# Patient Record
Sex: Male | Born: 1935
Health system: Southern US, Community
[De-identification: ages and names within clinical notes are randomized; demographics above are authoritative.]

## PROBLEM LIST (undated history)

## (undated) DIAGNOSIS — E785 Hyperlipidemia, unspecified: Secondary | ICD-10-CM

## (undated) DIAGNOSIS — I10 Essential (primary) hypertension: Secondary | ICD-10-CM

## (undated) DIAGNOSIS — H353220 Exudative age-related macular degeneration, left eye, stage unspecified: Secondary | ICD-10-CM

## (undated) DIAGNOSIS — M1990 Unspecified osteoarthritis, unspecified site: Secondary | ICD-10-CM

## (undated) DIAGNOSIS — Z01810 Encounter for preprocedural cardiovascular examination: Secondary | ICD-10-CM

## (undated) DIAGNOSIS — I714 Abdominal aortic aneurysm, without rupture, unspecified: Secondary | ICD-10-CM

## (undated) DIAGNOSIS — F528 Other sexual dysfunction not due to a substance or known physiological condition: Secondary | ICD-10-CM

## (undated) DIAGNOSIS — R42 Dizziness and giddiness: Secondary | ICD-10-CM

## (undated) DIAGNOSIS — K219 Gastro-esophageal reflux disease without esophagitis: Secondary | ICD-10-CM

## (undated) DIAGNOSIS — G4700 Insomnia, unspecified: Secondary | ICD-10-CM

## (undated) DIAGNOSIS — L720 Epidermal cyst: Secondary | ICD-10-CM

## (undated) DIAGNOSIS — I2510 Atherosclerotic heart disease of native coronary artery without angina pectoris: Secondary | ICD-10-CM

## (undated) DIAGNOSIS — Z09 Encounter for follow-up examination after completed treatment for conditions other than malignant neoplasm: Secondary | ICD-10-CM

## (undated) DIAGNOSIS — N529 Male erectile dysfunction, unspecified: Secondary | ICD-10-CM

## (undated) DIAGNOSIS — H409 Unspecified glaucoma: Secondary | ICD-10-CM

## (undated) DIAGNOSIS — I5022 Chronic systolic (congestive) heart failure: Secondary | ICD-10-CM

## (undated) DIAGNOSIS — I509 Heart failure, unspecified: Secondary | ICD-10-CM

## (undated) DIAGNOSIS — J189 Pneumonia, unspecified organism: Secondary | ICD-10-CM

## (undated) DIAGNOSIS — I255 Ischemic cardiomyopathy: Secondary | ICD-10-CM

## (undated) DIAGNOSIS — Z9189 Other specified personal risk factors, not elsewhere classified: Secondary | ICD-10-CM

## (undated) DIAGNOSIS — K4090 Unilateral inguinal hernia, without obstruction or gangrene, not specified as recurrent: Secondary | ICD-10-CM

## (undated) DIAGNOSIS — J310 Chronic rhinitis: Secondary | ICD-10-CM

## (undated) DIAGNOSIS — L309 Dermatitis, unspecified: Secondary | ICD-10-CM

## (undated) DIAGNOSIS — I219 Acute myocardial infarction, unspecified: Secondary | ICD-10-CM

## (undated) HISTORY — DX: Dermatitis, unspecified: L30.9

## (undated) HISTORY — PX: PTCA: SHX146

## (undated) HISTORY — DX: Acute myocardial infarction, unspecified: I21.9

## (undated) HISTORY — DX: Heart failure, unspecified: I50.9

## (undated) HISTORY — PX: CARDIAC CATHETERIZATION: SHX172

## (undated) HISTORY — DX: Dizziness and giddiness: R42

## (undated) HISTORY — DX: Atherosclerotic heart disease of native coronary artery without angina pectoris: I25.10

## (undated) HISTORY — DX: Unspecified osteoarthritis, unspecified site: M19.90

## (undated) HISTORY — DX: Hyperlipidemia, unspecified: E78.5

## (undated) HISTORY — DX: Abdominal aortic aneurysm, without rupture: I71.4

## (undated) HISTORY — DX: Unspecified glaucoma: H40.9

## (undated) HISTORY — DX: Gastro-esophageal reflux disease without esophagitis: K21.9

## (undated) HISTORY — DX: Encounter for follow-up examination after completed treatment for conditions other than malignant neoplasm: Z09

## (undated) HISTORY — DX: Other sexual dysfunction not due to a substance or known physiological condition: F52.8

## (undated) HISTORY — DX: Chronic systolic (congestive) heart failure: I50.22

## (undated) HISTORY — DX: Encounter for preprocedural cardiovascular examination: Z01.810

## (undated) HISTORY — DX: Essential (primary) hypertension: I10

## (undated) HISTORY — DX: Male erectile dysfunction, unspecified: N52.9

## (undated) HISTORY — DX: Chronic rhinitis: J31.0

## (undated) HISTORY — DX: Insomnia, unspecified: G47.00

## (undated) HISTORY — DX: Ischemic cardiomyopathy: I25.5

## (undated) HISTORY — DX: Abdominal aortic aneurysm, without rupture, unspecified: I71.40

## (undated) HISTORY — DX: Other specified personal risk factors, not elsewhere classified: Z91.89

## (undated) HISTORY — DX: Unilateral inguinal hernia, without obstruction or gangrene, not specified as recurrent: K40.90

## (undated) HISTORY — PX: CHOLECYSTECTOMY: SHX55

---

## 1983-11-20 HISTORY — PX: INGUINAL HERNIA REPAIR: SUR1180

## 1987-11-20 DIAGNOSIS — I219 Acute myocardial infarction, unspecified: Secondary | ICD-10-CM

## 1987-11-20 HISTORY — DX: Acute myocardial infarction, unspecified: I21.9

## 2001-09-24 ENCOUNTER — Encounter (HOSPITAL_COMMUNITY): Admission: RE | Admit: 2001-09-24 | Discharge: 2001-12-23 | Payer: Self-pay | Admitting: Family Medicine

## 2001-09-25 ENCOUNTER — Encounter: Payer: Self-pay | Admitting: Internal Medicine

## 2001-09-25 ENCOUNTER — Inpatient Hospital Stay (HOSPITAL_COMMUNITY): Admission: EM | Admit: 2001-09-25 | Discharge: 2001-09-30 | Payer: Self-pay | Admitting: Family Medicine

## 2001-09-27 ENCOUNTER — Encounter: Payer: Self-pay | Admitting: Internal Medicine

## 2001-09-29 ENCOUNTER — Encounter: Payer: Self-pay | Admitting: Internal Medicine

## 2001-11-19 HISTORY — PX: OTHER SURGICAL HISTORY: SHX169

## 2001-11-20 ENCOUNTER — Encounter: Payer: Self-pay | Admitting: Family Medicine

## 2001-11-20 ENCOUNTER — Ambulatory Visit (HOSPITAL_COMMUNITY): Admission: RE | Admit: 2001-11-20 | Discharge: 2001-11-20 | Payer: Self-pay | Admitting: Family Medicine

## 2002-01-28 ENCOUNTER — Ambulatory Visit (HOSPITAL_COMMUNITY): Admission: RE | Admit: 2002-01-28 | Discharge: 2002-01-28 | Payer: Self-pay | Admitting: Family Medicine

## 2002-01-28 ENCOUNTER — Encounter: Payer: Self-pay | Admitting: Family Medicine

## 2002-02-23 ENCOUNTER — Ambulatory Visit (HOSPITAL_COMMUNITY): Admission: RE | Admit: 2002-02-23 | Discharge: 2002-02-23 | Payer: Self-pay | Admitting: Vascular Surgery

## 2002-02-23 ENCOUNTER — Encounter: Payer: Self-pay | Admitting: Vascular Surgery

## 2002-02-25 ENCOUNTER — Encounter: Payer: Self-pay | Admitting: Vascular Surgery

## 2002-02-25 ENCOUNTER — Encounter: Admission: RE | Admit: 2002-02-25 | Discharge: 2002-02-25 | Payer: Self-pay | Admitting: Vascular Surgery

## 2002-03-05 ENCOUNTER — Encounter: Admission: RE | Admit: 2002-03-05 | Discharge: 2002-03-05 | Payer: Self-pay | Admitting: Vascular Surgery

## 2002-03-05 ENCOUNTER — Encounter: Payer: Self-pay | Admitting: Vascular Surgery

## 2002-03-17 ENCOUNTER — Inpatient Hospital Stay (HOSPITAL_COMMUNITY): Admission: RE | Admit: 2002-03-17 | Discharge: 2002-03-22 | Payer: Self-pay | Admitting: Vascular Surgery

## 2002-03-17 ENCOUNTER — Encounter: Payer: Self-pay | Admitting: Vascular Surgery

## 2002-03-17 ENCOUNTER — Encounter (INDEPENDENT_AMBULATORY_CARE_PROVIDER_SITE_OTHER): Payer: Self-pay | Admitting: *Deleted

## 2002-03-18 ENCOUNTER — Encounter: Payer: Self-pay | Admitting: Vascular Surgery

## 2004-09-27 ENCOUNTER — Ambulatory Visit: Payer: Self-pay | Admitting: Internal Medicine

## 2004-12-19 ENCOUNTER — Emergency Department (HOSPITAL_COMMUNITY): Admission: EM | Admit: 2004-12-19 | Discharge: 2004-12-19 | Payer: Self-pay | Admitting: Emergency Medicine

## 2004-12-25 ENCOUNTER — Ambulatory Visit: Payer: Self-pay | Admitting: Family Medicine

## 2005-01-01 ENCOUNTER — Ambulatory Visit: Payer: Self-pay | Admitting: Family Medicine

## 2005-01-18 ENCOUNTER — Ambulatory Visit: Payer: Self-pay | Admitting: Family Medicine

## 2005-01-31 ENCOUNTER — Ambulatory Visit: Payer: Self-pay | Admitting: Family Medicine

## 2005-07-11 ENCOUNTER — Ambulatory Visit: Payer: Self-pay | Admitting: Internal Medicine

## 2005-07-20 ENCOUNTER — Emergency Department (HOSPITAL_COMMUNITY): Admission: EM | Admit: 2005-07-20 | Discharge: 2005-07-20 | Payer: Self-pay | Admitting: Emergency Medicine

## 2005-07-25 ENCOUNTER — Ambulatory Visit: Payer: Self-pay | Admitting: Internal Medicine

## 2005-07-27 ENCOUNTER — Ambulatory Visit: Payer: Self-pay | Admitting: Internal Medicine

## 2005-07-30 ENCOUNTER — Ambulatory Visit: Payer: Self-pay | Admitting: Internal Medicine

## 2005-12-10 ENCOUNTER — Ambulatory Visit: Payer: Self-pay | Admitting: Internal Medicine

## 2006-04-22 ENCOUNTER — Ambulatory Visit: Payer: Self-pay | Admitting: Internal Medicine

## 2006-05-02 ENCOUNTER — Encounter: Payer: Self-pay | Admitting: Internal Medicine

## 2006-05-20 ENCOUNTER — Ambulatory Visit: Payer: Self-pay | Admitting: Internal Medicine

## 2006-08-26 ENCOUNTER — Ambulatory Visit: Payer: Self-pay | Admitting: Internal Medicine

## 2006-08-27 ENCOUNTER — Ambulatory Visit: Payer: Self-pay | Admitting: Internal Medicine

## 2006-09-30 ENCOUNTER — Ambulatory Visit: Payer: Self-pay | Admitting: Internal Medicine

## 2006-11-28 ENCOUNTER — Ambulatory Visit: Payer: Self-pay | Admitting: Internal Medicine

## 2006-11-28 LAB — CONVERTED CEMR LAB: PSA: 0.94 ng/mL (ref 0.10–4.00)

## 2007-01-14 ENCOUNTER — Ambulatory Visit: Payer: Self-pay | Admitting: Internal Medicine

## 2007-03-11 ENCOUNTER — Ambulatory Visit: Payer: Self-pay | Admitting: Internal Medicine

## 2007-04-03 ENCOUNTER — Encounter: Payer: Self-pay | Admitting: Internal Medicine

## 2007-04-03 ENCOUNTER — Encounter (INDEPENDENT_AMBULATORY_CARE_PROVIDER_SITE_OTHER): Payer: Self-pay | Admitting: *Deleted

## 2007-04-03 LAB — CONVERTED CEMR LAB
OCCULT 1: NEGATIVE
OCCULT 2: NEGATIVE
OCCULT 3: NEGATIVE

## 2007-04-12 ENCOUNTER — Ambulatory Visit: Payer: Self-pay | Admitting: Internal Medicine

## 2007-04-24 ENCOUNTER — Telehealth (INDEPENDENT_AMBULATORY_CARE_PROVIDER_SITE_OTHER): Payer: Self-pay | Admitting: *Deleted

## 2007-04-30 DIAGNOSIS — I10 Essential (primary) hypertension: Secondary | ICD-10-CM | POA: Insufficient documentation

## 2007-04-30 DIAGNOSIS — I509 Heart failure, unspecified: Secondary | ICD-10-CM | POA: Insufficient documentation

## 2007-04-30 HISTORY — DX: Essential (primary) hypertension: I10

## 2007-05-06 ENCOUNTER — Ambulatory Visit: Payer: Self-pay | Admitting: Internal Medicine

## 2007-05-06 DIAGNOSIS — E785 Hyperlipidemia, unspecified: Secondary | ICD-10-CM

## 2007-05-06 DIAGNOSIS — Z9889 Other specified postprocedural states: Secondary | ICD-10-CM | POA: Insufficient documentation

## 2007-05-06 HISTORY — DX: Hyperlipidemia, unspecified: E78.5

## 2007-06-09 ENCOUNTER — Ambulatory Visit: Payer: Self-pay | Admitting: Internal Medicine

## 2007-06-09 DIAGNOSIS — I2510 Atherosclerotic heart disease of native coronary artery without angina pectoris: Secondary | ICD-10-CM | POA: Insufficient documentation

## 2007-06-09 HISTORY — DX: Atherosclerotic heart disease of native coronary artery without angina pectoris: I25.10

## 2007-06-12 LAB — CONVERTED CEMR LAB
ALT: 18 units/L (ref 0–53)
AST: 22 units/L (ref 0–37)
BUN: 8 mg/dL (ref 6–23)
CO2: 33 meq/L — ABNORMAL HIGH (ref 19–32)
Calcium: 8.8 mg/dL (ref 8.4–10.5)
Chloride: 101 meq/L (ref 96–112)
Cholesterol: 177 mg/dL (ref 0–200)
Creatinine, Ser: 1 mg/dL (ref 0.4–1.5)
Direct LDL: 96.4 mg/dL
GFR calc Af Amer: 93 mL/min
GFR calc non Af Amer: 77 mL/min
Glucose, Bld: 109 mg/dL — ABNORMAL HIGH (ref 70–99)
HDL: 28.6 mg/dL — ABNORMAL LOW (ref >39.0)
Hemoglobin: 14.6 g/dL (ref 13.0–17.0)
Potassium: 3.5 meq/L (ref 3.5–5.1)
Sodium: 140 meq/L (ref 135–145)
Total CHOL/HDL Ratio: 6.2
Triglycerides: 251 mg/dL (ref 0–149)
VLDL: 50 mg/dL — ABNORMAL HIGH (ref 0–40)

## 2007-06-25 ENCOUNTER — Ambulatory Visit: Payer: Self-pay | Admitting: Cardiology

## 2007-06-27 ENCOUNTER — Encounter: Payer: Self-pay | Admitting: Internal Medicine

## 2007-07-23 ENCOUNTER — Ambulatory Visit: Payer: Self-pay

## 2007-07-23 ENCOUNTER — Encounter: Payer: Self-pay | Admitting: Internal Medicine

## 2007-12-09 ENCOUNTER — Ambulatory Visit: Payer: Self-pay | Admitting: Internal Medicine

## 2007-12-09 DIAGNOSIS — F528 Other sexual dysfunction not due to a substance or known physiological condition: Secondary | ICD-10-CM | POA: Insufficient documentation

## 2007-12-09 HISTORY — DX: Other sexual dysfunction not due to a substance or known physiological condition: F52.8

## 2007-12-10 ENCOUNTER — Telehealth (INDEPENDENT_AMBULATORY_CARE_PROVIDER_SITE_OTHER): Payer: Self-pay | Admitting: *Deleted

## 2007-12-10 LAB — CONVERTED CEMR LAB
ALT: 18 units/L (ref 0–53)
AST: 19 units/L (ref 0–37)
BUN: 8 mg/dL (ref 6–23)
Basophils Absolute: 0.1 10*3/uL (ref 0.0–0.1)
Basophils Relative: 1.1 % — ABNORMAL HIGH (ref 0.0–1.0)
CO2: 30 meq/L (ref 19–32)
Calcium: 9.1 mg/dL (ref 8.4–10.5)
Chloride: 98 meq/L (ref 96–112)
Cholesterol: 156 mg/dL (ref 0–200)
Creatinine, Ser: 1.1 mg/dL (ref 0.4–1.5)
Direct LDL: 81.5 mg/dL
Eosinophils Absolute: 0.2 10*3/uL (ref 0.0–0.6)
Eosinophils Relative: 4.7 % (ref 0.0–5.0)
GFR calc Af Amer: 83 mL/min
GFR calc non Af Amer: 68 mL/min
Glucose, Bld: 104 mg/dL — ABNORMAL HIGH (ref 70–99)
HCT: 42.8 % (ref 39.0–52.0)
HDL: 31.3 mg/dL — ABNORMAL LOW (ref >39.0)
Hemoglobin: 14.6 g/dL (ref 13.0–17.0)
Lymphocytes Relative: 30.8 % (ref 12.0–46.0)
MCHC: 34 g/dL (ref 30.0–36.0)
MCV: 95.7 fL (ref 78.0–100.0)
Monocytes Absolute: 0.6 10*3/uL (ref 0.2–0.7)
Monocytes Relative: 12.6 % — ABNORMAL HIGH (ref 3.0–11.0)
Neutro Abs: 2.5 10*3/uL (ref 1.4–7.7)
Neutrophils Relative %: 50.8 % (ref 43.0–77.0)
Platelets: 183 10*3/uL (ref 150–400)
Potassium: 3.5 meq/L (ref 3.5–5.1)
RBC: 4.47 M/uL (ref 4.22–5.81)
RDW: 13.3 % (ref 11.5–14.6)
Sodium: 138 meq/L (ref 135–145)
Total CHOL/HDL Ratio: 5
Triglycerides: 224 mg/dL (ref 0–149)
VLDL: 45 mg/dL — ABNORMAL HIGH (ref 0–40)
WBC: 4.9 10*3/uL (ref 4.5–10.5)

## 2008-03-23 ENCOUNTER — Telehealth: Payer: Self-pay | Admitting: Internal Medicine

## 2008-03-23 ENCOUNTER — Emergency Department (HOSPITAL_COMMUNITY): Admission: EM | Admit: 2008-03-23 | Discharge: 2008-03-23 | Payer: Self-pay | Admitting: Emergency Medicine

## 2008-03-30 ENCOUNTER — Encounter: Admission: RE | Admit: 2008-03-30 | Discharge: 2008-03-30 | Payer: Self-pay | Admitting: Specialist

## 2008-04-20 ENCOUNTER — Ambulatory Visit: Payer: Self-pay | Admitting: Internal Medicine

## 2008-04-20 DIAGNOSIS — J310 Chronic rhinitis: Secondary | ICD-10-CM

## 2008-04-20 DIAGNOSIS — R42 Dizziness and giddiness: Secondary | ICD-10-CM | POA: Insufficient documentation

## 2008-04-20 HISTORY — DX: Chronic rhinitis: J31.0

## 2008-07-15 ENCOUNTER — Ambulatory Visit: Payer: Self-pay | Admitting: Cardiology

## 2008-07-21 ENCOUNTER — Ambulatory Visit: Payer: Self-pay | Admitting: Internal Medicine

## 2008-07-21 DIAGNOSIS — K219 Gastro-esophageal reflux disease without esophagitis: Secondary | ICD-10-CM

## 2008-07-21 HISTORY — DX: Gastro-esophageal reflux disease without esophagitis: K21.9

## 2008-07-23 ENCOUNTER — Telehealth (INDEPENDENT_AMBULATORY_CARE_PROVIDER_SITE_OTHER): Payer: Self-pay | Admitting: *Deleted

## 2008-07-23 LAB — CONVERTED CEMR LAB
ALT: 17 units/L (ref 0–53)
AST: 21 units/L (ref 0–37)
BUN: 9 mg/dL (ref 6–23)
CO2: 31 meq/L (ref 19–32)
Calcium: 9 mg/dL (ref 8.4–10.5)
Chloride: 104 meq/L (ref 96–112)
Cholesterol: 130 mg/dL (ref 0–200)
Creatinine, Ser: 0.9 mg/dL (ref 0.4–1.5)
GFR calc Af Amer: 104 mL/min
GFR calc non Af Amer: 86 mL/min
Glucose, Bld: 101 mg/dL — ABNORMAL HIGH (ref 70–99)
HDL: 29.8 mg/dL — ABNORMAL LOW (ref >39.0)
LDL Cholesterol: 62 mg/dL (ref 0–99)
Potassium: 3.1 meq/L — ABNORMAL LOW (ref 3.5–5.1)
Sodium: 140 meq/L (ref 135–145)
TSH: 1.39 microintl units/mL (ref 0.35–5.50)
Total CHOL/HDL Ratio: 4.4
Triglycerides: 189 mg/dL — ABNORMAL HIGH (ref 0–149)
VLDL: 38 mg/dL (ref 0–40)

## 2008-08-06 ENCOUNTER — Ambulatory Visit: Payer: Self-pay | Admitting: Internal Medicine

## 2008-08-06 ENCOUNTER — Telehealth (INDEPENDENT_AMBULATORY_CARE_PROVIDER_SITE_OTHER): Payer: Self-pay | Admitting: *Deleted

## 2008-08-06 LAB — CONVERTED CEMR LAB: Potassium: 3 meq/L — ABNORMAL LOW (ref 3.5–5.1)

## 2008-08-11 ENCOUNTER — Ambulatory Visit: Payer: Self-pay | Admitting: Internal Medicine

## 2008-08-11 ENCOUNTER — Encounter (INDEPENDENT_AMBULATORY_CARE_PROVIDER_SITE_OTHER): Payer: Self-pay | Admitting: *Deleted

## 2008-08-11 LAB — CONVERTED CEMR LAB
OCCULT 1: NEGATIVE
OCCULT 2: NEGATIVE
OCCULT 3: NEGATIVE

## 2008-09-16 ENCOUNTER — Ambulatory Visit: Payer: Self-pay | Admitting: Internal Medicine

## 2008-09-21 ENCOUNTER — Encounter (INDEPENDENT_AMBULATORY_CARE_PROVIDER_SITE_OTHER): Payer: Self-pay | Admitting: *Deleted

## 2008-09-21 LAB — CONVERTED CEMR LAB: Potassium: 3.4 meq/L — ABNORMAL LOW (ref 3.5–5.1)

## 2008-09-23 ENCOUNTER — Telehealth (INDEPENDENT_AMBULATORY_CARE_PROVIDER_SITE_OTHER): Payer: Self-pay | Admitting: *Deleted

## 2008-11-29 ENCOUNTER — Telehealth: Payer: Self-pay | Admitting: Internal Medicine

## 2009-01-12 ENCOUNTER — Ambulatory Visit: Payer: Self-pay | Admitting: Internal Medicine

## 2009-01-18 LAB — CONVERTED CEMR LAB
BUN: 11 mg/dL (ref 6–23)
Basophils Absolute: 0 10*3/uL (ref 0.0–0.1)
Basophils Relative: 0.5 % (ref 0.0–3.0)
CO2: 30 meq/L (ref 19–32)
Calcium: 9.2 mg/dL (ref 8.4–10.5)
Chloride: 96 meq/L (ref 96–112)
Creatinine, Ser: 0.9 mg/dL (ref 0.4–1.5)
Eosinophils Absolute: 0.2 10*3/uL (ref 0.0–0.7)
Eosinophils Relative: 3.8 % (ref 0.0–5.0)
GFR calc Af Amer: 104 mL/min
GFR calc non Af Amer: 86 mL/min
Glucose, Bld: 92 mg/dL (ref 70–99)
HCT: 41.6 % (ref 39.0–52.0)
Hemoglobin: 14.3 g/dL (ref 13.0–17.0)
Lymphocytes Relative: 32.3 % (ref 12.0–46.0)
MCHC: 34.4 g/dL (ref 30.0–36.0)
MCV: 95.7 fL (ref 78.0–100.0)
Monocytes Absolute: 0.8 10*3/uL (ref 0.1–1.0)
Monocytes Relative: 13.8 % — ABNORMAL HIGH (ref 3.0–12.0)
Neutro Abs: 2.8 10*3/uL (ref 1.4–7.7)
Neutrophils Relative %: 49.6 % (ref 43.0–77.0)
PSA: 0.94 ng/mL (ref 0.10–4.00)
Platelets: 220 10*3/uL (ref 150–400)
Potassium: 3.5 meq/L (ref 3.5–5.1)
RBC: 4.35 M/uL (ref 4.22–5.81)
RDW: 13.4 % (ref 11.5–14.6)
Sodium: 136 meq/L (ref 135–145)
WBC: 5.6 10*3/uL (ref 4.5–10.5)

## 2009-06-10 ENCOUNTER — Encounter (INDEPENDENT_AMBULATORY_CARE_PROVIDER_SITE_OTHER): Payer: Self-pay | Admitting: *Deleted

## 2009-07-07 ENCOUNTER — Ambulatory Visit: Payer: Self-pay | Admitting: Cardiology

## 2009-07-07 DIAGNOSIS — I739 Peripheral vascular disease, unspecified: Secondary | ICD-10-CM | POA: Insufficient documentation

## 2009-07-07 DIAGNOSIS — I714 Abdominal aortic aneurysm, without rupture, unspecified: Secondary | ICD-10-CM | POA: Insufficient documentation

## 2009-07-07 DIAGNOSIS — I252 Old myocardial infarction: Secondary | ICD-10-CM | POA: Insufficient documentation

## 2009-07-12 ENCOUNTER — Ambulatory Visit: Payer: Self-pay | Admitting: Internal Medicine

## 2009-07-18 ENCOUNTER — Ambulatory Visit: Payer: Self-pay | Admitting: Internal Medicine

## 2009-07-22 ENCOUNTER — Ambulatory Visit: Payer: Self-pay

## 2009-07-22 ENCOUNTER — Encounter: Payer: Self-pay | Admitting: Cardiology

## 2009-08-04 LAB — CONVERTED CEMR LAB
ALT: 14 units/L (ref 0–53)
AST: 19 units/L (ref 0–37)
Albumin: 3.8 g/dL (ref 3.5–5.2)
Alkaline Phosphatase: 37 units/L — ABNORMAL LOW (ref 39–117)
Bilirubin, Direct: 0 mg/dL (ref 0.0–0.3)
Cholesterol: 123 mg/dL (ref 0–200)
HDL: 30.2 mg/dL — ABNORMAL LOW (ref >39.00)
LDL Cholesterol: 68 mg/dL (ref 0–99)
Total Bilirubin: 0.8 mg/dL (ref 0.3–1.2)
Total CHOL/HDL Ratio: 4
Total Protein: 6.6 g/dL (ref 6.0–8.3)
Triglycerides: 122 mg/dL (ref 0.0–149.0)
VLDL: 24.4 mg/dL (ref 0.0–40.0)

## 2009-10-05 ENCOUNTER — Telehealth: Payer: Self-pay | Admitting: Internal Medicine

## 2009-11-02 ENCOUNTER — Ambulatory Visit: Payer: Self-pay | Admitting: Internal Medicine

## 2009-11-08 LAB — CONVERTED CEMR LAB
BUN: 13 mg/dL (ref 6–23)
CO2: 30 meq/L (ref 19–32)
Calcium: 8.8 mg/dL (ref 8.4–10.5)
Chloride: 97 meq/L (ref 96–112)
Creatinine, Ser: 1.2 mg/dL (ref 0.4–1.5)
GFR calc non Af Amer: 61.59 mL/min (ref >60)
Glucose, Bld: 81 mg/dL (ref 70–99)
Potassium: 2.9 meq/L — ABNORMAL LOW (ref 3.5–5.1)
Sodium: 136 meq/L (ref 135–145)
Total CK: 123 units/L (ref 7–232)

## 2009-11-22 ENCOUNTER — Telehealth (INDEPENDENT_AMBULATORY_CARE_PROVIDER_SITE_OTHER): Payer: Self-pay | Admitting: *Deleted

## 2009-12-07 ENCOUNTER — Ambulatory Visit: Payer: Self-pay | Admitting: Internal Medicine

## 2009-12-07 LAB — CONVERTED CEMR LAB
BUN: 12 mg/dL (ref 6–23)
CO2: 31 meq/L (ref 19–32)
Calcium: 9.1 mg/dL (ref 8.4–10.5)
Chloride: 100 meq/L (ref 96–112)
Creatinine, Ser: 1.1 mg/dL (ref 0.4–1.5)
GFR calc non Af Amer: 68.08 mL/min (ref >60)
Glucose, Bld: 119 mg/dL — ABNORMAL HIGH (ref 70–99)
Potassium: 2.9 meq/L — ABNORMAL LOW (ref 3.5–5.1)
Sodium: 139 meq/L (ref 135–145)

## 2009-12-13 ENCOUNTER — Telehealth: Payer: Self-pay | Admitting: Internal Medicine

## 2009-12-22 ENCOUNTER — Ambulatory Visit: Payer: Self-pay | Admitting: Internal Medicine

## 2009-12-26 LAB — CONVERTED CEMR LAB
BUN: 8 mg/dL (ref 6–23)
CO2: 28 meq/L (ref 19–32)
Calcium: 8.9 mg/dL (ref 8.4–10.5)
Chloride: 105 meq/L (ref 96–112)
Creatinine, Ser: 1 mg/dL (ref 0.4–1.5)
GFR calc non Af Amer: 75.99 mL/min (ref >60)
Glucose, Bld: 96 mg/dL (ref 70–99)
Potassium: 3.4 meq/L — ABNORMAL LOW (ref 3.5–5.1)
Sodium: 139 meq/L (ref 135–145)

## 2009-12-30 ENCOUNTER — Telehealth (INDEPENDENT_AMBULATORY_CARE_PROVIDER_SITE_OTHER): Payer: Self-pay | Admitting: *Deleted

## 2010-03-17 ENCOUNTER — Ambulatory Visit: Payer: Self-pay | Admitting: Internal Medicine

## 2010-03-17 DIAGNOSIS — M1990 Unspecified osteoarthritis, unspecified site: Secondary | ICD-10-CM

## 2010-03-17 HISTORY — DX: Unspecified osteoarthritis, unspecified site: M19.90

## 2010-03-21 ENCOUNTER — Telehealth (INDEPENDENT_AMBULATORY_CARE_PROVIDER_SITE_OTHER): Payer: Self-pay | Admitting: *Deleted

## 2010-03-22 ENCOUNTER — Encounter: Payer: Self-pay | Admitting: Internal Medicine

## 2010-03-23 ENCOUNTER — Encounter: Payer: Self-pay | Admitting: Internal Medicine

## 2010-03-23 ENCOUNTER — Ambulatory Visit: Payer: Self-pay

## 2010-03-24 ENCOUNTER — Encounter (INDEPENDENT_AMBULATORY_CARE_PROVIDER_SITE_OTHER): Payer: Self-pay | Admitting: *Deleted

## 2010-03-24 ENCOUNTER — Ambulatory Visit: Payer: Self-pay | Admitting: Internal Medicine

## 2010-03-24 LAB — CONVERTED CEMR LAB: Fecal Occult Bld: NEGATIVE

## 2010-03-29 ENCOUNTER — Ambulatory Visit: Payer: Self-pay | Admitting: Cardiology

## 2010-04-03 ENCOUNTER — Telehealth (INDEPENDENT_AMBULATORY_CARE_PROVIDER_SITE_OTHER): Payer: Self-pay | Admitting: Radiology

## 2010-06-01 ENCOUNTER — Telehealth (INDEPENDENT_AMBULATORY_CARE_PROVIDER_SITE_OTHER): Payer: Self-pay | Admitting: *Deleted

## 2010-06-07 ENCOUNTER — Telehealth (INDEPENDENT_AMBULATORY_CARE_PROVIDER_SITE_OTHER): Payer: Self-pay | Admitting: *Deleted

## 2010-06-08 ENCOUNTER — Encounter: Payer: Self-pay | Admitting: Cardiology

## 2010-06-08 ENCOUNTER — Ambulatory Visit: Payer: Self-pay

## 2010-06-08 ENCOUNTER — Ambulatory Visit: Payer: Self-pay | Admitting: Cardiology

## 2010-06-08 ENCOUNTER — Encounter (HOSPITAL_COMMUNITY): Admission: RE | Admit: 2010-06-08 | Discharge: 2010-08-14 | Payer: Self-pay | Admitting: Cardiology

## 2010-07-21 ENCOUNTER — Ambulatory Visit: Payer: Self-pay | Admitting: Internal Medicine

## 2010-07-27 ENCOUNTER — Telehealth (INDEPENDENT_AMBULATORY_CARE_PROVIDER_SITE_OTHER): Payer: Self-pay | Admitting: *Deleted

## 2010-11-22 ENCOUNTER — Other Ambulatory Visit: Payer: Self-pay | Admitting: Internal Medicine

## 2010-11-22 ENCOUNTER — Ambulatory Visit
Admission: RE | Admit: 2010-11-22 | Discharge: 2010-11-22 | Payer: Self-pay | Source: Home / Self Care | Attending: Internal Medicine | Admitting: Internal Medicine

## 2010-11-22 LAB — BASIC METABOLIC PANEL
BUN: 12 mg/dL (ref 6–23)
CO2: 32 mEq/L (ref 19–32)
Calcium: 9 mg/dL (ref 8.4–10.5)
Chloride: 98 mEq/L (ref 96–112)
Creatinine, Ser: 1 mg/dL (ref 0.4–1.5)
GFR: 74.95 mL/min (ref >60.00)
Glucose, Bld: 83 mg/dL (ref 70–99)
Potassium: 2.9 mEq/L — ABNORMAL LOW (ref 3.5–5.1)
Sodium: 138 mEq/L (ref 135–145)

## 2010-11-22 LAB — ALT: ALT: 15 U/L (ref 0–53)

## 2010-11-22 LAB — AST: AST: 17 U/L (ref 0–37)

## 2010-12-14 ENCOUNTER — Telehealth: Payer: Self-pay | Admitting: Internal Medicine

## 2010-12-15 ENCOUNTER — Ambulatory Visit
Admission: RE | Admit: 2010-12-15 | Discharge: 2010-12-15 | Payer: Self-pay | Source: Home / Self Care | Attending: Internal Medicine | Admitting: Internal Medicine

## 2010-12-15 ENCOUNTER — Other Ambulatory Visit: Payer: Self-pay | Admitting: Internal Medicine

## 2010-12-15 LAB — POTASSIUM: Potassium: 3.9 mEq/L (ref 3.5–5.1)

## 2010-12-17 LAB — CONVERTED CEMR LAB
ALT: 17 units/L (ref 0–53)
AST: 19 units/L (ref 0–37)
BUN: 8 mg/dL (ref 6–23)
Basophils Absolute: 0 10*3/uL (ref 0.0–0.1)
Basophils Relative: 0.9 % (ref 0.0–3.0)
CO2: 30 meq/L (ref 19–32)
Calcium: 8.9 mg/dL (ref 8.4–10.5)
Chloride: 105 meq/L (ref 96–112)
Cholesterol: 129 mg/dL (ref 0–200)
Creatinine, Ser: 1 mg/dL (ref 0.4–1.5)
Eosinophils Absolute: 0.2 10*3/uL (ref 0.0–0.7)
Eosinophils Relative: 4.7 % (ref 0.0–5.0)
GFR calc non Af Amer: 75.95 mL/min (ref >60)
Glucose, Bld: 96 mg/dL (ref 70–99)
HCT: 41.2 % (ref 39.0–52.0)
HDL: 36.9 mg/dL — ABNORMAL LOW (ref >39.00)
Hemoglobin: 14.1 g/dL (ref 13.0–17.0)
LDL Cholesterol: 60 mg/dL (ref 0–99)
Lymphocytes Relative: 21.9 % (ref 12.0–46.0)
Lymphs Abs: 1.1 10*3/uL (ref 0.7–4.0)
MCHC: 34.4 g/dL (ref 30.0–36.0)
MCV: 95.7 fL (ref 78.0–100.0)
Monocytes Absolute: 0.5 10*3/uL (ref 0.1–1.0)
Monocytes Relative: 10.5 % (ref 3.0–12.0)
Neutro Abs: 3.2 10*3/uL (ref 1.4–7.7)
Neutrophils Relative %: 62 % (ref 43.0–77.0)
PSA: 1.16 ng/mL (ref 0.10–4.00)
Platelets: 161 10*3/uL (ref 150.0–400.0)
Potassium: 3.9 meq/L (ref 3.5–5.1)
RBC: 4.3 M/uL (ref 4.22–5.81)
RDW: 14.5 % (ref 11.5–14.6)
Sodium: 142 meq/L (ref 135–145)
TSH: 1.91 microintl units/mL (ref 0.35–5.50)
Total CHOL/HDL Ratio: 3
Triglycerides: 161 mg/dL — ABNORMAL HIGH (ref 0.0–149.0)
VLDL: 32.2 mg/dL (ref 0.0–40.0)
WBC: 5.2 10*3/uL (ref 4.5–10.5)

## 2010-12-19 NOTE — Progress Notes (Signed)
Summary: Decklan Mau--rx  LORAZEPAM  Phone Note Refill Request   Refills Requested: Medication #1:  LORAZEPAM 0.5 MG  TABS 1 by mouth at bedtime [facility] [address]-[phone] fax--[phone]--  Initial call taken by: Freddy Jaksch,  November 29, 2008 9:27 AM  Follow-up for Phone Call        last ov 07/21/08, lorazepam on med list do not see where you last filled Follow-up by: Kandice Hams,  November 29, 2008 4:09 PM  Additional Follow-up for Phone Call Additional follow up Details #1::        if he has been on it x long time , ok to RF, 1 month supply, 3 RF Tomie Elko E. Sham Alviar MD  November 29, 2008 4:48 PM     Additional Follow-up for Phone Call Additional follow up Details #2::    SPoke to patient and stated : he had been taking it for sometime now.  Gave refills faxed to pharmacy. Ardyth Man  November 30, 2008 9:31 AM  Follow-up by: Ardyth Man,  November 30, 2008 9:31 AM    Prescriptions: LORAZEPAM 0.5 MG  TABS (LORAZEPAM) 1 by mouth at bedtime  #30 x 3   Entered by:   Ardyth Man   Authorized by:   Nolon Rod. Daylen Lipsky MD   Signed by:   Ardyth Man on 11/30/2008   Method used:   Printed then faxed to ...       [facility]  [phone]* (retail)       [address] [place], [place]  [address]       Ph: [phone] or [phone]       Fax: [phone]   RxID:   [id number]

## 2010-12-19 NOTE — Progress Notes (Signed)
Summary: labs  Phone Note Call from Patient   Caller: Patient-voicemail  Summary of Call: spoke with wife in regards to labs also informed copy mailed to patient  Initial call taken by: Doristine Devoid,  September 23, 2008 5:03 PM      Prescriptions: KLOR-CON M20 20 MEQ CR-TABS (POTASSIUM CHLORIDE CRYS CR) 1 by mouth once daily; recheck potassium in 4 weeks  #30 x 3   Entered by:   Doristine Devoid   Authorized by:   Nolon Rod. Paz MD   Signed by:   Doristine Devoid on 09/23/2008   Method used:   Electronically to        [facility]  [phone] (retail)       [address] [place], [place]  [address]       Ph: [phone] or [phone]       Fax: [phone]   RxID:   [id number]

## 2010-12-19 NOTE — Progress Notes (Signed)
Summary: left msg for pt   Phone Note Call from Patient   Caller: Spouse Summary of Call: called in ref to Klor-Con went to get flled and they are recommending Potassium Chloride, is it ok to use? Left msg for pt to call .Kandice Hams  December 30, 2009 1:08 PM Called pt got vm left msg klor-con, and potassium chloride the same   Initial call taken by: Kandice Hams,  December 30, 2009 1:08 PM

## 2010-12-19 NOTE — Assessment & Plan Note (Signed)
Summary: emp//pt will be fasting//lch   Vital Signs:  Patient profile:   77 year old male Height:      70 inches Weight:      169.8 pounds BMI:     24.45 Pulse rate:   60 / minute BP sitting:   110 / 60  Vitals Entered By: [name] (March 17, 2010 8:44 AM) CC: yearly, fasting, never had colonoscopy Comments Patient is not taing his amitriptyline, levitra, astepro, vicodin BP @ home 121/71, 125/70, 120/68, 113/63 [name]  March 17, 2010 8:51 AM    History of Present Illness: yearly checkup, chart review here with his wife  hyperlipidemia--good medication compliance w/ chol meds   CAD--  2 weeks ago had   CP, on and off,  lasted one day, located @ the anterior, mid chest with some discomfort in the throat. Not associated with shortness of breath, diaphoresis, nausea.  No further episodes.  The pain decreased after burping  Hypertension-- good ambulatory BPs   ED-- not on levitra    hypertension-- his diuretics were changed,   good ambulatory BPs  Preventive Screening-Counseling & Management  Alcohol-Tobacco     Year Quit: 1955  Caffeine-Diet-Exercise     Caffeine use/day: 12     Does Patient Exercise: yes     Type of exercise: walk,     Times/week: 2  Current Medications (verified): 1)  Omeprazole 20 Mg Cpdr (Omeprazole) .... Take 1 Capsule By Mouth At Bedtime 2)  Amlodipine Besylate 10 Mg Tabs (Amlodipine Besylate) .Marland Kitchen.. 1 By Mouth Once Daily 3)  Niaspan 500 Mg  Tbcr (Niacin (Antihyperlipidemic)) .... Take 1 At Bedtime 4)  Amitriptyline Hcl 10 Mg Tabs (Amitriptyline Hcl) .... Take 1 Tablet By Mouth At Bedtime 5)  Lorazepam 0.5 Mg  Tabs (Lorazepam) .Marland Kitchen.. 1 By Mouth At Bedtime 6)  One-Daily Multivitamins   Tabs (Multiple Vitamin) .Marland Kitchen.. 1 Tab Once Daily 7)  Bayer Low Strength 81 Mg  Tbec (Aspirin) .Marland Kitchen.. 1 Tab Once Daily 8)  Fish Oil 1000 Mg Caps (Omega-3 Fatty Acids) .Marland Kitchen.. 1 By Mouth Once Daily 9)  Simvastatin 20 Mg Tabs (Simvastatin) .... 1/2 By  Mouth Once Daily 10)  Ocuvite Preservision  Tabs (Multiple Vitamins-Minerals) .... Daily 11)  Atenolol 100 Mg Tabs (Atenolol) .Marland Kitchen.. 1 By Mouth Once Daily 12)  Furosemide 20 Mg Tabs (Furosemide) .Marland Kitchen.. 1 By Mouth Once Daily - 13)  Klor-Con M20 20 Meq Cr-Tabs (Potassium Chloride Crys Cr) .Marland Kitchen.. 1 By Mouth Once Daily - Stop Spironolactone - Labs Due in 1 Week  Allergies (verified): 1)  ! Spironolactone (Spironolactone) 2)  * Ace Inhibitors Group 3)  Aspirin (Aspirin)  Past History:  Past Medical History: CAD Congestive heart failure    Hypertension AAA-repaired 02/2002,  renal ultrasound, September 2010: Normal abdominal aorta, normal renal arteries Hyperlipidemia ED Allergic rhinitis GERD  Past Surgical History: Reviewed history from 06/18/2009 and no changes required. Cholecystectomy Inguinal herniorrhaphy AAA repair 2003  Family History: Reviewed history from 06/18/2009 and no changes required. Family History Lung cancer prostate cancer -- no heart disease--no diabetes--no colon cancer--no  Social History: Married 3 children, 2 live close by Former Smoker Alcohol use-no  [employer] veteran diet-- healthy exercise-- active if good weather  still drives  independent on his ADLDoes Patient Exercise:  yes Caffeine use/day:  12  Review of Systems Resp:  Denies cough and shortness of breath. GI:  Denies bloody stools, nausea, and vomiting. GU:  Denies dysuria, hematuria, urinary frequency, and urinary  hesitancy. MS:  having pain at the right hip area, ibuprofen p.r.n. helps.  Denies claudication per se.  Physical Exam  General:  alert, well-developed, and well-nourished.   Neck:  slight increase right carotid pulse?, normal left carotid pulse Lungs:  normal respiratory effort, no intercostal retractions, no accessory muscle use, and normal breath sounds.   Heart:  normal rate, regular rhythm, no murmur, and no gallop.   Abdomen:  soft, non-tender, no distention, no masses,  no guarding, and no rigidity.  no bruit Rectal:  large, nonbleeding external hemorrhoids noted. Normal sphincter tone. No rectal masses or tenderness. Hemoccult negative Prostate:  Prostate gland firm and smooth, no enlargement, nodularity, tenderness, mass, asymmetry or induration. Pulses:  good femoral  pulses bilaterally Extremities:  no LE edema   Impression & Recommendations:  Problem # 1:  HYPERTENSION (ICD-401.9)  Chlorthalidone was discontinued due to hypokalemia, he was prescribed   spironolactone but that was discontinued due to rash. Currently on Lasix, tolerating well good ambulatory BPs His updated medication list for this problem includes:    Amlodipine Besylate 10 Mg Tabs (Amlodipine besylate) .Marland Kitchen... 1 by mouth once daily    Atenolol 100 Mg Tabs (Atenolol) .Marland Kitchen... 1 by mouth once daily    Furosemide 20 Mg Tabs (Furosemide) .Marland Kitchen... 1 by mouth once daily -  BP today: 110/60 Prior BP: 100/60 (11/02/2009)  Labs Reviewed: K+: 3.4 (12/22/2009) Creat: : 1.0 (12/22/2009)   Chol: 123 (07/18/2009)   HDL: 30.20 (07/18/2009)   LDL: 68 (07/18/2009)   TG: 122.0 (07/18/2009)  Orders: Venipuncture (04540) TLB-BMP (Basic Metabolic Panel-BMET) (80048-METABOL) TLB-TSH (Thyroid Stimulating Hormone) (84443-TSH) TLB-CBC Platelet - w/Differential (85025-CBCD) EKG w/ Interpretation (93000)  Problem # 2:  HEALTH MAINTENANCE EXAM (ICD-V70.0) chart review. Td 2000 pneumonia shot 2007 printed material provided regards shingles shot   never Cscope Hemoccults negative 9/09 Colonoscopy Vs.iFOB cards reviewed w/ pt. Provided  iFOB but he will  call if he decides to have a  colonoscopy   Check a PSA      Problem # 3:  PERIPHERAL VASCULAR DISEASE (ICD-443.9)  question of decreased  pulse @  right carotid artery check ultrasound  Orders: Cardiology Referral (Cardiology)  Problem # 4:  CORONARY ARTERY DISEASE (ICD-414.00)  chest pain two weeks ago last objective evaluation of his heart  more than two years ago Will discuss with cardiology, likely will need a stress test EKG today no acute  ER if symptoms increase His updated medication list for this problem includes:    Amlodipine Besylate 10 Mg Tabs (Amlodipine besylate) .Marland Kitchen... 1 by mouth once daily    Bayer Low Strength 81 Mg Tbec (Aspirin) .Marland Kitchen... 1 tab once daily    Atenolol 100 Mg Tabs (Atenolol) .Marland Kitchen... 1 by mouth once daily    Furosemide 20 Mg Tabs (Furosemide) .Marland Kitchen... 1 by mouth once daily -  Problem # 5:  CONGESTIVE HEART FAILURE (ICD-428.0) no volume overload today His updated medication list for this problem includes:    Bayer Low Strength 81 Mg Tbec (Aspirin) .Marland Kitchen... 1 tab once daily    Atenolol 100 Mg Tabs (Atenolol) .Marland Kitchen... 1 by mouth once daily    Furosemide 20 Mg Tabs (Furosemide) .Marland Kitchen... 1 by mouth once daily -  Problem # 6:  DEGENERATIVE JOINT DISEASE (ICD-715.90) the patient has some pain around the right hip  he is taking ibuprofen, recommend to take Tylenol instead The following medications were removed from the medication list:    Vicodin 5-500 Mg Tabs (Hydrocodone-acetaminophen) .Marland KitchenMarland KitchenMarland KitchenMarland Kitchen 1  every 4 hours His updated medication list for this problem includes:    Bayer Low Strength 81 Mg Tbec (Aspirin) .Marland Kitchen... 1 tab once daily  Problem # 7:  HYPERLIPIDEMIA (ICD-272.4) labs  The following medications were removed from the medication list:    Lopid 600 Mg Tabs (Gemfibrozil) .Marland Kitchen... 1 tablet by mouth twice a day His updated medication list for this problem includes:    Niaspan 500 Mg Tbcr (Niacin (antihyperlipidemic)) .Marland Kitchen... Take 1 at bedtime    Simvastatin 10 Mg Tabs (Simvastatin) .Marland Kitchen... 1 by mouth once daily  Orders: TLB-ALT (SGPT) (84460-ALT) TLB-AST (SGOT) (84450-SGOT) TLB-Lipid Panel (80061-LIPID)  Labs Reviewed: SGOT: 19 (07/18/2009)   SGPT: 14 (07/18/2009)   HDL:30.20 (07/18/2009), 29.8 (07/21/2008)  LDL:68 (07/18/2009), 62 (07/21/2008)  Chol:123 (07/18/2009), 130 (07/21/2008)  Trig:122.0 (07/18/2009), 189  (07/21/2008)  Complete Medication List: 1)  Omeprazole 20 Mg Cpdr (Omeprazole) .... Take 1 capsule by mouth at bedtime 2)  Amlodipine Besylate 10 Mg Tabs (Amlodipine besylate) .Marland Kitchen.. 1 by mouth once daily 3)  Niaspan 500 Mg Tbcr (Niacin (antihyperlipidemic)) .... Take 1 at bedtime 4)  Amitriptyline Hcl 10 Mg Tabs (Amitriptyline hcl) .... Take 1 tablet by mouth at bedtime 5)  Lorazepam 0.5 Mg Tabs (Lorazepam) .Marland Kitchen.. 1 by mouth at bedtime 6)  One-daily Multivitamins Tabs (Multiple vitamin) .Marland Kitchen.. 1 tab once daily 7)  Bayer Low Strength 81 Mg Tbec (Aspirin) .Marland Kitchen.. 1 tab once daily 8)  Fish Oil 1000 Mg Caps (Omega-3 fatty acids) .Marland Kitchen.. 1 by mouth once daily 9)  Simvastatin 10 Mg Tabs (Simvastatin) .Marland Kitchen.. 1 by mouth once daily 10)  Ocuvite Preservision Tabs (Multiple vitamins-minerals) .... Daily 11)  Atenolol 100 Mg Tabs (Atenolol) .Marland Kitchen.. 1 by mouth once daily 12)  Furosemide 20 Mg Tabs (Furosemide) .Marland Kitchen.. 1 by mouth once daily - 13)  Klor-con M20 20 Meq Cr-tabs (Potassium chloride crys cr) .Marland Kitchen.. 1 by mouth once daily - stop spironolactone - labs due in 1 week  Other Orders: TLB-PSA (Prostate Specific Antigen) (84153-PSA)  Patient Instructions: 1)  instead of ibuprofen ,  take Tylenol 500 mg one or two tablets every 6 hours as needed for pain 2)  Please schedule a follow-up appointment in 4 months .  Prescriptions: SIMVASTATIN 10 MG TABS (SIMVASTATIN) 1 by mouth once daily  #90 x 3   Entered and Authorized by:   Elita Quick E. Arbor Leer MD   Signed by:   Nolon Rod. Biannca Scantlin MD on 03/17/2010   Method used:   Print then Give to Patient   RxID:   [id number]    Preventive Care Screening  Prior Values:    PSA:  0.94 (01/12/2009)    Last Tetanus Booster:  Historical (10/26/1999)    Last Flu Shot:  Fluvax 3+ (11/02/2009)    Last Pneumovax:  Pneumovax (08/19/2006)    Past Medical History:    Reviewed history from 11/02/2009 and no changes required:       CAD       Congestive heart failure          Hypertension        AAA-repaired 02/2002,  renal ultrasound, September 2010: Normal abdominal aorta, normal renal arteries       Hyperlipidemia       ED       Allergic rhinitis       GERD  Past Surgical History:    Reviewed history from 06/18/2009 and no changes required:       Cholecystectomy       Inguinal herniorrhaphy  AAA repair 2003    Risk Factors:  Tobacco use:  quit    Year quit:  1955 Caffeine use:  12 drinks per day Alcohol use:  no Exercise:  yes    Times per week:  2    Type:  walk,   Appended Document: emp//pt will be fasting//lch to be seen by cardiology next week

## 2010-12-19 NOTE — Assessment & Plan Note (Signed)
Summary: 3 MONTH OV//PH   Vital Signs:  Patient profile:   77 year old male Height:      70 inches (177.80 cm) Weight:      168.13 pounds (76.42 kg) BP sitting:   100 / 60  Vitals Entered By: Kandice Hams (November 02, 2009 12:41 PM) CC: 3 MONTH FOLLOWUP. NEED REFILL OF SIMVASTATIN Flu Vaccine Consent Questions     Do you have a history of severe allergic reactions to this vaccine? no    Any prior history of allergic reactions to egg and/or gelatin? no    Do you have a sensitivity to the preservative Thimersol? no    Do you have a past history of Guillan-Barre Syndrome? no    Do you currently have an acute febrile illness? no    Have you ever had a severe reaction to latex? no    Vaccine information given and explained to patient? yes    Are you currently pregnant? no    Lot Number:[id number]   Exp Date:05/18/2010   Site Given  right Deltoid IM   History of Present Illness: ROV, doing well, needs a RF on simva no flu shot yet          Allergies: 1)  * Ace Inhibitors Group 2)  Aspirin (Aspirin)  Past History:  Past Medical History: CAD Congestive heart failure    Hypertension AAA-repaired 02/2002,  renal ultrasound, September 2010: Normal abdominal aorta, normal renal arteries Hyperlipidemia ED Allergic rhinitis GERD  Past Surgical History: Reviewed history from 06/18/2009 and no changes required. Cholecystectomy Inguinal herniorrhaphy AAA repair 2003  Social History: Reviewed history from 06/18/2009 and no changes required. Married Former Smoker Alcohol use-no   [employer] veteran  Review of Systems       CAD-- no CP SOB Congestive heart failure-- no edema  Hypertension-- ambulatory BPs  WNL, BP low today but feels very well          MS:  R hip pain few weeks ago, resolved .  Physical Exam  General:  alert and well-developed.   Lungs:  normal respiratory effort, no intercostal retractions, no accessory muscle use, and normal breath sounds.   Heart:   normal rate, regular rhythm, no murmur, and no gallop.   Extremities:  no pretibial edema bilaterally    Impression & Recommendations:  Problem # 1:  HYPERTENSION (ICD-401.9)  at goal , slightly  low today but asx  advised to call if > 140/85 < 110/60  (written instructions provided) His updated medication list for this problem includes:    Amlodipine Besylate 10 Mg Tabs (Amlodipine besylate) .Marland Kitchen... 1 by mouth once daily    Atenolol-chlorthalidone 100-25 Mg Tabs (Atenolol-chlorthalidone) .Marland Kitchen... Take 1 tablet by mouth once a day  BP today: 100/60 Prior BP: 120/80 (07/12/2009)  Labs Reviewed: K+: 3.5 (01/12/2009) Creat: : 0.9 (01/12/2009)   Chol: 123 (07/18/2009)   HDL: 30.20 (07/18/2009)   LDL: 68 (07/18/2009)   TG: 122.0 (07/18/2009)  Orders: Venipuncture (40981) TLB-BMP (Basic Metabolic Panel-BMET) (80048-METABOL)  Problem # 2:  HYPERLIPIDEMIA (ICD-272.4)  on triple therapy, tolerates well  check CKs although there is no myalgias  His updated medication list for this problem includes:    Niaspan 500 Mg Tbcr (Niacin (antihyperlipidemic)) .Marland Kitchen... Take 1 at bedtime    Lopid 600 Mg Tabs (Gemfibrozil) .Marland Kitchen... 1 tablet by mouth twice a day    Simvastatin 20 Mg Tabs (Simvastatin) .Marland Kitchen... 1/2 by mouth once daily  Orders: TLB-CK Total Only(Creatine Kinase/CPK) (82550-CK)  Problem # 3:  HEALTH MAINTENANCE EXAM (ICD-V70.0) flu shot today last PSA neg  (12-2008) due for yearly, see instructions   Complete Medication List: 1)  Omeprazole 20 Mg Cpdr (Omeprazole) .... Take 1 capsule by mouth at bedtime 2)  Amlodipine Besylate 10 Mg Tabs (Amlodipine besylate) .Marland Kitchen.. 1 by mouth once daily 3)  Atenolol-chlorthalidone 100-25 Mg Tabs (Atenolol-chlorthalidone) .... Take 1 tablet by mouth once a day 4)  Niaspan 500 Mg Tbcr (Niacin (antihyperlipidemic)) .... Take 1 at bedtime 5)  Lopid 600 Mg Tabs (Gemfibrozil) .Marland Kitchen.. 1 tablet by mouth twice a day 6)  Amitriptyline Hcl 10 Mg Tabs (Amitriptyline hcl)  .... Take 1 tablet by mouth at bedtime 7)  Lorazepam 0.5 Mg Tabs (Lorazepam) .Marland Kitchen.. 1 by mouth at bedtime 8)  One-daily Multivitamins Tabs (Multiple vitamin) .Marland Kitchen.. 1 tab once daily 9)  Bayer Low Strength 81 Mg Tbec (Aspirin) .Marland Kitchen.. 1 tab once daily 10)  Astepro 137 Mcg/spray Soln (Azelastine hcl) .... 2 puffs two times a day 11)  Levitra 20 Mg Tabs (Vardenafil hcl) .... 1/2 to 1 by mouth once daily 12)  Klor-con M20 20 Meq Cr-tabs (Potassium chloride crys cr) .Marland Kitchen.. 1 by mouth once daily; recheck potassium in 4 weeks 13)  Clotrimazole-betamethasone 1-0.05 % Crea (Clotrimazole-betamethasone) .... Apply two times a day x 10 days 14)  Fish Oil 1000 Mg Caps (Omega-3 fatty acids) .Marland Kitchen.. 1 by mouth once daily 15)  Simvastatin 20 Mg Tabs (Simvastatin) .... 1/2 by mouth once daily 16)  Ocuvite Preservision Tabs (Multiple vitamins-minerals) .... Daily 17)  Vicodin 5-500 Mg Tabs (Hydrocodone-acetaminophen) .Marland Kitchen.. 1 every 4 hours  Other Orders: Flu Vaccine 14yrs + (66440) Administration Flu vaccine - MCR (H4742)  Patient Instructions: 1)  Please schedule a follow-up appointment in 4 months (fasting-yearly) Prescriptions: SIMVASTATIN 20 MG TABS (SIMVASTATIN) 1/2 by mouth once daily  #45 x 1   Entered by:   Kandice Hams   Authorized by:   Nolon Rod. Lucita Montoya MD   Signed by:   Kandice Hams on 11/02/2009   Method used:   Print then Give to Patient   RxID:   [id number]    ANTICOAGULATION RECORD       NEW REGIMEN & LAB RESULTS Regimen:   (no change)  MEDICATIONS OMEPRAZOLE 20 MG CPDR (OMEPRAZOLE) Take 1 capsule by mouth at bedtime AMLODIPINE BESYLATE 10 MG TABS (AMLODIPINE BESYLATE) 1 by mouth once daily ATENOLOL-CHLORTHALIDONE 100-25 MG TABS (ATENOLOL-CHLORTHALIDONE) Take 1 tablet by mouth once a day NIASPAN 500 MG  TBCR (NIACIN (ANTIHYPERLIPIDEMIC)) take 1 at bedtime LOPID 600 MG TABS (GEMFIBROZIL) 1 tablet by mouth twice a day AMITRIPTYLINE HCL 10 MG TABS (AMITRIPTYLINE HCL) Take 1 tablet by mouth  at bedtime LORAZEPAM 0.5 MG  TABS (LORAZEPAM) 1 by mouth at bedtime ONE-DAILY MULTIVITAMINS   TABS (MULTIPLE VITAMIN) 1 tab once daily BAYER LOW STRENGTH 81 MG  TBEC (ASPIRIN) 1 tab once daily ASTEPRO 137 MCG/SPRAY SOLN (AZELASTINE HCL) 2 puffs two times a day LEVITRA 20 MG TABS (VARDENAFIL HCL) 1/2 to 1 by mouth once daily KLOR-CON M20 20 MEQ CR-TABS (POTASSIUM CHLORIDE CRYS CR) 1 by mouth once daily; recheck potassium in 4 weeks CLOTRIMAZOLE-BETAMETHASONE 1-0.05 % CREA (CLOTRIMAZOLE-BETAMETHASONE) apply two times a day x 10 days FISH OIL 1000 MG CAPS (OMEGA-3 FATTY ACIDS) 1 by mouth once daily SIMVASTATIN 20 MG TABS (SIMVASTATIN) 1/2 by mouth once daily OCUVITE PRESERVISION  TABS (MULTIPLE VITAMINS-MINERALS) daily VICODIN 5-500 MG TABS (HYDROCODONE-ACETAMINOPHEN) 1 EVERY 4 HOURS

## 2010-12-19 NOTE — Assessment & Plan Note (Signed)
Summary: CAD/ANAS  Medications Added OMEGA-3 350 MG  CAPS (OMEGA-3 FATTY ACIDS) 1 cap once daily ONE-DAILY MULTIVITAMINS   TABS (MULTIPLE VITAMIN) 1 tab once daily BAYER LOW STRENGTH 81 MG  TBEC (ASPIRIN) 1 tab once daily        Visit Type:  rov Primary Provider:  Dr. Drue Novel  CC:  no cardiac complaints today.  History of Present Illness: [name] returns today for followup of his history of coronary artery disease, status post remote inferior Alyse Kathan infarct with subsequent angioplasty, mild left ventricular dysfunction, history of abdominal aortic aneurysm repair.  He's been doing remarkably well. He says he walks about a mile has to mouth every day. His wife says he could keep walking much further that she does. He has no chest discomfort or angina. He's had no dyspnea on exertion.  He denies any back pain or abdominal pain. He's had no claudication. He is very compliant with his medications.  Current Medications (verified): 1)  Omeprazole 20 Mg Cpdr (Omeprazole) .... Take 1 Capsule By Mouth At Bedtime 2)  Felodipine 10 Mg Tb24 (Felodipine) .... Take 1 Tablet By Mouth Once A Day 3)  Atenolol-Chlorthalidone 100-25 Mg Tabs (Atenolol-Chlorthalidone) .... Take 1 Tablet By Mouth Once A Day 4)  Niaspan 500 Mg  Tbcr (Niacin (Antihyperlipidemic)) .... Take 1 At Bedtime 5)  Lopid 600 Mg Tabs (Gemfibrozil) .Marland Kitchen.. 1 Tablet By Mouth Twice A Day 6)  Omega-3 350 Mg  Caps (Omega-3 Fatty Acids) .Marland Kitchen.. 1 Cap Once Daily 7)  Amitriptyline Hcl 10 Mg Tabs (Amitriptyline Hcl) .... Take 1 Tablet By Mouth At Bedtime 8)  Lorazepam 0.5 Mg  Tabs (Lorazepam) .Marland Kitchen.. 1 By Mouth At Bedtime 9)  One-Daily Multivitamins   Tabs (Multiple Vitamin) .Marland Kitchen.. 1 Tab Once Daily 10)  Bayer Low Strength 81 Mg  Tbec (Aspirin) .Marland Kitchen.. 1 Tab Once Daily 11)  Astepro 137 Mcg/spray Soln (Azelastine Hcl) .... 2 Puffs Two Times A Day 12)  Levitra 20 Mg Tabs (Vardenafil Hcl) .... 1/2 To 1 By Mouth Once Daily 13)  Klor-Con M20 20 Meq Cr-Tabs  (Potassium Chloride Crys Cr) .Marland Kitchen.. 1 By Mouth Once Daily; Recheck Potassium in 4 Weeks  Allergies: 1)  * Ace Inhibitors Group 2)  Aspirin (Aspirin)  Past History:  Past Medical History: Last updated: 06/18/2009 AAA-repaired 02/2002 HX, PERSONAL, MAJOR CARDIOVASCULAR SURGERY (ICD-V15.1) CORONARY ARTERY DISEASE (ICD-414.00) CONGESTIVE HEART FAILURE (ICD-428.0) HYPERTENSION (ICD-401.9) HYPERLIPIDEMIA (ICD-272.4) DIZZINESS (ICD-780.4) GERD (ICD-530.81) SPECIAL SCREENING MALIGNANT NEOPLASM OF PROSTATE (ICD-V76.44) ALLERGIC RHINITIS (ICD-477.9) ERECTILE DYSFUNCTION (ICD-302.72) HEALTH MAINTENANCE EXAM (ICD-V70.0)    Past Surgical History: Last updated: 06/18/2009 Cholecystectomy Inguinal herniorrhaphy AAA repair 2003  Family History: Last updated: 06/18/2009 Family History Lung cancer prostate cancer -- no heart disease--no diabetes--no colon cancer--no  Social History: Last updated: 06/18/2009 Married Former Smoker Alcohol use-no   [employer] veteran  Risk Factors: Smoking Status: quit (04/30/2007)  Review of Systems       negative other than the history of present illness  Vital Signs:  Patient profile:   77 year old male Height:      70 inches Weight:      170 pounds BMI:     24.48 Pulse rate:   64 / minute Pulse rhythm:   regular BP sitting:   112 / 70  (left arm) Cuff size:   large  Vitals Entered By: Danielle Rankin, CMA (July 07, 2009 3:01 PM)  Physical Exam  General:  Well developed, well nourished, in no acute distress. Head:  normocephalic  and atraumatic Neck:  Neck supple, no JVD. No masses, thyromegaly or abnormal cervical nodes. Chest Serrina Minogue:  no deformities or breast masses noted Lungs:  Clear bilaterally to auscultation and percussion. Heart:  Non-displaced PMI, chest non-tender; regular rate and rhythm, S1, S2 without murmurs, rubs or gallops. Carotid upstroke normal, no bruit. Normal abdominal aortic size, no bruits. Femorals normal pulses, no  bruits. Pedals normal pulses. No edema, no varicosities. Abdomen:  incision intact, no tenderness, good bowel sounds, no bruit Msk:  decreased ROM.   Pulses:  good pulses in his lower extremity Extremities:  no edema, varicosities, no sign of DVT Neurologic:  Alert and oriented x 3. Skin:  Intact without lesions or rashes. Psych:  Normal affect.   Problems:  Medical Problems Added: 1)  Dx of Peripheral Vascular Disease  (ICD-443.9) 2)  Dx of Myocardial Infarction, Hx of  (ICD-412) 3)  Dx of Abdominal Aortic Aneurysm  (ICD-441.4)  EKG  Procedure date:  07/07/2009  Findings:      normal sinus rhythm, old inferior Kobyn Kray infarct, mild interventricular conduction delay, no change  Impression & Recommendations:  Problem # 1:  CORONARY ARTERY DISEASE (ICD-414.00) Assessment Unchanged  His updated medication list for this problem includes:    Felodipine 10 Mg Tb24 (Felodipine) .Marland Kitchen... Take 1 tablet by mouth once a day    Atenolol-chlorthalidone 100-25 Mg Tabs (Atenolol-chlorthalidone) .Marland Kitchen... Take 1 tablet by mouth once a day    Bayer Low Strength 81 Mg Tbec (Aspirin) .Marland Kitchen... 1 tab once daily  Problem # 2:  MYOCARDIAL INFARCTION, HX OF (ICD-412) Assessment: Unchanged  His updated medication list for this problem includes:    Felodipine 10 Mg Tb24 (Felodipine) .Marland Kitchen... Take 1 tablet by mouth once a day    Atenolol-chlorthalidone 100-25 Mg Tabs (Atenolol-chlorthalidone) .Marland Kitchen... Take 1 tablet by mouth once a day    Bayer Low Strength 81 Mg Tbec (Aspirin) .Marland Kitchen... 1 tab once daily  Orders: EKG w/ Interpretation (93000)  Problem # 3:  ABDOMINAL AORTIC ANEURYSM (ICD-441.4) Assessment: Unchanged we'll obtain an abdominal aortic ultrasound and also look at his renal blood flow.  Other Orders: Renal Artery Duplex (Renal Artery Duplex) Abdominal Aorta Duplex (Abd Aorta Duplex)  Patient Instructions: 1)  Your physician has requested that you have an abdominal aorta duplex. During this test, an  ultrasound is used to evaluate the aorta. Allow 30 minutes for this exam. Do not eat after midnight the day before and avoid carbonated beverages. There are no restrictions or special instructions. 2)  Your physician has requested that you have a renal artery duplex. During this test, an ultrasound is used to evaluate blood flow to the kidneys. Allow one hour for this exam. Do not eat after midnight the day before and avoid carbonated beverages. Take your medications as you usually do. 3)  Your physician wants you to follow-up in: 12 MONTHS.  You will receive a reminder letter in the mail two months in advance. If you don't receive a letter, please call our office to schedule the follow-up appointment.

## 2010-12-19 NOTE — Progress Notes (Signed)
Summary: lab results 12/09/07  Phone Note Outgoing Call   Details for Reason: lab results 12/09/07 Summary of Call: SEE APPEND pt aware of lab results and to continue on niaspan ..................................................................Marland KitchenShary Decamp  December 10, 2007 10:42 AM

## 2010-12-19 NOTE — Miscellaneous (Signed)
Summary: med list updated   Patient brought in bottles of current medications -- med list updated [name]  July 18, 2009 9:12 AM  Appended Document: med list updated Medications Added SIMVASTATIN 20 MG TABS (SIMVASTATIN) 1/2 by mouth once daily OCUVITE PRESERVISION  TABS (MULTIPLE VITAMINS-MINERALS) daily          Clinical Lists Changes  Medications: Added new medication of SIMVASTATIN 20 MG TABS (SIMVASTATIN) 1/2 by mouth once daily Added new medication of OCUVITE PRESERVISION  TABS (MULTIPLE VITAMINS-MINERALS) daily

## 2010-12-19 NOTE — Assessment & Plan Note (Signed)
Summary: rov/chest pain/jml  Medications Added GEMFIBROZIL 600 MG TABS (GEMFIBROZIL) 1 tab once daily        Visit Type:  rov  CC:  pt states he had some cp 3 weeks ago..denies any edema or sob....  Current Medications (verified): 1)  Omeprazole 20 Mg Cpdr (Omeprazole) .... Take 1 Capsule By Mouth At Bedtime 2)  Amlodipine Besylate 10 Mg Tabs (Amlodipine Besylate) .Marland Kitchen.. 1 By Mouth Once Daily 3)  Niaspan 500 Mg  Tbcr (Niacin (Antihyperlipidemic)) .... Take 1 At Bedtime 4)  Amitriptyline Hcl 10 Mg Tabs (Amitriptyline Hcl) .... Take 1 Tablet By Mouth At Bedtime 5)  One-Daily Multivitamins   Tabs (Multiple Vitamin) .Marland Kitchen.. 1 Tab Once Daily 6)  Bayer Low Strength 81 Mg  Tbec (Aspirin) .Marland Kitchen.. 1 Tab Once Daily 7)  Fish Oil 1000 Mg Caps (Omega-3 Fatty Acids) .Marland Kitchen.. 1 By Mouth Once Daily 8)  Simvastatin 10 Mg Tabs (Simvastatin) .Marland Kitchen.. 1 By Mouth Once Daily 9)  Ocuvite Preservision  Tabs (Multiple Vitamins-Minerals) .... Daily 10)  Atenolol 100 Mg Tabs (Atenolol) .Marland Kitchen.. 1 By Mouth Once Daily 11)  Furosemide 20 Mg Tabs (Furosemide) .Marland Kitchen.. 1 By Mouth Once Daily - 12)  Klor-Con M20 20 Meq Cr-Tabs (Potassium Chloride Crys Cr) .Marland Kitchen.. 1 By Mouth Once Daily - Stop Spironolactone - Labs Due in 1 Week 13)  Gemfibrozil 600 Mg Tabs (Gemfibrozil) .Marland Kitchen.. 1 Tab Once Daily  Allergies: 1)  ! Spironolactone (Spironolactone) 2)  * Ace Inhibitors Group 3)  Aspirin (Aspirin)  Past History:  Past Medical History: Last updated: 03/17/2010 CAD Congestive heart failure    Hypertension AAA-repaired 02/2002,  renal ultrasound, September 2010: Normal abdominal aorta, normal renal arteries Hyperlipidemia ED Allergic rhinitis GERD  Past Surgical History: Last updated: 06/18/2009 Cholecystectomy Inguinal herniorrhaphy AAA repair 2003  Family History: Last updated: 06/18/2009 Family History Lung cancer prostate cancer -- no heart disease--no diabetes--no colon cancer--no  Social History: Last updated:  03/17/2010 Married 3 children, 2 live close by Former Smoker Alcohol use-no  [employer] veteran diet-- healthy exercise-- active if good weather  still drives  independent on his ADL  Risk Factors: Caffeine Use: 12 (03/17/2010) Exercise: yes (03/17/2010)  Risk Factors: Smoking Status: quit (04/30/2007)  Review of Systems       negative other than history of present illness  Vital Signs:  Patient profile:   77 year old male Height:      70 inches Weight:      172 pounds BMI:     24.77 Pulse rate:   62 / minute Pulse rhythm:   irregular BP sitting:   116 / 60  (left arm) Cuff size:   large  Vitals Entered By: Danielle Rankin, CMA (Mar 29, 2010 3:20 PM)  Physical Exam  General:  Well developed, well nourished, in no acute distress. Head:  normocephalic and atraumatic Eyes:  PERRLA/EOM intact; conjunctiva and lids normal. Neck:  Neck supple, no JVD. No masses, thyromegaly or abnormal cervical nodes. Chest Kellye Mizner:  no deformities or breast masses noted Lungs:  Clear bilaterally to auscultation and percussion. Heart:  Non-displaced PMI, chest non-tender; regular rate and rhythm, S1, S2 without murmurs, rubs or gallops. Carotid upstroke normal, no bruit. Normal abdominal aortic size, no bruits. Femorals normal pulses, no bruits. Pedals normal pulses. No edema, no varicosities. Abdomen:  Bowel sounds positive; abdomen soft and non-tender without masses, organomegaly, or hernias noted. No hepatosplenomegaly. Msk:  decreased ROM.   Pulses:  pulses normal in all 4 extremities Extremities:  No  clubbing or cyanosis. Neurologic:  Alert and oriented x 3. Skin:  Intact without lesions or rashes. Psych:  Normal affect.   Problems:  Medical Problems Added: 1)  Dx of Chest Pain-precordial  (ZOX-096.04)  EKG  Procedure date:  03/29/2010  Findings:      normal sinus rhythm, ST segment changes anterolaterally, no acute changes.  EKG  Procedure date:  03/29/2010  Findings:       normal sinus rhythm, old inferior lateral Saiya Crist MI, incomplete right bundle, no change  Impression & Recommendations:  Problem # 1:  CHEST PAIN-PRECORDIAL (ICD-786.51) I have suggested and will obtain a exercise stress Myoview. With his history of coronary artery disease, old inferior lateral Niya Behler MI, and new onset precordial discomfort we need to make sure he has no significant ischemia. He and his wife agree with the plan His updated medication list for this problem includes:    Amlodipine Besylate 10 Mg Tabs (Amlodipine besylate) .Marland Kitchen... 1 by mouth once daily    Bayer Low Strength 81 Mg Tbec (Aspirin) .Marland Kitchen... 1 tab once daily    Atenolol 100 Mg Tabs (Atenolol) .Marland Kitchen... 1 by mouth once daily  Problem # 2:  MYOCARDIAL INFARCTION, HX OF (ICD-412) Assessment: Unchanged  His updated medication list for this problem includes:    Amlodipine Besylate 10 Mg Tabs (Amlodipine besylate) .Marland Kitchen... 1 by mouth once daily    Bayer Low Strength 81 Mg Tbec (Aspirin) .Marland Kitchen... 1 tab once daily    Atenolol 100 Mg Tabs (Atenolol) .Marland Kitchen... 1 by mouth once daily  Problem # 3:  CORONARY ARTERY DISEASE (ICD-414.00)  His updated medication list for this problem includes:    Amlodipine Besylate 10 Mg Tabs (Amlodipine besylate) .Marland Kitchen... 1 by mouth once daily    Bayer Low Strength 81 Mg Tbec (Aspirin) .Marland Kitchen... 1 tab once daily    Atenolol 100 Mg Tabs (Atenolol) .Marland Kitchen... 1 by mouth once daily  Problem # 4:  ABDOMINAL AORTIC ANEURYSM (ICD-441.4) Assessment: Improved  Problem # 5:  CAROTID BRUIT (ICD-785.9) Assessment: Unchanged He Has not affected disease. I reviewed the carotid Doppler with the patient and his wife.  Other Orders: EKG w/ Interpretation (93000) Nuclear Stress Test (Nuc Stress Test)  Patient Instructions: 1)  Your physician recommends that you schedule a follow-up appointment in: YEAR WITH DR Dannica Bickham 2)  Your physician recommends that you continue on your current medications as directed. Please refer to the Current  Medication list given to you today. 3)  Your physician has requested that you have an exercise stress myoview.  For further information please visit [email or web address]  Please follow instruction sheet, as given.

## 2010-12-19 NOTE — Letter (Signed)
Summary: Results Follow up Letter  [place] at [facility]  [address] [place], [place] [address]   Phone: [phone]  Fax: [phone]    09/21/2008 MRN: [id number]  Christopher Hogan [address] [place], [place]  [address]  Dear [name],  The following are the results of your recent test(s):  Test         Result    Pap Smear:        Normal _____  Not Normal _____ Comments: ______________________________________________________ Cholesterol: LDL(Bad cholesterol):         Your goal is less than:         HDL (Good cholesterol):       Your goal is more than: Comments:  ______________________________________________________ Mammogram:        Normal _____  Not Normal _____ Comments:  ___________________________________________________________________ Hemoccult:        Normal _____  Not normal _______ Comments:    _____________________________________________________________________ Other Tests:  see attached labs from 09/16/08  We routinely do not discuss normal results over the telephone.  If you desire a copy of the results, or you have any questions about this information we can discuss them at your next office visit.   Sincerely,

## 2010-12-19 NOTE — Progress Notes (Signed)
   Phone Note Outgoing Call   Call placed by: Scherrie Bateman, LPN,  June 01, 2010 9:58 AM Call placed to: Patient Summary of Call: LEFT MESSAGE FOR PT TO CALL BACK NEED TO RESCHEDULE STRESS MYOVIEW. Initial call taken by: Scherrie Bateman, LPN,  June 01, 2010 9:59 AM  Follow-up for Phone Call        PT CALLED BACK MYOVIEW RESCHEDULED FOR 06/08/10 AT 9:30. Follow-up by: Scherrie Bateman, LPN,  June 01, 2010 4:47 PM

## 2010-12-19 NOTE — Assessment & Plan Note (Signed)
Summary: RTO 6 MONTHS/CBS   Vital Signs:  Patient profile:   77 year old male Height:      70 inches Weight:      170 pounds Pulse rate:   60 / minute Pulse rhythm:   regular BP sitting:   120 / 80  (left arm) Cuff size:   regular  Vitals Entered By: [name] (July 12, 2009 1:01 PM) CC: rov Comments -[facility] -- pt wants to know if he needs to take it ??mg -check spot on rt ear, rt leg [name]  July 12, 2009 1:02 PM    History of Present Illness: ROV     here w/  wife   CAD-- note from Dr Daleen Squibb reviewed, stable Hypertension-- ambulatory BPs  "always good" AAA-repaired 02/2002, Card ordered a u/s (pending) Hyperlipidemia-- [facility] in addition to his regular meds  (I did not know he was on it) ,does not know x how long he is taking simva or strenght Allergic rhinitis, still some symptoms despite astepro  skin lesion at R leg, severe itching , OTC antifungal helps some    Current Medications (verified): 1)  Omeprazole 20 Mg Cpdr (Omeprazole) .... Take 1 Capsule By Mouth At Bedtime 2)  Felodipine 10 Mg Tb24 (Felodipine) .... Take 1 Tablet By Mouth Once A Day 3)  Atenolol-Chlorthalidone 100-25 Mg Tabs (Atenolol-Chlorthalidone) .... Take 1 Tablet By Mouth Once A Day 4)  Niaspan 500 Mg  Tbcr (Niacin (Antihyperlipidemic)) .... Take 1 At Bedtime 5)  Lopid 600 Mg Tabs (Gemfibrozil) .Marland Kitchen.. 1 Tablet By Mouth Twice A Day 6)  Omega-3 350 Mg  Caps (Omega-3 Fatty Acids) .Marland Kitchen.. 1 Cap Once Daily 7)  Amitriptyline Hcl 10 Mg Tabs (Amitriptyline Hcl) .... Take 1 Tablet By Mouth At Bedtime 8)  Lorazepam 0.5 Mg  Tabs (Lorazepam) .Marland Kitchen.. 1 By Mouth At Bedtime 9)  One-Daily Multivitamins   Tabs (Multiple Vitamin) .Marland Kitchen.. 1 Tab Once Daily 10)  Bayer Low Strength 81 Mg  Tbec (Aspirin) .Marland Kitchen.. 1 Tab Once Daily 11)  Astepro 137 Mcg/spray Soln (Azelastine Hcl) .... 2 Puffs Two Times A Day 12)  Levitra 20 Mg Tabs (Vardenafil Hcl) .... 1/2 To 1 By Mouth Once Daily  13)  Klor-Con M20 20 Meq Cr-Tabs (Potassium Chloride Crys Cr) .Marland Kitchen.. 1 By Mouth Once Daily; Recheck Potassium in 4 Weeks  Allergies (verified): 1)  * Ace Inhibitors Group 2)  Aspirin (Aspirin)  Past History:  Past Medical History:       CAD       Congestive heart failure       Hypertension       AAA-repaired 02/2002       Hyperlipidemia       ED       Allergic rhinitis       GERD  Social History: Reviewed history from 06/18/2009 and no changes required. Married Former Smoker Alcohol use-no   [employer] veteran  Review of Systems CV:  Denies chest pain or discomfort, shortness of breath with exertion, and swelling of feet. GU:  Denies hematuria, urinary frequency, and urinary hesitancy. MS:  occasionally leg cramps .  Physical Exam  General:  alert and well-developed.   Lungs:  Clear bilaterally to auscultation and percussion. Heart:  normal rate, regular rhythm, no murmur, and no gallop.   Extremities:  trace B edema   Skin:  2 mm papular red-slt scally lesion at inner distal R leg  behind R  ear, mild erythema w/o a lesion perse    Impression & Recommendations:  Problem # 1:  SKIN LESION (ICD-709.9) Assessment New see cream Rx to call if no better   Problem # 2:  CORONARY ARTERY DISEASE (ICD-414.00) asx His updated medication list for this problem includes:    Felodipine 10 Mg Tb24 (Felodipine) .Marland Kitchen... Take 1 tablet by mouth once a day    Atenolol-chlorthalidone 100-25 Mg Tabs (Atenolol-chlorthalidone) .Marland Kitchen... Take 1 tablet by mouth once a day    Bayer Low Strength 81 Mg Tbec (Aspirin) .Marland Kitchen... 1 tab once daily  Problem # 3:  HYPERTENSION (ICD-401.9) at goal  His updated medication list for this problem includes:    Felodipine 10 Mg Tb24 (Felodipine) .Marland Kitchen... Take 1 tablet by mouth once a day    Atenolol-chlorthalidone 100-25 Mg Tabs (Atenolol-chlorthalidone) .Marland Kitchen... Take 1 tablet by mouth once a day  BP today: 120/80 Prior BP: 112/70 (07/07/2009)  Labs Reviewed: K+: 3.5  (01/12/2009) Creat: : 0.9 (01/12/2009)   Chol: 130 (07/21/2008)   HDL: 29.8 (07/21/2008)   LDL: 62 (07/21/2008)   TG: 189 (07/21/2008)  Problem # 4:  HYPERLIPIDEMIA (ICD-272.4) on simvastatin in addition to listed meds: dose ? for how long on simva? plan: LFTs - FLP patient will bring bottles for med reconciliation  His updated medication list for this problem includes:    Niaspan 500 Mg Tbcr (Niacin (antihyperlipidemic)) .Marland Kitchen... Take 1 at bedtime    Lopid 600 Mg Tabs (Gemfibrozil) .Marland Kitchen... 1 tablet by mouth twice a day  Labs Reviewed: SGOT: 21 (07/21/2008)   SGPT: 17 (07/21/2008)   HDL:29.8 (07/21/2008), 31.3 (12/09/2007)  LDL:62 (07/21/2008), DEL (04/54/0981)  Chol:130 (07/21/2008), 156 (12/09/2007)  Trig:189 (07/21/2008), 224 (12/09/2007)  Complete Medication List: 1)  Omeprazole 20 Mg Cpdr (Omeprazole) .... Take 1 capsule by mouth at bedtime 2)  Felodipine 10 Mg Tb24 (Felodipine) .... Take 1 tablet by mouth once a day 3)  Atenolol-chlorthalidone 100-25 Mg Tabs (Atenolol-chlorthalidone) .... Take 1 tablet by mouth once a day 4)  Niaspan 500 Mg Tbcr (Niacin (antihyperlipidemic)) .... Take 1 at bedtime 5)  Lopid 600 Mg Tabs (Gemfibrozil) .Marland Kitchen.. 1 tablet by mouth twice a day 6)  Omega-3 350 Mg Caps (Omega-3 fatty acids) .Marland Kitchen.. 1 cap once daily 7)  Amitriptyline Hcl 10 Mg Tabs (Amitriptyline hcl) .... Take 1 tablet by mouth at bedtime 8)  Lorazepam 0.5 Mg Tabs (Lorazepam) .Marland Kitchen.. 1 by mouth at bedtime 9)  One-daily Multivitamins Tabs (Multiple vitamin) .Marland Kitchen.. 1 tab once daily 10)  Bayer Low Strength 81 Mg Tbec (Aspirin) .Marland Kitchen.. 1 tab once daily 11)  Astepro 137 Mcg/spray Soln (Azelastine hcl) .... 2 puffs two times a day 12)  Levitra 20 Mg Tabs (Vardenafil hcl) .... 1/2 to 1 by mouth once daily 13)  Klor-con M20 20 Meq Cr-tabs (Potassium chloride crys cr) .Marland Kitchen.. 1 by mouth once daily; recheck potassium in 4 weeks 14)  Clotrimazole-betamethasone 1-0.05 % Crea (Clotrimazole-betamethasone) .... Apply two  times a day x 10 days  Patient Instructions: 1)  came back fasting: 2)  FLP LFTs ---->  Dx high cholesterol  3)  bring all your meds with you and let my nurse review them  4)  Please schedule a follow-up appointment in 3 months .  Prescriptions: CLOTRIMAZOLE-BETAMETHASONE 1-0.05 % CREA (CLOTRIMAZOLE-BETAMETHASONE) apply two times a day x 10 days  #1 x 0   Entered and Authorized by:   Nolon Rod. Kilee Hedding MD   Signed by:   Nolon Rod. Marquite Attwood MD on 07/12/2009  Method used:   Printed then faxed to ...       [facility]  [phone]* (retail)       [address] [place], [place]  [address]       Ph: [phone] or [phone]       Fax: [phone]   RxID:   [id number] ATENOLOL-CHLORTHALIDONE 100-25 MG TABS (ATENOLOL-CHLORTHALIDONE) Take 1 tablet by mouth once a day  #90 x 3   Entered and Authorized by:   Nolon Rod. Dior Stepter MD   Signed by:   Nolon Rod. Alydia Gosser MD on 07/12/2009   Method used:   Printed then faxed to ...       [facility]  [phone]* (retail)       [address] [place], [place]  [address]       Ph: [phone] or [phone]       Fax: [phone]   RxID:   [id number]

## 2010-12-19 NOTE — Letter (Signed)
Summary: Results Follow up Letter  [place] at [facility]  [address] [place], [place] [address]   Phone: [phone]  Fax: [phone]    08/11/2008 MRN: [id number]  Christopher Hogan [address] [place], [place]  [address]  Dear Mr. Rhodus,  The following are the results of your recent test(s):  Test         Result    Pap Smear:        Normal _____  Not Normal _____ Comments: ______________________________________________________ Cholesterol: LDL(Bad cholesterol):         Your goal is less than:         HDL (Good cholesterol):       Your goal is more than: Comments:  ______________________________________________________ Mammogram:        Normal _____  Not Normal _____ Comments:  ___________________________________________________________________ Hemoccult:        Normal ___x__  Not normal _______ Comments:    _____________________________________________________________________ Other Tests:    We routinely do not discuss normal results over the telephone.  If you desire a copy of the results, or you have any questions about this information we can discuss them at your next office visit.   Sincerely,

## 2010-12-19 NOTE — Progress Notes (Signed)
Summary: Nuclear pre procedure  Phone Note Outgoing Call Call back at [facility] Phone [phone]   Call placed by: Rea College, CMA,  June 07, 2010 2:44 PM Call placed to: Patient Summary of Call: Reviewed information on Myoview Information Sheet (see scanned document for further details).  Spoke with patient.      Nuclear Med Background Indications for Stress Test: Evaluation for Ischemia, PTCA Patency   History: Abnormal EKG, Angioplasty, Echo, Myocardial Infarction, Myocardial Perfusion Study  History Comments: '89 IWMI>PTCA-RCA; '02 Stress Echo:inferior infarct/ischemia; '03 AAA repair; '08 YNW:GNFAOZHY infero-lateral wall infarct from base to apex, no ischemia, EF=48%; h/o CHF  Symptoms: Chest Pain    Nuclear Pre-Procedure Cardiac Risk Factors: Carotid Disease, History of Smoking, Hypertension, Lipids, PVD Height (in): 70

## 2010-12-19 NOTE — Assessment & Plan Note (Signed)
Summary: yearly--PH   Vital Signs:  Patient Profile:   77 Years Old Male Weight:      168 pounds Pulse rate:   82 / minute BP sitting:   100 / 60  Vitals Entered By: [name] (July 21, 2008 10:21 AM)                 Chief Complaint:  YEARLY - FASTING.  History of Present Illness: yearly feeling well       CAD-- saw Dr Daleen Squibb, his note is reviewed,stable. Had a EKG       Congestive heart failure-- stable       Hypertension-- ambulatory BP "normal"  ?readings       Hyperlipidemia-- good compliance w/meds        Allergic rhinitis-- still PN drip, used astepro and helped (will need RF)       GERD-- sx well control w/  omeprazole    Prior Medication List:  OMEPRAZOLE 20 MG CPDR (OMEPRAZOLE) Take 1 capsule by mouth at bedtime FELODIPINE 10 MG TB24 (FELODIPINE) Take 1 tablet by mouth once a day ATENOLOL-CHLORTHALIDONE 100-25 MG TABS (ATENOLOL-CHLORTHALIDONE) Take 1 tablet by mouth once a day NIASPAN 500 MG  TBCR (NIACIN (ANTIHYPERLIPIDEMIC)) take 1 at bedtime LOPID 600 MG TABS (GEMFIBROZIL) 1 tablet by mouth twice a day OMEGA-3 350 MG  CAPS (OMEGA-3 FATTY ACIDS)  AMITRIPTYLINE HCL 10 MG TABS (AMITRIPTYLINE HCL) Take 1 tablet by mouth at bedtime LORAZEPAM 0.5 MG  TABS (LORAZEPAM) 1 by mouth at bedtime ONE-DAILY MULTIVITAMINS   TABS (MULTIPLE VITAMIN)  BAYER LOW STRENGTH 81 MG  TBEC (ASPIRIN)  VIAGRA 100 MG  TABS (SILDENAFIL CITRATE) 1/2 to 1 by mouth once daily as needed   Current Allergies (reviewed today): * ACE INHIBITORS GROUP ASPIRIN (ASPIRIN)  Past Medical History:    Reviewed history from 04/20/2008 and no changes required:       CAD       Congestive heart failure       Hypertension       AAA-repaired 02/2002       Hyperlipidemia       ED       Allergic rhinitis       GERD  Past Surgical History:    Reviewed history from 05/06/2007 and no changes required:       Cholecystectomy       Inguinal herniorrhaphy       AAA repair 2003   Family History:     Reviewed history from 06/09/2007 and no changes required:       Family History Lung cancer       prostate cancer -- no       heart disease--no       diabetes--no       colon cancer--no  Social History:    Reviewed history from 04/30/2007 and no changes required:       Married       Former Smoker       Alcohol use-no         [employer] veteran    Review of Systems  CV      Denies chest pain or discomfort and swelling of feet.  Resp      Denies cough and shortness of breath.  GI      Denies bloody stools, diarrhea, and nausea.      h/o hemorrhoids--"no bleed in years"  GU      Denies hematuria and urinary hesitancy.  Physical Exam  General:     alert, well-developed, and well-nourished.   Neck:     no thyromegaly and normal carotid upstroke.   Lungs:     normal respiratory effort, no intercostal retractions, no accessory muscle use, and normal breath sounds.   Heart:     normal rate, regular rhythm, and no murmur.   Abdomen:     soft, non-tender, no hepatomegaly, and no splenomegaly.   Rectal:     + external hemorrhoids, soft , no bleed. Normal sphincter tone. No rectal masses or tenderness. hemocult neg Prostate:     Prostate gland firm and smooth, no enlargement, nodularity, tenderness, mass, asymmetry or induration. Extremities:     no pretibial edema bilaterally     Impression & Recommendations:  Problem # 1:  GERD (ICD-530.81) sx well control His updated medication list for this problem includes:    Omeprazole 20 Mg Cpdr (Omeprazole) .Marland Kitchen... Take 1 capsule by mouth at bedtime   Problem # 2:  HEALTH MAINTENANCE EXAM (ICD-V70.0) chart review. Hemoccults on May 2008, never Cscope Colonoscopy Vs. Hemocult cards: reviewed w/ pt. Provided hemocults in case he decide to do them, otherwise, he will call and ask for a colonoscopy  TD 2000 pneumonia shot  10- 2005 flu shot encouraged January 2008--PSA at that time was 0.94    Orders: T-PSA Total  ([address])   Problem # 3:  HYPERTENSION (ICD-401.9) stable His updated medication list for this problem includes:    Felodipine 10 Mg Tb24 (Felodipine) .Marland Kitchen... Take 1 tablet by mouth once a day    Atenolol-chlorthalidone 100-25 Mg Tabs (Atenolol-chlorthalidone) .Marland Kitchen... Take 1 tablet by mouth once a day  BP today: 100/60 Prior BP: 110/64 (04/20/2008)  Labs Reviewed: Creat: 1.1 (12/09/2007) Chol: 156 (12/09/2007)   HDL: 31.3 (12/09/2007)   LDL: DEL (12/09/2007)   TG: 224 (12/09/2007)   Problem # 4:  CORONARY ARTERY DISEASE (ICD-414.00) asx His updated medication list for this problem includes:    Felodipine 10 Mg Tb24 (Felodipine) .Marland Kitchen... Take 1 tablet by mouth once a day    Atenolol-chlorthalidone 100-25 Mg Tabs (Atenolol-chlorthalidone) .Marland Kitchen... Take 1 tablet by mouth once a day    Bayer Low Strength 81 Mg Tbec (Aspirin)  Labs Reviewed: Chol: 156 (12/09/2007)   HDL: 31.3 (12/09/2007)   LDL: DEL (12/09/2007)   TG: 224 (12/09/2007)   Problem # 5:  HYPERLIPIDEMIA (ICD-272.4) labs His updated medication list for this problem includes:    Niaspan 500 Mg Tbcr (Niacin (antihyperlipidemic)) .Marland Kitchen... Take 1 at bedtime    Lopid 600 Mg Tabs (Gemfibrozil) .Marland Kitchen... 1 tablet by mouth twice a day  Orders: TLB-Lipid Panel (80061-LIPID) TLB-ALT (SGPT) (84460-ALT) TLB-AST (SGOT) (84450-SGOT) TLB-TSH (Thyroid Stimulating Hormone) (84443-TSH)  Labs Reviewed: Chol: 156 (12/09/2007)   HDL: 31.3 (12/09/2007)   LDL: DEL (12/09/2007)   TG: 224 (12/09/2007) SGOT: 19 (12/09/2007)   SGPT: 18 (12/09/2007)   Problem # 6:  ALLERGIC RHINITIS (ICD-477.9) astepro His updated medication list for this problem includes:    Astepro 137 Mcg/spray Soln (Azelastine hcl) .Marland Kitchen... 2 puffs two times a day   Problem # 7:  ERECTILE DYSFUNCTION (ICD-302.72) likes to try a different med: levitra , same precautions The following medications were removed from the medication list:    Viagra 100 Mg Tabs (Sildenafil citrate) .Marland Kitchen...  1/2 to 1 by mouth once daily as needed  His updated medication list for this problem includes:    Levitra 20 Mg Tabs (Vardenafil hcl) .Marland Kitchen... 1/2 to 1 by mouth  once daily   Complete Medication List: 1)  Omeprazole 20 Mg Cpdr (Omeprazole) .... Take 1 capsule by mouth at bedtime 2)  Felodipine 10 Mg Tb24 (Felodipine) .... Take 1 tablet by mouth once a day 3)  Atenolol-chlorthalidone 100-25 Mg Tabs (Atenolol-chlorthalidone) .... Take 1 tablet by mouth once a day 4)  Niaspan 500 Mg Tbcr (Niacin (antihyperlipidemic)) .... Take 1 at bedtime 5)  Lopid 600 Mg Tabs (Gemfibrozil) .Marland Kitchen.. 1 tablet by mouth twice a day 6)  Omega-3 350 Mg Caps (Omega-3 fatty acids) 7)  Amitriptyline Hcl 10 Mg Tabs (Amitriptyline hcl) .... Take 1 tablet by mouth at bedtime 8)  Lorazepam 0.5 Mg Tabs (Lorazepam) .Marland Kitchen.. 1 by mouth at bedtime 9)  One-daily Multivitamins Tabs (Multiple vitamin) 10)  Bayer Low Strength 81 Mg Tbec (Aspirin) 11)  Astepro 137 Mcg/spray Soln (Azelastine hcl) .... 2 puffs two times a day 12)  Levitra 20 Mg Tabs (Vardenafil hcl) .... 1/2 to 1 by mouth once daily  Other Orders: Venipuncture (27253) TLB-BMP (Basic Metabolic Panel-BMET) (80048-METABOL)   Patient Instructions: 1)  Please schedule a follow-up appointment in 6 months.   Prescriptions: LEVITRA 20 MG TABS (VARDENAFIL HCL) 1/2 to 1 by mouth once daily  #3 x 3   Entered and Authorized by:   Elita Quick E. Esker Dever MD   Signed by:   Nolon Rod. Paulett Kaufhold MD on 07/21/2008   Method used:   Print then Give to Patient   RxID:   [id number] ASTEPRO 137 MCG/SPRAY SOLN (AZELASTINE HCL) 2 puffs two times a day  #1 x 12   Entered and Authorized by:   Elita Quick E. Wladyslawa Disbro MD   Signed by:   Nolon Rod. Tyrone Pautsch MD on 07/21/2008   Method used:   Print then Give to Patient   RxID:   [id number]  ]

## 2010-12-19 NOTE — Letter (Signed)
Summary: Results Follow up Letter  [facility] [place]  [address] [place], [place] [address]   Phone: [phone]  Fax: [phone]    04/03/2007 MRN: [id number]  [address] [place], [place]  [address]  Dear Mr. Klonowski,  The following are the results of your recent test(s):  Test         Result    Pap Smear:        Normal _____  Not Normal _____ Comments: ______________________________________________________ Cholesterol: LDL(Bad cholesterol):         Your goal is less than:         HDL (Good cholesterol):       Your goal is more than: Comments:  ______________________________________________________ Mammogram:        Normal _____  Not Normal _____ Comments:  ___________________________________________________________________ Hemoccult:        Normal ___x__  Not normal _______ Comments:   all stool cards were negative for blood _____________________________________________________________________ Other Tests:    We routinely do not discuss normal results over the telephone.  If you desire a copy of the results, or you have any questions about this information we can discuss them at your next office visit.   Sincerely,

## 2010-12-19 NOTE — Assessment & Plan Note (Signed)
Summary: Cardiology Nuclear Testing  Nuclear Med Background Indications for Stress Test: Evaluation for Ischemia, PTCA Patency   History: Abnormal EKG, Angioplasty, Echo, Myocardial Infarction, Myocardial Perfusion Study  History Comments: '89 IWMI>PTCA-RCA; '02 Stress Echo:inferior infarct/ischemia; '03 AAA repair; '08 [id number] infero-lateral wall infarct from base to apex, no ischemia, EF=48%; h/o CHF  Symptoms: Chest Pain, Chest Pressure, Palpitations    Nuclear Pre-Procedure Cardiac Risk Factors: Carotid Disease, History of Smoking, Hypertension, Lipids, PVD Caffeine/Decaff Intake: None NPO After: 8:00 PM Lungs: clear IV 0.9% NS with Angio Cath: 18g     IV Site: (R) AC IV Started by: Stanton Kidney EMT-P Chest Size (in) 42     Height (in): 70 Weight (lb): 166 BMI: 23.90 Tech Comments: Atenolol held approx. 24 hours, per patient.  Nuclear Med Study 1 or 2 day study:  1 day     Stress Test Type:  Stress Reading MD:  Marca Ancona, MD     Referring MD:  T.Wall Resting Radionuclide:  Technetium 76m Tetrofosmin     Resting Radionuclide Dose:  10.5 mCi  Stress Radionuclide:  Technetium 61m Tetrofosmin     Stress Radionuclide Dose:  33.0 mCi   Stress Protocol Exercise Time (min):  3:45 min     Max HR:  134 bpm     Predicted Max HR:  138 bpm  Max Systolic BP: 165 mm Hg     Percent Max HR:  97.10 %     METS: 5.5 Rate Pressure Product:  98119    Stress Test Technologist:  Milana Na EMT-P     Nuclear Technologist:  Harlow Asa CNMT  Rest Procedure  Myocardial perfusion imaging was performed at rest 45 minutes following the intravenous administration of Myoview Technetium 7m Tetrofosmin.  Stress Procedure  The patient exercised for 3:45.  The patient stopped due to fatigue and denied any chest pain.  There were non specific ST-T wave changes.  Myoview was injected at peak exercise and myocardial perfusion imaging was performed after a brief delay.  QPS Raw Data  Images:  Normal; no motion artifact; normal heart/lung ratio. Stress Images:  Basal to mid inferior and inferolateral perfusion defect.  Rest Images:  Basal to mid inferior and inferolateral perfusion defect.  Subtraction (SDS):  Fixed basal to mid inferior and inferolateral perfusion defect.  Transient Ischemic Dilatation:  .93  (Normal <1.22)  Lung/Heart Ratio:  .31  (Normal <0.45)  Quantitative Gated Spect Images QGS EDV:  112 ml QGS ESV:  64 ml QGS EF:  43 % QGS cine images:  Inferior, inferolateral, inferoseptal hypokinesis   Overall Impression  Exercise Capacity: Poor exercise capacity. BP Response: Normal blood pressure response. Clinical Symptoms: Fatigue, shortness of breath, no chest pain.  ECG Impression: Old inferior MI, possible old posterior MI.  No changes with exercise.  Overall Impression: Fixed basal to mid inferior and inferolateral perfusion defect suggests prior infarction. No significant ischemia. Overall Impression Comments: EF mildly reduced with inferior, inferolateral, and inferoseptal hypokinesis.   Appended Document: Cardiology Nuclear Testing stable, no change in meds  Appended Document: Cardiology Nuclear Testing patient aware of results.  Lisabeth Devoid Rn

## 2010-12-19 NOTE — Progress Notes (Signed)
Summary: shoulder pain/FYI ED  Phone Note Call from Patient Call back at Home Phone [phone]   Caller: Patient Reason for Call: Acute Illness Summary of Call: dr. Drue Novel patient feel on his right shoulder and is in some pain.  Initial call taken by: Charolette Child,  Mar 23, 2008 11:46 AM  Follow-up for Phone Call        SPOKE WITH PT WHO SAID FELL 30 MIN AGO ON  lLEFT SHOULDER AND IS IN A LOT OF PAIN RECOMMEND PT TO GO TO ED FOR ASSESSMENT AND XRAYS PT AGREED.Kandice Hams  Mar 23, 2008 11:55 AM  Follow-up by: Kandice Hams,  Mar 23, 2008 11:56 AM  Additional Follow-up for Phone Call Additional follow up Details #1::        noted.Elita Quick E. Fortino Haag MD  Mar 23, 2008 1:01 PM

## 2010-12-19 NOTE — Miscellaneous (Signed)
Summary: Orders Update  Clinical Lists Changes  Problems: Added new problem of CAROTID BRUIT (ICD-785.9) Orders: Added new Test order of Carotid Duplex (Carotid Duplex) - Signed 

## 2010-12-19 NOTE — Assessment & Plan Note (Signed)
Summary: rto 4 months/cbs   Vital Signs:  Patient profile:   77 year old male Weight:      167.25 pounds Pulse rate:   64 / minute Pulse rhythm:   regular BP sitting:   122 / 78  (left arm) Cuff size:   large  Vitals Entered By: Army Fossa CMA (July 21, 2010 9:49 AM) CC: Pt here for 4 month f/u: fasting Comments pharm- [facility]   History of Present Illness:  routine office visit, here with his wife, he is doing well  Current Medications (verified): 1)  Omeprazole 20 Mg Cpdr (Omeprazole) .... Take 1 Capsule By Mouth At Bedtime 2)  Amlodipine Besylate 10 Mg Tabs (Amlodipine Besylate) .Marland Kitchen.. 1 By Mouth Once Daily 3)  Niaspan 500 Mg  Tbcr (Niacin (Antihyperlipidemic)) .... Take 1 At Bedtime 4)  Amitriptyline Hcl 10 Mg Tabs (Amitriptyline Hcl) .... Take 1 Tablet By Mouth At Bedtime 5)  One-Daily Multivitamins   Tabs (Multiple Vitamin) .Marland Kitchen.. 1 Tab Once Daily 6)  Bayer Low Strength 81 Mg  Tbec (Aspirin) .Marland Kitchen.. 1 Tab Once Daily 7)  Fish Oil 1000 Mg Caps (Omega-3 Fatty Acids) .Marland Kitchen.. 1 By Mouth Once Daily 8)  Simvastatin 10 Mg Tabs (Simvastatin) .Marland Kitchen.. 1 By Mouth Once Daily 9)  Ocuvite Preservision  Tabs (Multiple Vitamins-Minerals) .... Daily 10)  Atenolol 100 Mg Tabs (Atenolol) .Marland Kitchen.. 1 By Mouth Once Daily 11)  Furosemide 20 Mg Tabs (Furosemide) .Marland Kitchen.. 1 By Mouth Once Daily - 12)  Klor-Con M20 20 Meq Cr-Tabs (Potassium Chloride Crys Cr) .Marland Kitchen.. 1 By Mouth Once Daily - Stop Spironolactone - Labs Due in 1 Week 13)  Gemfibrozil 600 Mg Tabs (Gemfibrozil) .Marland Kitchen.. 1 Tab Once Daily  Allergies: 1)  ! Spironolactone (Spironolactone) 2)  * Ace Inhibitors Group 3)  Aspirin (Aspirin)  Past History:  Past Medical History: CAD--- stable stress test 7-11 Congestive heart failure    Hypertension AAA-repaired 02/2002,  renal ultrasound, September 2010: Normal abdominal aorta, normal renal arteries Hyperlipidemia ED Allergic rhinitis GERD decreased right carotid pulse?.......carotid  ultrasound 5-11----0 to  39% bilaterally  Past Surgical History: Reviewed history from 06/18/2009 and no changes required. Cholecystectomy Inguinal herniorrhaphy AAA repair 2003  Social History: Reviewed history from 03/17/2010 and no changes required. Married 3 children, 2 live close by Former Smoker Alcohol use-no  [employer] veteran diet-- healthy exercise-- active if good weather  still drives  independent on his ADL  Review of Systems       chart is reviewed, since his last office visit he had a carotid ultrasound and stress test. Both were okay no chest pain, shortness of breath, lower extremity edema Ambulatory BPs around 120/80 Several months ago had a single episode of confusion when he woke up, no associated headache, slurred speech. He was not taking new medicines Complaining of chronic (years) runny nose, clear, causing some throat irritation. Prescribed sprays did not work  Physical Exam  General:  alert and well-developed.   Nose:  slightly congested Lungs:  normal respiratory effort, no intercostal retractions, no accessory muscle use, and normal breath sounds.   Heart:  normal rate, regular rhythm, no murmur, and no gallop.   Extremities:  no edema Neurologic:  alert & oriented X3, strength normal in all extremities, and gait normal.   Psych:  Oriented X3, memory intact for recent and remote, not anxious appearing, and not depressed appearing.     Impression & Recommendations:  Problem # 1:  CORONARY ARTERY DISEASE (ICD-414.00) asymptomatic, recent  stress test negative His updated medication list for this problem includes:    Amlodipine Besylate 10 Mg Tabs (Amlodipine besylate) .Marland Kitchen... 1 by mouth once daily    Bayer Low Strength 81 Mg Tbec (Aspirin) .Marland Kitchen... 2 tab once daily    Atenolol 100 Mg Tabs (Atenolol) .Marland Kitchen... 1 by mouth once daily    Furosemide 20 Mg Tabs (Furosemide) .Marland Kitchen... 1 by mouth once daily -  Problem # 2:  CONGESTIVE HEART FAILURE (ICD-428.0) no  evidence of volume overload His updated medication list for this problem includes:    Bayer Low Strength 81 Mg Tbec (Aspirin) .Marland Kitchen... 2 tab once daily    Atenolol 100 Mg Tabs (Atenolol) .Marland Kitchen... 1 by mouth once daily    Furosemide 20 Mg Tabs (Furosemide) .Marland Kitchen... 1 by mouth once daily -  Problem # 3:  HYPERLIPIDEMIA (ICD-272.4) well controlled, recommend diet His updated medication list for this problem includes:    Niaspan 500 Mg Tbcr (Niacin (antihyperlipidemic)) .Marland Kitchen... Take 1 at bedtime    Simvastatin 10 Mg Tabs (Simvastatin) .Marland Kitchen... 1 by mouth once daily    Gemfibrozil 600 Mg Tabs (Gemfibrozil) .Marland Kitchen... 1 tab once daily  Problem # 4:  ALLERGIC RHINITIS (ICD-477.9)  see review of systems, trial with   His updated medication list for this problem includes:    Atrovent 0.03 % Soln (Ipratropium bromide) .Marland Kitchen... 2  sprays on each side of the nose twice a day  Problem # 5:  single episode of confusion a few months ago, see review of systems recommend observation, TIA? now asymptomatic Will continue working on his cardiovascular risk control, increase ASA to 2 a day (states can't tolerate 325)  Complete Medication List: 1)  Omeprazole 20 Mg Cpdr (Omeprazole) .... Take 1 capsule by mouth at bedtime 2)  Amlodipine Besylate 10 Mg Tabs (Amlodipine besylate) .Marland Kitchen.. 1 by mouth once daily 3)  Niaspan 500 Mg Tbcr (Niacin (antihyperlipidemic)) .... Take 1 at bedtime 4)  Amitriptyline Hcl 10 Mg Tabs (Amitriptyline hcl) .... Take 1 tablet by mouth at bedtime 5)  One-daily Multivitamins Tabs (Multiple vitamin) .Marland Kitchen.. 1 tab once daily 6)  Bayer Low Strength 81 Mg Tbec (Aspirin) .... 2 tab once daily 7)  Fish Oil 1000 Mg Caps (Omega-3 fatty acids) .Marland Kitchen.. 1 by mouth once daily 8)  Simvastatin 10 Mg Tabs (Simvastatin) .Marland Kitchen.. 1 by mouth once daily 9)  Ocuvite Preservision Tabs (Multiple vitamins-minerals) .... Daily 10)  Atenolol 100 Mg Tabs (Atenolol) .Marland Kitchen.. 1 by mouth once daily 11)  Furosemide 20 Mg Tabs (Furosemide) .Marland Kitchen..  1 by mouth once daily - 12)  Klor-con M20 20 Meq Cr-tabs (Potassium chloride crys cr) .Marland Kitchen.. 1 by mouth once daily - stop spironolactone - labs due in 1 week 13)  Gemfibrozil 600 Mg Tabs (Gemfibrozil) .Marland Kitchen.. 1 tab once daily 14)  Atrovent 0.03 % Soln (Ipratropium bromide) .... 2  sprays on each side of the nose twice a day  Other Orders: Flu Vaccine 30yrs + ([address]) Administration Flu vaccine - MCR (U0454)  Patient Instructions: 1)  Please schedule a follow-up appointment in 4 months .  Flu Vaccine Consent Questions     Do you have a history of severe allergic reactions to this vaccine? no    Any prior history of allergic reactions to egg and/or gelatin? no    Do you have a sensitivity to the preservative Thimersol? no    Do you have a past history of Guillan-Barre Syndrome? no    Do you currently have an acute febrile  illness? no    Have you ever had a severe reaction to latex? no    Vaccine information given and explained to patient? yes    Are you currently pregnant? no    Lot Number:[id number]   Exp Date:05/19/2011   Site Given  Left Deltoid IMPrescriptions: ATROVENT 0.03 % SOLN (IPRATROPIUM BROMIDE) 2  sprays on each side of the nose twice a day  #1 x 6   Entered and Authorized by:   Elita Quick E. Roark Rufo MD   Signed by:   Nolon Rod. Renard Caperton MD on 07/21/2010   Method used:   Print then Give to Patient   RxID:   [id number]      .lbmedflu

## 2010-12-19 NOTE — Progress Notes (Signed)
   Phone Note Outgoing Call   Call placed by: Scherrie Bateman, LPN,  Mar 21, 1609 6:05 PM Call placed to: Patient Summary of Call: LEFT MESSAGE FOR PT TO CALL AND SCHEDULE APPT WITH DR WALL C/O CP AT DR PAZ'S OFFICE Initial call taken by: Scherrie Bateman, LPN,  Mar 22, 9603 6:05 PM  Follow-up for Phone Call        returning call, Migdalia Dk  Mar 22, 2010 9:12 AM   Additional Follow-up for Phone Call Additional follow up Details #1::        APPT SCHEDULED WITH DR WALL FOR 5/11//11 AT 3:00 PM WIFE AWARE./CY

## 2010-12-19 NOTE — Progress Notes (Signed)
Summary: RESEND FUROSEMIDE PRESCRIPTION  Phone Note Call from Patient   Caller: Spouse Summary of Call: PATIENT'S SPOUSE SAYS [facility] DOES NOT HAVE PRESCRIPTION FOR FUROSEMIDE--PLEASE CALL IT IN AGAIN BECAUSE HE HAS ONE PILL LEFT AND THEY ARE GOING OUT OF TOWN TOMORROW SO IT NEEDS TO BE PICKED UP TODAY Initial call taken by: Jerolyn Shin,  July 27, 2010 12:48 PM    Prescriptions: FUROSEMIDE 20 MG TABS (FUROSEMIDE) 1 by mouth once daily -  #30 Each x 1   Entered by:   Army Fossa CMA   Authorized by:   Nolon Rod. Paz MD   Signed by:   Army Fossa CMA on 07/27/2010   Method used:   Re-Faxed to ...       [facility]  [address]  [phone]* (retail)       [address]       [place], [place]  [address]       Ph: [phone] or [phone]       Fax: [phone]   RxID:   [id number]

## 2010-12-19 NOTE — Progress Notes (Signed)
Summary: hip pain  Phone Note Call from Patient Call back at Home Phone [phone]   Caller: wife Call For: [name] E. Christopher Haisley MD Reason for Call: Talk to Nurse Details for Reason: WIFE LEFT MESSAGE ON VM:   Summary of Call:  - 3 mo ROV had to be rescheduled until 10/2009 due to MD out of office  - pt is having hip pain that he wanted to discuss @ ROV  - will you rx pain med until ROV?  - [facility]  Initial call taken by: [name],  October 05, 2009 10:49 AM  Follow-up for Phone Call        if pain mild: tylenol 650mg  q 6 h if pain moderate, take Vicodin 5 -500mg  1 q 4 hours instead of tylenol if symptoms worsen: let me know call vidodin #60, discuss s/e (somnolence) [name] MD  October 06, 2009 10:00 AM   DISCUSSED WITH WIFE Follow-up by: [name],  October 06, 2009 10:06 AM    New/Updated Medications: VICODIN 5-500 MG TABS (HYDROCODONE-ACETAMINOPHEN) 1 EVERY 4 HOURS Prescriptions: VICODIN 5-500 MG TABS (HYDROCODONE-ACETAMINOPHEN) 1 EVERY 4 HOURS  #60 x 0   Entered by:   [name]   Authorized by:   Nolon Rod. Charika Mikelson MD   Signed by:   [name] on 10/06/2009   Method used:   Printed then faxed to ...       [facility] [address]  [phone]* (retail)       [address]       [place], [place]  [address]       Ph: [phone] or [phone]       Fax: [phone]   RxID:   [id number]

## 2010-12-19 NOTE — Progress Notes (Signed)
Summary: **RECENT LABS**  Phone Note Outgoing Call   Call placed by: Shonna Chock,  July 23, 2008 1:12 PM Call placed to: Patient Details for Reason: ** RECENT LABS** Summary of Call: LEFT MESSAGE ON MACHINE FOR PATIENT TO RETURN CALL WHEN AVALIABLE. COPY OF LABS MAILED.REASON FOR CALL: please advise the patient. his cholesterol is slightly better than before.  Continue with the same medicines. His potassium is low, start potassium 10 mEq one a day and recheck a  potassium level in two weeks kidney function, liver, and thyroid are normal Shonna Chock  July 23, 2008 1:14 PM   Follow-up for Phone Call        D/W PATIENT, SENT RX TO [facility]. SCHEDULED APPOINTMENT FOR 08/06/2008 @ 10AM TO RECHECK POTASSIUM Follow-up by: Shonna Chock,  July 23, 2008 4:27 PM    New/Updated Medications: KLOR-CON 10 10 MEQ CR-TABS (POTASSIUM CHLORIDE) 1 by mouth once daily   Prescriptions: KLOR-CON 10 10 MEQ CR-TABS (POTASSIUM CHLORIDE) 1 by mouth once daily  #30 x 2   Entered by:   Shonna Chock   Authorized by:   Nolon Rod. Paz MD   Signed by:   Shonna Chock on 07/23/2008   Method used:   Electronically to        [facility]  [phone]* (retail)       [address] [place], [place]  [address]       Ph: [phone] or [phone]       Fax: [phone]   RxID:   [id number]

## 2010-12-19 NOTE — Progress Notes (Signed)
Summary: lab results  Phone Note Outgoing Call   Summary of Call: potassium low.Marland KitchenMarland KitchenMarland KitchenMarland Kitchenper dr Drue Novel increase potassium to ...recheck in 4 weeks .......Marland KitchenShary Decamp  August 06, 2008 5:11 PM     New/Updated Medications: KLOR-CON M20 20 MEQ CR-TABS (POTASSIUM CHLORIDE CRYS CR) 1 by mouth once daily; recheck potassium in 4 weeks   Prescriptions: KLOR-CON M20 20 MEQ CR-TABS (POTASSIUM CHLORIDE CRYS CR) 1 by mouth once daily; recheck potassium in 4 weeks  #30 x 0   Entered by:   [name]   Authorized by:   Nolon Rod. Paz MD   Signed by:   [name] on 08/06/2008   Method used:   Electronically to        [facility]  [phone]* (retail)       [address] [place], [place]  [address]       Ph: [phone] or [phone]       Fax: [phone]   RxID:   [id number]

## 2010-12-19 NOTE — Progress Notes (Signed)
Summary: cx stress test  Phone Note Call from Patient   Caller: Patient Summary of Call: Pt called to cancel nuclear stress test. He will call to reschedule. Initial call taken by: E.Young

## 2010-12-19 NOTE — Progress Notes (Signed)
Summary: lab appt  Phone Note Outgoing Call Call back at [facility] Phone [phone] Call back at Work Phone [phone]   Summary of Call: NEEDS LAB APPT (to recheck his potassium) he needs a bmp  dx 276.8 [name]  November 22, 2009 8:58 AM     Additional Follow-up for Phone Call Additional follow up Details #2::    LEFT MESSAGE TO RETURN CALL.Marland KitchenMarland KitchenBarb Merino  November 22, 2009 9:06 AM   patient has a appt on Jan 11,2011.Marland KitchenMarland KitchenMarland KitchenBarb Merino  November 22, 2009 2:21 PM  Follow-up by: Barb Merino,  November 22, 2009 9:06 AM

## 2010-12-19 NOTE — Progress Notes (Signed)
Summary: rash  Phone Note Call from Patient Call back at Home Phone [phone]   Summary of Call: Itching, rash from chest up since starting on spironolactone & atenolol on 12/07/09 Initial call taken by: [name],  December 13, 2009 11:49 AM  Follow-up for Phone Call         if this is a medication rash is likely from his spironolactone patient has a history CHF, I like to keep him on diuretics plan: continue atenolol Lasix 20 mg daily KCl 20 mg daily BMP in one week Follow-up by: Nolon Rod. Javaris Wigington MD,  December 13, 2009 4:34 PM  Additional Follow-up for Phone Call Additional follow up Details #1::        discussed with pt Additional Follow-up by: [name],  December 13, 2009 5:07 PM   New Allergies: ! SPIRONOLACTONE (SPIRONOLACTONE) New/Updated Medications: FUROSEMIDE 20 MG TABS (FUROSEMIDE) 1 by mouth once daily - STOP SPIRONOLACTONE, NEEDS LABS IN 1 WEEK KLOR-CON M20 20 MEQ CR-TABS (POTASSIUM CHLORIDE CRYS CR) 1 by mouth once daily - STOP SPIRONOLACTONE - LABS DUE IN 1 WEEK New Allergies: ! SPIRONOLACTONE (SPIRONOLACTONE)Prescriptions: KLOR-CON M20 20 MEQ CR-TABS (POTASSIUM CHLORIDE CRYS CR) 1 by mouth once daily - STOP SPIRONOLACTONE - LABS DUE IN 1 WEEK  #30 x 0   Entered by:   [name]   Authorized by:   Nolon Rod. Litzy Dicker MD   Signed by:   [name] on 12/13/2009   Method used:   Electronically to        [facility]  [phone]* (retail)       [address]       [place], [place]  [address]       Ph: [phone] or [phone]       Fax: [phone]   RxID:   [id number] FUROSEMIDE 20 MG TABS (FUROSEMIDE) 1 by mouth once daily - STOP SPIRONOLACTONE, NEEDS LABS IN 1 WEEK  #30 x 0   Entered by:   [name]   Authorized by:   Nolon Rod. Lavine Hargrove MD   Signed by:   [name] on 12/13/2009   Method used:   Electronically to        [facility]  [phone]* (retail)       [address]       [place], [place]  [address]       Ph: [phone] or [phone]       Fax: [phone]   RxID:   [id number]

## 2010-12-19 NOTE — Letter (Signed)
Summary: [facility]: Immunoassay Fecal Occult Blood (iFOB) Order Form  [facility]  [address] [place], [place] [address]   Phone: [phone]  Fax: [phone]      [facility]: Immunoassay Fecal Occult Blood (iFOB) Order Form   Mar 24, 2010 MRN: [id number]   Christopher Hogan Jul 18, 1925   Physicican Name:__________paz______________  Diagnosis Code:_________v76.51_________________      [name]

## 2010-12-21 NOTE — Progress Notes (Signed)
Summary: Refill Request  Phone Note Refill Request Call back at [phone] Message from:  Pharmacy on December 14, 2010 3:12 PM  Refills Requested: Medication #1:  Lotrisone Cream   Dosage confirmed as above?Dosage Confirmed   Supply Requested: 45   Last Refilled: 07/14/2009 [facility] [address]  Next Appointment Scheduled: 1.27.12 (lab) Initial call taken by: Harold Barban,  December 14, 2010 3:12 PM  Follow-up for Phone Call        was rx for a leg rash last year, tell patient we will call 1 tube , no  RF  if rash persist, needs OV Lamyia Cdebaca E. Chitara Clonch MD  December 14, 2010 4:16 PM   Additional Follow-up for Phone Call Additional follow up Details #1::        unable to reach pt on home and work number, noted on RX that if rash persist then pt needs an appt. Army Fossa CMA  December 15, 2010 9:05 AM     New/Updated Medications: LOTRISONE 1-0.05 % CREA (CLOTRIMAZOLE-BETAMETHASONE) as directed. IF RASH PERSIST NEEDS APPOINTMENT. Prescriptions: LOTRISONE 1-0.05 % CREA (CLOTRIMAZOLE-BETAMETHASONE) as directed. IF RASH PERSIST NEEDS APPOINTMENT.  #1 x 0   Entered by:   Army Fossa CMA   Authorized by:   Nolon Rod. Ulices Maack MD   Signed by:   Army Fossa CMA on 12/15/2010   Method used:   Electronically to        [facility]  [phone]* (retail)       [address]       [place], [place]  [address]       Ph: [phone] or [phone]       Fax: [phone]   RxID:   [id number]

## 2010-12-21 NOTE — Assessment & Plan Note (Signed)
Summary: 4 MONTH RTO/KN   Vital Signs:  Patient profile:   77 year old male Weight:      165 pounds Pulse rate:   72 / minute Pulse rhythm:   regular BP sitting:   126 / 84  (left arm) Cuff size:   large  Vitals Entered By: Army Fossa CMA (November 22, 2010 11:11 AM) CC: 6 month f/u- not fasting  Comments [facility]    History of Present Illness: ROV feels well c/o increase flatus x years (since the 55s).   ROS No abdominal pain, blood in the stools. No nausea vomiting or diarrhea No chest or shortness of breath No lower extremity edema Ambulatory blood pressures within normal limits he got confused one time several months ago, no further spells.  He is taking 2 aspirins a day without GI side effects  Current Medications (verified): 1)  Omeprazole 20 Mg Cpdr (Omeprazole) .... Take 1 Capsule By Mouth At Bedtime 2)  Amlodipine Besylate 10 Mg Tabs (Amlodipine Besylate) .Marland Kitchen.. 1 By Mouth Once Daily 3)  Niaspan 500 Mg  Tbcr (Niacin (Antihyperlipidemic)) .... Take 1 At Bedtime 4)  Amitriptyline Hcl 10 Mg Tabs (Amitriptyline Hcl) .... Take 1 Tablet By Mouth At Bedtime 5)  One-Daily Multivitamins   Tabs (Multiple Vitamin) .Marland Kitchen.. 1 Tab Once Daily 6)  Bayer Low Strength 81 Mg  Tbec (Aspirin) .... 2 Tab Once Daily 7)  Fish Oil 1000 Mg Caps (Omega-3 Fatty Acids) .Marland Kitchen.. 1 By Mouth Once Daily 8)  Simvastatin 10 Mg Tabs (Simvastatin) .Marland Kitchen.. 1 By Mouth Once Daily 9)  Ocuvite Preservision  Tabs (Multiple Vitamins-Minerals) .... Daily 10)  Atenolol 100 Mg Tabs (Atenolol) .Marland Kitchen.. 1 By Mouth Once Daily 11)  Furosemide 20 Mg Tabs (Furosemide) .Marland Kitchen.. 1 By Mouth Once Daily. Due For Office Visit. 12)  Klor-Con M20 20 Meq Cr-Tabs (Potassium Chloride Crys Cr) .Marland Kitchen.. 1 By Mouth Once Daily - Stop Spironolactone - Labs Due in 1 Week 13)  Gemfibrozil 600 Mg Tabs (Gemfibrozil) .Marland Kitchen.. 1 Tab Once Daily 14)  Atrovent 0.03 % Soln (Ipratropium Bromide) .... 2  Sprays On Each Side of The Nose Twice A  Day  Allergies (verified): 1)  ! Spironolactone (Spironolactone) 2)  * Ace Inhibitors Group 3)  Aspirin (Aspirin)  Past History:  Past Medical History: Reviewed history from 07/21/2010 and no changes required. CAD--- stable stress test 7-11 Congestive heart failure    Hypertension AAA-repaired 02/2002,  renal ultrasound, September 2010: Normal abdominal aorta, normal renal arteries Hyperlipidemia ED Allergic rhinitis GERD decreased right carotid pulse?.......carotid ultrasound 5-11----0 to  39% bilaterally  Past Surgical History: Reviewed history from 06/18/2009 and no changes required. Cholecystectomy Inguinal herniorrhaphy AAA repair 2003  Social History: Married, lives w/ wife  3 children, 2 live close by Former Smoker Alcohol use-no  [employer] veteran diet-- healthy exercise-- active if good weather  still drives  independent on his ADL  Physical Exam  General:  alert and well-developed.   Lungs:  normal respiratory effort, no intercostal retractions, no accessory muscle use, and normal breath sounds.   Heart:  normal rate, regular rhythm, no murmur, and no gallop.   Extremities:  no edema Psych:  Oriented X3, memory intact for recent and remote, not anxious appearing, and not depressed appearing.     Impression & Recommendations:  Problem # 1:  CORONARY ARTERY DISEASE (ICD-414.00) asymptomatic, controlling risk factors His updated medication list for this problem includes:    Amlodipine Besylate 10 Mg Tabs (Amlodipine besylate) .Marland KitchenMarland KitchenMarland KitchenMarland Kitchen  1 by mouth once daily    Bayer Low Strength 81 Mg Tbec (Aspirin) .Marland Kitchen... 2 tab once daily    Atenolol 100 Mg Tabs (Atenolol) .Marland Kitchen... 1 by mouth once daily    Furosemide 20 Mg Tabs (Furosemide) .Marland Kitchen... 1 by mouth once daily. due for office visit.  Problem # 2:  HYPERTENSION (ICD-401.9)  at goal  His updated medication list for this problem includes:    Amlodipine Besylate 10 Mg Tabs (Amlodipine besylate) .Marland Kitchen... 1 by mouth once daily     Atenolol 100 Mg Tabs (Atenolol) .Marland Kitchen... 1 by mouth once daily    Furosemide 20 Mg Tabs (Furosemide) .Marland Kitchen... 1 by mouth once daily. due for office visit.  BP today: 126/84 Prior BP: 122/78 (07/21/2010)  Labs Reviewed: K+: 3.9 (03/17/2010) Creat: : 1.0 (03/17/2010)   Chol: 129 (03/17/2010)   HDL: 36.90 (03/17/2010)   LDL: 60 (03/17/2010)   TG: 161.0 (03/17/2010)  Orders: TLB-BMP (Basic Metabolic Panel-BMET) (80048-METABOL) Specimen Handling ([address])  Problem # 3:  HYPERLIPIDEMIA (ICD-272.4)  well  controled His updated medication list for this problem includes:    Niaspan 500 Mg Tbcr (Niacin (antihyperlipidemic)) .Marland Kitchen... Take 1 at bedtime    Simvastatin 10 Mg Tabs (Simvastatin) .Marland Kitchen... 1 by mouth once daily    Gemfibrozil 600 Mg Tabs (Gemfibrozil) .Marland Kitchen... 1 tab once daily  Labs Reviewed: SGOT: 19 (03/17/2010)   SGPT: 17 (03/17/2010)   HDL:36.90 (03/17/2010), 30.20 (07/18/2009)  LDL:60 (03/17/2010), 68 (07/18/2009)  Chol:129 (03/17/2010), 123 (07/18/2009)  Trig:161.0 (03/17/2010), 122.0 (07/18/2009)  Orders: TLB-ALT (SGPT) (84460-ALT) TLB-AST (SGOT) (84450-SGOT) Venipuncture ([address]) Specimen Handling ([address]11)  Problem # 4:  complaining of flatulence recommend align and avoid gas producing foods  Complete Medication List: 1)  Omeprazole 20 Mg Cpdr (Omeprazole) .... Take 1 capsule by mouth at bedtime 2)  Amlodipine Besylate 10 Mg Tabs (Amlodipine besylate) .Marland Kitchen.. 1 by mouth once daily 3)  Niaspan 500 Mg Tbcr (Niacin (antihyperlipidemic)) .... Take 1 at bedtime 4)  Amitriptyline Hcl 10 Mg Tabs (Amitriptyline hcl) .... Take 1 tablet by mouth at bedtime 5)  One-daily Multivitamins Tabs (Multiple vitamin) .Marland Kitchen.. 1 tab once daily 6)  Bayer Low Strength 81 Mg Tbec (Aspirin) .... 2 tab once daily 7)  Fish Oil 1000 Mg Caps (Omega-3 fatty acids) .Marland Kitchen.. 1 by mouth once daily 8)  Simvastatin 10 Mg Tabs (Simvastatin) .Marland Kitchen.. 1 by mouth once daily 9)  Ocuvite Preservision Tabs (Multiple vitamins-minerals)  .... Daily 10)  Atenolol 100 Mg Tabs (Atenolol) .Marland Kitchen.. 1 by mouth once daily 11)  Furosemide 20 Mg Tabs (Furosemide) .Marland Kitchen.. 1 by mouth once daily. due for office visit. 12)  Klor-con M20 20 Meq Cr-tabs (Potassium chloride crys cr) .Marland Kitchen.. 1 by mouth once daily - stop spironolactone - labs due in 1 week 13)  Gemfibrozil 600 Mg Tabs (Gemfibrozil) .Marland Kitchen.. 1 tab once daily 14)  Atrovent 0.03 % Soln (Ipratropium bromide) .... 2  sprays on each side of the nose twice a day  Patient Instructions: 1)  align OTC 1 a day 2)  avoid gas producing foods 3)  Please schedule a follow-up appointment in 4 months .    Orders Added: 1)  TLB-ALT (SGPT) [84460-ALT] 2)  TLB-AST (SGOT) [84450-SGOT] 3)  Venipuncture [36415] 4)  TLB-BMP (Basic Metabolic Panel-BMET) [80048-METABOL] 5)  Specimen Handling [99000] 6)  Est. Patient Level III [91478]

## 2011-04-03 NOTE — Assessment & Plan Note (Signed)
[facility]                            CARDIOLOGY OFFICE NOTE   IYAD, DEROO                     MRN:          [id number]  DATE:07/15/2008                            DOB:          11-Jan-1925    Mr. 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   I saw him initially on June 25, 2007.  At that time, we did a stress  Myoview, which showed an EF of 48% with a moderate inferolateral wall MI  or scar, but no ischemia.  We also performed an abdominal aortic  ultrasound, which showed stable repair of a RNG AAA with tube graft.   He has had a good year.  He denies any angina or anginal equivalents.  He has had no abdominal pain or back pain.  He denies any significant  shortness of breath, orthopnea, or PND.   His meds are unchanged since last year except his felodipine has been  discontinued.  He is on amlodipine now 10 mg a day.   PHYSICAL EXAMINATION:  His blood pressure is 126/75, his pulse is 68 and  regular.  Weight 170.  HEENT:  Unchanged.  NECK:  Carotids are full without bruits.  No JVD.  Thyroid is not  enlarged.  Neck is supple.  LUNGS:  Clear.  HEART:  Soft S1 and S2.  PMI is nondisplaced.  There is no murmur at the  apex.  ABDOMEN:  Soft.  There is no midline bruit.  There is no pulsatile mass.  There is no organomegaly.  EXTREMITIES:  No cyanosis, clubbing, or edema.  Pulses are present.  NEURO:  Intact.   Electrocardiogram showed sinus rhythm with a new incomplete bundle-  branch block and old inferior lateral wall infarct pattern, which is  old.   ASSESSMENT AND PLAN:  Mr. Smalls is doing well.  I have made no changes  to his medical program.  We will see him back in a year.  At that time,  he will need objective assessment of his coronaries and his abdominal  aortic repair again.     Thomas C. Daleen Squibb, MD, [facility]  Electronically Signed    TCW/MedQ  DD: 07/15/2008  DT: 07/16/2008  Job #: [id number]   cc:   Willow Ora, MD

## 2011-04-03 NOTE — Assessment & Plan Note (Signed)
[facility]                            CARDIOLOGY OFFICE NOTE   Christopher Hogan, Christopher Hogan                     MRN:          [id number]  DATE:06/25/2007                            DOB:          [birth year]    CHIEF COMPLAINT:  I'm here to get my heart checked up.   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   His history is significant for having an inferior wall infarct in 1989  and a PTCA at that time by Christopher Hogan.  He had a previous PTCA of the  RCA and a relook catheterization in 1992.   He is having no symptoms of angina or ischemia.   In 2003, he presented with an aneurysm of 6.8 cm, that is, an abdominal  aortic aneurysm.  Christopher Hogan repaired this with a 16 mm tube  graft.  He has not had followup, as best he knows, since the  perioperative period.   He is currently having no orthopnea, PND, peripheral edema, or angina.   He recently saw Christopher Hogan, and he recommended him follow up with [place].   He is intolerant of FULL-STRENGTH ASPIRIN.  He is not allergic to dyes.   CURRENT MEDICATIONS:  1. Aspirin 81 mg a day.  2. Gemfibrozil 600 mg p.o. b.i.d.  3. Omeprazole 20 mg a day.  4. Atenolol/chlorthalidone 100/25 daily.  5. Felodipine 10 mg a day.  6. Niaspan 500 mg a day.  7. Centrum Silver daily.  8. Omega 3.   He quit smoking in 1953.  He does enjoy coffee which is caffeinated.  He  walks most days, weather permitting.  He denies any claudication.   He has had a previous right-sided hernia repair in 1985.  He has had a  cholecystectomy in 2000.  Aneurysm, as mentioned above, in 2000.   He gets a lot of his health care at the [facility], where he gets his medicines  for free.   He is married.  He has three children.  His current marriage is about a  year old.  He is a retired Psychologist, prison and probation services.   REVIEW OF SYSTEMS:  Other than the HPI, is really negative except for  some reflux and depression in 2004.  He has some sinus issues and excess  gas.  Other review of systems are negative.   PHYSICAL EXAMINATION:  A very pleasant gentleman in no acute distress.  Blood pressure 126/75.  His pulse is 70 and regular.  He is 5 feet 10,  172 pounds.  HEENT:  Normocephalic and atraumatic.  PERRLA.  Extraocular movements  are intact.  Sclerae are clear.  Facial symmetry is normal.  NECK:  Carotids are full without bruits.  No JVD.  Thyroid is not  enlarged.  Trachea is midline.  LUNGS:  Clear.  HEART:  A nondisplaced PMI.  There is a normal S1 and S2 without murmur  or gallop.  ABDOMEN:  Soft with  good bowel sounds.  No midline bruit.  No  tenderness.  No obvious organomegaly.  EXTREMITIES:  No clubbing, cyanosis or edema.  Pulses are +1/4  bilaterally symmetrical.  He has a little bit of dependent rubor.  There  is some minimal edema at the sock line.  There are some varicose veins.  There is no sign of DVT.  NEUROLOGIC:  Intact.  SKIN:  Intact.   Electrocardiogram shows sinus rhythm with an old inferior wall infarct.   ASSESSMENT:  [name] is stable from a cardiovascular standpoint.  He  is long overdue an objective assessment of his coronaries, however.   PLAN:  1. Exercise rest-stress Myoview, off of atenolol since he is an active      walker.  2. Abdominal ultrasound to follow up on his abdominal aortic aneurysm      repair.   Assuming these are stable, I will see him back in a year.     Christopher C. Daleen Squibb, MD, [facility]  Electronically Signed    TCW/MedQ  DD: 06/25/2007  DT: 06/25/2007  Job #: [id number]   cc:   Christopher Ora, MD

## 2011-04-06 NOTE — Discharge Summary (Signed)
[facility]  Patient:    Christopher Hogan, Christopher Hogan Visit Number: [id number] MRN: [id number]          Service Type: MED Location: 3W [id number] Attending Physician:  Christopher Hogan. Dictated by:   Christopher Hogan, M.D. [facility] Admit Date:  09/24/2001 Discharge Date: 09/30/2001                             Discharge Summary  DISCHARGE DIAGNOSES: 1. Left lower lobe pneumonia. 2. Dehydration. 3. Hypertension. 4. Gastroesophageal reflux disease. 5. History of coronary artery disease. 6. Hypercholesterolemia. 7. Transient hypokalemia.  CONSULTATIONS:  None.  PROCEDURES:  None.  HISTORY OF PRESENT ILLNESS:  Please see the History and Physical dictated by Dr. Ruthine Hogan on date of admission.  HOSPITAL COURSE:  Mr. Stay is a 77 year old, white male who was originally admitted with nausea, vomiting and dehydration.  Routine work-up revealed elevated white blood cell count at 7.2.  Chest x-ray with left lower lobe infiltrate.  There was some mild anemia at 11.8 as well.  He was initially treated with IV fluids, antiemetics and the morning after admission, he was begun on IV antibiotics in the form of Rocephin and azithromycin.  He seemed to have slow improvement, however, required IV antibiotics until the date of discharge where he felt much more strength and was able to move around fairly easily in the room without shortness of breath, but still somewhat weak. There was little cough and no chest discomfort involved.  There is some mild hypokalemia with potassium of 3.8 on discharge.  BUN and creatinine 7 and 0.8 with glucose 95.  Chest x-ray the day prior to discharge did show some improvement in the left lower lobe infiltrate.  As he was ambulatory, eating well and taking p.o. and afebrile, it was felt that he had gained maximum benefit from this hospitalization.  It is also noted that white blood cell count was 6.1 the day prior to discharge.  DISPOSITION:  Discharged to  home.  CONDITION ON DISCHARGE:  Good.  ACTIVITY:  No restrictions.  DIET:  Low cholesterol diet.  DISCHARGE MEDICATIONS: 1. Atenolol 50 mg p.o. q.d. 2. Lopid 600 mg b.i.d. 3. Altace 5 mg q.d. 4. Nexium 40 mg p.o. q.d. 5. Niaspan 1 g. 6. Ceftin 250 mg p.o. b.i.d. x 5 more days.  FOLLOWUP:  Follow up with his primary physician in one to two weeks. Dictated by:   Christopher Hogan, M.D. [facility] Attending Physician:  Christopher Hogan. DD:  09/30/01 TD:  09/30/01 Job: [id number]

## 2011-04-06 NOTE — Op Note (Signed)
[place]  Patient:    AGNES, PROBERT Visit Number: [id number] MRN: [id number]          Service Type: DSU Location: [facility] 2899 11 Attending Physician:  Bennye Alm Dictated by:   Di Kindle Edilia Bo, M.D. Proc. Date: 02/23/02 Admit Date:  02/23/2002   CC:         Angelena Sole, M.D. [facility]  Bruce R. Juanda Chance, M.D. [facility]  Peripheral Vascular lab   Operative Report  PREOPERATIVE DIAGNOSIS:  A 6.8 cm abdominal aortic aneurysm.  POSTOPERATIVE DIAGNOSIS:  A 6.8 cm abdominal aortic aneurysm.  OPERATION PERFORMED: 1. Aortogram. 2. Bilateral iliac arteriogram. 3. Bilateral lower extremity run-off.  INDICATIONS FOR PROCEDURE:  The patient is a 77 year old gentleman who on examination was found to have a pulsatile abdominal mass.  This prompted an ultrasound which was done in March which showed evidence of an aneurysm measuring 6.8 cm in maximum diameter.  He was sent over for further evaluation and arteriogram was recommended in order to plan elective repair of his aneurysm and in order to determine is he is a candidate for endovascular repair.  SURGEON:  Di Kindle. Edilia Bo, M.D.  ASSISTANT:  Adair Patter, P.A.  ANESTHESIA:  Local with sedation.  DESCRIPTION OF PROCEDURE:  The patient was brought to the peripheral vascular cath lab at [facility]. [facility] and sedated with 1 mg of Versed and 50 mcg of fentanyl.  Both groins were prepped and draped in the usual sterile fashion.  After the skin was infiltrated with 1% lidocaine, the right common femoral artery was cannulated and a guide wire introduced into the infrarenal aorta under fluoroscopic control.  A 5 French sheath was passed over the wire and the dilator removed.  Pigtail catheter was positioned at the L1 vertebral body and flush aortogram obtained.  A lateral projection was then obtained.  The catheter was then repositioned above the aortic bifurcation  and oblique iliac projections were obtained.  I then did bilateral lower extremity run-off films.  Because of his generalized arteriomegaly, there was poor visualization distally.  For this reason, the pigtail catheter was exchanged for an IMA catheter which was positioned into the proximal left common iliac artery.  Left lower extremity run-off film was obtained.  Next, this catheter was removed over a wire and right lower extremity run-off film obtained through the right femoral sheath.  FINDINGS:  There are single renal arteries bilaterally. The left renal artery originates below the level of the right renal artery.  There is significant right to left angulation of the proximal neck and also some posterior to anterior angulation of the neck.  The distance between the beginning of the aneurysm and the left renal artery which is the lowest renal artery is approximately 1 cm.  Based on the short neck and significant angulation of the proximal neck, it appears that he is not a candidate for an endovascular stent graft.  The size of the aneurysm that fills is approximately 4 cm; however, this study does not show the accurate size of the aneurysm as there is likely significant laminated thrombus.  Based on a previous ultrasound, the aneurysm measured 6.8 cm in maximum diameter.  The inferior mesenteric artery is not visualized.  On the lateral projection there is brisk filling of the celiac and superior mesenteric artery which showed no evidence of stenosis.  There is no renal artery stenosis.  Both common iliac, external iliac and hypogastric arteries were widely  patent bilaterally.  There was some slight dilatation of the right common femoral artery.  The deep femoral, superficial femoral and popliteal arteries were patent bilaterally.  There was three-vessel run-off bilaterally.  There was poor visualization distally because of his generalized arteriomegaly.  CONCLUSIONS: 1. Infrarenal  abdominal aortic aneurysm with significant angulation of the    proximal neck and also a short proximal neck.  Based on these    characteristics, he is not a candidate for endovascular repair of his    aneurysm. 2. Slight dilatation of the right common femoral artery. 3. No significant infrarenal arterial occlusive disease.  He does have some    generalized arteriomegaly. dressing was applied.  The patient tolerated the procedure well and was transferred to the recovery room in satisfactory condition.  All needle and sponge counts were correct. Dictated by:   Di Kindle Edilia Bo, M.D. Attending Physician:  Bennye Alm DD:  02/23/02 TD:  02/23/02 Job: [id number]

## 2011-04-06 NOTE — Discharge Summary (Signed)
[place]  Patient:    Christopher Hogan, Christopher Hogan Visit Number: [id number] MRN: [id number]          Service Type: SUR Location: 2000 2013 01 Attending Physician:  Bennye Alm Dictated by:   Maxwell Marion, RNFA Admit Date:  03/17/2002 Discharge Date: 03/22/2002   CC:         Angelena Sole, M.D. [facility]   Discharge Summary  DATE OF BIRTH:  05-03-1925  ADMISSION DIAGNOSIS:  Abdominal aortic aneurysm.  PAST MEDICAL HISTORY: 1. Coronary artery disease status post MI in 1989. 2. Hypertension. 3. History of pneumonia November 2002. 4. Status post laparoscopic cholecystectomy in 1995.  ALLERGIES:  No known drug allergies.  DISCHARGE DIAGNOSIS:  Juxtarenal 6.8 cm abdominal aortic aneurysm, status post repair.  BRIEF HISTORY:  Mr. Orosz is a 77 year old Caucasian man who was found to have an abdominal aortic aneurysm on examination.  He underwent ultrasound of his abdomen in March 2003 which revealed his aneurysm to be approximately 6.8 cm in greatest diameter.  He was referred to [facility] and evaluated in the office by Dr. Waverly Ferrari on April 2.  After examination of the patient and review of the records including ultrasound results, Dr. Edilia Bo recommended surgical repair of the aneurysm.  Before surgery, Mr. Hoque underwent cardiac clearance by Dr.  Juanda Chance.  He also had a CT scan of his abdomen to determine if he was a candidate for an endovascular repair, and he was determined not to be a candidate for this procedure.  Open surgical repair of his aneurysm was discussed with Mr. Bjorkman including the risks and benefits of the planned procedure, and he agreed to proceed.  HOSPITAL COURSE:  On April 29, Mr. Hoehn was electively admitted to [facility] under the care of Dr. Waverly Ferrari.  He underwent an uncomplicated repair of his juxtarenal abdominal aortic aneurysm with a 16 mm Dacron graft.  He tolerated  the procedure well and was transferred in stable condition to the SICU.  Mr. Primmer has remained hemodynamically stable since his surgery, and his postoperative course has been uneventful.  Today, on postoperative day #4, May 3, his vital signs are stable, and he is afebrile.  He is tolerating a full liquid diet.  He has had no nausea, and he has had a bowel movement.  His abdominal incision is healing well.  He has palpable dorsalis pedal pulses bilaterally.  His ambulation is improving. Plan is to advance his diet to a regular diet today.  If he tolerates this and continues to improve, it is anticipated he will be ready for discharge home tomorrow, Mar 22, 2002.  CONDITION UPON DISCHARGE:  Improved.  ACTIVITY:  He has been instructed to refrain from driving and any heavy lifting.  He has also been instructed to continue his breathing exercises and daily walks.  DIET:  Low fat, low salt.  WOUND CARE:  He may shower at home using mild soap and water.  He has been instructed to call Dr. Quillian Quince office if his incision becomes red, hot, swollen, draining, or if he has a fever greater than 101 degrees F.  DISCHARGE MEDICATIONS: 1. Tylox 1 to 2 q.4-6h. p.r.n. pain. He has been instructed to resume home medications of: 2. Gemfibrozil 600 mg p.o. b.i.d. 3. Atenolol 50 mg p.o. q.d. 4. Niaspan 100 mg p.o. q.d. 5. Aspirin 81 mg p.o. q.d. 6. Rabeprazole 20 mg p.o. q.d. 7. Amitriptyline 10 mg p.o. q.h.s.  FOLLOWUP: 1. He will have an appointment for staple removal in the CVTS office in    approximately one week.  The office will call with the date and time of    this appointment. 2. He will also have an appointment to see Dr. Edilia Bo at the CVTS office in    approximately three weeks.  The office will call with the date and time of    this appointment.Dictated by:   Maxwell Marion, RNFA Attending Physician:  Bennye Alm DD:  03/21/02 TD:  03/24/02 Job:  [id number]

## 2011-04-06 NOTE — Op Note (Signed)
[place]  Patient:    KELYN, KOSKELA Visit Number: [id number] MRN: [id number]          Service Type: SUR Location: 2300 2316 01 Attending Physician:  Bennye Alm Dictated by:   Di Kindle. Edilia Bo, M.D. Proc. Date: 03/17/02 Admit Date:  03/17/2002   CC:         Bruce R. Juanda Chance, M.D. [facility]  Angelena Sole, M.D. [facility], [phone] [address] [place], [place] [address]   Operative Report  PREOPERATIVE DIAGNOSIS:  Juxtarenal 6.8 cm abdominal aortic aneurysm.  POSTOPERATIVE DIAGNOSIS:  Juxtarenal 6.8 cm abdominal aortic aneurysm.  OPERATION:  Repair of juxtarenal abdominal aortic aneurysm with a 16 mm tube graft.  SURGEON:  Di Kindle. Edilia Bo, M.D.  ASSISTANT:  Loura Pardon, P.A.  ANESTHESIA:  General.  INDICATIONS:  This is a 77 year old gentleman who was found to have a 6.8 cm abdominal aortic aneurysm.  On CT scan and arteriographic evaluation, this was noted to be juxtarenal.  Given the size of the aneurysm, elective repair was recommended given the 5 to 10% risk per year of rupture.  The procedure and potential complications were discussed with the patient in detail preoperatively.  He was not a candidate for endovascular repair as it was a juxtarenal aneurysm.  TECHNIQUE:  The patient was taken to the operating room and received general anesthetic.  The abdomen and groins were prepped and draped in the usual sterile fashion.  The abdomen was entered through a midline incision.  Upon careful exploration, the only other intra-abdominal pathology was some minor diverticular disease.  The transverse colon and omentum were reflected superiorly and the small bowel reflected to the right.  The retroperitoneal tissue was divided, and the aneurysm was dissected free up to the level of the renal vein.  This was controlled with a blue vessel loop so that it could be retracted to allow exposure of the right and left renal  arteries.  The left renal artery originated lower than the right renal artery, and it appeared that I could place a clamp at this level and then separately clamp the left renal artery to allow proximal anastomosis to be done right at the level of the left renal artery.  On the anterior wall of the aorta, there was a small out pouching just below the level of the renal artery, and, therefore, I felt I would have to go up this high and not leave much more of a neck on the aorta than this.  Once the proximal exposure was attained, dissection was carried down to the bifurcation where the common iliac arteries were dissected free and controlled with blue vessel loops.  The patient then received 25 g of mannitol and 8000 units of IV heparin.  A 16 mm tube graft was selected.  The aorta was clamped between the level of the right renal artery and the left renal artery.  The clamp was placed on the left renal artery.  Both common iliac arteries were clamped.  The aneurysm was then opened and laminated thrombus removed. Several lumbers were oversewn with 2-0 silk ties.  The aneurysm was then divided circumferentially just below the level of the left renal artery. Using a felt cup, a 16 mm tube graft was sewn end-to-end to the aorta using continuous 3-0 Prolene suture.  The graft was then pulled to appropriate length for anastomosis to the distal aorta.  The graft was divided and then sewn end-to-end to the distal aorta  using a running 3-0 Prolene suture.  At the completion, there was clean anastomosis, and the vessels were back bled and flushed appropriately, and the anastomosis completed.  Flow was reestablished first to the pelvis and then into the lower extremities.  The patient tolerated this from a hemodynamic standpoint.  Next, hemostasis was obtained to the wounds, and the lumbers had to have some additional sutures placed for hemostasis.  The abdomen was irrigated with copious amounts of  antibiotic solution.  The aneurysm was closed over the graft using running 2-0 Vicryl.  The retroperitoneal tissue was closed with a running 2-0 Vicryl.  The abdominal contents were returned to the normal position, and then the fascia was closed with two running #1 PDS sutures.  The skin was closed with staples.  A sterile dressing was applied. The patient tolerated the procedure well and was transferred to the recovery room in satisfactory condition.  All needle and sponge counts were correct. Dictated by:   Di Kindle Edilia Bo, M.D. Attending Physician:  Bennye Alm DD:  03/17/02 TD:  03/17/02 Job: [id number]

## 2011-04-26 ENCOUNTER — Encounter: Payer: Self-pay | Admitting: Internal Medicine

## 2011-04-27 ENCOUNTER — Encounter: Payer: Self-pay | Admitting: Internal Medicine

## 2011-04-27 ENCOUNTER — Ambulatory Visit (INDEPENDENT_AMBULATORY_CARE_PROVIDER_SITE_OTHER): Payer: Medicare Other | Admitting: Internal Medicine

## 2011-04-27 DIAGNOSIS — I2510 Atherosclerotic heart disease of native coronary artery without angina pectoris: Secondary | ICD-10-CM

## 2011-04-27 DIAGNOSIS — I10 Essential (primary) hypertension: Secondary | ICD-10-CM

## 2011-04-27 DIAGNOSIS — E785 Hyperlipidemia, unspecified: Secondary | ICD-10-CM

## 2011-04-27 DIAGNOSIS — J309 Allergic rhinitis, unspecified: Secondary | ICD-10-CM

## 2011-04-27 LAB — BASIC METABOLIC PANEL
BUN: 12 mg/dL (ref 6–23)
CO2: 30 mEq/L (ref 19–32)
Calcium: 8.8 mg/dL (ref 8.4–10.5)
Chloride: 104 mEq/L (ref 96–112)
Creatinine, Ser: 1.1 mg/dL (ref 0.4–1.5)
GFR: 70.05 mL/min (ref >60.00)
Glucose, Bld: 106 mg/dL — ABNORMAL HIGH (ref 70–99)
Potassium: 4 mEq/L (ref 3.5–5.1)
Sodium: 140 mEq/L (ref 135–145)

## 2011-04-27 LAB — CBC WITH DIFFERENTIAL/PLATELET
Basophils Absolute: 0.1 10*3/uL (ref 0.0–0.1)
Basophils Relative: 0.8 % (ref 0.0–3.0)
Eosinophils Absolute: 0.3 10*3/uL (ref 0.0–0.7)
Eosinophils Relative: 4.5 % (ref 0.0–5.0)
HCT: 40.5 % (ref 39.0–52.0)
Hemoglobin: 13.9 g/dL (ref 13.0–17.0)
Lymphocytes Relative: 22.7 % (ref 12.0–46.0)
Lymphs Abs: 1.6 10*3/uL (ref 0.7–4.0)
MCHC: 34.3 g/dL (ref 30.0–36.0)
MCV: 94.1 fl (ref 78.0–100.0)
Monocytes Absolute: 0.7 10*3/uL (ref 0.1–1.0)
Monocytes Relative: 10.3 % (ref 3.0–12.0)
Neutro Abs: 4.3 10*3/uL (ref 1.4–7.7)
Neutrophils Relative %: 61.7 % (ref 43.0–77.0)
Platelets: 181 10*3/uL (ref 150.0–400.0)
RBC: 4.3 Mil/uL (ref 4.22–5.81)
RDW: 14.5 % (ref 11.5–14.6)
WBC: 7 10*3/uL (ref 4.5–10.5)

## 2011-04-27 LAB — LIPID PANEL
Cholesterol: 138 mg/dL (ref 0–200)
HDL: 35.1 mg/dL — ABNORMAL LOW (ref >39.00)
LDL Cholesterol: 68 mg/dL (ref 0–99)
Total CHOL/HDL Ratio: 4
Triglycerides: 174 mg/dL — ABNORMAL HIGH (ref 0.0–149.0)
VLDL: 34.8 mg/dL (ref 0.0–40.0)

## 2011-04-27 MED ORDER — KETOCONAZOLE 1 % EX SHAM: MEDICATED_SHAMPOO | CUTANEOUS | Status: DC

## 2011-04-27 NOTE — Assessment & Plan Note (Signed)
Well-controlled, no change. Lab

## 2011-04-27 NOTE — Patient Instructions (Signed)
Shampoo 3 times a week Qnsal : 2 sprays on each side of the nose daily, call for a prescription if it helps

## 2011-04-27 NOTE — Progress Notes (Signed)
  Subjective:    Patient ID: Christopher Hogan, male    DOB: Dec 21, 1924, 77 y.o.   MRN: [id number]  HPI  Routine office visit Long history of scalp itching, for years, is also slightly scaly. Chronic problems with sinus congestion and postnasal dripping, previously astepro and atrovent nasal sprays did not work CAD: stress test a few months ago negative, good medication compliance, feeling well Hypertension, good medication compliance, checks his blood pressure at the pharmacy from time to time with good results.  Past Medical History  Diagnosis Date  . CAD (coronary artery disease)     stable stress test 7/11  . CHF (congestive heart failure)   . Hypertension   . Hyperlipidemia   . ED (erectile dysfunction)   . Allergic rhinitis   . GERD (gastroesophageal reflux disease)   . AAA (abdominal aortic aneurysm)     repaired 02/2002, renal ultrasound, Sept 2010: normal abdominal aorta, normal renal arteries    Past Surgical History  Procedure Date  . Cholecystectomy   . Inguinal hernia repair   . Aaa repair 2003  . Carotid ultrasound 03-2010    negative      Review of Systems  Respiratory: Negative for cough and shortness of breath.   Cardiovascular: Negative for chest pain and leg swelling.  Genitourinary: Negative for hematuria and difficulty urinating.       Objective:   Physical Exam  Constitutional: He appears well-developed and well-nourished.  HENT:  Nose: Nose normal.  Cardiovascular: Normal rate, regular rhythm and normal heart sounds.   No murmur heard. Pulmonary/Chest: Effort normal and breath sounds normal. No respiratory distress. He has no wheezes. He has no rales.  Musculoskeletal: He exhibits no edema.  Skin:       Scalp looks very healthy, actually no rash or scaliness or redness           Assessment & Plan:  Scalp itching, exam normal, trial w/  ketoconazole shampoo

## 2011-04-27 NOTE — Assessment & Plan Note (Addendum)
Trial w/ Qnsal

## 2011-04-27 NOTE — Assessment & Plan Note (Signed)
Last stress test 05-2010, stable.  Seems stable, doing well. No change in therapy

## 2011-04-27 NOTE — Assessment & Plan Note (Signed)
Good medication compliance, labs 

## 2011-06-12 ENCOUNTER — Other Ambulatory Visit: Payer: Self-pay | Admitting: Internal Medicine

## 2011-06-12 MED ORDER — FUROSEMIDE 20 MG PO TABS: 20.0000 mg | ORAL_TABLET | Freq: Every day | ORAL | Status: DC

## 2011-06-12 NOTE — Telephone Encounter (Signed)
Sent in

## 2011-06-19 ENCOUNTER — Other Ambulatory Visit: Payer: Self-pay | Admitting: Internal Medicine

## 2011-07-04 ENCOUNTER — Encounter: Payer: Self-pay | Admitting: Cardiology

## 2011-07-04 ENCOUNTER — Ambulatory Visit (INDEPENDENT_AMBULATORY_CARE_PROVIDER_SITE_OTHER): Payer: Medicare Other | Admitting: Cardiology

## 2011-07-04 VITALS — BP 140/70 | HR 60 | Ht 69.0 in | Wt 166.0 lb

## 2011-07-04 DIAGNOSIS — I739 Peripheral vascular disease, unspecified: Secondary | ICD-10-CM

## 2011-07-04 DIAGNOSIS — I2510 Atherosclerotic heart disease of native coronary artery without angina pectoris: Secondary | ICD-10-CM

## 2011-07-04 DIAGNOSIS — I714 Abdominal aortic aneurysm, without rupture: Secondary | ICD-10-CM

## 2011-07-04 NOTE — Assessment & Plan Note (Signed)
Stable. Continued medical therapy.

## 2011-07-04 NOTE — Patient Instructions (Signed)
Your physician recommends that you schedule a follow-up appointment in: 12 MONTHS. Your physician recommends that you continue on your current medications as directed. Please refer to the Current Medication list given to you today. 

## 2011-07-04 NOTE — Progress Notes (Signed)
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  He is having no angina or chest pain. He denies any claudication. He's had no abdominal pain.  He is compliant with his medications. Meds reviewed.  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 No acute changes. Past Medical History  Diagnosis Date  . CAD (coronary artery disease)     stable stress test 7/11  . CHF (congestive heart failure)   . Hypertension   . Hyperlipidemia   . ED (erectile dysfunction)   . Allergic rhinitis   . GERD (gastroesophageal reflux disease)   . AAA (abdominal aortic aneurysm)     repaired 02/2002, renal ultrasound, Sept 2010: normal abdominal aorta, normal renal arteries     Past Surgical History  Procedure Date  . Cholecystectomy   . Inguinal hernia repair   . Aaa repair 2003  . Carotid ultrasound 03-2010    negative    Family History  Problem Relation Age of Onset  . Lung cancer    . Prostate cancer Neg Hx   . Diabetes Neg Hx   . Colon cancer Neg Hx     History   Social History  . Marital Status: Married    Spouse Name: N/A    Number of Children: N/A  . Years of Education: N/A   Occupational History  . Not on file.   Social History Main Topics  . Smoking status: Former Games developer  . Smokeless tobacco: Not on file  . Alcohol Use: No  . Drug Use: Not on file  . Sexually Active: Not on file   Other Topics Concern  . Not on file   Social History Narrative   WWII veteranDiet- healthyExercise- active if good weather Still drivesIndependent on his ADL     Allergies  Allergen Reactions  . Ace Inhibitors     REACTION: cough  . Aspirin     REACTION: nausea: GERD  . Spironolactone     REACTION: rash    Current Outpatient Prescriptions  Medication Sig Dispense Refill  . amitriptyline (ELAVIL) 10 MG tablet Take 10 mg by mouth at bedtime.        Marland Kitchen amLODipine (NORVASC) 10 MG tablet Take 10 mg by mouth  daily.        Marland Kitchen aspirin 81 MG chewable tablet Chew 81 mg by mouth daily.        Marland Kitchen atenolol (TENORMIN) 100 MG tablet TAKE ONE TABLET BY MOUTH ONCE DAILY.  30 tablet  3  . fish oil-omega-3 fatty acids 1000 MG capsule Take 2 g by mouth daily.        . furosemide (LASIX) 20 MG tablet Take 1 tablet (20 mg total) by mouth daily.  30 tablet  4  . gemfibrozil (LOPID) 600 MG tablet Take 600 mg by mouth daily.        Marland Kitchen KETOCONAZOLE, TOPICAL, 1 % SHAM Use as a regular shampoo 3 times a week  200 mL  0  . Multiple Vitamin (MULTIVITAMIN) capsule Take 1 capsule by mouth daily.        . Multiple Vitamins-Minerals (OCUVITE PRESERVISION PO) Take by mouth.        . niacin (NIASPAN) 500 MG CR tablet Take 500 mg by mouth at bedtime.        Marland Kitchen omeprazole (PRILOSEC) 20 MG capsule Take 20 mg by mouth daily.        . potassium chloride SA (  K-DUR,KLOR-CON) 20 MEQ tablet Take 20 mEq by mouth daily.        . simvastatin (ZOCOR) 10 MG tablet Take 10 mg by mouth at bedtime.          ROS Negative other than HPI.   PE General Appearance: well developed, well nourished in no acute distress, elderly HEENT: symmetrical face, PERRLA, good dentition  Neck: no JVD, thyromegaly, or adenopathy, trachea midline Chest: symmetric without deformity Cardiac: PMI non-displaced, RRR, normal S1, S2, no gallop or murmur Lung: clear to ausculation and percussion Vascular: diminished pulses in the lower extremities, dorsalis pedis 1+ over 4+ bilaterallyAbdominal: nondistended, nontender, good bowel sounds, no HSM, no bruits Extremities: no cyanosis, clubbing or edema, no sign of DVT, no varicosities  Skin: normal color, no rashes Neuro: alert and oriented x 3, non-focal Pysch: normal affect Filed Vitals:   07/04/11 0946  BP: 140/70  Pulse: 60  Height: 5\' 9"  (1.753 m)  Weight: 166 lb (75.297 kg)    EKG  Labs and Studies Reviewed.   Lab Results  Component Value Date   WBC 7.0 04/27/2011   HGB 13.9 04/27/2011   HCT 40.5  04/27/2011   MCV 94.1 04/27/2011   PLT 181.0 04/27/2011      Chemistry      Component Value Date/Time   NA 140 04/27/2011 0848   K 4.0 04/27/2011 0848   CL 104 04/27/2011 0848   CO2 30 04/27/2011 0848   BUN 12 04/27/2011 0848   CREATININE 1.1 04/27/2011 0848      Component Value Date/Time   CALCIUM 8.8 04/27/2011 0848   ALKPHOS 37* 07/18/2009 0916   AST 17 11/22/2010 1141   ALT 15 11/22/2010 1141   BILITOT 0.8 07/18/2009 0916       Lab Results  Component Value Date   CHOL 138 04/27/2011   CHOL 129 03/17/2010   CHOL 123 07/18/2009   Lab Results  Component Value Date   HDL 35.10* 04/27/2011   HDL 95.28* 03/17/2010   HDL 30.20* 07/18/2009   Lab Results  Component Value Date   LDLCALC 68 04/27/2011   LDLCALC 60 03/17/2010   LDLCALC 68 07/18/2009   Lab Results  Component Value Date   TRIG 174.0* 04/27/2011   TRIG 161.0* 03/17/2010   TRIG 122.0 07/18/2009   Lab Results  Component Value Date   CHOLHDL 4 04/27/2011   CHOLHDL 3 03/17/2010   CHOLHDL 4 07/18/2009   No results found for this basename: HGBA1C   Lab Results  Component Value Date   ALT 15 11/22/2010   AST 17 11/22/2010   ALKPHOS 37* 07/18/2009   BILITOT 0.8 07/18/2009   Lab Results  Component Value Date   TSH 1.91 03/17/2010

## 2011-07-04 NOTE — Assessment & Plan Note (Signed)
Stable. Continued medical therapy. 

## 2011-07-28 ENCOUNTER — Other Ambulatory Visit: Payer: Self-pay | Admitting: Internal Medicine

## 2011-07-31 NOTE — Telephone Encounter (Signed)
Rx Done . 

## 2011-08-31 ENCOUNTER — Ambulatory Visit: Payer: Medicare Other | Admitting: Internal Medicine

## 2011-09-19 ENCOUNTER — Encounter: Payer: Self-pay | Admitting: Internal Medicine

## 2011-09-19 ENCOUNTER — Ambulatory Visit (INDEPENDENT_AMBULATORY_CARE_PROVIDER_SITE_OTHER): Payer: Medicare Other | Admitting: Internal Medicine

## 2011-09-19 VITALS — BP 126/80 | HR 69 | Temp 97.9°F | Ht 68.0 in | Wt 161.6 lb

## 2011-09-19 DIAGNOSIS — I714 Abdominal aortic aneurysm, without rupture: Secondary | ICD-10-CM

## 2011-09-19 DIAGNOSIS — I509 Heart failure, unspecified: Secondary | ICD-10-CM

## 2011-09-19 DIAGNOSIS — R7309 Other abnormal glucose: Secondary | ICD-10-CM

## 2011-09-19 DIAGNOSIS — I10 Essential (primary) hypertension: Secondary | ICD-10-CM

## 2011-09-19 DIAGNOSIS — R739 Hyperglycemia, unspecified: Secondary | ICD-10-CM

## 2011-09-19 DIAGNOSIS — I739 Peripheral vascular disease, unspecified: Secondary | ICD-10-CM

## 2011-09-19 DIAGNOSIS — E785 Hyperlipidemia, unspecified: Secondary | ICD-10-CM

## 2011-09-19 DIAGNOSIS — Z23 Encounter for immunization: Secondary | ICD-10-CM

## 2011-09-19 DIAGNOSIS — I2510 Atherosclerotic heart disease of native coronary artery without angina pectoris: Secondary | ICD-10-CM

## 2011-09-19 LAB — BASIC METABOLIC PANEL
BUN: 12 mg/dL (ref 6–23)
CO2: 25 mEq/L (ref 19–32)
Calcium: 8.6 mg/dL (ref 8.4–10.5)
Chloride: 104 mEq/L (ref 96–112)
Creatinine, Ser: 1.2 mg/dL (ref 0.4–1.5)
GFR: 63.13 mL/min (ref >60.00)
Glucose, Bld: 105 mg/dL — ABNORMAL HIGH (ref 70–99)
Potassium: 3.9 mEq/L (ref 3.5–5.1)
Sodium: 138 mEq/L (ref 135–145)

## 2011-09-19 LAB — HEMOGLOBIN A1C: Hgb A1c MFr Bld: 4.9 % (ref 4.6–6.5)

## 2011-09-19 NOTE — Assessment & Plan Note (Signed)
There was a question of decreased carotid pulse, carotid  ultrasound 03-2010 was normal

## 2011-09-19 NOTE — Assessment & Plan Note (Signed)
Ultrasound 07-2009: showed normal aorta, iliac and renal arteries;  Kidneys normal

## 2011-09-19 NOTE — Assessment & Plan Note (Signed)
Stable

## 2011-09-19 NOTE — Assessment & Plan Note (Signed)
On triple therapy for a long time, fortunately he is tolerating well, CKs in the past have been negative. Cholesterol well controlled except for mild increase in the triglycerides.

## 2011-09-19 NOTE — Assessment & Plan Note (Signed)
Well controlled at present, check a BMP. Will also check a A1c, last blood sugar was slightly elevated.

## 2011-09-19 NOTE — Progress Notes (Signed)
  Subjective:    Patient ID: Christopher Hogan, male    DOB: 1925/02/08, 77 y.o.   MRN: [id number]  HPI ROV Last labs showed a slt elevated CBG  Past Medical History  Diagnosis Date  . CAD (coronary artery disease)     stable stress test 7/11  . CHF (congestive heart failure)   . Hypertension   . Hyperlipidemia   . ED (erectile dysfunction)   . Allergic rhinitis   . GERD (gastroesophageal reflux disease)   . AAA (abdominal aortic aneurysm)     repaired 02/2002, renal ultrasound, Sept 2010: normal abdominal aorta, normal renal arteries      Review of Systems Patient is a veteran, finally he went to visit [place] 2 months ago, had a great experience No chest pain or shortness of breath Medication compliance with all meds. No nausea, vomiting, diarrhea No myalgias.     Objective:   Physical Exam  Constitutional: He is oriented to person, place, and time. He appears well-developed and well-nourished.  HENT:  Head: Normocephalic and atraumatic.  Cardiovascular: Normal rate, regular rhythm and normal heart sounds.   No murmur heard. Pulmonary/Chest: Effort normal and breath sounds normal. No respiratory distress. He has no wheezes. He has no rales.  Musculoskeletal: He exhibits no edema.  Neurological: He is alert and oriented to person, place, and time.          Assessment & Plan:

## 2011-09-19 NOTE — Assessment & Plan Note (Signed)
Well controlled 

## 2011-09-24 ENCOUNTER — Telehealth: Payer: Self-pay

## 2011-09-24 NOTE — Telephone Encounter (Signed)
Patient advised.

## 2011-10-18 ENCOUNTER — Other Ambulatory Visit: Payer: Self-pay | Admitting: Internal Medicine

## 2011-10-18 MED ORDER — FUROSEMIDE 20 MG PO TABS: 20.0000 mg | ORAL_TABLET | Freq: Every day | ORAL | Status: DC

## 2011-10-18 NOTE — Telephone Encounter (Signed)
done

## 2011-11-19 ENCOUNTER — Other Ambulatory Visit: Payer: Self-pay | Admitting: Internal Medicine

## 2011-11-19 MED ORDER — FUROSEMIDE 20 MG PO TABS: 20.0000 mg | ORAL_TABLET | Freq: Every day | ORAL | Status: DC

## 2011-11-19 MED ORDER — ATENOLOL 100 MG PO TABS: 100.0000 mg | ORAL_TABLET | Freq: Every day | ORAL | Status: DC

## 2011-11-19 NOTE — Telephone Encounter (Signed)
Faxed.   KP 

## 2011-12-18 ENCOUNTER — Ambulatory Visit (INDEPENDENT_AMBULATORY_CARE_PROVIDER_SITE_OTHER): Payer: Medicare Other | Admitting: Ophthalmology

## 2011-12-18 DIAGNOSIS — H43819 Vitreous degeneration, unspecified eye: Secondary | ICD-10-CM

## 2011-12-18 DIAGNOSIS — H3560 Retinal hemorrhage, unspecified eye: Secondary | ICD-10-CM

## 2011-12-18 DIAGNOSIS — H3530 Unspecified macular degeneration: Secondary | ICD-10-CM | POA: Diagnosis not present

## 2011-12-19 DIAGNOSIS — H4011X0 Primary open-angle glaucoma, stage unspecified: Secondary | ICD-10-CM | POA: Diagnosis not present

## 2011-12-19 DIAGNOSIS — H409 Unspecified glaucoma: Secondary | ICD-10-CM | POA: Diagnosis not present

## 2011-12-19 DIAGNOSIS — H3530 Unspecified macular degeneration: Secondary | ICD-10-CM | POA: Diagnosis not present

## 2011-12-19 DIAGNOSIS — Z961 Presence of intraocular lens: Secondary | ICD-10-CM | POA: Diagnosis not present

## 2011-12-20 ENCOUNTER — Telehealth: Payer: Self-pay | Admitting: Internal Medicine

## 2011-12-20 NOTE — Telephone Encounter (Signed)
Lasix 20mg   Pharmacy comments: patient wants #90 but on 60 remain.

## 2011-12-24 MED ORDER — FUROSEMIDE 20 MG PO TABS: 20.0000 mg | ORAL_TABLET | Freq: Every day | ORAL | Status: DC

## 2011-12-24 NOTE — Telephone Encounter (Signed)
Refill done.  

## 2011-12-31 ENCOUNTER — Ambulatory Visit (INDEPENDENT_AMBULATORY_CARE_PROVIDER_SITE_OTHER): Payer: Medicare Other | Admitting: Internal Medicine

## 2011-12-31 ENCOUNTER — Encounter: Payer: Self-pay | Admitting: Internal Medicine

## 2011-12-31 DIAGNOSIS — H409 Unspecified glaucoma: Secondary | ICD-10-CM | POA: Insufficient documentation

## 2011-12-31 DIAGNOSIS — Z23 Encounter for immunization: Secondary | ICD-10-CM | POA: Diagnosis not present

## 2011-12-31 DIAGNOSIS — I2510 Atherosclerotic heart disease of native coronary artery without angina pectoris: Secondary | ICD-10-CM | POA: Diagnosis not present

## 2011-12-31 DIAGNOSIS — I10 Essential (primary) hypertension: Secondary | ICD-10-CM | POA: Diagnosis not present

## 2011-12-31 DIAGNOSIS — Z0000 Encounter for general adult medical examination without abnormal findings: Secondary | ICD-10-CM | POA: Insufficient documentation

## 2011-12-31 DIAGNOSIS — Z125 Encounter for screening for malignant neoplasm of prostate: Secondary | ICD-10-CM

## 2011-12-31 DIAGNOSIS — Z01810 Encounter for preprocedural cardiovascular examination: Secondary | ICD-10-CM

## 2011-12-31 DIAGNOSIS — K4090 Unilateral inguinal hernia, without obstruction or gangrene, not specified as recurrent: Secondary | ICD-10-CM | POA: Diagnosis not present

## 2011-12-31 DIAGNOSIS — E785 Hyperlipidemia, unspecified: Secondary | ICD-10-CM | POA: Diagnosis not present

## 2011-12-31 HISTORY — DX: Encounter for general adult medical examination without abnormal findings: Z00.00

## 2011-12-31 HISTORY — DX: Unilateral inguinal hernia, without obstruction or gangrene, not specified as recurrent: K40.90

## 2011-12-31 HISTORY — DX: Unspecified glaucoma: H40.9

## 2011-12-31 HISTORY — DX: Encounter for preprocedural cardiovascular examination: Z01.810

## 2011-12-31 LAB — BASIC METABOLIC PANEL
BUN: 13 mg/dL (ref 6–23)
CO2: 27 mEq/L (ref 19–32)
Calcium: 8.7 mg/dL (ref 8.4–10.5)
Chloride: 103 mEq/L (ref 96–112)
Creatinine, Ser: 1.1 mg/dL (ref 0.4–1.5)
GFR: 71.48 mL/min (ref >60.00)
Glucose, Bld: 99 mg/dL (ref 70–99)
Potassium: 4.1 mEq/L (ref 3.5–5.1)
Sodium: 136 mEq/L (ref 135–145)

## 2011-12-31 LAB — LIPID PANEL
Cholesterol: 121 mg/dL (ref 0–200)
HDL: 38.8 mg/dL — ABNORMAL LOW (ref >39.00)
LDL Cholesterol: 61 mg/dL (ref 0–99)
Total CHOL/HDL Ratio: 3
Triglycerides: 105 mg/dL (ref 0.0–149.0)
VLDL: 21 mg/dL (ref 0.0–40.0)

## 2011-12-31 LAB — ALT: ALT: 19 U/L (ref 0–53)

## 2011-12-31 LAB — AST: AST: 18 U/L (ref 0–37)

## 2011-12-31 LAB — PSA: PSA: 1.21 ng/mL (ref 0.10–4.00)

## 2011-12-31 MED ORDER — AMITRIPTYLINE HCL 10 MG PO TABS: 10.0000 mg | ORAL_TABLET | Freq: Every day | ORAL | Status: DC

## 2011-12-31 MED ORDER — ATENOLOL 100 MG PO TABS: 100.0000 mg | ORAL_TABLET | Freq: Every day | ORAL | Status: DC

## 2011-12-31 MED ORDER — FUROSEMIDE 20 MG PO TABS: 20.0000 mg | ORAL_TABLET | Freq: Every day | ORAL | Status: DC

## 2011-12-31 MED ORDER — ZOSTER VACCINE LIVE 19400 UNT/0.65ML ~~LOC~~ SOLR: 0.6500 mL | Freq: Once | SUBCUTANEOUS | Status: AC

## 2011-12-31 NOTE — Assessment & Plan Note (Signed)
good medication compliance, labs

## 2011-12-31 NOTE — Assessment & Plan Note (Addendum)
Large , stable inguinal area Discussed w/ pt referral vs continue observation Elected observation, ER if he ever has incarceration sx

## 2011-12-31 NOTE — Assessment & Plan Note (Signed)
asx

## 2011-12-31 NOTE — Assessment & Plan Note (Addendum)
Td 2000 and today pneumonia shot 2007 shingles shot , Rx provided, benefits discussed   never Cscope, iFOB provided  DRE normal, previous PSAs normal. PSA today (if medicare allows) .After this year, and no further screening. Diet- exercise discussed.

## 2011-12-31 NOTE — Assessment & Plan Note (Signed)
Stable, labs

## 2011-12-31 NOTE — Progress Notes (Signed)
  Subjective:    Patient ID: Christopher Hogan, male    DOB: 1925-05-27, 78 y.o.   MRN: [id number]  HPI Here for Medicare AWV:  1. Risk factors based on Past M, S, F history: reviewed 2. Physical Activities: walks 3 times a week weather permit 3. Depression/mood:  No problemss noted or reported  4. Hearing: L hearing decreased, offered a referral to audiologist,  Some problems noted by wife as well 5. ADL's: still drives , lives w/ wife completely independent  6. Fall Risk: no recent falls, precautions discussed   7. home Safety: does feel safe at home  8. Height, weight, &visual acuity: see VS, recently diagnosed with glaucoma, sees ophthalmology routinely 9. Counseling: provided 10. Labs ordered based on risk factors: if needed  11. Referral Coordination: if needed 12.  Care Plan, see assessment and plan  13.   Cognitive Assessment: Cognition and motor skills normal for age  In addition, today we discussed the following: HTN--good medication compliance High cholesterol, good medication compliance, no apparent side effects. CAD, per last cardiology note, patient is  stable , he takes 2 aspirins,  mild epigastric discomfort  after he takes them. See review of systems. Also has a long history of a left inguinal hernia. Occasional pain. Has been present for more than 30 years and is not getting bigger.   Past Medical History: Hypertension Hyperlipidemia CV: ---CAD ---Congestive heart failure    --- h/o AAA-repaired 02/2002 ---renal ultrasound, September 2010: Nl Abd Ao and renal arteries ---carotid u/s 0-39% 03-2010  ED Allergic rhinitis GERD L inguinal hernia  Glaucoma  R Past Surgical History: Cholecystectomy Inguinal herniorrhaphy, R AAA repair 2003  Family History: Lung cancer-- F smoker prostate cancer -- no Colon ca--no heart disease--no diabetes--no   Social History: Married, 3 children, 2 live close by [employer] tobacco 1953 Alcohol use-no  [employer] veteran diet--  healthy exercise-- active if good weather  still drives  independent on his ADL   Review of Systems Denies abdominal pain, vomiting, diarrhea. No change in the color of the stools. No chest pain, shortness of breath or lower extremity edema. No dysuria or gross hematuria.     Objective:   Physical Exam  Constitutional: He is oriented to person, place, and time. He appears well-developed and well-nourished. No distress.  HENT:  Head: Normocephalic and atraumatic.  Cardiovascular: Normal rate, regular rhythm and normal heart sounds.   No murmur heard. Pulmonary/Chest: Effort normal and breath sounds normal. No respiratory distress. He has no wheezes. He has no rales.  Abdominal: Soft. He exhibits no distension. There is no tenderness. There is no rebound and no guarding.    Genitourinary: Rectum normal and prostate normal.       + ext hemorrhoids   Musculoskeletal: He exhibits no edema.  Neurological: He is alert and oriented to person, place, and time.  Skin: He is not diaphoretic.          Assessment & Plan:

## 2012-01-01 ENCOUNTER — Encounter: Payer: Self-pay | Admitting: *Deleted

## 2012-01-03 DIAGNOSIS — Z961 Presence of intraocular lens: Secondary | ICD-10-CM | POA: Diagnosis not present

## 2012-01-03 DIAGNOSIS — H409 Unspecified glaucoma: Secondary | ICD-10-CM | POA: Diagnosis not present

## 2012-01-03 DIAGNOSIS — H3530 Unspecified macular degeneration: Secondary | ICD-10-CM | POA: Diagnosis not present

## 2012-01-03 DIAGNOSIS — H4011X0 Primary open-angle glaucoma, stage unspecified: Secondary | ICD-10-CM | POA: Diagnosis not present

## 2012-01-05 ENCOUNTER — Encounter: Payer: Self-pay | Admitting: Internal Medicine

## 2012-01-09 ENCOUNTER — Other Ambulatory Visit: Payer: Medicare Other

## 2012-01-09 DIAGNOSIS — Z5181 Encounter for therapeutic drug level monitoring: Secondary | ICD-10-CM

## 2012-01-09 LAB — FECAL OCCULT BLOOD, IMMUNOCHEMICAL: Fecal Occult Bld: NEGATIVE

## 2012-02-28 DIAGNOSIS — H3530 Unspecified macular degeneration: Secondary | ICD-10-CM | POA: Diagnosis not present

## 2012-02-28 DIAGNOSIS — H409 Unspecified glaucoma: Secondary | ICD-10-CM | POA: Diagnosis not present

## 2012-02-28 DIAGNOSIS — H4011X0 Primary open-angle glaucoma, stage unspecified: Secondary | ICD-10-CM | POA: Diagnosis not present

## 2012-02-28 DIAGNOSIS — Z961 Presence of intraocular lens: Secondary | ICD-10-CM | POA: Diagnosis not present

## 2012-04-07 ENCOUNTER — Telehealth: Payer: Self-pay | Admitting: *Deleted

## 2012-04-07 MED ORDER — GEMFIBROZIL 600 MG PO TABS: 600.0000 mg | ORAL_TABLET | Freq: Every day | ORAL | Status: DC

## 2012-04-07 NOTE — Telephone Encounter (Signed)
Discuss with patient, Rx sent. 

## 2012-04-07 NOTE — Telephone Encounter (Signed)
Pt would like to know if he needs to continue on the gemfibrozil and if so he needs a refill. Pt notes that he usually get this at the [place]. Marland KitchenPlease advise

## 2012-04-07 NOTE — Telephone Encounter (Signed)
Needs gemfibrozil long-term, ok #90 days and 3 RF

## 2012-04-10 DIAGNOSIS — H4011X0 Primary open-angle glaucoma, stage unspecified: Secondary | ICD-10-CM | POA: Diagnosis not present

## 2012-04-10 DIAGNOSIS — H409 Unspecified glaucoma: Secondary | ICD-10-CM | POA: Diagnosis not present

## 2012-04-10 DIAGNOSIS — Z961 Presence of intraocular lens: Secondary | ICD-10-CM | POA: Diagnosis not present

## 2012-04-21 ENCOUNTER — Other Ambulatory Visit: Payer: Self-pay | Admitting: Internal Medicine

## 2012-04-23 ENCOUNTER — Telehealth: Payer: Self-pay | Admitting: *Deleted

## 2012-04-23 MED ORDER — FLUTICASONE PROPIONATE 50 MCG/ACT NA SUSP: 2.0000 | Freq: Every day | NASAL | Status: DC

## 2012-04-23 NOTE — Telephone Encounter (Signed)
Pt was given sample of Qnasl and would like to Rx sent in to the pharmacy for med. .Please advise

## 2012-04-23 NOTE — Telephone Encounter (Signed)
Discuss with patient would like to try the flonase. Rx sent.

## 2012-04-23 NOTE — Telephone Encounter (Signed)
Qnsal 2 sprays in each side of the nose daily.#1, 6 RF It is very expensive, he may like to try Flonase

## 2012-04-25 NOTE — Telephone Encounter (Signed)
Refill done.  

## 2012-05-14 ENCOUNTER — Ambulatory Visit (INDEPENDENT_AMBULATORY_CARE_PROVIDER_SITE_OTHER): Payer: Medicare Other | Admitting: Internal Medicine

## 2012-05-14 ENCOUNTER — Telehealth: Payer: Self-pay | Admitting: Internal Medicine

## 2012-05-14 ENCOUNTER — Encounter: Payer: Self-pay | Admitting: Internal Medicine

## 2012-05-14 VITALS — BP 124/72 | HR 56 | Temp 98.0°F | Wt 166.0 lb

## 2012-05-14 DIAGNOSIS — J309 Allergic rhinitis, unspecified: Secondary | ICD-10-CM

## 2012-05-14 DIAGNOSIS — E785 Hyperlipidemia, unspecified: Secondary | ICD-10-CM | POA: Diagnosis not present

## 2012-05-14 MED ORDER — ATENOLOL 100 MG PO TABS: 100.0000 mg | ORAL_TABLET | Freq: Every day | ORAL | Status: DC

## 2012-05-14 MED ORDER — BECLOMETHASONE DIPROPIONATE 80 MCG/ACT NA AERS: 2.0000 | INHALATION_SPRAY | Freq: Every day | NASAL | Status: DC

## 2012-05-14 MED ORDER — SIMVASTATIN 10 MG PO TABS: 10.0000 mg | ORAL_TABLET | Freq: Every day | ORAL | Status: AC

## 2012-05-14 NOTE — Assessment & Plan Note (Signed)
Needs a new prescription for simvastatin 10 mg. Done.

## 2012-05-14 NOTE — Telephone Encounter (Signed)
Refill done.  

## 2012-05-14 NOTE — Telephone Encounter (Signed)
Refill: Atenolol 100mg  tab. Take one tablet by mouth every day. Qty 90. Last fill 02-19-12

## 2012-05-14 NOTE — Assessment & Plan Note (Signed)
Qnsal helped persistent runny nose. Unable to tolerate generic nasal steroids. Plan, rf qnsal

## 2012-05-14 NOTE — Patient Instructions (Addendum)
Continue using the cream (clotrimazole betamethasone) twice a day for a few days. If the area changes in color, keep itching, or the near by skin lesion bleeds or gets dark---> let me know.

## 2012-05-14 NOTE — Progress Notes (Signed)
  Subjective:    Patient ID: Christopher Hogan, male    DOB: 04-09-1925, 78 y.o.   MRN: [id number]  HPI Acute visit Has a spot at the back that is quite itchy, his wife put a cream last night that helped. He try Qnsal for runny nose, it worked very good. Later on he was prescribed a generic nose spray and it make him sick. Request samples of qnsal. High cholesterol, needs a new prescription for simvastatin.   Past Medical History: Hypertension Hyperlipidemia CV: ---CAD ---Congestive heart failure     --- h/o AAA-repaired 02/2002 ---renal ultrasound, September 2010: Nl Abd Ao and renal arteries ---carotid u/s 0-39% 03-2010   ED Allergic rhinitis GERD L inguinal hernia   Glaucoma  R Past Surgical History: Cholecystectomy Inguinal herniorrhaphy, R AAA repair 2003  Family History: Lung cancer-- F smoker prostate cancer -- no Colon ca--no heart disease--no diabetes--no    Social History: Married, 3 children, 2 live close by [employer] tobacco 1953 Alcohol use-no  [employer] veteran diet-- healthy exercise-- active if good weather   still drives   independent on his ADL    Review of Systems Afebrile, no sinus pain or congestion. Area of concern in the back with no bleeding, he's wife reports no change in color.    Objective:   Physical Exam  Constitutional: He appears well-developed and well-nourished. No distress.  Skin: He is not diaphoretic.     Psychiatric: He has a normal mood and affect. His behavior is normal. Judgment and thought content normal.          Assessment & Plan:  Itchy spot at the back, Exam is essentially normal, he uses clotrimazole betamethasone subjective improvement. See instructions

## 2012-07-09 DIAGNOSIS — H3530 Unspecified macular degeneration: Secondary | ICD-10-CM | POA: Diagnosis not present

## 2012-07-09 DIAGNOSIS — Z961 Presence of intraocular lens: Secondary | ICD-10-CM | POA: Diagnosis not present

## 2012-07-09 DIAGNOSIS — H4011X0 Primary open-angle glaucoma, stage unspecified: Secondary | ICD-10-CM | POA: Diagnosis not present

## 2012-07-09 DIAGNOSIS — H409 Unspecified glaucoma: Secondary | ICD-10-CM | POA: Diagnosis not present

## 2012-08-08 ENCOUNTER — Telehealth: Payer: Self-pay | Admitting: *Deleted

## 2012-08-08 NOTE — Telephone Encounter (Signed)
Pt called requesting samples of Qnasl. Please advise.

## 2012-08-08 NOTE — Telephone Encounter (Signed)
Ok if available

## 2012-08-08 NOTE — Telephone Encounter (Signed)
Pt aware sample is ready to be picked up at front desk.

## 2012-08-14 ENCOUNTER — Ambulatory Visit (INDEPENDENT_AMBULATORY_CARE_PROVIDER_SITE_OTHER): Payer: Medicare Other | Admitting: Cardiology

## 2012-08-14 ENCOUNTER — Encounter: Payer: Self-pay | Admitting: Cardiology

## 2012-08-14 VITALS — BP 120/70 | HR 57 | Ht 69.0 in | Wt 162.0 lb

## 2012-08-14 DIAGNOSIS — I10 Essential (primary) hypertension: Secondary | ICD-10-CM | POA: Diagnosis not present

## 2012-08-14 DIAGNOSIS — E785 Hyperlipidemia, unspecified: Secondary | ICD-10-CM | POA: Diagnosis not present

## 2012-08-14 DIAGNOSIS — I2510 Atherosclerotic heart disease of native coronary artery without angina pectoris: Secondary | ICD-10-CM

## 2012-08-14 MED ORDER — ATENOLOL 50 MG PO TABS: 50.0000 mg | ORAL_TABLET | Freq: Every day | ORAL | Status: DC

## 2012-08-14 NOTE — Patient Instructions (Addendum)
Your physician has recommended you make the following change in your medication:  Decrease Atenolol to 50 mg daily  Your physician wants you to follow-up in: 1 year with Dr. Daleen Squibb.  You will receive a reminder letter in the mail two months in advance. If you don't receive a letter, please call our office to schedule the follow-up appointment.

## 2012-08-14 NOTE — Progress Notes (Signed)
HPI Christopher Hogan comes in today for evaluation and management of his history of coronary artery disease, history of hypertension, and history of hyperlipidemia.  He's doing well with no complaints today. He specifically denies any angina, chest pain, palpitations, presyncope, orthopnea, PND or edema.  Past Medical History  Diagnosis Date  . CAD (coronary artery disease)     stable stress test 7/11  . CHF (congestive heart failure)   . Hypertension   . Hyperlipidemia   . ED (erectile dysfunction)   . Allergic rhinitis   . GERD (gastroesophageal reflux disease)   . AAA (abdominal aortic aneurysm)     repaired 02/2002, renal ultrasound, Sept 2010: normal abdominal aorta, normal renal arteries     Current Outpatient Prescriptions  Medication Sig Dispense Refill  . amitriptyline (ELAVIL) 10 MG tablet Take 1 tablet (10 mg total) by mouth at bedtime.  90 tablet  3  . amLODipine (NORVASC) 10 MG tablet Take 10 mg by mouth daily.        Marland Kitchen aspirin 81 MG chewable tablet Chew 162 mg by mouth daily.       Marland Kitchen atenolol (TENORMIN) 50 MG tablet Take 1 tablet (50 mg total) by mouth daily.  90 tablet  3  . Beclomethasone Dipropionate (QNASL) 80 MCG/ACT AERS Place 2 puffs into the nose daily.  1 Inhaler  6  . clotrimazole-betamethasone (LOTRISONE) cream USE AS DIRECTED. IF RASH PERSISTS NEEDS APPOINTMENT  15 g  0  . Dorzolamide HCl-Timolol Mal PF 22.3-6.8 MG/ML SOLN Apply 1 drop to eye 2 (two) times daily.      . fish oil-omega-3 fatty acids 1000 MG capsule Take 1 g by mouth daily.       . fluticasone (FLONASE) 50 MCG/ACT nasal spray Place 2 sprays into the nose daily.  16 g  6  . furosemide (LASIX) 20 MG tablet Take 1 tablet (20 mg total) by mouth daily.  90 tablet  3  . gemfibrozil (LOPID) 600 MG tablet Take 1 tablet (600 mg total) by mouth daily.  90 tablet  3  . latanoprost (XALATAN) 0.005 % ophthalmic solution Place 1 drop into both eyes at bedtime.      . Multiple Vitamin (MULTIVITAMIN) capsule Take 1  capsule by mouth daily.        . Multiple Vitamins-Minerals (OCUVITE PRESERVISION PO) Take by mouth.        . niacin (NIASPAN) 500 MG CR tablet Take 500 mg by mouth at bedtime.        Marland Kitchen omeprazole (PRILOSEC) 20 MG capsule Take 20 mg by mouth daily.        . potassium chloride SA (K-DUR,KLOR-CON) 20 MEQ tablet Take 20 mEq by mouth daily.        . simvastatin (ZOCOR) 10 MG tablet Take 1 tablet (10 mg total) by mouth at bedtime.  90 tablet  3  . DISCONTD: atenolol (TENORMIN) 100 MG tablet Take 1 tablet (100 mg total) by mouth daily.  90 tablet  2    Allergies  Allergen Reactions  . Ace Inhibitors     REACTION: cough  . Aspirin     REACTION: nausea: GERD  . Spironolactone     REACTION: rash    Family History  Problem Relation Age of Onset  . Lung cancer    . Prostate cancer Neg Hx   . Diabetes Neg Hx   . Colon cancer Neg Hx     History   Social History  . Marital Status:  Married    Spouse Name: N/A    Number of Children: N/A  . Years of Education: N/A   Occupational History  . Not on file.   Social History Main Topics  . Smoking status: Former Games developer  . Smokeless tobacco: Not on file  . Alcohol Use: No  . Drug Use: Not on file  . Sexually Active: Not on file   Other Topics Concern  . Not on file   Social History Narrative   WWII veteranDiet- healthyExercise- active if good weather Still drivesIndependent on his ADL     ROS ALL NEGATIVE EXCEPT THOSE NOTED IN HPI  PE  General Appearance: well developed, well nourished in no acute distress HEENT: symmetrical face, PERRLA, good dentition  Neck: no JVD, thyromegaly, or adenopathy, trachea midline Chest: symmetric without deformity Cardiac: PMI non-displaced, RRR, normal S1, S2, no gallop or murmur Lung: clear to ausculation and percussion Vascular: all pulses full without bruits  Abdominal: nondistended, nontender, good bowel sounds, no HSM, no bruits Extremities: no cyanosis, clubbing or edema, no sign of DVT,  no varicosities  Skin: normal color, no rashes Neuro: alert and oriented x 3, non-focal Pysch: normal affect  EKG  BMET    Component Value Date/Time   NA 136 12/31/2011 1052   K 4.1 12/31/2011 1052   CL 103 12/31/2011 1052   CO2 27 12/31/2011 1052   GLUCOSE 99 12/31/2011 1052   BUN 13 12/31/2011 1052   CREATININE 1.1 12/31/2011 1052   CALCIUM 8.7 12/31/2011 1052   GFRNONAA 75.95 03/17/2010 0000   GFRAA 104 01/12/2009 1327    Lipid Panel     Component Value Date/Time   CHOL 121 12/31/2011 1052   TRIG 105.0 12/31/2011 1052   HDL 38.80* 12/31/2011 1052   CHOLHDL 3 12/31/2011 1052   VLDL 21.0 12/31/2011 1052   LDLCALC 61 12/31/2011 1052    CBC    Component Value Date/Time   WBC 7.0 04/27/2011 0848   RBC 4.30 04/27/2011 0848   HGB 13.9 04/27/2011 0848   HCT 40.5 04/27/2011 0848   PLT 181.0 04/27/2011 0848   MCV 94.1 04/27/2011 0848   MCHC 34.3 04/27/2011 0848   RDW 14.5 04/27/2011 0848   LYMPHSABS 1.6 04/27/2011 0848   MONOABS 0.7 04/27/2011 0848   EOSABS 0.3 04/27/2011 0848   BASOSABS 0.1 04/27/2011 0848

## 2012-08-14 NOTE — Assessment & Plan Note (Signed)
Stable. Continue current medical therapy and secondary prevention. 

## 2012-11-25 ENCOUNTER — Ambulatory Visit (INDEPENDENT_AMBULATORY_CARE_PROVIDER_SITE_OTHER): Payer: Medicare Other | Admitting: Internal Medicine

## 2012-11-25 ENCOUNTER — Encounter: Payer: Self-pay | Admitting: Internal Medicine

## 2012-11-25 VITALS — BP 138/76 | HR 68 | Temp 97.4°F | Wt 168.0 lb

## 2012-11-25 DIAGNOSIS — J069 Acute upper respiratory infection, unspecified: Secondary | ICD-10-CM | POA: Diagnosis not present

## 2012-11-25 MED ORDER — AMOXICILLIN 500 MG PO CAPS: 1000.0000 mg | ORAL_CAPSULE | Freq: Two times a day (BID) | ORAL | Status: DC

## 2012-11-25 MED ORDER — AZELASTINE HCL 0.1 % NA SOLN: 2.0000 | Freq: Every evening | NASAL | Status: DC | PRN

## 2012-11-25 NOTE — Patient Instructions (Signed)
Rest, fluids , tylenol For cough, take Mucinex DM twice a day as needed  For congestion use astelin nasal spray once a day until you feel better Take the antibiotic as prescribed  (Amoxicillin) Call if no better in few days Call anytime if the symptoms are severe ---- Please schedule a physical exam next month, fasting

## 2012-11-25 NOTE — Progress Notes (Signed)
  Subjective:    Patient ID: Christopher Hogan, male    DOB: 09-Jan-1925, 79 y.o.   MRN: [id number]  HPI Acute visit 2 weeks history of sinus and facial congestion, postnasal dripping, cough with some sputum production, patient thinks related to the postnasal dripping. Has not taken any specific medication for his symptoms.  Past Medical History  Diagnosis Date  . CAD (coronary artery disease)     stable stress test 7/11  . CHF (congestive heart failure)   . Hypertension   . Hyperlipidemia   . ED (erectile dysfunction)   . Allergic rhinitis   . GERD (gastroesophageal reflux disease)   . AAA (abdominal aortic aneurysm)     repaired 02/2002, renal ultrasound, Sept 2010: normal abdominal aorta, normal renal arteries     Past Surgical History  Procedure Date  . Cholecystectomy   . Inguinal hernia repair   . Aaa repair 2003  . Carotid ultrasound 03-2010    negative   Social History:  Married, 3 children, 2 live close by  Quit tobacco 1953  Alcohol use-no  [employer] veteran    Review of Systems No fever or chills Mild chest congestion one night otherwise no wheezing. Denies nausea, vomiting, diarrhea or myalgias.     Objective:   Physical Exam General -- alert, well-developed, and well-nourished.   HEENT -- TMs normal, throat w/o redness, face symmetric and not tender to palpation, nose with moderate congestion  Lungs --slightly decreased breath sounds, a few rhonchi. No crackles or wheezing   Heart-- normal rate, regular rhythm, no murmur, and no gallop.   Psych-- Cognition and judgment appear intact. Alert and cooperative with normal attention span and concentration.  not anxious appearing and not depressed appearing.      Assessment & Plan:   URI, early sinusitis? See instructions

## 2012-12-18 ENCOUNTER — Ambulatory Visit (INDEPENDENT_AMBULATORY_CARE_PROVIDER_SITE_OTHER): Payer: Medicare Other | Admitting: Ophthalmology

## 2012-12-26 ENCOUNTER — Ambulatory Visit (INDEPENDENT_AMBULATORY_CARE_PROVIDER_SITE_OTHER): Payer: Medicare Other | Admitting: Ophthalmology

## 2012-12-26 DIAGNOSIS — I10 Essential (primary) hypertension: Secondary | ICD-10-CM | POA: Diagnosis not present

## 2012-12-26 DIAGNOSIS — H43819 Vitreous degeneration, unspecified eye: Secondary | ICD-10-CM | POA: Diagnosis not present

## 2012-12-26 DIAGNOSIS — H3530 Unspecified macular degeneration: Secondary | ICD-10-CM

## 2012-12-26 DIAGNOSIS — H35039 Hypertensive retinopathy, unspecified eye: Secondary | ICD-10-CM | POA: Diagnosis not present

## 2013-01-06 ENCOUNTER — Encounter: Payer: Self-pay | Admitting: Internal Medicine

## 2013-01-06 ENCOUNTER — Ambulatory Visit (INDEPENDENT_AMBULATORY_CARE_PROVIDER_SITE_OTHER): Payer: Medicare Other | Admitting: Internal Medicine

## 2013-01-06 VITALS — BP 126/74 | HR 63 | Temp 97.5°F | Ht 68.0 in | Wt 163.0 lb

## 2013-01-06 DIAGNOSIS — J309 Allergic rhinitis, unspecified: Secondary | ICD-10-CM

## 2013-01-06 DIAGNOSIS — I10 Essential (primary) hypertension: Secondary | ICD-10-CM

## 2013-01-06 DIAGNOSIS — E785 Hyperlipidemia, unspecified: Secondary | ICD-10-CM | POA: Diagnosis not present

## 2013-01-06 DIAGNOSIS — Z0000 Encounter for general adult medical examination without abnormal findings: Secondary | ICD-10-CM | POA: Diagnosis not present

## 2013-01-06 DIAGNOSIS — Z23 Encounter for immunization: Secondary | ICD-10-CM

## 2013-01-06 LAB — LIPID PANEL
Cholesterol: 112 mg/dL (ref 0–200)
HDL: 35.2 mg/dL — ABNORMAL LOW (ref >39.00)
LDL Cholesterol: 50 mg/dL (ref 0–99)
Total CHOL/HDL Ratio: 3
Triglycerides: 133 mg/dL (ref 0.0–149.0)
VLDL: 26.6 mg/dL (ref 0.0–40.0)

## 2013-01-06 LAB — CBC WITH DIFFERENTIAL/PLATELET
Basophils Absolute: 0.1 10*3/uL (ref 0.0–0.1)
Basophils Relative: 0.9 % (ref 0.0–3.0)
Eosinophils Absolute: 0.2 10*3/uL (ref 0.0–0.7)
Eosinophils Relative: 3.8 % (ref 0.0–5.0)
HCT: 39.9 % (ref 39.0–52.0)
Hemoglobin: 13.4 g/dL (ref 13.0–17.0)
Lymphocytes Relative: 25.7 % (ref 12.0–46.0)
Lymphs Abs: 1.6 10*3/uL (ref 0.7–4.0)
MCHC: 33.5 g/dL (ref 30.0–36.0)
MCV: 94.4 fl (ref 78.0–100.0)
Monocytes Absolute: 0.6 10*3/uL (ref 0.1–1.0)
Monocytes Relative: 10.3 % (ref 3.0–12.0)
Neutro Abs: 3.7 10*3/uL (ref 1.4–7.7)
Neutrophils Relative %: 59.3 % (ref 43.0–77.0)
Platelets: 200 10*3/uL (ref 150.0–400.0)
RBC: 4.23 Mil/uL (ref 4.22–5.81)
RDW: 14.7 % — ABNORMAL HIGH (ref 11.5–14.6)
WBC: 6.3 10*3/uL (ref 4.5–10.5)

## 2013-01-06 LAB — COMPREHENSIVE METABOLIC PANEL
ALT: 15 U/L (ref 0–53)
AST: 17 U/L (ref 0–37)
Albumin: 4 g/dL (ref 3.5–5.2)
Alkaline Phosphatase: 51 U/L (ref 39–117)
BUN: 9 mg/dL (ref 6–23)
CO2: 27 mEq/L (ref 19–32)
Calcium: 8.6 mg/dL (ref 8.4–10.5)
Chloride: 102 mEq/L (ref 96–112)
Creatinine, Ser: 1 mg/dL (ref 0.4–1.5)
GFR: 72.9 mL/min (ref >60.00)
Glucose, Bld: 89 mg/dL (ref 70–99)
Potassium: 3.3 mEq/L — ABNORMAL LOW (ref 3.5–5.1)
Sodium: 137 mEq/L (ref 135–145)
Total Bilirubin: 0.4 mg/dL (ref 0.3–1.2)
Total Protein: 6.8 g/dL (ref 6.0–8.3)

## 2013-01-06 LAB — TSH: TSH: 0.66 u[IU]/mL (ref 0.35–5.50)

## 2013-01-06 MED ORDER — FLUTICASONE PROPIONATE 50 MCG/ACT NA SUSP: 2.0000 | Freq: Every day | NASAL | Status: DC

## 2013-01-06 NOTE — Progress Notes (Signed)
Subjective:    Patient ID: Christopher Hogan, male    DOB: [birth year], 79 y.o.   MRN: [id number]  HPI  Here for Medicare AWV: 1. Risk factors based on Past M, S, F history: reviewed 2. Physical Activities: will re-start his walking program when weather permits 3. Depression/mood:  (-) screening 4. Hearing: L hearing decreased, has a hearing aid 5. ADL's:  Drives very little, lives w/ wife completely independent   6. Fall Risk: no recent falls, see instructions   7. home Safety: does feel safe at home   8. Height, weight, &visual acuity: see VS, recently diagnosed with glaucoma, has difficulties reading, sees ophthalmology routinely 9. Counseling: provided 10. Labs ordered based on risk factors: if needed   11. Referral Coordination: if needed 12.  Care Plan, see assessment and plan   13.   Cognitive Assessment: Cognition and motor skills normal for age  In addition, today we discussed the following: Insomnia, well-controlled with Elavil when necessary High cholesterol, good compliance with medication. Hypertension, good compliance with medication, ambulatory BPs when checked in the pharmacy are normal. Recently seen with respiratory symptoms, status post amoxicillin, one of his main concerns today was postnasal dripping, he is on Astelin, symptoms have decreased but not completely. Occasional cough and hoarseness. No wheezing.  Past Medical History  Diagnosis Date  . CAD (coronary artery disease)     stable stress test 7/11  . CHF (congestive heart failure)   . Hypertension   . Hyperlipidemia   . ED (erectile dysfunction)   . Allergic rhinitis   . GERD (gastroesophageal reflux disease)   . AAA (abdominal aortic aneurysm)     repaired 02/2002, renal ultrasound, Sept 2010: normal abdominal aorta, normal renal arteries    Past Surgical History  Procedure Laterality Date  . Cholecystectomy    . Inguinal hernia repair    . Aaa repair  2003  . Carotid ultrasound  03-2010   negative   History   Social History  . Marital Status: Married    Spouse Name: N/A    Number of Children: 3  . Years of Education: N/A   Occupational History  . retired     Social History Main Topics  . Smoking status: Former Games developer  . Smokeless tobacco: Not on file  . Alcohol Use: No  . Drug Use: Not on file  . Sexually Active: Not on file   Other Topics Concern  . Not on file   Social History Narrative   WWII veteran ---   Diet: healthy --   Independent on his ADL    Family History  Problem Relation Age of Onset  . Lung cancer Father   . Prostate cancer Neg Hx   . Diabetes Neg Hx   . Colon cancer Neg Hx   . CAD      mother?     Review of Systems No chest pain or shortness of breath No lower extremity edema No nausea, vomiting, diarrhea or blood in the stools. No dysuria gross hematuria.     Objective:   Physical Exam  General -- alert, well-developed. Neck --no thyromegaly  Lungs -- normal respiratory effort, no intercostal retractions, no accessory muscle use, and slightly decreased breath sounds.   Heart-- normal rate, regular rhythm, no murmur, and no gallop.   Abdomen--soft, non-tender, no distention, no masses, no bruit.   Extremities-- no pretibial edema bilaterally  Neurologic-- alert & oriented X3 and strength normal in all extremities. Psych-- Cognition and  judgment appear intact. Alert and cooperative with normal attention span and concentration.  not anxious appearing and not depressed appearing.      Assessment & Plan:

## 2013-01-06 NOTE — Assessment & Plan Note (Signed)
Well-controlled, no change, labs 

## 2013-01-06 NOTE — Patient Instructions (Signed)
In addition to Astelin, start Flonase.   Fall Prevention and Home Safety Falls cause injuries and can affect all age groups. It is possible to use preventive measures to significantly decrease the likelihood of falls. There are many simple measures which can make your home safer and prevent falls. OUTDOORS  Repair cracks and edges of walkways and driveways.  Remove high doorway thresholds.  Trim shrubbery on the main path into your home.  Have good outside lighting.  Clear walkways of tools, rocks, debris, and clutter.  Check that handrails are not broken and are securely fastened. Both sides of steps should have handrails.  Have leaves, snow, and ice cleared regularly.  Use sand or salt on walkways during winter months.  In the garage, clean up grease or oil spills. BATHROOM  Install night lights.  Install grab bars by the toilet and in the tub and shower.  Use non-skid mats or decals in the tub or shower.  Place a plastic non-slip stool in the shower to sit on, if needed.  Keep floors dry and clean up all water on the floor immediately.  Remove soap buildup in the tub or shower on a regular basis.  Secure bath mats with non-slip, double-sided rug tape.  Remove throw rugs and tripping hazards from the floors. BEDROOMS  Install night lights.  Make sure a bedside light is easy to reach.  Do not use oversized bedding.  Keep a telephone by your bedside.  Have a firm chair with side arms to use for getting dressed.  Remove throw rugs and tripping hazards from the floor. KITCHEN  Keep handles on pots and pans turned toward the center of the stove. Use back burners when possible.  Clean up spills quickly and allow time for drying.  Avoid walking on wet floors.  Avoid hot utensils and knives.  Position shelves so they are not too high or low.  Place commonly used objects within easy reach.  If necessary, use a sturdy step stool with a grab bar when  reaching.  Keep electrical cables out of the way.  Do not use floor polish or wax that makes floors slippery. If you must use wax, use non-skid floor wax.  Remove throw rugs and tripping hazards from the floor. STAIRWAYS  Never leave objects on stairs.  Place handrails on both sides of stairways and use them. Fix any loose handrails. Make sure handrails on both sides of the stairways are as long as the stairs.  Check carpeting to make sure it is firmly attached along stairs. Make repairs to worn or loose carpet promptly.  Avoid placing throw rugs at the top or bottom of stairways, or properly secure the rug with carpet tape to prevent slippage. Get rid of throw rugs, if possible.  Have an electrician put in a light switch at the top and bottom of the stairs. OTHER FALL PREVENTION TIPS  Wear low-heel or rubber-soled shoes that are supportive and fit well. Wear closed toe shoes.  When using a stepladder, make sure it is fully opened and both spreaders are firmly locked. Do not climb a closed stepladder.  Add color or contrast paint or tape to grab bars and handrails in your home. Place contrasting color strips on first and last steps.  Learn and use mobility aids as needed. Install an electrical emergency response system.  Turn on lights to avoid dark areas. Replace light bulbs that burn out immediately. Get light switches that glow.  Arrange furniture to create  clear pathways. Keep furniture in the same place.  Firmly attach carpet with non-skid or double-sided tape.  Eliminate uneven floor surfaces.  Select a carpet pattern that does not visually hide the edge of steps.  Be aware of all pets. OTHER HOME SAFETY TIPS  Set the water temperature for 120 F (48.8 C).  Keep emergency numbers on or near the telephone.  Keep smoke detectors on every level of the home and near sleeping areas. Document Released: 10/26/2002 Document Revised: 05/06/2012 Document Reviewed:  01/25/2012 [facility] Patient Information 2013 [place], [place].

## 2013-01-06 NOTE — Assessment & Plan Note (Addendum)
Td 2013 pneumonia shot 2007 and today shingles shot , Rx provided last year, benefits discussed again  never Cscope  Previous PSAs normal Most medical societies recommend no further screening after age 77, he is asymptomatic. Patient in agreement, no further screening; knows to call  if symptoms such as blood in the urine, blood in the stools or difficulty urinating. Diet- exercise discussed.

## 2013-01-06 NOTE — Assessment & Plan Note (Addendum)
Good compliance with triple regime. Labs

## 2013-01-06 NOTE — Assessment & Plan Note (Signed)
Currently on Astelin, still having some postnasal dripping. Add Flonase

## 2013-01-07 ENCOUNTER — Encounter: Payer: Self-pay | Admitting: *Deleted

## 2013-01-07 DIAGNOSIS — Z961 Presence of intraocular lens: Secondary | ICD-10-CM | POA: Diagnosis not present

## 2013-01-07 DIAGNOSIS — H4011X0 Primary open-angle glaucoma, stage unspecified: Secondary | ICD-10-CM | POA: Diagnosis not present

## 2013-01-07 DIAGNOSIS — H3530 Unspecified macular degeneration: Secondary | ICD-10-CM | POA: Diagnosis not present

## 2013-03-25 ENCOUNTER — Other Ambulatory Visit: Payer: Self-pay | Admitting: Internal Medicine

## 2013-03-25 NOTE — Telephone Encounter (Signed)
Refill done.  

## 2013-04-22 ENCOUNTER — Other Ambulatory Visit: Payer: Self-pay | Admitting: Internal Medicine

## 2013-04-22 NOTE — Telephone Encounter (Signed)
Refill done.  

## 2013-07-06 ENCOUNTER — Telehealth: Payer: Self-pay | Admitting: *Deleted

## 2013-07-06 NOTE — Telephone Encounter (Signed)
Pharmacy is requesting a refill for Lotrisone cream.  However this prescription has not been refilled since 04/21/2012 is it ok to refill this at this time?  Please Advise   AG cma

## 2013-07-07 ENCOUNTER — Other Ambulatory Visit: Payer: Self-pay | Admitting: *Deleted

## 2013-07-07 MED ORDER — CLOTRIMAZOLE-BETAMETHASONE 1-0.05 % EX CREA: TOPICAL_CREAM | CUTANEOUS | Status: DC

## 2013-07-07 NOTE — Telephone Encounter (Signed)
Ok RF x 1 , will check those areas on RTC

## 2013-07-07 NOTE — Telephone Encounter (Signed)
Please call patient, clarify reason for the use Lotrisone

## 2013-07-07 NOTE — Telephone Encounter (Signed)
Rx was refilled per Dr. Drue Novel for Lotrisone cream.  Ag cma

## 2013-07-07 NOTE — Telephone Encounter (Signed)
I have spoken with patient and Christopher Hogan stated that Christopher Hogan has spot behind his ear and some on his body when Christopher Hogan had this problem some year ago Dr. Drue Novel prescribed this medicine and it worked well.  Please Advise   Ag cma

## 2013-07-09 ENCOUNTER — Ambulatory Visit (INDEPENDENT_AMBULATORY_CARE_PROVIDER_SITE_OTHER): Payer: Medicare Other | Admitting: Internal Medicine

## 2013-07-09 ENCOUNTER — Encounter: Payer: Self-pay | Admitting: Internal Medicine

## 2013-07-09 VITALS — BP 110/70 | HR 63 | Temp 98.1°F | Wt 160.6 lb

## 2013-07-09 DIAGNOSIS — I10 Essential (primary) hypertension: Secondary | ICD-10-CM | POA: Diagnosis not present

## 2013-07-09 DIAGNOSIS — L259 Unspecified contact dermatitis, unspecified cause: Secondary | ICD-10-CM | POA: Diagnosis not present

## 2013-07-09 DIAGNOSIS — L309 Dermatitis, unspecified: Secondary | ICD-10-CM

## 2013-07-09 DIAGNOSIS — E785 Hyperlipidemia, unspecified: Secondary | ICD-10-CM | POA: Diagnosis not present

## 2013-07-09 DIAGNOSIS — H4011X0 Primary open-angle glaucoma, stage unspecified: Secondary | ICD-10-CM | POA: Diagnosis not present

## 2013-07-09 DIAGNOSIS — H353190 Nonexudative age-related macular degeneration, unspecified eye, stage unspecified: Secondary | ICD-10-CM | POA: Diagnosis not present

## 2013-07-09 DIAGNOSIS — Z961 Presence of intraocular lens: Secondary | ICD-10-CM | POA: Diagnosis not present

## 2013-07-09 HISTORY — DX: Dermatitis, unspecified: L30.9

## 2013-07-09 LAB — POTASSIUM: Potassium: 4 mEq/L (ref 3.5–5.1)

## 2013-07-09 NOTE — Assessment & Plan Note (Signed)
Well controlled, recheck a K+

## 2013-07-09 NOTE — Progress Notes (Signed)
  Subjective:    Patient ID: Christopher Hogan, male    DOB: Nov 12, 1925, 79 y.o.   MRN: [id number]  HPI ROV High cholesterol, good medication compliance, no apparent side effects. Hypertension, good medication compliance, ambulatory BPs within normal. Uses Lotrisone prn for a rash behind the ears, likely eczema.  Past Medical History  Diagnosis Date  . CAD (coronary artery disease)     stable stress test 7/11  . CHF (congestive heart failure)   . Hypertension   . Hyperlipidemia   . ED (erectile dysfunction)   . Allergic rhinitis   . GERD (gastroesophageal reflux disease)   . AAA (abdominal aortic aneurysm)     repaired 02/2002, renal ultrasound, Sept 2010: normal abdominal aorta, normal renal arteries    Past Surgical History  Procedure Laterality Date  . Cholecystectomy    . Inguinal hernia repair    . Aaa repair  2003  . Carotid ultrasound  03-2010    negative     Review of Systems No chest pain or shortness or breath. He has macular degeneration and glaucoma, fortunately his vision is satisfactory, able to drive during the daytime.    Objective:   Physical Exam  General -- alert, well-developed, NAD.   Lungs -- normal respiratory effort, no intercostal retractions, no accessory muscle use, and normal breath sounds.  Heart-- normal rate, regular rhythm, no murmur.   Extremities-- no pretibial edema bilaterally  Neurologic-- alert & oriented X3. Speech, gait normal.  Psych--  not anxious appearing and not depressed appearing.      Assessment & Plan:

## 2013-07-09 NOTE — Assessment & Plan Note (Signed)
occ rash behind the ears, on lotrisone prn

## 2013-07-09 NOTE — Assessment & Plan Note (Addendum)
Last cholesterol was excellent taking simvastatin, lopid, Niaspan,fish oil; his [place] physician recommend to stop lopid however he is doing very well, good tolerance, I actually recommend no change.

## 2013-07-09 NOTE — Patient Instructions (Signed)
Your cholesterol is under excellent control with simvastatin, lopid, Niaspan,fish oil. I recommend you to continue with same medicines. Next visit in 6 months for a yearly checkup, please make an appointment.

## 2013-08-16 ENCOUNTER — Telehealth: Payer: Self-pay | Admitting: Internal Medicine

## 2013-08-16 ENCOUNTER — Encounter: Payer: Self-pay | Admitting: Internal Medicine

## 2013-08-16 NOTE — Telephone Encounter (Signed)
Patient got a letter from the [place] expressing concerns about taking niacin, fish oil, simvastatin and Lopid. Alsoabout taking Lasix and atenolol- chlorthalidone. They request recent copies of recent labs Plan: Please fax all labs from the last year to Dr. Lonell Face at the [facility] office I also wrote a letter to the patient, mail it and  fax a copy to Dr. Lonell Face as well.

## 2013-08-17 NOTE — Telephone Encounter (Signed)
Letter and labs sent. DJR

## 2013-08-19 ENCOUNTER — Other Ambulatory Visit: Payer: Self-pay | Admitting: Cardiology

## 2013-11-23 ENCOUNTER — Other Ambulatory Visit: Payer: Self-pay | Admitting: Internal Medicine

## 2013-11-26 ENCOUNTER — Other Ambulatory Visit: Payer: Self-pay

## 2013-11-26 MED ORDER — ATENOLOL 50 MG PO TABS: ORAL_TABLET | ORAL | Status: DC

## 2013-12-11 ENCOUNTER — Encounter: Payer: Self-pay | Admitting: *Deleted

## 2013-12-11 ENCOUNTER — Ambulatory Visit: Payer: Medicare Other | Admitting: Cardiology

## 2013-12-16 ENCOUNTER — Ambulatory Visit: Payer: Medicare Other | Admitting: Cardiology

## 2013-12-17 ENCOUNTER — Ambulatory Visit (INDEPENDENT_AMBULATORY_CARE_PROVIDER_SITE_OTHER): Payer: Medicare Other | Admitting: Cardiology

## 2013-12-17 ENCOUNTER — Encounter: Payer: Self-pay | Admitting: Cardiology

## 2013-12-17 VITALS — BP 120/70 | HR 57 | Ht 68.0 in | Wt 161.1 lb

## 2013-12-17 DIAGNOSIS — I509 Heart failure, unspecified: Secondary | ICD-10-CM

## 2013-12-17 DIAGNOSIS — I10 Essential (primary) hypertension: Secondary | ICD-10-CM | POA: Diagnosis not present

## 2013-12-17 DIAGNOSIS — I5022 Chronic systolic (congestive) heart failure: Secondary | ICD-10-CM

## 2013-12-17 HISTORY — DX: Chronic systolic (congestive) heart failure: I50.22

## 2013-12-17 LAB — COMPREHENSIVE METABOLIC PANEL
ALT: 11 U/L (ref 0–53)
AST: 15 U/L (ref 0–37)
Albumin: 3.9 g/dL (ref 3.5–5.2)
Alkaline Phosphatase: 49 U/L (ref 39–117)
BUN: 13 mg/dL (ref 6–23)
CO2: 24 mEq/L (ref 19–32)
Calcium: 8.7 mg/dL (ref 8.4–10.5)
Chloride: 104 mEq/L (ref 96–112)
Creatinine, Ser: 1 mg/dL (ref 0.4–1.5)
GFR: 74.4 mL/min (ref >60.00)
Glucose, Bld: 100 mg/dL — ABNORMAL HIGH (ref 70–99)
Potassium: 3.8 mEq/L (ref 3.5–5.1)
Sodium: 136 mEq/L (ref 135–145)
Total Bilirubin: 1.4 mg/dL — ABNORMAL HIGH (ref 0.3–1.2)
Total Protein: 6.8 g/dL (ref 6.0–8.3)

## 2013-12-17 MED ORDER — ATENOLOL 50 MG PO TABS: ORAL_TABLET | ORAL | Status: DC

## 2013-12-17 NOTE — Progress Notes (Signed)
Patient ID: Christopher Hogan, male   DOB: May 23, 1925, 80 y.o.   MRN: [id number]     Patient Name: Christopher Hogan Date of Encounter: 12/17/2013  Primary Care Provider:  [name], MD Primary Cardiologist:  Ena Dawley, H (prior Dr Verl Blalock)  Problem List   Past Medical History  Diagnosis Date  . CAD (coronary artery disease)     stable stress test 7/11  . CHF (congestive heart failure)   . Hypertension   . Hyperlipidemia   . ED (erectile dysfunction)   . Allergic rhinitis   . GERD (gastroesophageal reflux disease)   . AAA (abdominal aortic aneurysm)     repaired 02/2002, renal ultrasound, Sept 2010: normal abdominal aorta, normal renal arteries   . Myocardial infarction 1989    interior wall infarction   Past Surgical History  Procedure Laterality Date  . Cholecystectomy    . Inguinal hernia repair Right 1985  . Aaa repair  2003  . Carotid ultrasound  03-2010    negative  . Ptca  1989, 1992    Allergies  Allergies  Allergen Reactions  . Ace Inhibitors     REACTION: cough  . Aspirin     REACTION: nausea: GERD  . Spironolactone     REACTION: rash    HPI  Mr. Amicone comes in today for evaluation and management of his history of coronary artery disease, history of hypertension, and history of hyperlipidemia.  He's doing well with no complaints today. He specifically denies any angina, chest pain, palpitations, presyncope, orthopnea, PND or edema. He walks with his wife and doesn't feel limited in his activities.   Home Medications  Prior to Admission medications   Medication Sig Start Date End Date Taking? Authorizing Provider  amitriptyline (ELAVIL) 10 MG tablet Take 10 mg by mouth at bedtime as needed. 12/31/11  Yes Colon Branch, MD  amLODipine (NORVASC) 10 MG tablet Take 10 mg by mouth daily.     Yes Historical Provider, MD  aspirin 81 MG chewable tablet Chew 162 mg by mouth daily.    Yes Historical Provider, MD  atenolol (TENORMIN) 50 MG tablet TAKE ONE TABLET  BY MOUTH EVERY DAY 11/26/13  Yes Dorothy Spark, MD  clotrimazole-betamethasone (LOTRISONE) cream USE AS DIRECTED. IF RASH PERSISTS NEEDS APPOINTMENT 07/07/13  Yes Colon Branch, MD  Dorzolamide HCl-Timolol Mal PF 22.3-6.8 MG/ML SOLN Apply 1 drop to eye 2 (two) times daily.   Yes Historical Provider, MD  fish oil-omega-3 fatty acids 1000 MG capsule Take 1 g by mouth daily.    Yes Historical Provider, MD  furosemide (LASIX) 20 MG tablet TAKE ONE TABLET BY MOUTH ONCE DAILY 11/23/13  Yes Colon Branch, MD  gemfibrozil (LOPID) 600 MG tablet TAKE ONE TABLET BY MOUTH EVERY DAY 04/22/13  Yes Colon Branch, MD  latanoprost (XALATAN) 0.005 % ophthalmic solution Place 1 drop into both eyes at bedtime.   Yes Historical Provider, MD  loratadine (CLARITIN) 10 MG tablet Take 10 mg by mouth daily.   Yes Historical Provider, MD  Multiple Vitamin (MULTIVITAMIN) capsule Take 1 capsule by mouth daily.     Yes Historical Provider, MD  Multiple Vitamins-Minerals (OCUVITE PRESERVISION PO) Take by mouth.     Yes Historical Provider, MD  niacin (NIASPAN) 500 MG CR tablet Take 500 mg by mouth at bedtime.     Yes Historical Provider, MD  omeprazole (PRILOSEC) 20 MG capsule Take 20 mg by mouth daily.     Yes Historical  Provider, MD  potassium chloride SA (K-DUR,KLOR-CON) 20 MEQ tablet Take 20 mEq by mouth daily.     Yes Historical Provider, MD  simvastatin (ZOCOR) 10 MG tablet Take 1 tablet (10 mg total) by mouth at bedtime. 05/14/12  Yes Colon Branch, MD    Family History  Family History  Problem Relation Age of Onset  . Lung cancer Father   . Prostate cancer Neg Hx   . Diabetes Neg Hx   . Colon cancer Neg Hx   . CAD      mother?    Social History  History   Social History  . Marital Status: Married    Spouse Name: N/A    Number of Children: 3  . Years of Education: N/A   Occupational History  . retired     Social History Main Topics  . Smoking status: Former Research scientist (life sciences)  . Smokeless tobacco: Not on file     Comment:  quit in 1953  . Alcohol Use: No  . Drug Use: Not on file  . Sexual Activity: Not on file   Other Topics Concern  . Not on file   Social History Narrative   WWII veteran ---   Diet: healthy --   Independent on his ADL      Review of Systems, as per HPI, otherwise negative General:  No chills, fever, night sweats or weight changes.  Cardiovascular:  No chest pain, dyspnea on exertion, edema, orthopnea, palpitations, paroxysmal nocturnal dyspnea. Dermatological: No rash, lesions/masses Respiratory: No cough, dyspnea Urologic: No hematuria, dysuria Abdominal:   No nausea, vomiting, diarrhea, bright red blood per rectum, melena, or hematemesis Neurologic:  No visual changes, wkns, changes in mental status. All other systems reviewed and are otherwise negative except as noted above.  Physical Exam  Blood pressure 120/70, pulse 57, height 5\' 8"  (1.727 m), weight 161 lb 1.9 oz (73.084 kg).  General: Pleasant, NAD Psych: Normal affect. Neuro: Alert and oriented X 3. Moves all extremities spontaneously. HEENT: Normal  Neck: Supple without bruits or JVD. Lungs:  Resp regular and unlabored, CTA. Heart: RRR no s3, s4, or murmurs. Abdomen: Soft, non-tender, non-distended, BS + x 4.  Extremities: No clubbing, cyanosis or edema. DP/PT/Radials 2+ and equal bilaterally.  Labs:  No results found for this basename: CKTOTAL, CKMB, TROPONINI,  in the last 72 hours Lab Results  Component Value Date   WBC 6.3 01/06/2013   HGB 13.4 01/06/2013   HCT 39.9 01/06/2013   MCV 94.4 01/06/2013   PLT 200.0 01/06/2013   No results found for this basename: NA, K, CL, CO2, BUN, CREATININE, CALCIUM, LABALBU, PROT, BILITOT, ALKPHOS, ALT, AST, GLUCOSE,  in the last 168 hours Lab Results  Component Value Date   CHOL 112 01/06/2013   HDL 35.20* 01/06/2013   LDLCALC 50 01/06/2013   TRIG 133.0 01/06/2013    Accessory Clinical Findings  Echocardiogram - none in epic  ECG - sinus bradycardia, prior inferior  infarct age undetermined, abnormal EKG, unchanged from prior in 2013.  Assessment & Plan  A very pleasant 80 year old male formrly followed by Dr Verl Blalock  1. CAD, MI in 1989, negative stress test in 2011, the patient is asymptomatic, no reason for further testing  2. Hypertension - controlled  3. Hyperlipidemia - at goal  4. Chronic systolic  CHF - based on exercise stress echo in 2008, the patient had inferior scar and LVEF 48%. He is well compensated on 20 mg of Lasix and 20  mEq of KCl, we will recheck CMP today.  Follow up in 1 year.   Ena Dawley, Lemmie Evens, MD, [facility] 12/17/2013, 11:12 AM

## 2013-12-17 NOTE — Patient Instructions (Signed)
**Note De-identified Giovoni Bunch Obfuscation** Your physician recommends that you continue on your current medications as directed. Please refer to the Current Medication list given to you today.  Your physician recommends that you return for lab work in: today  Your physician wants you to follow-up in: 1 year. You will receive a reminder letter in the mail two months in advance. If you don't receive a letter, please call our office to schedule the follow-up appointment.    

## 2014-01-04 ENCOUNTER — Ambulatory Visit (INDEPENDENT_AMBULATORY_CARE_PROVIDER_SITE_OTHER): Payer: Medicare Other | Admitting: Ophthalmology

## 2014-01-04 DIAGNOSIS — H43819 Vitreous degeneration, unspecified eye: Secondary | ICD-10-CM | POA: Diagnosis not present

## 2014-01-04 DIAGNOSIS — H3530 Unspecified macular degeneration: Secondary | ICD-10-CM

## 2014-01-04 DIAGNOSIS — I10 Essential (primary) hypertension: Secondary | ICD-10-CM | POA: Diagnosis not present

## 2014-01-04 DIAGNOSIS — H35039 Hypertensive retinopathy, unspecified eye: Secondary | ICD-10-CM | POA: Diagnosis not present

## 2014-01-14 ENCOUNTER — Ambulatory Visit: Payer: Medicare Other | Admitting: Internal Medicine

## 2014-01-22 ENCOUNTER — Ambulatory Visit (INDEPENDENT_AMBULATORY_CARE_PROVIDER_SITE_OTHER): Payer: Medicare Other | Admitting: Internal Medicine

## 2014-01-22 ENCOUNTER — Encounter: Payer: Self-pay | Admitting: Internal Medicine

## 2014-01-22 VITALS — BP 104/64 | HR 68 | Temp 98.2°F | Wt 162.0 lb

## 2014-01-22 DIAGNOSIS — R945 Abnormal results of liver function studies: Secondary | ICD-10-CM

## 2014-01-22 DIAGNOSIS — E785 Hyperlipidemia, unspecified: Secondary | ICD-10-CM | POA: Diagnosis not present

## 2014-01-22 DIAGNOSIS — R7989 Other specified abnormal findings of blood chemistry: Secondary | ICD-10-CM | POA: Diagnosis not present

## 2014-01-22 DIAGNOSIS — J309 Allergic rhinitis, unspecified: Secondary | ICD-10-CM

## 2014-01-22 DIAGNOSIS — I10 Essential (primary) hypertension: Secondary | ICD-10-CM | POA: Diagnosis not present

## 2014-01-22 LAB — CBC WITH DIFFERENTIAL/PLATELET
Basophils Absolute: 0 10*3/uL (ref 0.0–0.1)
Basophils Relative: 0.6 % (ref 0.0–3.0)
Eosinophils Absolute: 0.2 10*3/uL (ref 0.0–0.7)
Eosinophils Relative: 3.7 % (ref 0.0–5.0)
HCT: 40.5 % (ref 39.0–52.0)
Hemoglobin: 13.7 g/dL (ref 13.0–17.0)
Lymphocytes Relative: 19.7 % (ref 12.0–46.0)
Lymphs Abs: 1.3 10*3/uL (ref 0.7–4.0)
MCHC: 33.8 g/dL (ref 30.0–36.0)
MCV: 95.6 fl (ref 78.0–100.0)
Monocytes Absolute: 0.7 10*3/uL (ref 0.1–1.0)
Monocytes Relative: 11 % (ref 3.0–12.0)
Neutro Abs: 4.3 10*3/uL (ref 1.4–7.7)
Neutrophils Relative %: 65 % (ref 43.0–77.0)
Platelets: 174 10*3/uL (ref 150.0–400.0)
RBC: 4.24 Mil/uL (ref 4.22–5.81)
RDW: 14.8 % — ABNORMAL HIGH (ref 11.5–14.6)
WBC: 6.6 10*3/uL (ref 4.5–10.5)

## 2014-01-22 LAB — LIPID PANEL
Cholesterol: 114 mg/dL (ref 0–200)
HDL: 39.9 mg/dL (ref >39.00)
LDL Cholesterol: 54 mg/dL (ref 0–99)
Total CHOL/HDL Ratio: 3
Triglycerides: 100 mg/dL (ref 0.0–149.0)
VLDL: 20 mg/dL (ref 0.0–40.0)

## 2014-01-22 MED ORDER — IPRATROPIUM BROMIDE 0.03 % NA SOLN: 2.0000 | Freq: Three times a day (TID) | NASAL | Status: DC

## 2014-01-22 NOTE — Progress Notes (Signed)
Subjective:    Patient ID: Christopher Hogan, male    DOB: 08-17-1925, 80 y.o.   MRN: [id number]  DOS:  01/22/2014 Type of  visit: ROV. The following issues were discussed   Hypertension--good medication compliance, BP today slightly low, she feels well. BP with cardiology normal Rhinitis-- continue with postnasal dripping, blowing his nose constantly, Symptoms worse when he eats. High cholesterol--good compliance with medications, [facility], wonders if he "really needs it". Bilirubin slightly elevated recently, see assessment and plan.   ROS No fever chills. No chest congestion or wheezing. No itchy eyes, nose or headaches. Appetite is normal with good by mouth tolerance. No nausea, vomiting, diarrhea or blood in the stools.  Past Medical History  Diagnosis Date  . CAD (coronary artery disease)     stable stress test 7/11  . CHF (congestive heart failure)   . Hypertension   . Hyperlipidemia   . ED (erectile dysfunction)   . Allergic rhinitis   . GERD (gastroesophageal reflux disease)   . AAA (abdominal aortic aneurysm)     repaired 02/2002, renal ultrasound, Sept 2010: normal abdominal aorta, normal renal arteries   . Myocardial infarction 1989    interior wall infarction    Past Surgical History  Procedure Laterality Date  . Cholecystectomy    . Inguinal hernia repair Right 1985  . Aaa repair  2003  . Carotid ultrasound  03-2010    negative  . Ptca  1989, 1992    History   Social History  . Marital Status: Married    Spouse Name: N/A    Number of Children: 3  . Years of Education: N/A   Occupational History  . retired     Social History Main Topics  . Smoking status: Former Research scientist (life sciences)  . Smokeless tobacco: Not on file     Comment: quit in 1953  . Alcohol Use: No  . Drug Use: Not on file  . Sexual Activity: Not on file   Other Topics Concern  . Not on file   Social History Narrative   WWII veteran   Independent on his ADL           Medication List       This list is accurate as of: 01/22/14 11:59 PM.  Always use your most recent med list.               amitriptyline 10 MG tablet  Commonly known as:  ELAVIL  Take 10 mg by mouth at bedtime as needed.     amLODipine 10 MG tablet  Commonly known as:  NORVASC  Take 10 mg by mouth daily.     aspirin 81 MG chewable tablet  Chew 162 mg by mouth daily.     atenolol 50 MG tablet  Commonly known as:  TENORMIN  TAKE ONE TABLET BY MOUTH EVERY DAY     CLARITIN 10 MG tablet  Generic drug:  loratadine  Take 10 mg by mouth daily.     clotrimazole-betamethasone cream  Commonly known as:  LOTRISONE  USE AS DIRECTED. IF RASH PERSISTS NEEDS APPOINTMENT     Dorzolamide HCl-Timolol Mal PF 22.3-6.8 MG/ML Soln  Apply 1 drop to eye 2 (two) times daily.     fish oil-omega-3 fatty acids 1000 MG capsule  Take 1 g by mouth daily.     furosemide 20 MG tablet  Commonly known as:  LASIX  TAKE ONE TABLET BY MOUTH ONCE DAILY  gemfibrozil 600 MG tablet  Commonly known as:  LOPID  TAKE ONE TABLET BY MOUTH EVERY DAY     ipratropium 0.03 % nasal spray  Commonly known as:  ATROVENT  Place 2 sprays into both nostrils 3 (three) times daily.     latanoprost 0.005 % ophthalmic solution  Commonly known as:  XALATAN  Place 1 drop into both eyes at bedtime.     multivitamin capsule  Take 1 capsule by mouth daily.     niacin 500 MG CR tablet  Commonly known as:  NIASPAN  Take 500 mg by mouth at bedtime.     OCUVITE PRESERVISION PO  Take by mouth.     omeprazole 20 MG capsule  Commonly known as:  PRILOSEC  Take 20 mg by mouth daily.     potassium chloride SA 20 MEQ tablet  Commonly known as:  K-DUR,KLOR-CON  Take 20 mEq by mouth daily.     simvastatin 10 MG tablet  Commonly known as:  ZOCOR  Take 1 tablet (10 mg total) by mouth at bedtime.           Objective:   Physical Exam BP 104/64  Pulse 68  Temp(Src) 98.2 F (36.8 C)  Wt 162 lb (73.483  kg)  SpO2 97% General -- alert, well-developed, NAD.  HEENT-- Not pale. Face symmetric, sinuses not tender to palpation. Nose slt  congested. Lungs -- normal respiratory effort, no intercostal retractions, no accessory muscle use, and normal breath sounds.  Heart-- normal rate, regular rhythm, no murmur.  Abdomen-- Not distended, good bowel sounds,soft, non-tender.  Extremities-- no pretibial edema bilaterally  Neurologic--  alert & oriented X3. Speech normal, gait normal, strength normal in all extremities.  Psych-- Cognition and judgment appear intact. Cooperative with normal attention span and concentration. No anxious or depressed appearing.     Assessment & Plan:   Mild increase in bilirubin, GI review of systems negative, abdomen exam  benign. Recommend monitoring from time to time

## 2014-01-22 NOTE — Assessment & Plan Note (Signed)
BP slightly low today but @  cardiology was normal. Recent BMP normal Plan: No change, monitor BPs

## 2014-01-22 NOTE — Assessment & Plan Note (Addendum)
Self d/c  Astelin and Flonase b/c they didn't help. He still have symptoms, most likely vasomotor rhinitis. Plan rx ipratropium. If not better and symptoms are bothersome will consider ENT referral ; patient aware

## 2014-01-22 NOTE — Assessment & Plan Note (Signed)
Recent LFTs normal except for slightly increased bilirubin. Check FLP. Patient wonders if he needs gemfibrozil. I do recommend him to continue with that

## 2014-01-22 NOTE — Patient Instructions (Signed)
Get your blood work before you leave   Next visit is for a routine check up in 6 months , fasting Please make an appointment    Start the new nasal spray, ipratropium 3 times a day, if that is not working please let me know.

## 2014-01-23 ENCOUNTER — Telehealth: Payer: Self-pay | Admitting: Internal Medicine

## 2014-01-23 NOTE — Telephone Encounter (Signed)
Relevant patient education mailed to patient.  

## 2014-01-25 ENCOUNTER — Telehealth: Payer: Self-pay | Admitting: Internal Medicine

## 2014-01-25 NOTE — Telephone Encounter (Signed)
Relevant patient education mailed to patient.  

## 2014-01-26 ENCOUNTER — Encounter: Payer: Self-pay | Admitting: *Deleted

## 2014-01-28 DIAGNOSIS — Z961 Presence of intraocular lens: Secondary | ICD-10-CM | POA: Diagnosis not present

## 2014-01-28 DIAGNOSIS — H4011X0 Primary open-angle glaucoma, stage unspecified: Secondary | ICD-10-CM | POA: Diagnosis not present

## 2014-01-28 DIAGNOSIS — H353190 Nonexudative age-related macular degeneration, unspecified eye, stage unspecified: Secondary | ICD-10-CM | POA: Diagnosis not present

## 2014-03-04 ENCOUNTER — Telehealth: Payer: Self-pay | Admitting: Internal Medicine

## 2014-03-04 NOTE — Telephone Encounter (Signed)
Received a letter from the patient stating that his [place] doctor recommended to stop gemfibrozil, fish oil and niacin for 6-8 weeks and then recheck FLP to see how he is doing with simvastatin only. I agree, I think is a good idea, simvastatin may be the only medication he needs. Please let the patient know my opinion

## 2014-03-05 NOTE — Telephone Encounter (Signed)
Pt verbalized understanding. Scheduled for lab in 7 wks.

## 2014-04-22 ENCOUNTER — Other Ambulatory Visit (INDEPENDENT_AMBULATORY_CARE_PROVIDER_SITE_OTHER): Payer: Medicare Other

## 2014-04-22 DIAGNOSIS — I10 Essential (primary) hypertension: Secondary | ICD-10-CM | POA: Diagnosis not present

## 2014-04-22 LAB — LIPID PANEL
Cholesterol: 114 mg/dL (ref 0–200)
HDL: 32.4 mg/dL — ABNORMAL LOW (ref >39.00)
LDL Cholesterol: 34 mg/dL (ref 0–99)
NonHDL: 81.6
Total CHOL/HDL Ratio: 4
Triglycerides: 239 mg/dL — ABNORMAL HIGH (ref 0.0–149.0)
VLDL: 47.8 mg/dL — ABNORMAL HIGH (ref 0.0–40.0)

## 2014-05-04 ENCOUNTER — Telehealth: Payer: Self-pay | Admitting: Internal Medicine

## 2014-05-04 NOTE — Telephone Encounter (Signed)
Caller name: Thelma Relation to WS:[id number] person Call back number:[phone]   Reason for call:  Pt and [name] would like to speak with someone regarding lab tests.

## 2014-05-04 NOTE — Telephone Encounter (Signed)
Copy of lab results mailed to patient per his request.

## 2014-07-23 ENCOUNTER — Telehealth: Payer: Self-pay | Admitting: Internal Medicine

## 2014-07-23 DIAGNOSIS — E785 Hyperlipidemia, unspecified: Secondary | ICD-10-CM

## 2014-07-23 NOTE — Telephone Encounter (Signed)
Caller name: Sebastien Relation to pt: Call back [phone]   Reason for call:  Wants to come in for 3 month lab work; cholesterol.  Can we get orders placed?

## 2014-07-23 NOTE — Telephone Encounter (Signed)
Lipid panel ordered, please instruct Pt he MUST be fasting for this blood work.   Thanks!

## 2014-07-23 NOTE — Telephone Encounter (Signed)
Pt scheduled and informed pt that he must be fasting.

## 2014-07-29 ENCOUNTER — Other Ambulatory Visit (INDEPENDENT_AMBULATORY_CARE_PROVIDER_SITE_OTHER): Payer: Medicare Other

## 2014-07-29 DIAGNOSIS — R7989 Other specified abnormal findings of blood chemistry: Secondary | ICD-10-CM | POA: Diagnosis not present

## 2014-07-29 DIAGNOSIS — E785 Hyperlipidemia, unspecified: Secondary | ICD-10-CM

## 2014-07-29 LAB — LIPID PANEL
Cholesterol: 114 mg/dL (ref 0–200)
HDL: 25.8 mg/dL — ABNORMAL LOW (ref >39.00)
NonHDL: 88.2
Total CHOL/HDL Ratio: 4
Triglycerides: 224 mg/dL — ABNORMAL HIGH (ref 0.0–149.0)
VLDL: 44.8 mg/dL — ABNORMAL HIGH (ref 0.0–40.0)

## 2014-07-29 LAB — LDL CHOLESTEROL, DIRECT: Direct LDL: 57.3 mg/dL

## 2014-08-05 DIAGNOSIS — H4011X0 Primary open-angle glaucoma, stage unspecified: Secondary | ICD-10-CM | POA: Diagnosis not present

## 2014-08-05 DIAGNOSIS — Z961 Presence of intraocular lens: Secondary | ICD-10-CM | POA: Diagnosis not present

## 2014-08-05 DIAGNOSIS — H04129 Dry eye syndrome of unspecified lacrimal gland: Secondary | ICD-10-CM | POA: Diagnosis not present

## 2014-08-05 DIAGNOSIS — H353190 Nonexudative age-related macular degeneration, unspecified eye, stage unspecified: Secondary | ICD-10-CM | POA: Diagnosis not present

## 2014-08-23 ENCOUNTER — Telehealth: Payer: Self-pay | Admitting: *Deleted

## 2014-08-23 NOTE — Telephone Encounter (Signed)
Lab results printed and mailed to Pt.  

## 2014-08-23 NOTE — Telephone Encounter (Signed)
Pt request lab results from 07/29/2014 be mailed to him.

## 2014-12-22 ENCOUNTER — Ambulatory Visit (INDEPENDENT_AMBULATORY_CARE_PROVIDER_SITE_OTHER): Payer: Medicare Other | Admitting: Cardiology

## 2014-12-22 ENCOUNTER — Encounter: Payer: Self-pay | Admitting: Cardiology

## 2014-12-22 VITALS — BP 128/64 | HR 56 | Ht 68.0 in | Wt 160.0 lb

## 2014-12-22 DIAGNOSIS — I2510 Atherosclerotic heart disease of native coronary artery without angina pectoris: Secondary | ICD-10-CM

## 2014-12-22 DIAGNOSIS — I10 Essential (primary) hypertension: Secondary | ICD-10-CM | POA: Diagnosis not present

## 2014-12-22 DIAGNOSIS — E785 Hyperlipidemia, unspecified: Secondary | ICD-10-CM | POA: Diagnosis not present

## 2014-12-22 DIAGNOSIS — I5022 Chronic systolic (congestive) heart failure: Secondary | ICD-10-CM

## 2014-12-22 NOTE — Progress Notes (Signed)
Patient ID: TAYSON SCHNELLE, male   DOB: Nov 26, 1924, 81 y.o.   MRN: [id number]     Patient Name: Christopher Hogan Date of Encounter: 12/22/2014  Primary Care Provider:  [name], MD Primary Cardiologist:  Dorothy Spark (prior Dr Verl Blalock)  Problem List   Past Medical History  Diagnosis Date  . CAD (coronary artery disease)     stable stress test 7/11  . CHF (congestive heart failure)   . Hypertension   . Hyperlipidemia   . ED (erectile dysfunction)   . Allergic rhinitis   . GERD (gastroesophageal reflux disease)   . AAA (abdominal aortic aneurysm)     repaired 02/2002, renal ultrasound, Sept 2010: normal abdominal aorta, normal renal arteries   . Myocardial infarction 1989    interior wall infarction   Past Surgical History  Procedure Laterality Date  . Cholecystectomy    . Inguinal hernia repair Right 1985  . Aaa repair  2003  . Carotid ultrasound  03-2010    negative  . Ptca  1989, 1992    Allergies  Allergies  Allergen Reactions  . Ace Inhibitors     REACTION: cough  . Aspirin     REACTION: nausea: GERD  . Spironolactone     REACTION: rash    HPI  Mr. Antony comes in today for evaluation and management of his history of coronary artery disease, history of hypertension, and history of hyperlipidemia.  He's doing well with no complaints today. He specifically denies any angina, chest pain, palpitations, presyncope, orthopnea, PND or edema. He walks with his wife and doesn't feel limited in his activities.   Mr. Silversmith is coming after one year, he denies any chest pain or shortness of breath but states that he is not very active other than walking in the park. The patient is compliant with his medicines and doesn't complain of any side effects. He denies lower extremity edema palpitations, orthopnea paroxysmal nocturnal dyspnea or syncope. His only complain is 6 months of coughing with significant postnasal drainage. His cough is productive of just whitish sputum.  He saw his primary care physician gave him on nasal spray that didn't help. He is going to see a specialist soon.  Home Medications  Prior to Admission medications   Medication Sig Start Date End Date Taking? Authorizing Provider  amitriptyline (ELAVIL) 10 MG tablet Take 10 mg by mouth at bedtime as needed. 12/31/11  Yes Colon Branch, MD  amLODipine (NORVASC) 10 MG tablet Take 10 mg by mouth daily.     Yes Historical Provider, MD  aspirin 81 MG chewable tablet Chew 162 mg by mouth daily.    Yes Historical Provider, MD  atenolol (TENORMIN) 50 MG tablet TAKE ONE TABLET BY MOUTH EVERY DAY 11/26/13  Yes Dorothy Spark, MD  clotrimazole-betamethasone (LOTRISONE) cream USE AS DIRECTED. IF RASH PERSISTS NEEDS APPOINTMENT 07/07/13  Yes Colon Branch, MD  Dorzolamide HCl-Timolol Mal PF 22.3-6.8 MG/ML SOLN Apply 1 drop to eye 2 (two) times daily.   Yes Historical Provider, MD  fish oil-omega-3 fatty acids 1000 MG capsule Take 1 g by mouth daily.    Yes Historical Provider, MD  furosemide (LASIX) 20 MG tablet TAKE ONE TABLET BY MOUTH ONCE DAILY 11/23/13  Yes Colon Branch, MD  gemfibrozil (LOPID) 600 MG tablet TAKE ONE TABLET BY MOUTH EVERY DAY 04/22/13  Yes Colon Branch, MD  latanoprost (XALATAN) 0.005 % ophthalmic solution Place 1 drop into both eyes at bedtime.  Yes Historical Provider, MD  loratadine (CLARITIN) 10 MG tablet Take 10 mg by mouth daily.   Yes Historical Provider, MD  Multiple Vitamin (MULTIVITAMIN) capsule Take 1 capsule by mouth daily.     Yes Historical Provider, MD  Multiple Vitamins-Minerals (OCUVITE PRESERVISION PO) Take by mouth.     Yes Historical Provider, MD  niacin (NIASPAN) 500 MG CR tablet Take 500 mg by mouth at bedtime.     Yes Historical Provider, MD  omeprazole (PRILOSEC) 20 MG capsule Take 20 mg by mouth daily.     Yes Historical Provider, MD  potassium chloride SA (K-DUR,KLOR-CON) 20 MEQ tablet Take 20 mEq by mouth daily.     Yes Historical Provider, MD  simvastatin (ZOCOR) 10 MG  tablet Take 1 tablet (10 mg total) by mouth at bedtime. 05/14/12  Yes Colon Branch, MD    Family History  Family History  Problem Relation Age of Onset  . Lung cancer Father   . Prostate cancer Neg Hx   . Diabetes Neg Hx   . Colon cancer Neg Hx   . CAD      mother?    Social History  History   Social History  . Marital Status: Married    Spouse Name: N/A    Number of Children: 3  . Years of Education: N/A   Occupational History  . retired     Social History Main Topics  . Smoking status: Former Research scientist (life sciences)  . Smokeless tobacco: Not on file     Comment: quit in 1953  . Alcohol Use: No  . Drug Use: Not on file  . Sexual Activity: Not on file   Other Topics Concern  . Not on file   Social History Narrative   WWII veteran   Independent on his ADL       Review of Systems, as per HPI, otherwise negative General:  No chills, fever, night sweats or weight changes.  Cardiovascular:  No chest pain, dyspnea on exertion, edema, orthopnea, palpitations, paroxysmal nocturnal dyspnea. Dermatological: No rash, lesions/masses Respiratory: No cough, dyspnea Urologic: No hematuria, dysuria Abdominal:   No nausea, vomiting, diarrhea, bright red blood per rectum, melena, or hematemesis Neurologic:  No visual changes, wkns, changes in mental status. All other systems reviewed and are otherwise negative except as noted above.  Physical Exam  Blood pressure 128/64, pulse 56, height 5\' 8"  (1.727 m), weight 160 lb (72.576 kg).  General: Pleasant, NAD Psych: Normal affect. Neuro: Alert and oriented X 3. Moves all extremities spontaneously. HEENT: Normal  Neck: Supple without bruits or JVD. Lungs:  Resp regular and unlabored, CTA. Heart: RRR no s3, s4, or murmurs. Abdomen: Soft, non-tender, non-distended, BS + x 4.  Extremities: No clubbing, cyanosis or edema. DP/PT/Radials 2+ and equal bilaterally.  Labs:  No results for input(s): CKTOTAL, CKMB, TROPONINI in the last 72 hours. Lab  Results  Component Value Date   WBC 6.6 01/22/2014   HGB 13.7 01/22/2014   HCT 40.5 01/22/2014   MCV 95.6 01/22/2014   PLT 174.0 01/22/2014   No results for input(s): NA, K, CL, CO2, BUN, CREATININE, CALCIUM, PROT, BILITOT, ALKPHOS, ALT, AST, GLUCOSE in the last 168 hours.  Invalid input(s): LABALBU Lab Results  Component Value Date   CHOL 114 07/29/2014   HDL 25.80* 07/29/2014   LDLCALC 34 04/22/2014   TRIG 224.0* 07/29/2014    Accessory Clinical Findings  Echocardiogram - none in epic  ECG - sinus bradycardia, prior inferior infarct age undetermined,  abnormal EKG, unchanged from prior in 2013.  Assessment & Plan  A very pleasant 81 year old male formrly followed by Dr Verl Blalock  1. CAD, MI in 1989, negative stress test in 2011, the patient is asymptomatic, no reason for further testing  2. Hypertension - controlled  3. Hyperlipidemia - at goal LDL and HDL, mildly elevated TAG on gemfibrozil  4. Chronic systolic  CHF - based on exercise stress echo in 2008, the patient had inferior scar and LVEF 48%. He is well compensated on 20 mg of Lasix and 20 mEq of KCl, normal labs in January 2015.  Follow up in 1 year.   Dorothy Spark, MD, [facility] 12/22/2014, 9:55 AM

## 2014-12-22 NOTE — Patient Instructions (Signed)
Your physician recommends that you continue on your current medications as directed. Please refer to the Current Medication list given to you today.   Your physician wants you to follow-up in: ONE YEAR WITH DR NELSON You will receive a reminder letter in the mail two months in advance. If you don't receive a letter, please call our office to schedule the follow-up appointment.  

## 2015-01-05 ENCOUNTER — Ambulatory Visit (INDEPENDENT_AMBULATORY_CARE_PROVIDER_SITE_OTHER): Payer: Medicare Other | Admitting: Ophthalmology

## 2015-01-05 DIAGNOSIS — H43813 Vitreous degeneration, bilateral: Secondary | ICD-10-CM | POA: Diagnosis not present

## 2015-01-05 DIAGNOSIS — I10 Essential (primary) hypertension: Secondary | ICD-10-CM | POA: Diagnosis not present

## 2015-01-05 DIAGNOSIS — H3531 Nonexudative age-related macular degeneration: Secondary | ICD-10-CM | POA: Diagnosis not present

## 2015-01-05 DIAGNOSIS — H3532 Exudative age-related macular degeneration: Secondary | ICD-10-CM

## 2015-01-05 DIAGNOSIS — H35033 Hypertensive retinopathy, bilateral: Secondary | ICD-10-CM | POA: Diagnosis not present

## 2015-01-07 ENCOUNTER — Ambulatory Visit (INDEPENDENT_AMBULATORY_CARE_PROVIDER_SITE_OTHER): Payer: Medicare Other | Admitting: *Deleted

## 2015-01-07 DIAGNOSIS — Z23 Encounter for immunization: Secondary | ICD-10-CM

## 2015-01-07 NOTE — Progress Notes (Signed)
Pre visit review using our clinic review tool, if applicable. No additional management support is needed unless otherwise documented below in the visit note.  Patient tolerated injection well.  

## 2015-01-13 ENCOUNTER — Encounter (INDEPENDENT_AMBULATORY_CARE_PROVIDER_SITE_OTHER): Payer: Medicare Other | Admitting: Ophthalmology

## 2015-01-13 DIAGNOSIS — H3532 Exudative age-related macular degeneration: Secondary | ICD-10-CM

## 2015-02-03 DIAGNOSIS — H3531 Nonexudative age-related macular degeneration: Secondary | ICD-10-CM | POA: Diagnosis not present

## 2015-02-03 DIAGNOSIS — Z961 Presence of intraocular lens: Secondary | ICD-10-CM | POA: Diagnosis not present

## 2015-02-03 DIAGNOSIS — H3532 Exudative age-related macular degeneration: Secondary | ICD-10-CM | POA: Diagnosis not present

## 2015-02-03 DIAGNOSIS — H02831 Dermatochalasis of right upper eyelid: Secondary | ICD-10-CM | POA: Diagnosis not present

## 2015-02-03 DIAGNOSIS — H02834 Dermatochalasis of left upper eyelid: Secondary | ICD-10-CM | POA: Diagnosis not present

## 2015-02-03 DIAGNOSIS — H4011X1 Primary open-angle glaucoma, mild stage: Secondary | ICD-10-CM | POA: Diagnosis not present

## 2015-02-03 DIAGNOSIS — H4011X3 Primary open-angle glaucoma, severe stage: Secondary | ICD-10-CM | POA: Diagnosis not present

## 2015-02-10 ENCOUNTER — Encounter (INDEPENDENT_AMBULATORY_CARE_PROVIDER_SITE_OTHER): Payer: Medicare Other | Admitting: Ophthalmology

## 2015-02-10 DIAGNOSIS — H3531 Nonexudative age-related macular degeneration: Secondary | ICD-10-CM | POA: Diagnosis not present

## 2015-02-10 DIAGNOSIS — H35033 Hypertensive retinopathy, bilateral: Secondary | ICD-10-CM | POA: Diagnosis not present

## 2015-02-10 DIAGNOSIS — H3532 Exudative age-related macular degeneration: Secondary | ICD-10-CM | POA: Diagnosis not present

## 2015-02-10 DIAGNOSIS — H43813 Vitreous degeneration, bilateral: Secondary | ICD-10-CM

## 2015-02-10 DIAGNOSIS — I10 Essential (primary) hypertension: Secondary | ICD-10-CM | POA: Diagnosis not present

## 2015-03-10 ENCOUNTER — Encounter (INDEPENDENT_AMBULATORY_CARE_PROVIDER_SITE_OTHER): Payer: Medicare Other | Admitting: Ophthalmology

## 2015-03-10 DIAGNOSIS — H3532 Exudative age-related macular degeneration: Secondary | ICD-10-CM

## 2015-03-10 DIAGNOSIS — H35033 Hypertensive retinopathy, bilateral: Secondary | ICD-10-CM

## 2015-03-10 DIAGNOSIS — H3531 Nonexudative age-related macular degeneration: Secondary | ICD-10-CM | POA: Diagnosis not present

## 2015-03-10 DIAGNOSIS — I10 Essential (primary) hypertension: Secondary | ICD-10-CM

## 2015-03-10 DIAGNOSIS — H43813 Vitreous degeneration, bilateral: Secondary | ICD-10-CM | POA: Diagnosis not present

## 2015-04-08 ENCOUNTER — Encounter (INDEPENDENT_AMBULATORY_CARE_PROVIDER_SITE_OTHER): Payer: Medicare Other | Admitting: Ophthalmology

## 2015-04-08 DIAGNOSIS — H3531 Nonexudative age-related macular degeneration: Secondary | ICD-10-CM

## 2015-04-08 DIAGNOSIS — I10 Essential (primary) hypertension: Secondary | ICD-10-CM

## 2015-04-08 DIAGNOSIS — H43813 Vitreous degeneration, bilateral: Secondary | ICD-10-CM

## 2015-04-08 DIAGNOSIS — H3532 Exudative age-related macular degeneration: Secondary | ICD-10-CM

## 2015-04-08 DIAGNOSIS — H35033 Hypertensive retinopathy, bilateral: Secondary | ICD-10-CM

## 2015-05-05 ENCOUNTER — Encounter (INDEPENDENT_AMBULATORY_CARE_PROVIDER_SITE_OTHER): Payer: Medicare Other | Admitting: Ophthalmology

## 2015-05-05 DIAGNOSIS — I10 Essential (primary) hypertension: Secondary | ICD-10-CM | POA: Diagnosis not present

## 2015-05-05 DIAGNOSIS — H35033 Hypertensive retinopathy, bilateral: Secondary | ICD-10-CM | POA: Diagnosis not present

## 2015-05-05 DIAGNOSIS — H43813 Vitreous degeneration, bilateral: Secondary | ICD-10-CM | POA: Diagnosis not present

## 2015-05-05 DIAGNOSIS — H3531 Nonexudative age-related macular degeneration: Secondary | ICD-10-CM

## 2015-05-05 DIAGNOSIS — H3532 Exudative age-related macular degeneration: Secondary | ICD-10-CM | POA: Diagnosis not present

## 2015-06-02 ENCOUNTER — Encounter (INDEPENDENT_AMBULATORY_CARE_PROVIDER_SITE_OTHER): Payer: Medicare Other | Admitting: Ophthalmology

## 2015-06-02 DIAGNOSIS — I10 Essential (primary) hypertension: Secondary | ICD-10-CM | POA: Diagnosis not present

## 2015-06-02 DIAGNOSIS — H35033 Hypertensive retinopathy, bilateral: Secondary | ICD-10-CM | POA: Diagnosis not present

## 2015-06-02 DIAGNOSIS — H43813 Vitreous degeneration, bilateral: Secondary | ICD-10-CM

## 2015-06-02 DIAGNOSIS — H3531 Nonexudative age-related macular degeneration: Secondary | ICD-10-CM | POA: Diagnosis not present

## 2015-06-02 DIAGNOSIS — H3532 Exudative age-related macular degeneration: Secondary | ICD-10-CM

## 2015-06-20 ENCOUNTER — Ambulatory Visit (INDEPENDENT_AMBULATORY_CARE_PROVIDER_SITE_OTHER): Payer: Medicare Other | Admitting: Internal Medicine

## 2015-06-20 ENCOUNTER — Encounter: Payer: Self-pay | Admitting: Internal Medicine

## 2015-06-20 VITALS — BP 122/74 | HR 78 | Temp 98.1°F | Ht 68.0 in | Wt 156.0 lb

## 2015-06-20 DIAGNOSIS — E785 Hyperlipidemia, unspecified: Secondary | ICD-10-CM | POA: Diagnosis not present

## 2015-06-20 DIAGNOSIS — R42 Dizziness and giddiness: Secondary | ICD-10-CM | POA: Insufficient documentation

## 2015-06-20 DIAGNOSIS — I5022 Chronic systolic (congestive) heart failure: Secondary | ICD-10-CM | POA: Diagnosis not present

## 2015-06-20 DIAGNOSIS — R5382 Chronic fatigue, unspecified: Secondary | ICD-10-CM | POA: Diagnosis not present

## 2015-06-20 DIAGNOSIS — I2510 Atherosclerotic heart disease of native coronary artery without angina pectoris: Secondary | ICD-10-CM | POA: Diagnosis not present

## 2015-06-20 DIAGNOSIS — I10 Essential (primary) hypertension: Secondary | ICD-10-CM | POA: Diagnosis not present

## 2015-06-20 HISTORY — DX: Dizziness and giddiness: R42

## 2015-06-20 MED ORDER — MECLIZINE HCL 25 MG PO TABS: 25.0000 mg | ORAL_TABLET | Freq: Two times a day (BID) | ORAL | Status: DC | PRN

## 2015-06-20 NOTE — Assessment & Plan Note (Signed)
Seems to be euvolemic despite the fact that he is not taking Lasix or potassium for a while (a year?).

## 2015-06-20 NOTE — Assessment & Plan Note (Signed)
On simvastatin and gemfibrozil, check labs.

## 2015-06-20 NOTE — Progress Notes (Signed)
Pre visit review using our clinic review tool, if applicable. No additional management support is needed unless otherwise documented below in the visit note. 

## 2015-06-20 NOTE — Patient Instructions (Signed)
Get your blood work before you leave   ---------------------   Rest, fluids , tylenol  For cough: Take Mucinex DM twice a day as needed until better  For nasal congestion Use OTC Nasocort or Flonase : 2 nasal sprays on each side of the nose daily until you feel better  Call if not gradually better over the next  10 days  Call anytime if the symptoms are severe, you have high fever, short of breath, chest pain  -------------------------  Once your better, please come back for a pneumonia shot called  Prevnar I recommend a flu shot this fall

## 2015-06-20 NOTE — Progress Notes (Signed)
Subjective:    Patient ID: Christopher Hogan, male    DOB: 12-12-1924, 81 y.o.   MRN: [id number]  DOS:  06/20/2015 Type of visit - description : Routine visit, here with his wife Interval history: long history of dizziness, on and off, described as a spinning, symptoms are not more frequent or intense but recently tried antivert and it worked, would like a prescription CAD, CHF: Note from cardiology reviewed, he was  felt to be a stable, reports he has not been taking the Lasix and potassium for a while (?~ 1 year) Developed a cold last week, feeling better, cough has decrease, still has some nasal discharge. Appetite has been decreased  Review of Systems  Denies fever or chills Admits to some fatigue for a few months but denies chest pain, difficulty breathing, lower extremity edema or DOE No paresthesias No anxiety, depression. He sleeps well, states he uses Elavil as needed. Denies any headaches, slurred speech, diplopia, motor deficits.  Past Medical History  Diagnosis Date  . CAD (coronary artery disease)     stable stress test 7/11  . CHF (congestive heart failure)   . Hypertension   . Hyperlipidemia   . ED (erectile dysfunction)   . Allergic rhinitis   . GERD (gastroesophageal reflux disease)   . AAA (abdominal aortic aneurysm)     repaired 02/2002, renal ultrasound, Sept 2010: normal abdominal aorta, normal renal arteries   . Myocardial infarction 1989    interior wall infarction    Past Surgical History  Procedure Laterality Date  . Cholecystectomy    . Inguinal hernia repair Right 1985  . Aaa repair  2003  . Carotid ultrasound  03-2010    negative  . Ptca  1989, 1992    History   Social History  . Marital Status: Married    Spouse Name: N/A  . Number of Children: 3  . Years of Education: N/A   Occupational History  . retired     Social History Main Topics  . Smoking status: Former Research scientist (life sciences)  . Smokeless tobacco: Not on file     Comment: quit in 1953  .  Alcohol Use: No  . Drug Use: Not on file  . Sexual Activity: Not on file   Other Topics Concern  . Not on file   Social History Narrative   WWII veteran   Independent on his ADL          Medication List       This list is accurate as of: 06/20/15  4:49 PM.  Always use your most recent med list.               amitriptyline 10 MG tablet  Commonly known as:  ELAVIL  Take 10 mg by mouth at bedtime as needed.     amLODipine 10 MG tablet  Commonly known as:  NORVASC  Take 10 mg by mouth daily.     aspirin 81 MG chewable tablet  Chew 162 mg by mouth daily.     atenolol 50 MG tablet  Commonly known as:  TENORMIN  TAKE ONE TABLET BY MOUTH EVERY DAY     CLARITIN 10 MG tablet  Generic drug:  loratadine  Take 10 mg by mouth daily.     Dorzolamide HCl-Timolol Mal PF 22.3-6.8 MG/ML Soln  Apply 1 drop to eye 2 (two) times daily.     gemfibrozil 600 MG tablet  Commonly known as:  LOPID  TAKE ONE TABLET BY  MOUTH EVERY DAY     ICAPS AREDS FORMULA PO  Take 1 capsule by mouth daily.     latanoprost 0.005 % ophthalmic solution  Commonly known as:  XALATAN  Place 1 drop into both eyes at bedtime.     meclizine 25 MG tablet  Commonly known as:  ANTIVERT  Take 1 tablet (25 mg total) by mouth 2 (two) times daily as needed for dizziness.     multivitamin capsule  Take 1 capsule by mouth daily.     omeprazole 20 MG capsule  Commonly known as:  PRILOSEC  Take 20 mg by mouth daily.     simvastatin 10 MG tablet  Commonly known as:  ZOCOR  Take 1 tablet (10 mg total) by mouth at bedtime.           Objective:   Physical Exam BP 122/74 mmHg  Pulse 78  Temp(Src) 98.1 F (36.7 C) (Oral)  Ht 5\' 8"  (1.727 m)  Wt 156 lb (70.761 kg)  BMI 23.73 kg/m2  SpO2 98% General:   Well developed, well nourished . NAD.  HEENT:  Normocephalic . Face symmetric, atraumatic. Also slightly congested, throat without redness or discharge. Sinuses no TTP Lungs:  CTA B Normal  respiratory effort, no intercostal retractions, no accessory muscle use. Heart: RRR,  no murmur.  No pretibial edema bilaterally  Skin: Not pale. Not jaundice Neurologic:  alert & oriented X3.  Speech normal, gait appropriate for age and unassisted Psych--  Cognition and judgment appear intact.  Cooperative with normal attention span and concentration.  Behavior appropriate. No anxious or depressed appearing.      Assessment & Plan:   URI: Recommend conservative treatment, see instructions   Fatigue: Check a F29, folic acid and CBC  Also, recommend a Prevnar once he is feeling completely well from the cold

## 2015-06-20 NOTE — Assessment & Plan Note (Signed)
Reports a long history of dizziness, Antivert helps. Request a prescription which is granted, warned about somnolece

## 2015-06-20 NOTE — Assessment & Plan Note (Signed)
Controlled on amlodipine and atenolol

## 2015-06-21 LAB — COMPREHENSIVE METABOLIC PANEL
ALT: 22 U/L (ref 0–53)
AST: 19 U/L (ref 0–37)
Albumin: 3.9 g/dL (ref 3.5–5.2)
Alkaline Phosphatase: 51 U/L (ref 39–117)
BUN: 13 mg/dL (ref 6–23)
CO2: 27 mEq/L (ref 19–32)
Calcium: 8.7 mg/dL (ref 8.4–10.5)
Chloride: 102 mEq/L (ref 96–112)
Creatinine, Ser: 0.88 mg/dL (ref 0.40–1.50)
GFR: 86.92 mL/min (ref >60.00)
Glucose, Bld: 92 mg/dL (ref 70–99)
Potassium: 4 mEq/L (ref 3.5–5.1)
Sodium: 137 mEq/L (ref 135–145)
Total Bilirubin: 0.8 mg/dL (ref 0.2–1.2)
Total Protein: 6.5 g/dL (ref 6.0–8.3)

## 2015-06-21 LAB — CBC WITH DIFFERENTIAL/PLATELET
Basophils Absolute: 0 10*3/uL (ref 0.0–0.1)
Basophils Relative: 0.3 % (ref 0.0–3.0)
Eosinophils Absolute: 0.3 10*3/uL (ref 0.0–0.7)
Eosinophils Relative: 3.7 % (ref 0.0–5.0)
HCT: 40 % (ref 39.0–52.0)
Hemoglobin: 13.5 g/dL (ref 13.0–17.0)
Lymphocytes Relative: 17.1 % (ref 12.0–46.0)
Lymphs Abs: 1.4 10*3/uL (ref 0.7–4.0)
MCHC: 33.7 g/dL (ref 30.0–36.0)
MCV: 95.2 fl (ref 78.0–100.0)
Monocytes Absolute: 1 10*3/uL (ref 0.1–1.0)
Monocytes Relative: 12.2 % — ABNORMAL HIGH (ref 3.0–12.0)
Neutro Abs: 5.5 10*3/uL (ref 1.4–7.7)
Neutrophils Relative %: 66.7 % (ref 43.0–77.0)
Platelets: 168 10*3/uL (ref 150.0–400.0)
RBC: 4.2 Mil/uL — ABNORMAL LOW (ref 4.22–5.81)
RDW: 14.6 % (ref 11.5–15.5)
WBC: 8.2 10*3/uL (ref 4.0–10.5)

## 2015-06-21 LAB — LIPID PANEL
Cholesterol: 108 mg/dL (ref 0–200)
HDL: 29 mg/dL — ABNORMAL LOW (ref >39.00)
LDL Cholesterol: 48 mg/dL (ref 0–99)
NonHDL: 79.12
Total CHOL/HDL Ratio: 4
Triglycerides: 158 mg/dL — ABNORMAL HIGH (ref 0.0–149.0)
VLDL: 31.6 mg/dL (ref 0.0–40.0)

## 2015-06-21 LAB — FOLATE: Folate: 9.7 ng/mL (ref >5.9)

## 2015-06-21 LAB — VITAMIN B12: Vitamin B-12: 328 pg/mL (ref 211–911)

## 2015-06-27 ENCOUNTER — Telehealth: Payer: Self-pay | Admitting: Internal Medicine

## 2015-06-27 MED ORDER — AMITRIPTYLINE HCL 10 MG PO TABS: 10.0000 mg | ORAL_TABLET | Freq: Every evening | ORAL | Status: DC | PRN

## 2015-06-27 NOTE — Telephone Encounter (Signed)
Please advise. Last OV 06/20/2015.

## 2015-06-27 NOTE — Telephone Encounter (Signed)
Amitriptyline 10 mg one by mouth daily at bedtime when necessary #30 and 3 refills

## 2015-06-27 NOTE — Telephone Encounter (Signed)
Caller name: Thelma Relationship to patient: wife Can be reached: [phone] Pharmacy: [facility] [address] [place] - [address] [address]  Reason for call: Pt wife requesting refill on amitriptyline  10mg . Pt takes it prn to help him sleep. He is out of meds. It was prescribed by the [place] and they are no longer going to provide it. Please call into local pharmacy or call with concerns.

## 2015-06-27 NOTE — Telephone Encounter (Signed)
Rx sent to [facility] as requested.  

## 2015-06-30 ENCOUNTER — Encounter (INDEPENDENT_AMBULATORY_CARE_PROVIDER_SITE_OTHER): Payer: Medicare Other | Admitting: Ophthalmology

## 2015-06-30 DIAGNOSIS — H3531 Nonexudative age-related macular degeneration: Secondary | ICD-10-CM | POA: Diagnosis not present

## 2015-06-30 DIAGNOSIS — H35033 Hypertensive retinopathy, bilateral: Secondary | ICD-10-CM

## 2015-06-30 DIAGNOSIS — H3532 Exudative age-related macular degeneration: Secondary | ICD-10-CM | POA: Diagnosis not present

## 2015-06-30 DIAGNOSIS — I10 Essential (primary) hypertension: Secondary | ICD-10-CM

## 2015-06-30 DIAGNOSIS — H43813 Vitreous degeneration, bilateral: Secondary | ICD-10-CM

## 2015-07-05 ENCOUNTER — Telehealth: Payer: Self-pay | Admitting: Internal Medicine

## 2015-07-05 MED ORDER — AMOXICILLIN 500 MG PO CAPS: 1000.0000 mg | ORAL_CAPSULE | Freq: Two times a day (BID) | ORAL | Status: DC

## 2015-07-05 MED ORDER — AZELASTINE HCL 0.1 % NA SOLN: 2.0000 | Freq: Every evening | NASAL | Status: DC | PRN

## 2015-07-05 MED ORDER — HYDROCODONE-HOMATROPINE 5-1.5 MG/5ML PO SYRP: 5.0000 mL | ORAL_SOLUTION | Freq: Every evening | ORAL | Status: DC | PRN

## 2015-07-05 NOTE — Telephone Encounter (Signed)
Spoke with [name], Pt's wife, informed her of Dr. Larose Kells recommendations to continue with Mucinex, Flonase, and add Astelin, nasal spray at night, as well as Amoxicillin and Hydrocodone syrup. Informed her if Pt not better in 1 week needs to be seen by Dr. Larose Kells again. Thelma verbalized understanding.

## 2015-07-05 NOTE — Telephone Encounter (Signed)
Was seen with cough, if he is not better recommend following: Continue Mucinex and Flonase add Astelin, prescription sent, tell the patient this is another nasal spray Start amoxicillin, prescription sent  Start  hydrocodone at bedtime, watch for drowsiness, this is a cough suppressant. Office visit if not better in one week

## 2015-07-05 NOTE — Telephone Encounter (Signed)
Caller name:Thelma Relation to KK:DPTELM Call back number:[phone] Pharmacy:  Reason for call: pt was seen on 8/1, wife states that dr. Larose Kells stated to inform him if pt was not feeling anybetter, wife states that pt is still not feeling good and is still congested and pt is not sleeping well because of coughing, wife would like to know if pt can get a referral to a specialist or should he come back in to see dr. Larose Kells. States that dr. Larose Kells had mentioned a specialist the last visit/

## 2015-07-05 NOTE — Telephone Encounter (Signed)
Please advise 

## 2015-07-20 ENCOUNTER — Encounter: Payer: Medicare Other | Admitting: Internal Medicine

## 2015-08-02 ENCOUNTER — Telehealth: Payer: Self-pay | Admitting: Internal Medicine

## 2015-08-02 NOTE — Telephone Encounter (Signed)
Please advise 

## 2015-08-02 NOTE — Telephone Encounter (Signed)
Relation to pt:Mrs Findling Call back number:  [phone]    Reason for call:  Patient states azelastine (ASTELIN) 0.1 % nasal spray is to expensive requesting an alternate medication [facility]  Phone # [phone] and fax # [phone]. In addition spouse states office notes are required supporting why medication was prescribed

## 2015-08-02 NOTE — Telephone Encounter (Signed)
According to my notes, he said Astelin did not help, please call the patient and clarify what is needed

## 2015-08-03 NOTE — Telephone Encounter (Signed)
Received fax confirmation on 08/03/2015 at 0909.

## 2015-08-03 NOTE — Telephone Encounter (Signed)
Spoke with Pt, he informed me that he can get Astelin spray at a discounted price at the [place], per Pt, Astelin is around $90 dollars out-of-pocket through local pharmacies. He informed me that if we faxed latest OV to the [facility], it should help him get a discounted price. Informed him I would fax latest OV note from 06/20/2015 to the [facility] at the number given at [phone]. Pt verbalized understanding.

## 2015-08-04 ENCOUNTER — Encounter (INDEPENDENT_AMBULATORY_CARE_PROVIDER_SITE_OTHER): Payer: Medicare Other | Admitting: Ophthalmology

## 2015-08-04 DIAGNOSIS — H35033 Hypertensive retinopathy, bilateral: Secondary | ICD-10-CM | POA: Diagnosis not present

## 2015-08-04 DIAGNOSIS — H3531 Nonexudative age-related macular degeneration: Secondary | ICD-10-CM

## 2015-08-04 DIAGNOSIS — I10 Essential (primary) hypertension: Secondary | ICD-10-CM

## 2015-08-04 DIAGNOSIS — H43813 Vitreous degeneration, bilateral: Secondary | ICD-10-CM | POA: Diagnosis not present

## 2015-08-04 DIAGNOSIS — H3532 Exudative age-related macular degeneration: Secondary | ICD-10-CM | POA: Diagnosis not present

## 2015-08-25 DIAGNOSIS — H401121 Primary open-angle glaucoma, left eye, mild stage: Secondary | ICD-10-CM | POA: Diagnosis not present

## 2015-08-25 DIAGNOSIS — H401113 Primary open-angle glaucoma, right eye, severe stage: Secondary | ICD-10-CM | POA: Diagnosis not present

## 2015-08-25 DIAGNOSIS — H04123 Dry eye syndrome of bilateral lacrimal glands: Secondary | ICD-10-CM | POA: Diagnosis not present

## 2015-08-25 DIAGNOSIS — Z961 Presence of intraocular lens: Secondary | ICD-10-CM | POA: Diagnosis not present

## 2015-09-08 ENCOUNTER — Encounter (INDEPENDENT_AMBULATORY_CARE_PROVIDER_SITE_OTHER): Payer: Medicare Other | Admitting: Ophthalmology

## 2015-09-08 DIAGNOSIS — H353231 Exudative age-related macular degeneration, bilateral, with active choroidal neovascularization: Secondary | ICD-10-CM | POA: Diagnosis not present

## 2015-09-08 DIAGNOSIS — H35033 Hypertensive retinopathy, bilateral: Secondary | ICD-10-CM | POA: Diagnosis not present

## 2015-09-08 DIAGNOSIS — H43813 Vitreous degeneration, bilateral: Secondary | ICD-10-CM

## 2015-09-08 DIAGNOSIS — I10 Essential (primary) hypertension: Secondary | ICD-10-CM

## 2015-10-12 ENCOUNTER — Encounter (INDEPENDENT_AMBULATORY_CARE_PROVIDER_SITE_OTHER): Payer: Medicare Other | Admitting: Ophthalmology

## 2015-10-12 DIAGNOSIS — H353231 Exudative age-related macular degeneration, bilateral, with active choroidal neovascularization: Secondary | ICD-10-CM | POA: Diagnosis not present

## 2015-10-12 DIAGNOSIS — H35033 Hypertensive retinopathy, bilateral: Secondary | ICD-10-CM

## 2015-10-12 DIAGNOSIS — I10 Essential (primary) hypertension: Secondary | ICD-10-CM | POA: Diagnosis not present

## 2015-10-12 DIAGNOSIS — H43813 Vitreous degeneration, bilateral: Secondary | ICD-10-CM

## 2015-10-18 ENCOUNTER — Telehealth: Payer: Self-pay | Admitting: Internal Medicine

## 2015-10-18 NOTE — Telephone Encounter (Signed)
Okay to schedule at Pt's convenience.  

## 2015-10-18 NOTE — Telephone Encounter (Signed)
Pt wife called to schedule flu and pneumonia shot for this Friday 12/2 with the nurse.

## 2015-10-21 ENCOUNTER — Ambulatory Visit (INDEPENDENT_AMBULATORY_CARE_PROVIDER_SITE_OTHER): Payer: Medicare Other | Admitting: Behavioral Health

## 2015-10-21 DIAGNOSIS — Z23 Encounter for immunization: Secondary | ICD-10-CM

## 2015-10-21 NOTE — Progress Notes (Signed)
Pre visit review using our clinic review tool, if applicable. No additional management support is needed unless otherwise documented below in the visit note. 

## 2015-11-11 ENCOUNTER — Encounter (INDEPENDENT_AMBULATORY_CARE_PROVIDER_SITE_OTHER): Payer: Medicare Other | Admitting: Ophthalmology

## 2015-11-11 DIAGNOSIS — I10 Essential (primary) hypertension: Secondary | ICD-10-CM | POA: Diagnosis not present

## 2015-11-11 DIAGNOSIS — H353231 Exudative age-related macular degeneration, bilateral, with active choroidal neovascularization: Secondary | ICD-10-CM | POA: Diagnosis not present

## 2015-11-11 DIAGNOSIS — H35033 Hypertensive retinopathy, bilateral: Secondary | ICD-10-CM | POA: Diagnosis not present

## 2015-11-11 DIAGNOSIS — H43813 Vitreous degeneration, bilateral: Secondary | ICD-10-CM | POA: Diagnosis not present

## 2015-12-16 ENCOUNTER — Encounter (INDEPENDENT_AMBULATORY_CARE_PROVIDER_SITE_OTHER): Payer: Medicare Other | Admitting: Ophthalmology

## 2015-12-16 DIAGNOSIS — H35033 Hypertensive retinopathy, bilateral: Secondary | ICD-10-CM | POA: Diagnosis not present

## 2015-12-16 DIAGNOSIS — H43813 Vitreous degeneration, bilateral: Secondary | ICD-10-CM

## 2015-12-16 DIAGNOSIS — I10 Essential (primary) hypertension: Secondary | ICD-10-CM

## 2015-12-16 DIAGNOSIS — H353231 Exudative age-related macular degeneration, bilateral, with active choroidal neovascularization: Secondary | ICD-10-CM | POA: Diagnosis not present

## 2015-12-20 ENCOUNTER — Encounter: Payer: Self-pay | Admitting: *Deleted

## 2015-12-21 ENCOUNTER — Ambulatory Visit (INDEPENDENT_AMBULATORY_CARE_PROVIDER_SITE_OTHER): Payer: Medicare Other | Admitting: Internal Medicine

## 2015-12-21 ENCOUNTER — Encounter: Payer: Self-pay | Admitting: Internal Medicine

## 2015-12-21 VITALS — BP 128/76 | HR 67 | Temp 97.6°F | Ht 68.0 in | Wt 161.1 lb

## 2015-12-21 DIAGNOSIS — I5022 Chronic systolic (congestive) heart failure: Secondary | ICD-10-CM

## 2015-12-21 DIAGNOSIS — E785 Hyperlipidemia, unspecified: Secondary | ICD-10-CM

## 2015-12-21 DIAGNOSIS — I2510 Atherosclerotic heart disease of native coronary artery without angina pectoris: Secondary | ICD-10-CM | POA: Diagnosis not present

## 2015-12-21 DIAGNOSIS — Z0000 Encounter for general adult medical examination without abnormal findings: Secondary | ICD-10-CM

## 2015-12-21 DIAGNOSIS — I10 Essential (primary) hypertension: Secondary | ICD-10-CM

## 2015-12-21 LAB — LIPID PANEL
Cholesterol: 108 mg/dL (ref 0–200)
HDL: 31.2 mg/dL — ABNORMAL LOW (ref >39.00)
LDL Cholesterol: 47 mg/dL (ref 0–99)
NonHDL: 76.84
Total CHOL/HDL Ratio: 3
Triglycerides: 147 mg/dL (ref 0.0–149.0)
VLDL: 29.4 mg/dL (ref 0.0–40.0)

## 2015-12-21 LAB — BASIC METABOLIC PANEL
BUN: 11 mg/dL (ref 6–23)
CO2: 27 mEq/L (ref 19–32)
Calcium: 8.8 mg/dL (ref 8.4–10.5)
Chloride: 104 mEq/L (ref 96–112)
Creatinine, Ser: 1 mg/dL (ref 0.40–1.50)
GFR: 74.91 mL/min (ref >60.00)
Glucose, Bld: 115 mg/dL — ABNORMAL HIGH (ref 70–99)
Potassium: 4.3 mEq/L (ref 3.5–5.1)
Sodium: 138 mEq/L (ref 135–145)

## 2015-12-21 LAB — ALT: ALT: 10 U/L (ref 0–53)

## 2015-12-21 LAB — AST: AST: 13 U/L (ref 0–37)

## 2015-12-21 NOTE — Progress Notes (Signed)
Subjective:    Patient ID: Christopher Hogan, male    DOB: 1925/09/15, 82 y.o.   MRN: [id number]  DOS:  12/21/2015 Type of visit - description :  Here for Medicare AWV:  1. Risk factors based on Past M, S, F history: reviewed 2. Physical Activities:  Home-yard chores, occ takes a walk 3. Depression/mood: neg screening  4. Hearing:  Has a hearing aid, L 5. ADL's: independent, drives  6. Fall Risk: had a mechanical fall ~ 1 month ago, no LOC- major injury, prevention discussed , see AVS 7. home Safety: does feel safe at home  8. Height, weight, & visual acuity: see VS, sees eye doctor regulalrly 9. Counseling: provided 10. Labs ordered based on risk factors: if needed  11. Referral Coordination: if needed 12. Care Plan, see assessment and plan , written personalized plan provided , see AVS 13. Cognitive Assessment: motor skills and cognition appropriate for age 68. Care team updated   15. End-of-life care discussed , has a HC POA  In addition, today we discussed the following: CAD: good compliance of medication, note from cardiology reviewed. HTN: Good compliance of medication, BP today is excellent. Allergic rhinitis: Has on and off postnasal dripping and occasional cough, uses sprays with good results. High cholesterol, good compliance with medication without apparent side effects  Review of Systems Constitutional: No fever. No chills. No unexplained wt changes. No unusual sweats  HEENT: No dental problems, no ear discharge, no facial swelling, no voice changes. No eye discharge, no eye  redness , no  intolerance to light  See above Respiratory: No wheezing , no  difficulty breathing. No cough , no mucus production  Cardiovascular: No CP, no leg swelling , no  Palpitations  GI: no nausea, no vomiting, no diarrhea , no  abdominal pain.  No blood in the stools. No dysphagia, no odynophagia    Endocrine: No polyphagia, no polyuria , no polydipsia  GU: No dysuria, gross  hematuria, difficulty urinating. No urinary urgency, no frequency.  Musculoskeletal: No joint swellings or unusual aches or pains  Skin: No change in the color of the skin, palor , no  Rash  Allergic, immunologic: No environmental allergies , no  food allergies  Neurological: No dizziness no  syncope. No headaches. No diplopia, no slurred, no slurred speech, no motor deficits, no facial  Numbness  Hematological: No enlarged lymph nodes, no easy bruising , no unusual bleedings  Psychiatry: No suicidal ideas, no hallucinations, no beavior problems, no confusion.  No unusual/severe anxiety, no depression   Past Medical History  Diagnosis Date  . CAD (coronary artery disease)     stable stress test 7/11  . CHF (congestive heart failure) ([place])   . Hypertension   . Hyperlipidemia   . ED (erectile dysfunction)   . Allergic rhinitis   . GERD (gastroesophageal reflux disease)   . AAA (abdominal aortic aneurysm) ([place])     repaired 02/2002, renal ultrasound, Sept 2010: normal abdominal aorta, normal renal arteries   . Myocardial infarction [facility]) 1989    interior wall infarction    Past Surgical History  Procedure Laterality Date  . Cholecystectomy    . Inguinal hernia repair Right 1985  . Aaa repair  2003  . Carotid ultrasound  03-2010    negative  . Ptca  1989, 1992    Social History   Social History  . Marital Status: Married    Spouse Name: N/A  . Number of Children: 3  .  Years of Education: N/A   Occupational History  . retired     Social History Main Topics  . Smoking status: Former Research scientist (life sciences)  . Smokeless tobacco: Not on file     Comment: quit in 1953  . Alcohol Use: No  . Drug Use: Not on file  . Sexual Activity: Not on file   Other Topics Concern  . Not on file   Social History Narrative   WWII veteran   Independent on his ADL     Lives w/ wife   Family History  Problem Relation Age of Onset  . Lung cancer Father   . Prostate cancer Neg Hx   . Diabetes  Neg Hx   . Colon cancer Neg Hx   . CAD Other     mother?        Medication List       This list is accurate as of: 12/21/15  3:20 PM.  Always use your most recent med list.               amitriptyline 10 MG tablet  Commonly known as:  ELAVIL  Take 1 tablet (10 mg total) by mouth at bedtime as needed for sleep.     amLODipine 10 MG tablet  Commonly known as:  NORVASC  Take 10 mg by mouth daily.     aspirin 81 MG chewable tablet  Chew 162 mg by mouth daily.     atenolol 50 MG tablet  Commonly known as:  TENORMIN  TAKE ONE TABLET BY MOUTH EVERY DAY     azelastine 0.1 % nasal spray  Commonly known as:  ASTELIN  Place 2 sprays into both nostrils at bedtime as needed for rhinitis. Use in each nostril as directed     CLARITIN 10 MG tablet  Generic drug:  loratadine  Take 10 mg by mouth daily.     Dorzolamide HCl-Timolol Mal PF 22.3-6.8 MG/ML Soln  Apply 1 drop to eye 2 (two) times daily.     gemfibrozil 600 MG tablet  Commonly known as:  LOPID  TAKE ONE TABLET BY MOUTH EVERY DAY     ICAPS AREDS FORMULA PO  Take 1 capsule by mouth daily.     latanoprost 0.005 % ophthalmic solution  Commonly known as:  XALATAN  Place 1 drop into both eyes at bedtime.     meclizine 25 MG tablet  Commonly known as:  ANTIVERT  Take 1 tablet (25 mg total) by mouth 2 (two) times daily as needed for dizziness.     multivitamin capsule  Take 1 capsule by mouth daily.     omeprazole 20 MG capsule  Commonly known as:  PRILOSEC  Take 20 mg by mouth daily.     simvastatin 10 MG tablet  Commonly known as:  ZOCOR  Take 1 tablet (10 mg total) by mouth at bedtime.           Objective:   Physical Exam BP 128/76 mmHg  Pulse 67  Temp(Src) 97.6 F (36.4 C) (Oral)  Ht 5\' 8"  (1.727 m)  Wt 161 lb 2 oz (73.086 kg)  BMI 24.50 kg/m2  SpO2 98% General:   Well developed, well nourished . NAD.  HEENT:  Normocephalic . Face symmetric, atraumatic Neck: No thyromegaly Lungs:  CTA  B Normal respiratory effort, no intercostal retractions, no accessory muscle use. Heart: RRR,  no murmur.  no pretibial edema bilaterally  Abdomen:  Not distended, soft, non-tender. No rebound or rigidity. No  mass,organomegaly Skin: Not pale. Not jaundice Neurologic:  alert & oriented X3.  Speech normal, gait appropriate for age and unassisted Psych--  Cognition and judgment appear intact.  Cooperative with normal attention span and concentration.  Behavior appropriate. No anxious or depressed appearing.    Assessment & Plan:   Assessment HTN Hyperlipidemia GERD Insomnia-- elavil prn All rhinitis CV: --CAD, MI 1989, stress test (-) 2011 --CHF --Last cards visit 12-2014, stable --AAA, s/p repair 2003, [place] 2010 ok Opht: glaucoma, macular degeneration  [facility]   Plan: HTN: Continue amlodipine, atenolol. Check a BMP Hyperlipidemia: Continue Lopid and simvastatin. Check a FLP Insomnia:cont Elavil as needed Allergic rhinitis: Well-controlled with nasal sprays Occasional dizziness: Encourage Antivert as needed, fall precautions discussed CAD, CHF: Stable, no change. RTC one year Plans to see his [place] doctor tomorrow

## 2015-12-21 NOTE — Assessment & Plan Note (Addendum)
Td 2013; pneumonia shot 2014; prevnar 2016; shingles shot 2016  CCS, prostate cancer screening: Never had colonoscopy, screening no further indicated  Diet- exercise discussed.

## 2015-12-21 NOTE — Progress Notes (Signed)
Pre visit review using our clinic review tool, if applicable. No additional management support is needed unless otherwise documented below in the visit note. 

## 2015-12-21 NOTE — Patient Instructions (Signed)
GO TO THE LAB : Get the blood work    GO TO THE FRONT DESK Schedule a complete physical exam to be done in 1 year  Please be fasting    Check the  blood pressure 2 or 3 times a month Be sure your blood pressure is between 110/65 and  145/85. If it is consistently higher or lower, let me know       Fall Prevention and Home Safety Falls cause injuries and can affect all age groups. It is possible to use preventive measures to significantly decrease the likelihood of falls. There are many simple measures which can make your home safer and prevent falls. OUTDOORS  Repair cracks and edges of walkways and driveways.  Remove high doorway thresholds.  Trim shrubbery on the main path into your home.  Have good outside lighting.  Clear walkways of tools, rocks, debris, and clutter.  Check that handrails are not broken and are securely fastened. Both sides of steps should have handrails.  Have leaves, snow, and ice cleared regularly.  Use sand or salt on walkways during winter months.  In the garage, clean up grease or oil spills. BATHROOM  Install night lights.  Install grab bars by the toilet and in the tub and shower.  Use non-skid mats or decals in the tub or shower.  Place a plastic non-slip stool in the shower to sit on, if needed.  Keep floors dry and clean up all water on the floor immediately.  Remove soap buildup in the tub or shower on a regular basis.  Secure bath mats with non-slip, double-sided rug tape.  Remove throw rugs and tripping hazards from the floors. BEDROOMS  Install night lights.  Make sure a bedside light is easy to reach.  Do not use oversized bedding.  Keep a telephone by your bedside.  Have a firm chair with side arms to use for getting dressed.  Remove throw rugs and tripping hazards from the floor. KITCHEN  Keep handles on pots and pans turned toward the center of the stove. Use back burners when possible.  Clean up spills  quickly and allow time for drying.  Avoid walking on wet floors.  Avoid hot utensils and knives.  Position shelves so they are not too high or low.  Place commonly used objects within easy reach.  If necessary, use a sturdy step stool with a grab bar when reaching.  Keep electrical cables out of the way.  Do not use floor polish or wax that makes floors slippery. If you must use wax, use non-skid floor wax.  Remove throw rugs and tripping hazards from the floor. STAIRWAYS  Never leave objects on stairs.  Place handrails on both sides of stairways and use them. Fix any loose handrails. Make sure handrails on both sides of the stairways are as long as the stairs.  Check carpeting to make sure it is firmly attached along stairs. Make repairs to worn or loose carpet promptly.  Avoid placing throw rugs at the top or bottom of stairways, or properly secure the rug with carpet tape to prevent slippage. Get rid of throw rugs, if possible.  Have an electrician put in a light switch at the top and bottom of the stairs. OTHER FALL PREVENTION TIPS  Wear low-heel or rubber-soled shoes that are supportive and fit well. Wear closed toe shoes.  When using a stepladder, make sure it is fully opened and both spreaders are firmly locked. Do not climb a closed  stepladder.  Add color or contrast paint or tape to grab bars and handrails in your home. Place contrasting color strips on first and last steps.  Learn and use mobility aids as needed. Install an electrical emergency response system.  Turn on lights to avoid dark areas. Replace light bulbs that burn out immediately. Get light switches that glow.  Arrange furniture to create clear pathways. Keep furniture in the same place.  Firmly attach carpet with non-skid or double-sided tape.  Eliminate uneven floor surfaces.  Select a carpet pattern that does not visually hide the edge of steps.  Be aware of all pets. OTHER HOME SAFETY  TIPS  Set the water temperature for 120 F (48.8 C).  Keep emergency numbers on or near the telephone.  Keep smoke detectors on every level of the home and near sleeping areas. Document Released: 10/26/2002 Document Revised: 05/06/2012 Document Reviewed: 01/25/2012 [facility] Patient Information 2015 [place], [place]. This information is not intended to replace advice given to you by your health care provider. Make sure you discuss any questions you have with your health care provider.   Preventive Care for Adults Ages 5 and over  Blood pressure check.** / Every 1 to 2 years.  Lipid and cholesterol check.**/ Every 5 years beginning at age 9.  Lung cancer screening. / Every year if you are aged 67-80 years and have a 30-pack-year history of smoking and currently smoke or have quit within the past 15 years. Yearly screening is stopped once you have quit smoking for at least 15 years or develop a health problem that would prevent you from having lung cancer treatment.  Fecal occult blood test (FOBT) of stool. / Every year beginning at age 35 and continuing until age 46. You may not have to do this test if you get a colonoscopy every 10 years.  Flexible sigmoidoscopy** or colonoscopy.** / Every 5 years for a flexible sigmoidoscopy or every 10 years for a colonoscopy beginning at age 10 and continuing until age 61.  Hepatitis C blood test.** / For all people born from [birth year] through 1965 and any individual with known risks for hepatitis C.  Abdominal aortic aneurysm (AAA) screening.** / A one-time screening for ages 75 to 31 years who are current or former smokers.  Skin self-exam. / Monthly.  Influenza vaccine. / Every year.  Tetanus, diphtheria, and acellular pertussis (Tdap/Td) vaccine.** / 1 dose of Td every 10 years.  Varicella vaccine.** / Consult your health care provider.  Zoster vaccine.** / 1 dose for adults aged 81 years or older.  Pneumococcal 13-valent conjugate (PCV13)  vaccine.** / Consult your health care provider.  Pneumococcal polysaccharide (PPSV23) vaccine.** / 1 dose for all adults aged 3 years and older.  Meningococcal vaccine.** / Consult your health care provider.  Hepatitis A vaccine.** / Consult your health care provider.  Hepatitis B vaccine.** / Consult your health care provider.  Haemophilus influenzae type b (Hib) vaccine.** / Consult your health care provider. **Family history and personal history of risk and conditions may change your health care provider's recommendations. Document Released: 01/01/2002 Document Revised: 11/10/2013 Document Reviewed: 04/02/2011 [facility] Patient Information 2015 [place], [place]. This information is not intended to replace advice given to you by your health care provider. Make sure you discuss any questions you have with your health care provider.

## 2015-12-30 ENCOUNTER — Ambulatory Visit (INDEPENDENT_AMBULATORY_CARE_PROVIDER_SITE_OTHER): Payer: Medicare Other | Admitting: Cardiology

## 2015-12-30 ENCOUNTER — Telehealth: Payer: Self-pay | Admitting: Internal Medicine

## 2015-12-30 ENCOUNTER — Encounter: Payer: Self-pay | Admitting: Cardiology

## 2015-12-30 VITALS — BP 126/62 | HR 58 | Ht 68.0 in | Wt 159.0 lb

## 2015-12-30 DIAGNOSIS — R079 Chest pain, unspecified: Secondary | ICD-10-CM | POA: Diagnosis not present

## 2015-12-30 DIAGNOSIS — I25810 Atherosclerosis of coronary artery bypass graft(s) without angina pectoris: Secondary | ICD-10-CM

## 2015-12-30 DIAGNOSIS — I10 Essential (primary) hypertension: Secondary | ICD-10-CM | POA: Diagnosis not present

## 2015-12-30 DIAGNOSIS — E785 Hyperlipidemia, unspecified: Secondary | ICD-10-CM | POA: Diagnosis not present

## 2015-12-30 DIAGNOSIS — I951 Orthostatic hypotension: Secondary | ICD-10-CM

## 2015-12-30 MED ORDER — AMLODIPINE BESYLATE 5 MG PO TABS: 5.0000 mg | ORAL_TABLET | Freq: Every day | ORAL | Status: DC

## 2015-12-30 MED ORDER — AMLODIPINE BESYLATE 10 MG PO TABS: 5.0000 mg | ORAL_TABLET | Freq: Every day | ORAL | Status: DC

## 2015-12-30 NOTE — Progress Notes (Signed)
Patient ID: Christopher Hogan, male   DOB: [birth year], 82 y.o.   MRN: [id number]    Patient Name: Christopher Hogan Date of Encounter: 12/30/2015  Primary Care Provider:  [name], MD Primary Cardiologist:  Dorothy Spark (prior Dr Verl Blalock)  Problem List   Past Medical History  Diagnosis Date  . CAD (coronary artery disease)     stable stress test 7/11  . CHF (congestive heart failure) ([place])   . Hypertension   . Hyperlipidemia   . ED (erectile dysfunction)   . Allergic rhinitis   . GERD (gastroesophageal reflux disease)   . AAA (abdominal aortic aneurysm) ([place])     repaired 02/2002, renal ultrasound, Sept 2010: normal abdominal aorta, normal renal arteries   . Myocardial infarction [facility]) 1989    interior wall infarction   Past Surgical History  Procedure Laterality Date  . Cholecystectomy    . Inguinal hernia repair Right 1985  . Aaa repair  2003  . Carotid ultrasound  03-2010    negative  . Ptca  1989, 1992   Allergies  Allergies  Allergen Reactions  . Ace Inhibitors     REACTION: cough  . Spironolactone     REACTION: rash   HPI  [name] is a 82 year old gentleman was being followed for coronary artery disease,myocardial infarction in 1989 history of hypertension, and history of hyperlipidemia.  He's doing well with no complaints today. He specifically denies any angina, chest pain, palpitations, presyncope, orthopnea, PND or edema. However his wife states that he hasn't been very active in couple nights ago he woke up chest pain that felt like heartburn. It took several hours before it subsided. He also describes dizziness when he stands up too fast. Otherwise no palpitations or syncope. No orthopnea or paroxysmal nocturnal dyspnea.  Home Medications  Prior to Admission medications   Medication Sig Start Date End Date Taking? Authorizing Provider  amitriptyline (ELAVIL) 10 MG tablet Take 10 mg by mouth at bedtime as needed. 12/31/11  Yes Colon Branch, MD    amLODipine (NORVASC) 10 MG tablet Take 10 mg by mouth daily.     Yes Historical Provider, MD  aspirin 81 MG chewable tablet Chew 162 mg by mouth daily.    Yes Historical Provider, MD  atenolol (TENORMIN) 50 MG tablet TAKE ONE TABLET BY MOUTH EVERY DAY 11/26/13  Yes Dorothy Spark, MD  clotrimazole-betamethasone (LOTRISONE) cream USE AS DIRECTED. IF RASH PERSISTS NEEDS APPOINTMENT 07/07/13  Yes Colon Branch, MD  Dorzolamide HCl-Timolol Mal PF 22.3-6.8 MG/ML SOLN Apply 1 drop to eye 2 (two) times daily.   Yes Historical Provider, MD  fish oil-omega-3 fatty acids 1000 MG capsule Take 1 g by mouth daily.    Yes Historical Provider, MD  furosemide (LASIX) 20 MG tablet TAKE ONE TABLET BY MOUTH ONCE DAILY 11/23/13  Yes Colon Branch, MD  gemfibrozil (LOPID) 600 MG tablet TAKE ONE TABLET BY MOUTH EVERY DAY 04/22/13  Yes Colon Branch, MD  latanoprost (XALATAN) 0.005 % ophthalmic solution Place 1 drop into both eyes at bedtime.   Yes Historical Provider, MD  loratadine (CLARITIN) 10 MG tablet Take 10 mg by mouth daily.   Yes Historical Provider, MD  Multiple Vitamin (MULTIVITAMIN) capsule Take 1 capsule by mouth daily.     Yes Historical Provider, MD  Multiple Vitamins-Minerals (OCUVITE PRESERVISION PO) Take by mouth.     Yes Historical Provider, MD  niacin (NIASPAN) 500 MG CR tablet Take 500 mg by  mouth at bedtime.     Yes Historical Provider, MD  omeprazole (PRILOSEC) 20 MG capsule Take 20 mg by mouth daily.     Yes Historical Provider, MD  potassium chloride SA (K-DUR,KLOR-CON) 20 MEQ tablet Take 20 mEq by mouth daily.     Yes Historical Provider, MD  simvastatin (ZOCOR) 10 MG tablet Take 1 tablet (10 mg total) by mouth at bedtime. 05/14/12  Yes Colon Branch, MD    Family History  Family History  Problem Relation Age of Onset  . Lung cancer Father   . Prostate cancer Neg Hx   . Diabetes Neg Hx   . Colon cancer Neg Hx   . CAD Other     mother?    Social History  Social History   Social History  .  Marital Status: Married    Spouse Name: N/A  . Number of Children: 3  . Years of Education: N/A   Occupational History  . retired     Social History Main Topics  . Smoking status: Former Research scientist (life sciences)  . Smokeless tobacco: Not on file     Comment: quit in 1953  . Alcohol Use: No  . Drug Use: Not on file  . Sexual Activity: Not on file   Other Topics Concern  . Not on file   Social History Narrative   WWII veteran   Independent on his ADL     Lives w/ wife     Review of Systems, as per HPI, otherwise negative General:  No chills, fever, night sweats or weight changes.  Cardiovascular:  No chest pain, dyspnea on exertion, edema, orthopnea, palpitations, paroxysmal nocturnal dyspnea. Dermatological: No rash, lesions/masses Respiratory: No cough, dyspnea Urologic: No hematuria, dysuria Abdominal:   No nausea, vomiting, diarrhea, bright red blood per rectum, melena, or hematemesis Neurologic:  No visual changes, wkns, changes in mental status. All other systems reviewed and are otherwise negative except as noted above.  Physical Exam  Blood pressure 126/62, pulse 58, height 5\' 8"  (1.727 m), weight 159 lb (72.122 kg).  General: Pleasant, NAD Psych: Normal affect. Neuro: Alert and oriented X 3. Moves all extremities spontaneously. HEENT: Normal  Neck: Supple without bruits or JVD. Lungs:  Resp regular and unlabored, CTA. Heart: RRR no s3, s4, or murmurs. Abdomen: Soft, non-tender, non-distended, BS + x 4.  Extremities: No clubbing, cyanosis or edema. DP/PT/Radials 2+ and equal bilaterally.  Labs:  No results for input(s): CKTOTAL, CKMB, TROPONINI in the last 72 hours. Lab Results  Component Value Date   WBC 8.2 06/20/2015   HGB 13.5 06/20/2015   HCT 40.0 06/20/2015   MCV 95.2 06/20/2015   PLT 168.0 06/20/2015   No results for input(s): NA, K, CL, CO2, BUN, CREATININE, CALCIUM, PROT, BILITOT, ALKPHOS, ALT, AST, GLUCOSE in the last 168 hours.  Invalid input(s):  LABALBU Lab Results  Component Value Date   CHOL 108 12/21/2015   HDL 31.20* 12/21/2015   LDLCALC 47 12/21/2015   TRIG 147.0 12/21/2015    Accessory Clinical Findings  Echocardiogram - none in epic  ECG - 12/30/2015: sinus bradycardia, prior inferior infarct age undetermined, abnormal EKG, unchanged from prior in 2013.    Assessment & Plan  A very pleasant 82 year old male formrly followed by Dr Verl Blalock  1. CAD, MI in 1989, negative stress test in 2011, the had an episode of chest pain and his EKG shows new incomplete right bundle branch block and negative T waves in the lateral leads. We'll  schedule a Lexiscan nuclear stress test to evaluate for ischemia.  2. Hypertension - controlled  3. Hyperlipidemia - at goal LDL and HDL, and TAG on gemfibrozil and simvastatin  4. Chronic systolic  CHF - based on exercise stress echo in 2008, the patient had inferior scar and LVEF 48%. He appears euvolemic.  5. Orthostatic hypotension - decrease amlodipine to 5 mg po daily  Follow up in 6 months.   Dorothy Spark, MD, [facility] 12/30/2015, 11:50 AM

## 2015-12-30 NOTE — Patient Instructions (Signed)
Medication Instructions:  Your physician recommends that you continue on your current medications as directed. Please refer to the Current Medication list given to you today.   Labwork: -None  Testing/Procedures: Your physician has requested that you have a lexiscan myoview. For further information please visit [email or web address]. Please follow instruction sheet, as given.    Follow-Up: Your physician wants you to follow-up in: 6 months with Dr. Meda Coffee. You will receive a reminder letter in the mail two months in advance. If you don't receive a letter, please call our office to schedule the follow-up appointment.   Any Other Special Instructions Will Be Listed Below (If Applicable).     If you need a refill on your cardiac medications before your next appointment, please call your pharmacy.

## 2015-12-30 NOTE — Telephone Encounter (Signed)
Please call after 3:00pm today - wife and pt are wanting to know if he needs to limit his sugar intake due to levels being elevated.

## 2015-12-30 NOTE — Telephone Encounter (Signed)
Spoke with [name], Pt's spouse, she informed me that Pt's saw Cardiologist today and discussed blood sugar results.

## 2016-01-06 ENCOUNTER — Encounter (INDEPENDENT_AMBULATORY_CARE_PROVIDER_SITE_OTHER): Payer: Medicare Other | Admitting: Ophthalmology

## 2016-01-06 DIAGNOSIS — H35033 Hypertensive retinopathy, bilateral: Secondary | ICD-10-CM

## 2016-01-06 DIAGNOSIS — H353231 Exudative age-related macular degeneration, bilateral, with active choroidal neovascularization: Secondary | ICD-10-CM

## 2016-01-06 DIAGNOSIS — H43813 Vitreous degeneration, bilateral: Secondary | ICD-10-CM | POA: Diagnosis not present

## 2016-01-06 DIAGNOSIS — I10 Essential (primary) hypertension: Secondary | ICD-10-CM | POA: Diagnosis not present

## 2016-01-10 ENCOUNTER — Telehealth (HOSPITAL_COMMUNITY): Payer: Self-pay

## 2016-01-10 NOTE — Telephone Encounter (Signed)
Patient given detailed instructions per Myocardial Perfusion Study Information Sheet for the test on 01/13/2016 at 8:45. Patient notified to arrive 15 minutes early and that it is imperative to arrive on time for appointment to keep from having the test rescheduled.  If you need to cancel or reschedule your appointment, please call the office within 24 hours of your appointment. Failure to do so may result in a cancellation of your appointment, and a $50 no show fee. Patient verbalized understanding.smw

## 2016-01-13 ENCOUNTER — Ambulatory Visit (HOSPITAL_COMMUNITY): Payer: Medicare Other | Attending: Cardiovascular Disease

## 2016-01-13 DIAGNOSIS — I25810 Atherosclerosis of coronary artery bypass graft(s) without angina pectoris: Secondary | ICD-10-CM | POA: Diagnosis not present

## 2016-01-13 DIAGNOSIS — R079 Chest pain, unspecified: Secondary | ICD-10-CM | POA: Diagnosis not present

## 2016-01-13 DIAGNOSIS — R9439 Abnormal result of other cardiovascular function study: Secondary | ICD-10-CM | POA: Diagnosis not present

## 2016-01-13 DIAGNOSIS — I119 Hypertensive heart disease without heart failure: Secondary | ICD-10-CM | POA: Insufficient documentation

## 2016-01-13 DIAGNOSIS — I10 Essential (primary) hypertension: Secondary | ICD-10-CM | POA: Diagnosis not present

## 2016-01-13 LAB — MYOCARDIAL PERFUSION IMAGING
LV dias vol: 124 mL
LV sys vol: 83 mL
Peak HR: 85 {beats}/min
RATE: 0.29
Rest HR: 60 {beats}/min
SDS: 0
SRS: 19
SSS: 19
TID: 1.09

## 2016-01-13 MED ORDER — TECHNETIUM TC 99M SESTAMIBI GENERIC - CARDIOLITE
10.2000 | Freq: Once | INTRAVENOUS | Status: AC | PRN
  Administered 2016-01-13: 10 via INTRAVENOUS

## 2016-01-13 MED ORDER — REGADENOSON 0.4 MG/5ML IV SOLN
0.4000 mg | Freq: Once | INTRAVENOUS | Status: AC
  Administered 2016-01-13: 0.4 mg via INTRAVENOUS

## 2016-01-13 MED ORDER — TECHNETIUM TC 99M SESTAMIBI GENERIC - CARDIOLITE
31.6000 | Freq: Once | INTRAVENOUS | Status: AC | PRN
  Administered 2016-01-13: 32 via INTRAVENOUS

## 2016-01-20 ENCOUNTER — Encounter (INDEPENDENT_AMBULATORY_CARE_PROVIDER_SITE_OTHER): Payer: Medicare Other | Admitting: Ophthalmology

## 2016-01-20 DIAGNOSIS — H35033 Hypertensive retinopathy, bilateral: Secondary | ICD-10-CM

## 2016-01-20 DIAGNOSIS — I10 Essential (primary) hypertension: Secondary | ICD-10-CM

## 2016-01-20 DIAGNOSIS — H353231 Exudative age-related macular degeneration, bilateral, with active choroidal neovascularization: Secondary | ICD-10-CM | POA: Diagnosis not present

## 2016-01-20 DIAGNOSIS — H43813 Vitreous degeneration, bilateral: Secondary | ICD-10-CM | POA: Diagnosis not present

## 2016-02-24 ENCOUNTER — Encounter (INDEPENDENT_AMBULATORY_CARE_PROVIDER_SITE_OTHER): Payer: Medicare Other | Admitting: Ophthalmology

## 2016-02-24 DIAGNOSIS — H35033 Hypertensive retinopathy, bilateral: Secondary | ICD-10-CM

## 2016-02-24 DIAGNOSIS — H353231 Exudative age-related macular degeneration, bilateral, with active choroidal neovascularization: Secondary | ICD-10-CM | POA: Diagnosis not present

## 2016-02-24 DIAGNOSIS — H43813 Vitreous degeneration, bilateral: Secondary | ICD-10-CM | POA: Diagnosis not present

## 2016-02-24 DIAGNOSIS — I10 Essential (primary) hypertension: Secondary | ICD-10-CM | POA: Diagnosis not present

## 2016-03-15 DIAGNOSIS — H31092 Other chorioretinal scars, left eye: Secondary | ICD-10-CM | POA: Diagnosis not present

## 2016-03-15 DIAGNOSIS — H353124 Nonexudative age-related macular degeneration, left eye, advanced atrophic with subfoveal involvement: Secondary | ICD-10-CM | POA: Diagnosis not present

## 2016-03-15 DIAGNOSIS — H401113 Primary open-angle glaucoma, right eye, severe stage: Secondary | ICD-10-CM | POA: Diagnosis not present

## 2016-03-15 DIAGNOSIS — H353211 Exudative age-related macular degeneration, right eye, with active choroidal neovascularization: Secondary | ICD-10-CM | POA: Diagnosis not present

## 2016-03-15 DIAGNOSIS — Z961 Presence of intraocular lens: Secondary | ICD-10-CM | POA: Diagnosis not present

## 2016-03-15 DIAGNOSIS — H401121 Primary open-angle glaucoma, left eye, mild stage: Secondary | ICD-10-CM | POA: Diagnosis not present

## 2016-03-30 ENCOUNTER — Encounter (INDEPENDENT_AMBULATORY_CARE_PROVIDER_SITE_OTHER): Payer: Medicare Other | Admitting: Ophthalmology

## 2016-03-30 DIAGNOSIS — I10 Essential (primary) hypertension: Secondary | ICD-10-CM | POA: Diagnosis not present

## 2016-03-30 DIAGNOSIS — H35033 Hypertensive retinopathy, bilateral: Secondary | ICD-10-CM | POA: Diagnosis not present

## 2016-03-30 DIAGNOSIS — H353231 Exudative age-related macular degeneration, bilateral, with active choroidal neovascularization: Secondary | ICD-10-CM

## 2016-03-30 DIAGNOSIS — H43813 Vitreous degeneration, bilateral: Secondary | ICD-10-CM | POA: Diagnosis not present

## 2016-05-04 ENCOUNTER — Encounter (INDEPENDENT_AMBULATORY_CARE_PROVIDER_SITE_OTHER): Payer: Medicare Other | Admitting: Ophthalmology

## 2016-05-04 DIAGNOSIS — H35033 Hypertensive retinopathy, bilateral: Secondary | ICD-10-CM | POA: Diagnosis not present

## 2016-05-04 DIAGNOSIS — H43813 Vitreous degeneration, bilateral: Secondary | ICD-10-CM

## 2016-05-04 DIAGNOSIS — H353231 Exudative age-related macular degeneration, bilateral, with active choroidal neovascularization: Secondary | ICD-10-CM

## 2016-05-04 DIAGNOSIS — I10 Essential (primary) hypertension: Secondary | ICD-10-CM

## 2016-06-08 ENCOUNTER — Encounter (INDEPENDENT_AMBULATORY_CARE_PROVIDER_SITE_OTHER): Payer: Medicare Other | Admitting: Ophthalmology

## 2016-06-08 DIAGNOSIS — H43813 Vitreous degeneration, bilateral: Secondary | ICD-10-CM

## 2016-06-08 DIAGNOSIS — H353211 Exudative age-related macular degeneration, right eye, with active choroidal neovascularization: Secondary | ICD-10-CM | POA: Diagnosis not present

## 2016-06-08 DIAGNOSIS — H353124 Nonexudative age-related macular degeneration, left eye, advanced atrophic with subfoveal involvement: Secondary | ICD-10-CM | POA: Diagnosis not present

## 2016-06-08 DIAGNOSIS — H35033 Hypertensive retinopathy, bilateral: Secondary | ICD-10-CM

## 2016-06-08 DIAGNOSIS — I10 Essential (primary) hypertension: Secondary | ICD-10-CM | POA: Diagnosis not present

## 2016-06-19 ENCOUNTER — Telehealth: Payer: Self-pay | Admitting: Internal Medicine

## 2016-06-19 DIAGNOSIS — H6121 Impacted cerumen, right ear: Secondary | ICD-10-CM

## 2016-06-19 NOTE — Telephone Encounter (Signed)
ENT referral placed.

## 2016-06-19 NOTE — Telephone Encounter (Signed)
Pt's spouse called in to inform provider that his right ear is clogged up and would like a referral to a ENT.    CB: [id number] Hogan

## 2016-07-02 DIAGNOSIS — H6123 Impacted cerumen, bilateral: Secondary | ICD-10-CM | POA: Diagnosis not present

## 2016-07-13 ENCOUNTER — Encounter (INDEPENDENT_AMBULATORY_CARE_PROVIDER_SITE_OTHER): Payer: Medicare Other | Admitting: Ophthalmology

## 2016-07-13 DIAGNOSIS — I10 Essential (primary) hypertension: Secondary | ICD-10-CM | POA: Diagnosis not present

## 2016-07-13 DIAGNOSIS — H43813 Vitreous degeneration, bilateral: Secondary | ICD-10-CM

## 2016-07-13 DIAGNOSIS — H35033 Hypertensive retinopathy, bilateral: Secondary | ICD-10-CM

## 2016-07-13 DIAGNOSIS — H353231 Exudative age-related macular degeneration, bilateral, with active choroidal neovascularization: Secondary | ICD-10-CM | POA: Diagnosis not present

## 2016-07-17 ENCOUNTER — Encounter: Payer: Self-pay | Admitting: Internal Medicine

## 2016-07-17 ENCOUNTER — Ambulatory Visit (INDEPENDENT_AMBULATORY_CARE_PROVIDER_SITE_OTHER): Payer: Medicare Other | Admitting: Internal Medicine

## 2016-07-17 VITALS — BP 130/74 | HR 60 | Temp 98.1°F | Resp 14 | Ht 68.0 in | Wt 157.1 lb

## 2016-07-17 DIAGNOSIS — Z23 Encounter for immunization: Secondary | ICD-10-CM | POA: Diagnosis not present

## 2016-07-17 DIAGNOSIS — R5383 Other fatigue: Secondary | ICD-10-CM

## 2016-07-17 DIAGNOSIS — I25810 Atherosclerosis of coronary artery bypass graft(s) without angina pectoris: Secondary | ICD-10-CM | POA: Diagnosis not present

## 2016-07-17 DIAGNOSIS — Z09 Encounter for follow-up examination after completed treatment for conditions other than malignant neoplasm: Secondary | ICD-10-CM

## 2016-07-17 DIAGNOSIS — I2510 Atherosclerotic heart disease of native coronary artery without angina pectoris: Secondary | ICD-10-CM

## 2016-07-17 DIAGNOSIS — I10 Essential (primary) hypertension: Secondary | ICD-10-CM | POA: Diagnosis not present

## 2016-07-17 HISTORY — DX: Encounter for follow-up examination after completed treatment for conditions other than malignant neoplasm: Z09

## 2016-07-17 LAB — BASIC METABOLIC PANEL
BUN: 8 mg/dL (ref 6–23)
CO2: 30 mEq/L (ref 19–32)
Calcium: 8.7 mg/dL (ref 8.4–10.5)
Chloride: 103 mEq/L (ref 96–112)
Creatinine, Ser: 1.05 mg/dL (ref 0.40–1.50)
GFR: 70.72 mL/min (ref >60.00)
Glucose, Bld: 107 mg/dL — ABNORMAL HIGH (ref 70–99)
Potassium: 4.5 mEq/L (ref 3.5–5.1)
Sodium: 138 mEq/L (ref 135–145)

## 2016-07-17 LAB — CBC WITH DIFFERENTIAL/PLATELET
Basophils Absolute: 0.1 10*3/uL (ref 0.0–0.1)
Basophils Relative: 0.8 % (ref 0.0–3.0)
Eosinophils Absolute: 0.2 10*3/uL (ref 0.0–0.7)
Eosinophils Relative: 3.7 % (ref 0.0–5.0)
HCT: 42.5 % (ref 39.0–52.0)
Hemoglobin: 14.5 g/dL (ref 13.0–17.0)
Lymphocytes Relative: 19.1 % (ref 12.0–46.0)
Lymphs Abs: 1.3 10*3/uL (ref 0.7–4.0)
MCHC: 34 g/dL (ref 30.0–36.0)
MCV: 94.9 fl (ref 78.0–100.0)
Monocytes Absolute: 0.6 10*3/uL (ref 0.1–1.0)
Monocytes Relative: 9.6 % (ref 3.0–12.0)
Neutro Abs: 4.5 10*3/uL (ref 1.4–7.7)
Neutrophils Relative %: 66.8 % (ref 43.0–77.0)
Platelets: 172 10*3/uL (ref 150.0–400.0)
RBC: 4.48 Mil/uL (ref 4.22–5.81)
RDW: 14.5 % (ref 11.5–15.5)
WBC: 6.7 10*3/uL (ref 4.0–10.5)

## 2016-07-17 LAB — TSH: TSH: 3.08 u[IU]/mL (ref 0.35–4.50)

## 2016-07-17 NOTE — Progress Notes (Signed)
Pre visit review using our clinic review tool, if applicable. No additional management support is needed unless otherwise documented below in the visit note. 

## 2016-07-17 NOTE — Assessment & Plan Note (Signed)
CAD: Saw cardiology to 10 2017, Rx Lexiscan: No ischemia, Rx medical management HTN: Cardiology decrease amlodipine to 5 mg (12-2015) due to orthostatic BP. Still has occasional dizziness only when he stands up. BP well-controlled. No change Fatigue: 82 year old gentleman with multiple medical problems complaining of lack of energy, exam is benign with no volume overloaded, no fever-headaches-myalgias. Denies depression. Will get a CBC, BMP, TSH,   and vitamin D. Otherwise Rx observation. Call if worse 1 care- fliu shot today RTC, CPX 12-2016

## 2016-07-17 NOTE — Patient Instructions (Signed)
GO TO THE LAB : Get the blood work     GO TO THE FRONT DESK Schedule your next appointment for a   complete physical exam by 12-2016.

## 2016-07-17 NOTE — Progress Notes (Signed)
Subjective:    Patient ID: Christopher Hogan, male    DOB: August 30, 1925, 82 y.o.   MRN: [id number]  DOS:  07/17/2016 Type of visit - description : rov Interval history: Medications reviewed: Good compliance. Saw cardiology for months ago, note reviewed In general feeling well except that he continue with runny nose. Also for the last few months his energy level has decrease compared to previous years. No specific symptoms, just lack of energy.  Wt Readings from Last 3 Encounters:  07/17/16 157 lb 2 oz (71.3 kg)  12/30/15 159 lb (72.1 kg)  12/21/15 161 lb 2 oz (73.1 kg)    Review of Systems Denies any fever, chills. Appetite is okay, no major fluctuation of his weight although he has lost a couple pounds. Denies further chest pain, no palpitations. No DOE with activities of daily living No nausea, vomiting, diarrhea or blood in the stools but complains of abundant gas (flatus). No heartburn per se. Denies depression. No myalgias. No headaches. occasional dizziness only when he stands up  Past Medical History:  Diagnosis Date  . AAA (abdominal aortic aneurysm) ([place])    repaired 02/2002, renal ultrasound, Sept 2010: normal abdominal aorta, normal renal arteries   . Allergic rhinitis   . CAD (coronary artery disease)    stable stress test 7/11  . CHF (congestive heart failure) ([place])   . ED (erectile dysfunction)   . GERD (gastroesophageal reflux disease)   . Hyperlipidemia   . Hypertension   . Myocardial infarction ([place]) 1989   interior wall infarction    Past Surgical History:  Procedure Laterality Date  . AAA repair  2003  . CHOLECYSTECTOMY    . INGUINAL HERNIA REPAIR Right 1985  . PTCA  1989, 1992    Social History   Social History  . Marital status: Married    Spouse name: N/A  . Number of children: 3  . Years of education: N/A   Occupational History  . retired  Retired   Social History Main Topics  . Smoking status: Former Research scientist (life sciences)  . Smokeless tobacco:  Never Used     Comment: quit in 1953  . Alcohol use No  . Drug use: Unknown  . Sexual activity: Not on file   Other Topics Concern  . Not on file   Social History Narrative   WWII veteran   Independent on his ADL     Lives w/ wife        Medication List       Accurate as of 07/17/16  5:21 PM. Always use your most recent med list.          amitriptyline 10 MG tablet Commonly known as:  ELAVIL Take 1 tablet (10 mg total) by mouth at bedtime as needed for sleep.   amLODipine 5 MG tablet Commonly known as:  NORVASC Take 1 tablet (5 mg total) by mouth daily.   aspirin 81 MG chewable tablet Chew 162 mg by mouth daily.   atenolol 50 MG tablet Commonly known as:  TENORMIN TAKE ONE TABLET BY MOUTH EVERY DAY   azelastine 0.1 % nasal spray Commonly known as:  ASTELIN Place 2 sprays into both nostrils at bedtime as needed for rhinitis. Use in each nostril as directed   CLARITIN 10 MG tablet Generic drug:  loratadine Take 10 mg by mouth daily.   Dorzolamide HCl-Timolol Mal PF 22.3-6.8 MG/ML Soln Apply 1 drop to eye 2 (two) times daily.   gemfibrozil 600 MG tablet  Commonly known as:  LOPID TAKE ONE TABLET BY MOUTH EVERY DAY   ICAPS AREDS FORMULA PO Take 1 capsule by mouth daily.   latanoprost 0.005 % ophthalmic solution Commonly known as:  XALATAN Place 1 drop into both eyes at bedtime.   meclizine 25 MG tablet Commonly known as:  ANTIVERT Take 1 tablet (25 mg total) by mouth 2 (two) times daily as needed for dizziness.   multivitamin capsule Take 1 capsule by mouth daily.   omeprazole 20 MG capsule Commonly known as:  PRILOSEC Take 20 mg by mouth daily.   simvastatin 10 MG tablet Commonly known as:  ZOCOR Take 1 tablet (10 mg total) by mouth at bedtime.          Objective:   Physical Exam BP 130/74 (BP Location: Left Arm, Patient Position: Sitting, Cuff Size: Normal)   Pulse 60   Temp 98.1 F (36.7 C) (Oral)   Resp 14   Ht 5\' 8"  (1.727 m)    Wt 157 lb 2 oz (71.3 kg)   SpO2 99%   BMI 23.89 kg/m     General:   Well developed, well nourished . NAD.  HEENT:  Normocephalic . Face symmetric, atraumatic Neck: No JVD at 45 Lungs:  Crease breath sounds but clear Normal respiratory effort, no intercostal retractions, no accessory muscle use. Heart: RRR,  no murmur.  no pretibial edema bilaterally  Abdomen:  Not distended, soft, non-tender. No rebound or rigidity.  Skin: Not pale. Not jaundice Neurologic:  alert & oriented X3.  Speech normal, gait appropriate for age and unassisted Psych--  Cognition and judgment appear intact.  Cooperative with normal attention span and concentration.  Behavior appropriate. No anxious or depressed appearing.     Assessment & Plan:   Assessment HTN Hyperlipidemia GERD Insomnia-- elavil prn All rhinitis CV: --CAD, MI 1989, stress test (-) 2011 --CHF --Last cards visit 12-2014, stable --AAA, s/p repair 2003, [place] 2010 ok Opht: glaucoma, macular degeneration  [facility]   PLAN: CAD: Saw cardiology to 10 2017, Rx Lexiscan: No ischemia, Rx medical management HTN: Cardiology decrease amlodipine to 5 mg (12-2015) due to orthostatic BP. Still has occasional dizziness only when he stands up. BP well-controlled. No change Fatigue: 82 year old gentleman with multiple medical problems complaining of lack of energy, exam is benign with no volume overloaded, no fever-headaches-myalgias. Denies depression. Will get a CBC, BMP, TSH,   and vitamin D. Otherwise Rx observation. Call if worse 1 care- fliu shot today RTC, CPX 12-2016

## 2016-07-21 LAB — VITAMIN D 1,25 DIHYDROXY
Vitamin D 1, 25 (OH)2 Total: 31 pg/mL (ref 18–72)
Vitamin D2 1, 25 (OH)2: <8 pg/mL
Vitamin D3 1, 25 (OH)2: 31 pg/mL

## 2016-07-24 ENCOUNTER — Telehealth: Payer: Self-pay | Admitting: Internal Medicine

## 2016-07-24 NOTE — Telephone Encounter (Signed)
Spoke w/ [name], informed her of recommendations for a probiotic such as Align, or Digestive Advantage. Christopher Hogan verbalized understanding.

## 2016-07-24 NOTE — Telephone Encounter (Signed)
Caller name: Thelma Relationship to patient: Wife Can be reached: [phone]  Pharmacy:  [facility] [address] [place] - [place] [address] [phone] (Phone) [phone] (Fax)   Reason for call: Wife states that patient is having gas and needs something for it

## 2016-07-24 NOTE — Telephone Encounter (Signed)
I'm not sure if there is a specific prescription for gas, I recommend a trial with a probiotic called Align, one tablet daily x 4 weeks

## 2016-07-24 NOTE — Telephone Encounter (Signed)
Spoke w/ [name], informed her to have Pt try OTC Gas-x, Rolaids, or TUMS. She informed me that Pt has tried those and wanted to know if a prescription could be sent. Informed her that I would send to PCP for further advice.

## 2016-07-25 ENCOUNTER — Encounter: Payer: Self-pay | Admitting: Internal Medicine

## 2016-08-03 ENCOUNTER — Encounter: Payer: Self-pay | Admitting: Cardiology

## 2016-08-15 ENCOUNTER — Encounter (INDEPENDENT_AMBULATORY_CARE_PROVIDER_SITE_OTHER): Payer: Medicare Other | Admitting: Ophthalmology

## 2016-08-15 DIAGNOSIS — H43813 Vitreous degeneration, bilateral: Secondary | ICD-10-CM | POA: Diagnosis not present

## 2016-08-15 DIAGNOSIS — I10 Essential (primary) hypertension: Secondary | ICD-10-CM

## 2016-08-15 DIAGNOSIS — H353231 Exudative age-related macular degeneration, bilateral, with active choroidal neovascularization: Secondary | ICD-10-CM

## 2016-08-15 DIAGNOSIS — H35033 Hypertensive retinopathy, bilateral: Secondary | ICD-10-CM | POA: Diagnosis not present

## 2016-08-16 ENCOUNTER — Encounter (INDEPENDENT_AMBULATORY_CARE_PROVIDER_SITE_OTHER): Payer: Medicare Other | Admitting: Ophthalmology

## 2016-08-17 ENCOUNTER — Ambulatory Visit (INDEPENDENT_AMBULATORY_CARE_PROVIDER_SITE_OTHER): Payer: Medicare Other | Admitting: Cardiology

## 2016-08-17 ENCOUNTER — Encounter: Payer: Self-pay | Admitting: Cardiology

## 2016-08-17 ENCOUNTER — Encounter (INDEPENDENT_AMBULATORY_CARE_PROVIDER_SITE_OTHER): Payer: Medicare Other | Admitting: Ophthalmology

## 2016-08-17 VITALS — BP 122/62 | HR 54 | Ht 68.0 in | Wt 158.0 lb

## 2016-08-17 DIAGNOSIS — I25810 Atherosclerosis of coronary artery bypass graft(s) without angina pectoris: Secondary | ICD-10-CM | POA: Diagnosis not present

## 2016-08-17 DIAGNOSIS — I951 Orthostatic hypotension: Secondary | ICD-10-CM | POA: Diagnosis not present

## 2016-08-17 DIAGNOSIS — I5022 Chronic systolic (congestive) heart failure: Secondary | ICD-10-CM | POA: Diagnosis not present

## 2016-08-17 DIAGNOSIS — E785 Hyperlipidemia, unspecified: Secondary | ICD-10-CM

## 2016-08-17 DIAGNOSIS — I10 Essential (primary) hypertension: Secondary | ICD-10-CM

## 2016-08-17 MED ORDER — AMLODIPINE BESYLATE 2.5 MG PO TABS: 2.5000 mg | ORAL_TABLET | Freq: Every day | ORAL | 3 refills | Status: DC

## 2016-08-17 NOTE — Patient Instructions (Signed)
Medication Instructions:  1) DISCONTINUE Amlodipine 5mg   2) START Amlodipine 2.5mg  once daily  Labwork: None  Testing/Procedures: None  Follow-Up: Your physician wants you to follow-up in: 6 months with Dr. Meda Coffee. You will receive a reminder letter in the mail two months in advance. If you don't receive a letter, please call our office to schedule the follow-up appointment.   Any Other Special Instructions Will Be Listed Below (If Applicable).     If you need a refill on your cardiac medications before your next appointment, please call your pharmacy.

## 2016-08-17 NOTE — Progress Notes (Signed)
Patient ID: Christopher Hogan, male   DOB: [birth year], 82 y.o.   MRN: [id number]    Patient Name: Christopher Hogan Date of Encounter: 08/17/2016  Primary Care Provider:  [name], MD Primary Cardiologist:  Ena Dawley (prior Dr Verl Blalock)  Problem List   Past Medical History:  Diagnosis Date  . AAA (abdominal aortic aneurysm) ([place])    repaired 02/2002, renal ultrasound, Sept 2010: normal abdominal aorta, normal renal arteries   . Allergic rhinitis   . CAD (coronary artery disease)    stable stress test 7/11  . CHF (congestive heart failure) ([place])   . ED (erectile dysfunction)   . GERD (gastroesophageal reflux disease)   . Hyperlipidemia   . Hypertension   . Myocardial infarction ([place]) 1989   interior wall infarction   Past Surgical History:  Procedure Laterality Date  . AAA repair  2003  . CHOLECYSTECTOMY    . INGUINAL HERNIA REPAIR Right 1985  . PTCA  1989, 1992   Allergies  Allergies  Allergen Reactions  . Ace Inhibitors     REACTION: cough  . Spironolactone     REACTION: rash   HPI  Christopher Hogan is an 82 year old gentleman was being followed for coronary artery disease,myocardial infarction in 1989 history of hypertension, and history of hyperlipidemia.  He's doing well with no complaints today. He specifically denies any angina, chest pain, palpitations, presyncope, orthopnea, PND or edema. However his wife states that he hasn't been very active in couple nights ago he woke up chest pain that felt like heartburn. It took several hours before it subsided. He also describes dizziness when he stands up too fast. Otherwise no palpitations or syncope. No orthopnea or paroxysmal nocturnal dyspnea.  08/17/2016 - the patient is coming after 6 months, he underwent Lexiscan nuclear stress test in February 2017 that showed large scar in the inferior and inferolateral walls with decreased LV EF of 33% but no ischemia. Today he states that he's stay somewhat active he can walk to his  backyard help out with some work and denies any chest pain or dyspnea. His no lower extremity edema palpitations or syncope. He complains of dizziness when he stands up. He has been compliant with his meds that he gets from [place].  Home Medications  Prior to Admission medications   Medication Sig Start Date End Date Taking? Authorizing Provider  amitriptyline (ELAVIL) 10 MG tablet Take 10 mg by mouth at bedtime as needed. 12/31/11  Yes Colon Branch, MD  amLODipine (NORVASC) 10 MG tablet Take 10 mg by mouth daily.     Yes Historical Provider, MD  aspirin 81 MG chewable tablet Chew 162 mg by mouth daily.    Yes Historical Provider, MD  atenolol (TENORMIN) 50 MG tablet TAKE ONE TABLET BY MOUTH EVERY DAY 11/26/13  Yes Dorothy Spark, MD  clotrimazole-betamethasone (LOTRISONE) cream USE AS DIRECTED. IF RASH PERSISTS NEEDS APPOINTMENT 07/07/13  Yes Colon Branch, MD  Dorzolamide HCl-Timolol Mal PF 22.3-6.8 MG/ML SOLN Apply 1 drop to eye 2 (two) times daily.   Yes Historical Provider, MD  fish oil-omega-3 fatty acids 1000 MG capsule Take 1 g by mouth daily.    Yes Historical Provider, MD  furosemide (LASIX) 20 MG tablet TAKE ONE TABLET BY MOUTH ONCE DAILY 11/23/13  Yes Colon Branch, MD  gemfibrozil (LOPID) 600 MG tablet TAKE ONE TABLET BY MOUTH EVERY DAY 04/22/13  Yes Colon Branch, MD  latanoprost (XALATAN) 0.005 % ophthalmic solution Place 1  drop into both eyes at bedtime.   Yes Historical Provider, MD  loratadine (CLARITIN) 10 MG tablet Take 10 mg by mouth daily.   Yes Historical Provider, MD  Multiple Vitamin (MULTIVITAMIN) capsule Take 1 capsule by mouth daily.     Yes Historical Provider, MD  Multiple Vitamins-Minerals (OCUVITE PRESERVISION PO) Take by mouth.     Yes Historical Provider, MD  niacin (NIASPAN) 500 MG CR tablet Take 500 mg by mouth at bedtime.     Yes Historical Provider, MD  omeprazole (PRILOSEC) 20 MG capsule Take 20 mg by mouth daily.     Yes Historical Provider, MD  potassium chloride SA  (K-DUR,KLOR-CON) 20 MEQ tablet Take 20 mEq by mouth daily.     Yes Historical Provider, MD  simvastatin (ZOCOR) 10 MG tablet Take 1 tablet (10 mg total) by mouth at bedtime. 05/14/12  Yes Colon Branch, MD   Family History  Family History  Problem Relation Age of Onset  . Lung cancer Father   . CAD Other     mother?  . Prostate cancer Neg Hx   . Diabetes Neg Hx   . Colon cancer Neg Hx    Social History  Social History   Social History  . Marital status: Married    Spouse name: N/A  . Number of children: 3  . Years of education: N/A   Occupational History  . retired  Retired   Social History Main Topics  . Smoking status: Former Research scientist (life sciences)  . Smokeless tobacco: Never Used     Comment: quit in 1953  . Alcohol use No  . Drug use: Unknown  . Sexual activity: Not on file   Other Topics Concern  . Not on file   Social History Narrative   WWII veteran   Independent on his ADL     Lives w/ wife    Review of Systems, as per HPI, otherwise negative General:  No chills, fever, night sweats or weight changes.  Cardiovascular:  No chest pain, dyspnea on exertion, edema, orthopnea, palpitations, paroxysmal nocturnal dyspnea. Dermatological: No rash, lesions/masses Respiratory: No cough, dyspnea Urologic: No hematuria, dysuria Abdominal:   No nausea, vomiting, diarrhea, bright red blood per rectum, melena, or hematemesis Neurologic:  No visual changes, wkns, changes in mental status. All other systems reviewed and are otherwise negative except as noted above.  Physical Exam  Blood pressure 122/62, pulse (!) 54, height 5\' 8"  (1.727 m), weight 158 lb (71.7 kg).  General: Pleasant, NAD Psych: Normal affect. Neuro: Alert and oriented X 3. Moves all extremities spontaneously. HEENT: Normal  Neck: Supple without bruits or JVD. Lungs:  Resp regular and unlabored, CTA. Heart: RRR no s3, s4, or murmurs. Abdomen: Soft, non-tender, non-distended, BS + x 4.  Extremities: No clubbing,  cyanosis or edema. DP/PT/Radials 2+ and equal bilaterally.  Labs:  No results for input(s): CKTOTAL, CKMB, TROPONINI in the last 72 hours. Lab Results  Component Value Date   WBC 6.7 07/17/2016   HGB 14.5 07/17/2016   HCT 42.5 07/17/2016   MCV 94.9 07/17/2016   PLT 172.0 07/17/2016   No results for input(s): NA, K, CL, CO2, BUN, CREATININE, CALCIUM, PROT, BILITOT, ALKPHOS, ALT, AST, GLUCOSE in the last 168 hours.  Invalid input(s): LABALBU Lab Results  Component Value Date   CHOL 108 12/21/2015   HDL 31.20 (L) 12/21/2015   LDLCALC 47 12/21/2015   TRIG 147.0 12/21/2015   Accessory Clinical Findings  Echocardiogram - none in epic  ECG -  12/30/2015: sinus bradycardia, prior inferior infarct age undetermined, abnormal EKG, unchanged from prior in 2013.  Nuclear stress tet 12/2015  The left ventricular ejection fraction is moderately decreased (30-44%).  Nuclear stress EF: 33%. There is severe diffuse hypokinesis and inferolateral akinesis.  Defect 1: There is a medium fixed defect of moderate severity present in the basal anterolateral, mid anterolateral and apical lateral location. This is consistent with infarct. No ischemia noted.  Defect 2: There is a fixed medium defect of severe severity present in the basal inferior, basal inferolateral, mid inferior and mid inferolateral location. This is consistent with infarct. No ischemia noted.  Defect 3: There is a small fixed defect of mild severity present in the apical septal location. This is consistent with infarct. No ischemia noted.  This is a high risk study.  Findings consistent with prior myocardial infarction.      Assessment & Plan  A very pleasant 82 year old male formrly followed by Dr Verl Blalock  1. CAD, MI in 1989, negative stress test in 2011, in 2/17 he had an episode of chest pain with new EKG changes in the lateral leads. Lexiscan nuclear stress test showed a large scar in inferior inferolateral leads with LVEF  33% but no ischemia.  2. Hypertension , rather orthostatic hypotension I will decrease amlodipine to 2.5 mg daily.  3. Hyperlipidemia - at goal LDL and HDL, and TAG on gemfibrozil and simvastatin  4. Chronic systolic  CHF - based on exercise stress echo in 2008, the patient had inferior scar and LVEF previously 48%, now 33%. He appears euvolemic.  Follow up in 6 months.   Ena Dawley, MD, [facility] 08/17/2016, 9:26 AM

## 2016-09-20 DIAGNOSIS — Z961 Presence of intraocular lens: Secondary | ICD-10-CM | POA: Diagnosis not present

## 2016-09-20 DIAGNOSIS — H401113 Primary open-angle glaucoma, right eye, severe stage: Secondary | ICD-10-CM | POA: Diagnosis not present

## 2016-09-20 DIAGNOSIS — H401121 Primary open-angle glaucoma, left eye, mild stage: Secondary | ICD-10-CM | POA: Diagnosis not present

## 2016-09-20 DIAGNOSIS — H353123 Nonexudative age-related macular degeneration, left eye, advanced atrophic without subfoveal involvement: Secondary | ICD-10-CM | POA: Diagnosis not present

## 2016-09-20 DIAGNOSIS — H353211 Exudative age-related macular degeneration, right eye, with active choroidal neovascularization: Secondary | ICD-10-CM | POA: Diagnosis not present

## 2016-09-21 ENCOUNTER — Encounter (INDEPENDENT_AMBULATORY_CARE_PROVIDER_SITE_OTHER): Payer: Medicare Other | Admitting: Ophthalmology

## 2016-09-21 DIAGNOSIS — I10 Essential (primary) hypertension: Secondary | ICD-10-CM

## 2016-09-21 DIAGNOSIS — H353231 Exudative age-related macular degeneration, bilateral, with active choroidal neovascularization: Secondary | ICD-10-CM

## 2016-09-21 DIAGNOSIS — H43813 Vitreous degeneration, bilateral: Secondary | ICD-10-CM

## 2016-09-21 DIAGNOSIS — H35033 Hypertensive retinopathy, bilateral: Secondary | ICD-10-CM | POA: Diagnosis not present

## 2016-10-13 DIAGNOSIS — S63619A Unspecified sprain of unspecified finger, initial encounter: Secondary | ICD-10-CM | POA: Diagnosis not present

## 2016-10-13 DIAGNOSIS — M79641 Pain in right hand: Secondary | ICD-10-CM | POA: Diagnosis not present

## 2016-10-19 DIAGNOSIS — R1013 Epigastric pain: Secondary | ICD-10-CM | POA: Diagnosis not present

## 2016-10-19 DIAGNOSIS — S63609D Unspecified sprain of unspecified thumb, subsequent encounter: Secondary | ICD-10-CM | POA: Diagnosis not present

## 2016-10-19 DIAGNOSIS — M25532 Pain in left wrist: Secondary | ICD-10-CM | POA: Diagnosis not present

## 2016-10-29 ENCOUNTER — Telehealth: Payer: Self-pay | Admitting: Cardiology

## 2016-10-29 MED ORDER — AMLODIPINE BESYLATE 2.5 MG PO TABS: 2.5000 mg | ORAL_TABLET | Freq: Every day | ORAL | 3 refills | Status: DC

## 2016-10-29 NOTE — Telephone Encounter (Signed)
Pt is calling in requesting that his amlodipine 2.5 mg po daily [facility] attention to Dr Jory Sims [phone], for a 90 day supply.  Informed the pt that we will be glad to refill this and send this to location requested.  Informed the pt that Dr Meda Coffee will not be in the office until tomorrow to sign this.  Informed the pt that once she signs this script, I will fax this accordingly to the information provided.  Pt verbalized understanding and agrees with this plan.  Pt gracious for all the assistance provided.

## 2016-10-29 NOTE — Telephone Encounter (Signed)
Christopher Hogan is calling to find out if a new prescription for Amlodipine 2.5 mg  can be faxed  to Dr. Jory Sims at the [facility]  Fax#([phone])

## 2016-11-02 ENCOUNTER — Encounter (INDEPENDENT_AMBULATORY_CARE_PROVIDER_SITE_OTHER): Payer: Medicare Other | Admitting: Ophthalmology

## 2016-11-02 DIAGNOSIS — H43813 Vitreous degeneration, bilateral: Secondary | ICD-10-CM

## 2016-11-02 DIAGNOSIS — H35033 Hypertensive retinopathy, bilateral: Secondary | ICD-10-CM | POA: Diagnosis not present

## 2016-11-02 DIAGNOSIS — H353124 Nonexudative age-related macular degeneration, left eye, advanced atrophic with subfoveal involvement: Secondary | ICD-10-CM

## 2016-11-02 DIAGNOSIS — H353211 Exudative age-related macular degeneration, right eye, with active choroidal neovascularization: Secondary | ICD-10-CM | POA: Diagnosis not present

## 2016-11-02 DIAGNOSIS — I10 Essential (primary) hypertension: Secondary | ICD-10-CM | POA: Diagnosis not present

## 2016-11-21 DIAGNOSIS — R109 Unspecified abdominal pain: Secondary | ICD-10-CM | POA: Diagnosis not present

## 2016-11-21 DIAGNOSIS — R1013 Epigastric pain: Secondary | ICD-10-CM | POA: Diagnosis not present

## 2016-11-29 DIAGNOSIS — R109 Unspecified abdominal pain: Secondary | ICD-10-CM | POA: Diagnosis not present

## 2016-12-13 ENCOUNTER — Encounter (INDEPENDENT_AMBULATORY_CARE_PROVIDER_SITE_OTHER): Payer: Medicare Other | Admitting: Ophthalmology

## 2016-12-13 DIAGNOSIS — I10 Essential (primary) hypertension: Secondary | ICD-10-CM

## 2016-12-13 DIAGNOSIS — H35033 Hypertensive retinopathy, bilateral: Secondary | ICD-10-CM | POA: Diagnosis not present

## 2016-12-13 DIAGNOSIS — H353231 Exudative age-related macular degeneration, bilateral, with active choroidal neovascularization: Secondary | ICD-10-CM

## 2016-12-13 DIAGNOSIS — H43813 Vitreous degeneration, bilateral: Secondary | ICD-10-CM

## 2016-12-25 ENCOUNTER — Ambulatory Visit (INDEPENDENT_AMBULATORY_CARE_PROVIDER_SITE_OTHER): Payer: Medicare Other | Admitting: Internal Medicine

## 2016-12-25 ENCOUNTER — Encounter: Payer: Self-pay | Admitting: Internal Medicine

## 2016-12-25 VITALS — BP 122/70 | HR 71 | Temp 97.8°F | Resp 14 | Ht 68.0 in | Wt 162.0 lb

## 2016-12-25 DIAGNOSIS — R42 Dizziness and giddiness: Secondary | ICD-10-CM

## 2016-12-25 DIAGNOSIS — I10 Essential (primary) hypertension: Secondary | ICD-10-CM | POA: Diagnosis not present

## 2016-12-25 DIAGNOSIS — R946 Abnormal results of thyroid function studies: Secondary | ICD-10-CM

## 2016-12-25 DIAGNOSIS — E785 Hyperlipidemia, unspecified: Secondary | ICD-10-CM | POA: Diagnosis not present

## 2016-12-25 DIAGNOSIS — R7989 Other specified abnormal findings of blood chemistry: Secondary | ICD-10-CM

## 2016-12-25 LAB — LIPID PANEL
Cholesterol: 115 mg/dL (ref 0–200)
HDL: 32.2 mg/dL — ABNORMAL LOW (ref >39.00)
LDL Cholesterol: 51 mg/dL (ref 0–99)
NonHDL: 82.98
Total CHOL/HDL Ratio: 4
Triglycerides: 161 mg/dL — ABNORMAL HIGH (ref 0.0–149.0)
VLDL: 32.2 mg/dL (ref 0.0–40.0)

## 2016-12-25 LAB — AST: AST: 13 U/L (ref 0–37)

## 2016-12-25 LAB — ALT: ALT: 10 U/L (ref 0–53)

## 2016-12-25 LAB — TSH: TSH: 3.22 u[IU]/mL (ref 0.35–4.50)

## 2016-12-25 NOTE — Progress Notes (Signed)
Pre visit review using our clinic review tool, if applicable. No additional management support is needed unless otherwise documented below in the visit note. 

## 2016-12-25 NOTE — Assessment & Plan Note (Signed)
HTN: Well-controlled on Tenormin and amlodipine, last BMP satisfactory Hyperlipidemia: On simvastatin, not fasting but will go ahead and check a FLP, AST, ALT Dizziness: stable- chronic issue. Rx observation. Last TSH slightly elevated compared to previous values, check a TSH today White coating @ the  inner upper lip? Looks normal today. He has poor dentition, does not use dentures. Recommend to call me if symptoms resurface: Nystatin? RTC 6 months Recommend a Medicare wellness

## 2016-12-25 NOTE — Patient Instructions (Signed)
GO TO THE LAB : Get the blood work    GO TO THE FRONT DESK Schedule your next appointment for a  routine checkup with me in 6 months  Also, schedule Medicare wellness with one of our nurses at your convenience

## 2016-12-25 NOTE — Progress Notes (Signed)
Subjective:    Patient ID: Christopher Hogan, male    DOB: 11-19-1925, 83 y.o.   MRN: [id number]  DOS:  12/25/2016 Type of visit - description : Routine check up, here with his wife Interval history: In general feeling well. HTN: Good med compliance, ambulatory BPs within normal limits when checked Continue with dizziness, usually when he stands up, last few seconds. No associated nausea Several days ago, saw some white coating at the inner upper lip, symptoms resolved.  Review of Systems  No chest pain, difficulty breathing. No lower extremity edema No nausea, vomiting, abdominal pain No diplopia, slurred speech or motor deficits   Past Medical History:  Diagnosis Date  . AAA (abdominal aortic aneurysm) ([place])    repaired 02/2002, renal ultrasound, Sept 2010: normal abdominal aorta, normal renal arteries   . Allergic rhinitis   . CAD (coronary artery disease)    stable stress test 7/11  . CHF (congestive heart failure) ([place])   . ED (erectile dysfunction)   . GERD (gastroesophageal reflux disease)   . Hyperlipidemia   . Hypertension   . Myocardial infarction 1989   interior wall infarction    Past Surgical History:  Procedure Laterality Date  . AAA repair  2003  . CHOLECYSTECTOMY    . INGUINAL HERNIA REPAIR Right 1985  . PTCA  1989, 1992    Social History   Social History  . Marital status: Married    Spouse name: N/A  . Number of children: 3  . Years of education: N/A   Occupational History  . retired  Retired   Social History Main Topics  . Smoking status: Former Research scientist (life sciences)  . Smokeless tobacco: Never Used     Comment: quit in 1953  . Alcohol use No  . Drug use: Unknown  . Sexual activity: Not on file   Other Topics Concern  . Not on file   Social History Narrative   WWII veteran   Independent on his ADL     Lives w/ wife      Allergies as of 12/25/2016      Reactions   Ace Inhibitors    REACTION: cough   Spironolactone    REACTION: rash        Medication List       Accurate as of 12/25/16  5:39 PM. Always use your most recent med list.          amitriptyline 10 MG tablet Commonly known as:  ELAVIL Take 1 tablet (10 mg total) by mouth at bedtime as needed for sleep.   amLODipine 2.5 MG tablet Commonly known as:  NORVASC Take 1 tablet (2.5 mg total) by mouth daily.   aspirin 81 MG chewable tablet Chew 162 mg by mouth daily.   atenolol 50 MG tablet Commonly known as:  TENORMIN TAKE ONE TABLET BY MOUTH EVERY DAY   azelastine 0.1 % nasal spray Commonly known as:  ASTELIN Place 2 sprays into both nostrils at bedtime as needed for rhinitis. Use in each nostril as directed   CLARITIN 10 MG tablet Generic drug:  loratadine Take 10 mg by mouth daily.   Dorzolamide HCl-Timolol Mal PF 22.3-6.8 MG/ML Soln Apply 1 drop to eye 2 (two) times daily.   gemfibrozil 600 MG tablet Commonly known as:  LOPID TAKE ONE TABLET BY MOUTH EVERY DAY   ICAPS AREDS FORMULA PO Take 1 capsule by mouth daily.   latanoprost 0.005 % ophthalmic solution Commonly known as:  XALATAN Place 1  drop into both eyes at bedtime.   meclizine 25 MG tablet Commonly known as:  ANTIVERT Take 1 tablet (25 mg total) by mouth 2 (two) times daily as needed for dizziness.   multivitamin capsule Take 1 capsule by mouth daily.   omeprazole 20 MG capsule Commonly known as:  PRILOSEC Take 20 mg by mouth daily.   simvastatin 10 MG tablet Commonly known as:  ZOCOR Take 1 tablet (10 mg total) by mouth at bedtime.          Objective:   Physical Exam BP 122/70 (BP Location: Left Arm, Patient Position: Sitting, Cuff Size: Small)   Pulse 71   Temp 97.8 F (36.6 C) (Oral)   Resp 14   Ht 5\' 8"  (1.727 m)   Wt 162 lb (73.5 kg)   SpO2 92%   BMI 24.63 kg/m  General:   Well developed, well nourished . NAD.  HEENT:  Normocephalic . Face symmetric, atraumatic. Poor dentition, oral mucosa and gum w/o lesions, coating or  discharge. Lungs:  CTA  B Normal respiratory effort, no intercostal retractions, no accessory muscle use. Heart: RRR,  no murmur.  no pretibial edema bilaterally  Abdomen:  Not distended, soft, non-tender. No mass Skin: Not pale. Not jaundice Neurologic:  alert & oriented X3.  Speech normal, gait appropriate for age and unassisted Psych--  Cognition and judgment appear intact.  Cooperative with normal attention span and concentration.  Behavior appropriate. No anxious or depressed appearing.     Assessment & Plan:   Assessment HTN Hyperlipidemia GERD Insomnia-- elavil prn All rhinitis CV: --CAD, MI 1989, stress test (-) 2011 --CHF --lexiscan no ischemia  08-2016 --AAA, s/p repair 2003, [place] 2010 ok Opht: glaucoma, macular degeneration  [facility]   PLAN: HTN: Well-controlled on Tenormin and amlodipine, last BMP satisfactory Hyperlipidemia: On simvastatin, not fasting but will go ahead and check a FLP, AST, ALT Dizziness: stable- chronic issue. Rx observation. Last TSH slightly elevated compared to previous values, check a TSH today White coating @ the  inner upper lip? Looks normal today. He has poor dentition, does not use dentures. Recommend to call me if symptoms resurface: Nystatin? RTC 6 months Recommend a Medicare wellness

## 2017-01-02 DIAGNOSIS — R1013 Epigastric pain: Secondary | ICD-10-CM | POA: Diagnosis not present

## 2017-01-02 DIAGNOSIS — K5909 Other constipation: Secondary | ICD-10-CM | POA: Diagnosis not present

## 2017-01-22 ENCOUNTER — Encounter: Payer: Self-pay | Admitting: *Deleted

## 2017-01-22 ENCOUNTER — Ambulatory Visit (INDEPENDENT_AMBULATORY_CARE_PROVIDER_SITE_OTHER): Payer: Medicare Other | Admitting: *Deleted

## 2017-01-22 VITALS — BP 132/69 | HR 63 | Ht 68.0 in | Wt 160.8 lb

## 2017-01-22 DIAGNOSIS — Z0000 Encounter for general adult medical examination without abnormal findings: Secondary | ICD-10-CM | POA: Diagnosis not present

## 2017-01-22 NOTE — Progress Notes (Addendum)
Subjective:   Christopher Hogan is a 83 y.o. male who presents for Medicare Annual/Subsequent preventive examination. He is accompanied by his wife.  Review of Systems:  No ROS.  Medicare Wellness Visit.  Cardiac Risk Factors include: male gender;hypertension;advanced age (>43men, >45 women);dyslipidemia  Sleep patterns: no sleep issues, feels rested on waking, gets up 0-1 times nightly to void and sleeps 7 hours nightly.   Home Safety/Smoke Alarms:Feels safe in home. Smoke alarms in place.    Living environment; residence and Firearm Safety: Lives w/ wife. apartment, can live on one level, tub-shower. Seat Belt Safety/Bike Helmet: Wears seat belt.   Counseling:   Eye Exam- Follows w/ Dr. Katy Fitch and Dr. Zigmund Daniel for glaucoma and macular degeneration. Dental- Does not follow w/ dentist regularly. Poor dental hygiene. Has dentures upper and lower, but does not wear them often.  Male:   CCS- Aged out    PSA- Screening recommendations per PCP. Lab Results  Component Value Date   PSA 1.21 12/31/2011   PSA 1.16 03/17/2010   PSA 0.94 01/12/2009        Objective:    Vitals: BP 132/69 (BP Location: Right Arm, Patient Position: Sitting, Cuff Size: Normal)   Pulse 63   Ht 5\' 8"  (1.727 m)   Wt 160 lb 12.8 oz (72.9 kg)   SpO2 98%   BMI 24.45 kg/m   Body mass index is 24.45 kg/m.  Tobacco History  Smoking Status  . Former Smoker  Smokeless Tobacco  . Never Used    Comment: quit in [place] given: Not Answered   Past Medical History:  Diagnosis Date  . AAA (abdominal aortic aneurysm) ([employer])    repaired 02/2002, renal ultrasound, Sept 2010: normal abdominal aorta, normal renal arteries   . Allergic rhinitis   . CAD (coronary artery disease)    stable stress test 7/11  . CHF (congestive heart failure) ([place])   . ED (erectile dysfunction)   . GERD (gastroesophageal reflux disease)   . Hyperlipidemia   . Hypertension   . Myocardial infarction 1989   interior  wall infarction   Past Surgical History:  Procedure Laterality Date  . AAA repair  2003  . CHOLECYSTECTOMY    . INGUINAL HERNIA REPAIR Right 1985  . PTCA  1989, 1992   Family History  Problem Relation Age of Onset  . Lung cancer Father   . CAD Other     mother?  . Kidney Stones Son   . Prostate cancer Neg Hx   . Diabetes Neg Hx   . Colon cancer Neg Hx    History  Sexual Activity  . Sexual activity: Not on file    Outpatient Encounter Prescriptions as of 01/22/2017  Medication Sig  . amitriptyline (ELAVIL) 10 MG tablet Take 1 tablet (10 mg total) by mouth at bedtime as needed for sleep.  Marland Kitchen amLODipine (NORVASC) 2.5 MG tablet Take 1 tablet (2.5 mg total) by mouth daily.  Marland Kitchen aspirin 81 MG chewable tablet Chew 162 mg by mouth daily.   Marland Kitchen atenolol (TENORMIN) 50 MG tablet TAKE ONE TABLET BY MOUTH EVERY DAY  . azelastine (ASTELIN) 0.1 % nasal spray Place 2 sprays into both nostrils at bedtime as needed for rhinitis. Use in each nostril as directed  . Dorzolamide HCl-Timolol Mal PF 22.3-6.8 MG/ML SOLN Apply 1 drop to eye 2 (two) times daily.  Marland Kitchen gemfibrozil (LOPID) 600 MG tablet TAKE ONE TABLET BY MOUTH EVERY DAY  . latanoprost (  XALATAN) 0.005 % ophthalmic solution Place 1 drop into both eyes at bedtime.  Marland Kitchen loratadine (CLARITIN) 10 MG tablet Take 10 mg by mouth daily.  . meclizine (ANTIVERT) 25 MG tablet Take 1 tablet (25 mg total) by mouth 2 (two) times daily as needed for dizziness.  . Multiple Vitamin (MULTIVITAMIN) capsule Take 1 capsule by mouth daily.    . Multiple Vitamins-Minerals (ICAPS AREDS FORMULA PO) Take 1 capsule by mouth daily.  Marland Kitchen omeprazole (PRILOSEC) 20 MG capsule Take 20 mg by mouth daily.    . simvastatin (ZOCOR) 10 MG tablet Take 1 tablet (10 mg total) by mouth at bedtime.   No facility-administered encounter medications on file as of 01/22/2017.     Activities of Daily Living In your present state of health, do you have any difficulty performing the following  activities: 01/22/2017 07/17/2016  Hearing? Tempie Donning  Vision? N N  Difficulty concentrating or making decisions? N N  Walking or climbing stairs? N Y  Dressing or bathing? N N  Doing errands, shopping? N N  Preparing Food and eating ? N -  Using the Toilet? N -  In the past six months, have you accidently leaked urine? N -  Do you have problems with loss of bowel control? N -  Managing your Medications? N -  Managing your Finances? N -  Housekeeping or managing your Housekeeping? N -  Some recent data might be hidden    Patient Care Team: Colon Branch, MD as PCP - General Dorothy Spark, MD as Consulting Physician (Cardiology) Hayden Pedro, MD as Consulting Physician (Ophthalmology) Melissa Montane, MD as Consulting Physician (Otolaryngology) Debbra Riding, MD as Consulting Physician (Ophthalmology)   Assessment:    Physical assessment deferred to PCP.  Exercise Activities and Dietary recommendations Current Exercise Habits: Home exercise routine, Type of exercise: walking  Diet (meal preparation, eat out, water intake, caffeinated beverages, dairy products, fruits and vegetables): on average, 3 meals per day, high salt. Does not drink enough water.  Breakfast: varies-sausage/bacon and eggs Lunch: usually goes out for lunch, meals vary-chicken and dumplings, country steak, chicken pot pie, spaghetti, etc. Dinner: varies-potato salad, boiled potatoes, cabbage, turnip greens, beans, etc or may eat out ([employer], [employer])       Goals    . Increase physical activity    . Increase water intake      Fall Risk Fall Risk  01/22/2017 07/17/2016 12/21/2015 06/20/2015 01/22/2014  Falls in the past year? Yes No Yes No No  Number falls in past yr: 1 - 1 - -  Injury with Fall? Yes - No - -  Follow up - - Falls evaluation completed - -   Depression Screen PHQ 2/9 Scores 01/22/2017 07/17/2016 12/21/2015 06/20/2015  PHQ - 2 Score 0 0 0 0    Cognitive Function MMSE - Mini Mental State Exam 01/22/2017    Orientation to time 5  Orientation to Place 5  Registration 3  Attention/ Calculation 3  Recall 2  Language- name 2 objects 2  Language- repeat 1  Language- follow 3 step command 3  Language- read & follow direction 1  Write a sentence 1  Copy design 0  Total score 26        Immunization History  Administered Date(s) Administered  . Influenza Split 09/19/2011  . Influenza Whole 08/19/2000, 09/16/2008, 11/02/2009, 07/21/2010  . Influenza, High Dose Seasonal PF 07/25/2013, 10/21/2015, 07/17/2016  . Influenza,inj,Quad PF,36+ Mos 01/07/2015  . Pneumococcal Conjugate-13 10/21/2015  .  Pneumococcal Polysaccharide-23 08/19/2006, 01/06/2013  . Td 10/26/1999  . Tdap 12/31/2011  . Zoster 11/19/2014   Screening Tests Health Maintenance  Topic Date Due  . TETANUS/TDAP  12/30/2021  . INFLUENZA VACCINE  Completed  . PNA vac Low Risk Adult  Completed      Plan:   Follow-up w/ PCP as scheduled.  Bring a copy of your advance directives to your next office visit.  During the course of the visit the patient was educated and counseled about the following appropriate screening and preventive services:   Vaccines to include Pneumoccal, Influenza, Hepatitis B, Td, Zostavax, HCV  Cardiovascular Disease  Colorectal cancer screening  Diabetes screening  Prostate Cancer Screening  Glaucoma screening  Nutrition counseling   Patient Instructions (the written plan) was given to the patient.    Dorrene German, RN  01/22/2017  [name], MD

## 2017-01-22 NOTE — Progress Notes (Signed)
Pre visit review using our clinic review tool, if applicable. No additional management support is needed unless otherwise documented below in the visit note. 

## 2017-01-22 NOTE — Patient Instructions (Addendum)
Mr. Delmastro , Thank you for taking time to come for your Medicare Wellness Visit. I appreciate your ongoing commitment to your health goals. Please review the following plan we discussed and let me know if I can assist you in the future.   Bring a copy of your advance directives to your next office visit.  These are the goals we discussed: Goals    . Increase physical activity    . Increase water intake       This is a list of the screening recommended for you and due dates:  Health Maintenance  Topic Date Due  . Tetanus Vaccine  12/30/2021  . Flu Shot  Completed  . Pneumonia vaccines  Completed

## 2017-01-24 ENCOUNTER — Encounter (INDEPENDENT_AMBULATORY_CARE_PROVIDER_SITE_OTHER): Payer: Medicare Other | Admitting: Ophthalmology

## 2017-01-24 DIAGNOSIS — H35033 Hypertensive retinopathy, bilateral: Secondary | ICD-10-CM

## 2017-01-24 DIAGNOSIS — H353231 Exudative age-related macular degeneration, bilateral, with active choroidal neovascularization: Secondary | ICD-10-CM

## 2017-01-24 DIAGNOSIS — I10 Essential (primary) hypertension: Secondary | ICD-10-CM | POA: Diagnosis not present

## 2017-01-24 DIAGNOSIS — H43813 Vitreous degeneration, bilateral: Secondary | ICD-10-CM | POA: Diagnosis not present

## 2017-03-07 ENCOUNTER — Encounter (INDEPENDENT_AMBULATORY_CARE_PROVIDER_SITE_OTHER): Payer: Medicare Other | Admitting: Ophthalmology

## 2017-03-07 DIAGNOSIS — H353231 Exudative age-related macular degeneration, bilateral, with active choroidal neovascularization: Secondary | ICD-10-CM

## 2017-03-07 DIAGNOSIS — I10 Essential (primary) hypertension: Secondary | ICD-10-CM

## 2017-03-07 DIAGNOSIS — H35033 Hypertensive retinopathy, bilateral: Secondary | ICD-10-CM

## 2017-03-07 DIAGNOSIS — H43813 Vitreous degeneration, bilateral: Secondary | ICD-10-CM | POA: Diagnosis not present

## 2017-03-21 DIAGNOSIS — H401121 Primary open-angle glaucoma, left eye, mild stage: Secondary | ICD-10-CM | POA: Diagnosis not present

## 2017-03-21 DIAGNOSIS — H353210 Exudative age-related macular degeneration, right eye, stage unspecified: Secondary | ICD-10-CM | POA: Diagnosis not present

## 2017-03-21 DIAGNOSIS — H353122 Nonexudative age-related macular degeneration, left eye, intermediate dry stage: Secondary | ICD-10-CM | POA: Diagnosis not present

## 2017-03-21 DIAGNOSIS — H401113 Primary open-angle glaucoma, right eye, severe stage: Secondary | ICD-10-CM | POA: Diagnosis not present

## 2017-03-21 DIAGNOSIS — Z961 Presence of intraocular lens: Secondary | ICD-10-CM | POA: Diagnosis not present

## 2017-04-08 DIAGNOSIS — R109 Unspecified abdominal pain: Secondary | ICD-10-CM | POA: Diagnosis not present

## 2017-04-08 DIAGNOSIS — S20212A Contusion of left front wall of thorax, initial encounter: Secondary | ICD-10-CM | POA: Diagnosis not present

## 2017-04-11 ENCOUNTER — Encounter (INDEPENDENT_AMBULATORY_CARE_PROVIDER_SITE_OTHER): Payer: Medicare Other | Admitting: Ophthalmology

## 2017-04-11 DIAGNOSIS — H353231 Exudative age-related macular degeneration, bilateral, with active choroidal neovascularization: Secondary | ICD-10-CM

## 2017-04-11 DIAGNOSIS — H35033 Hypertensive retinopathy, bilateral: Secondary | ICD-10-CM | POA: Diagnosis not present

## 2017-04-11 DIAGNOSIS — I10 Essential (primary) hypertension: Secondary | ICD-10-CM | POA: Diagnosis not present

## 2017-04-11 DIAGNOSIS — H43813 Vitreous degeneration, bilateral: Secondary | ICD-10-CM | POA: Diagnosis not present

## 2017-05-16 ENCOUNTER — Encounter (INDEPENDENT_AMBULATORY_CARE_PROVIDER_SITE_OTHER): Payer: Medicare Other | Admitting: Ophthalmology

## 2017-05-16 DIAGNOSIS — H35033 Hypertensive retinopathy, bilateral: Secondary | ICD-10-CM | POA: Diagnosis not present

## 2017-05-16 DIAGNOSIS — H353231 Exudative age-related macular degeneration, bilateral, with active choroidal neovascularization: Secondary | ICD-10-CM

## 2017-05-16 DIAGNOSIS — I10 Essential (primary) hypertension: Secondary | ICD-10-CM

## 2017-05-16 DIAGNOSIS — H43813 Vitreous degeneration, bilateral: Secondary | ICD-10-CM

## 2017-06-11 ENCOUNTER — Telehealth: Payer: Self-pay | Admitting: Internal Medicine

## 2017-06-11 NOTE — Telephone Encounter (Signed)
Called pt to cx awv scheduled for 06/28/2017. Pt completed awv in March 2018. Spoke with pt regarding awv. Per pt okay to cx awv with Hoyle Sauer on 06/28/2017. Removed hold from Carloyn's schedule on 06/28/2017. SF

## 2017-06-13 ENCOUNTER — Encounter (INDEPENDENT_AMBULATORY_CARE_PROVIDER_SITE_OTHER): Payer: Medicare Other | Admitting: Ophthalmology

## 2017-06-13 DIAGNOSIS — H43813 Vitreous degeneration, bilateral: Secondary | ICD-10-CM | POA: Diagnosis not present

## 2017-06-13 DIAGNOSIS — H353211 Exudative age-related macular degeneration, right eye, with active choroidal neovascularization: Secondary | ICD-10-CM | POA: Diagnosis not present

## 2017-06-13 DIAGNOSIS — H353124 Nonexudative age-related macular degeneration, left eye, advanced atrophic with subfoveal involvement: Secondary | ICD-10-CM

## 2017-06-13 DIAGNOSIS — I10 Essential (primary) hypertension: Secondary | ICD-10-CM | POA: Diagnosis not present

## 2017-06-13 DIAGNOSIS — H35033 Hypertensive retinopathy, bilateral: Secondary | ICD-10-CM | POA: Diagnosis not present

## 2017-06-20 LAB — CBC AND DIFFERENTIAL
HCT: 41 (ref 41–53)
Hemoglobin: 13.9 (ref 13.5–17.5)
Neutrophils Absolute: 4
Platelets: 140 — AB (ref 150–399)
WBC: 6.5

## 2017-06-20 LAB — BASIC METABOLIC PANEL
BUN: 10 (ref 4–21)
Creatinine: 1 (ref 0.6–1.3)
Glucose: 82
Potassium: 3.9 (ref 3.4–5.3)
Sodium: 140 (ref 137–147)

## 2017-06-28 ENCOUNTER — Encounter: Payer: Self-pay | Admitting: Internal Medicine

## 2017-06-28 ENCOUNTER — Ambulatory Visit (INDEPENDENT_AMBULATORY_CARE_PROVIDER_SITE_OTHER): Payer: Medicare Other | Admitting: Internal Medicine

## 2017-06-28 ENCOUNTER — Ambulatory Visit: Payer: Medicare Other | Admitting: Internal Medicine

## 2017-06-28 VITALS — BP 126/68 | HR 59 | Temp 98.3°F | Resp 14 | Ht 68.0 in | Wt 154.1 lb

## 2017-06-28 DIAGNOSIS — E785 Hyperlipidemia, unspecified: Secondary | ICD-10-CM

## 2017-06-28 DIAGNOSIS — I10 Essential (primary) hypertension: Secondary | ICD-10-CM | POA: Diagnosis not present

## 2017-06-28 DIAGNOSIS — J310 Chronic rhinitis: Secondary | ICD-10-CM | POA: Diagnosis not present

## 2017-06-28 MED ORDER — AZELASTINE HCL 0.1 % NA SOLN: 2.0000 | Freq: Two times a day (BID) | NASAL | 3 refills | Status: DC

## 2017-06-28 NOTE — Patient Instructions (Signed)
Next visit in 6 months, please make an appointment.  For  allergies: Increase Astelin to 2 puffs twice a day  Over-the-counter Flonase 2 puffs on the side of the nose every day  Call if the hoarseness increases.

## 2017-06-28 NOTE — Progress Notes (Signed)
Subjective:    Patient ID: Christopher Hogan, male    DOB: 1925/07/10, 83 y.o.   MRN: [id number]  DOS:  06/28/2017 Type of visit - description : rov Interval history: No major concerns Had diarrhea a few weeks ago, got dehydrated, felt weak. Symptoms are largely resolved. Also had some dizziness, better after cerumen was removed. HTN: Good compliance of medication. Allergies continue to be an issue: Postnasal dripping, cough, hoarseness on and off. On Astelin once daily.    Review of Systems  No chest pain or difficulty breathing. No lower extremity edema. No blood in the stools or in the urine  Past Medical History:  Diagnosis Date  . AAA (abdominal aortic aneurysm) ([place])    repaired 02/2002, renal ultrasound, Sept 2010: normal abdominal aorta, normal renal arteries   . Allergic rhinitis   . CAD (coronary artery disease)    stable stress test 7/11  . CHF (congestive heart failure) ([place])   . ED (erectile dysfunction)   . GERD (gastroesophageal reflux disease)   . Hyperlipidemia   . Hypertension   . Myocardial infarction ([place]) 1989   interior wall infarction    Past Surgical History:  Procedure Laterality Date  . AAA repair  2003  . CHOLECYSTECTOMY    . INGUINAL HERNIA REPAIR Right 1985  . PTCA  1989, 1992    Social History   Social History  . Marital status: Married    Spouse name: N/A  . Number of children: 3  . Years of education: N/A   Occupational History  . retired  Retired   Social History Main Topics  . Smoking status: Former Research scientist (life sciences)  . Smokeless tobacco: Never Used     Comment: quit in 1953  . Alcohol use No  . Drug use: No  . Sexual activity: Not on file   Other Topics Concern  . Not on file   Social History Narrative   WWII veteran   Independent on his ADL     Lives w/ wife      Allergies as of 06/28/2017      Reactions   Ace Inhibitors    REACTION: cough   Spironolactone Rash      Medication List       Accurate as of 06/28/17  11:59 PM. Always use your most recent med list.          amitriptyline 10 MG tablet Commonly known as:  ELAVIL Take 1 tablet (10 mg total) by mouth at bedtime as needed for sleep.   amLODipine 2.5 MG tablet Commonly known as:  NORVASC Take 1 tablet (2.5 mg total) by mouth daily.   aspirin 81 MG chewable tablet Chew 162 mg by mouth daily.   atenolol 50 MG tablet Commonly known as:  TENORMIN TAKE ONE TABLET BY MOUTH EVERY DAY   azelastine 0.1 % nasal spray Commonly known as:  ASTELIN Place 2 sprays into both nostrils 2 (two) times daily. Use in each nostril as directed   CLARITIN 10 MG tablet Generic drug:  loratadine Take 10 mg by mouth daily.   dorzolamidel-timolol 22.3-6.8 MG/ML Soln ophthalmic solution Commonly known as:  COSOPT Apply 1 drop to eye 2 (two) times daily.   gemfibrozil 600 MG tablet Commonly known as:  LOPID TAKE ONE TABLET BY MOUTH EVERY DAY   ICAPS AREDS FORMULA PO Take 1 capsule by mouth daily.   latanoprost 0.005 % ophthalmic solution Commonly known as:  XALATAN Place 1 drop into both eyes at  bedtime.   meclizine 25 MG tablet Commonly known as:  ANTIVERT Take 1 tablet (25 mg total) by mouth 2 (two) times daily as needed for dizziness.   multivitamin capsule Take 1 capsule by mouth daily.   omeprazole 20 MG capsule Commonly known as:  PRILOSEC Take 20 mg by mouth daily.   simvastatin 10 MG tablet Commonly known as:  ZOCOR Take 1 tablet (10 mg total) by mouth at bedtime.          Objective:   Physical Exam BP 126/68 (BP Location: Left Arm, Patient Position: Sitting, Cuff Size: Small)   Pulse (!) 59   Temp 98.3 F (36.8 C) (Oral)   Resp 14   Ht 5\' 8"  (1.727 m)   Wt 154 lb 2 oz (69.9 kg)   SpO2 97%   BMI 23.43 kg/m  General:   Well developed, well nourished . NAD.  HEENT:  Normocephalic . Face symmetric, atraumatic. Nose is slightly congested, TMs normal, minimal amount of wax bilaterally. Throat: Symmetric, no lesion, has  only a few teeth but no obvious infection. Neck: No masses or LAD's, supraclavicular area without mass. Lungs:  CTA B Normal respiratory effort, no intercostal retractions, no accessory muscle use. Heart: RRR,  no murmur.  No pretibial edema bilaterally  Skin: Not pale. Not jaundice Neurologic:  alert & oriented X3.  Speech normal, gait appropriate for age and unassisted Psych--  Cognition and judgment appear intact.  Cooperative with normal attention span and concentration.  Behavior appropriate. No anxious or depressed appearing.      Assessment & Plan:    Assessment HTN Hyperlipidemia GERD Insomnia-- elavil prn All rhinitis CV: --CAD, MI 1989, stress test (-) 2011 --CHF --lexiscan no ischemia  08-2016 --AAA, s/p repair 2003, [place] 2010 ok Opht: glaucoma, macular degeneration  [facility]   PLAN: Labs at the [facility] 06/20/2017:  CBC normal with platelet of 140, slightly lower than usual. Potassium 3.9, creatinine 1.0. HTN: Continue amlodipine, Tenormin. Last BMP satisfactory Hyperlipidemia: Continue simvastatin. Last FLP satisfactory. Mild thrombocytopenia: No bleeding per ROS,recheck in few months Allergies: Reports postnasal dripping cough on on and off hoarseness. He has not smoked since age 33, exam is reassuring. Ddx: allergic vs vasomotor rhinitis. Increase Astelin to 2 puffs twice a day, at [facility], reassess on RTC. Consider ENT referral. RTC 6 months

## 2017-06-28 NOTE — Progress Notes (Signed)
Pre visit review using our clinic review tool, if applicable. No additional management support is needed unless otherwise documented below in the visit note. 

## 2017-06-29 NOTE — Assessment & Plan Note (Signed)
Labs at the [facility] 06/20/2017:  CBC normal with platelet of 140, slightly lower than usual. Potassium 3.9, creatinine 1.0. HTN: Continue amlodipine, Tenormin. Last BMP satisfactory Hyperlipidemia: Continue simvastatin. Last FLP satisfactory. Mild thrombocytopenia: No bleeding per ROS,recheck in few months Allergies: Reports postnasal dripping cough on on and off hoarseness. He has not smoked since age 83, exam is reassuring. Ddx: allergic vs vasomotor rhinitis. Increase Astelin to 2 puffs twice a day, at [facility], reassess on RTC. Consider ENT referral. RTC 6 months

## 2017-07-09 ENCOUNTER — Encounter (INDEPENDENT_AMBULATORY_CARE_PROVIDER_SITE_OTHER): Payer: Medicare Other | Admitting: Ophthalmology

## 2017-07-09 DIAGNOSIS — I10 Essential (primary) hypertension: Secondary | ICD-10-CM | POA: Diagnosis not present

## 2017-07-09 DIAGNOSIS — H353211 Exudative age-related macular degeneration, right eye, with active choroidal neovascularization: Secondary | ICD-10-CM

## 2017-07-09 DIAGNOSIS — H35033 Hypertensive retinopathy, bilateral: Secondary | ICD-10-CM | POA: Diagnosis not present

## 2017-07-09 DIAGNOSIS — H353124 Nonexudative age-related macular degeneration, left eye, advanced atrophic with subfoveal involvement: Secondary | ICD-10-CM

## 2017-07-09 DIAGNOSIS — H43813 Vitreous degeneration, bilateral: Secondary | ICD-10-CM | POA: Diagnosis not present

## 2017-07-23 DIAGNOSIS — H353123 Nonexudative age-related macular degeneration, left eye, advanced atrophic without subfoveal involvement: Secondary | ICD-10-CM | POA: Diagnosis not present

## 2017-07-23 DIAGNOSIS — H04123 Dry eye syndrome of bilateral lacrimal glands: Secondary | ICD-10-CM | POA: Diagnosis not present

## 2017-07-23 DIAGNOSIS — Z961 Presence of intraocular lens: Secondary | ICD-10-CM | POA: Diagnosis not present

## 2017-07-23 DIAGNOSIS — H401113 Primary open-angle glaucoma, right eye, severe stage: Secondary | ICD-10-CM | POA: Diagnosis not present

## 2017-07-23 DIAGNOSIS — H401121 Primary open-angle glaucoma, left eye, mild stage: Secondary | ICD-10-CM | POA: Diagnosis not present

## 2017-07-23 DIAGNOSIS — H353211 Exudative age-related macular degeneration, right eye, with active choroidal neovascularization: Secondary | ICD-10-CM | POA: Diagnosis not present

## 2017-08-05 ENCOUNTER — Encounter (INDEPENDENT_AMBULATORY_CARE_PROVIDER_SITE_OTHER): Payer: Medicare Other | Admitting: Ophthalmology

## 2017-08-05 DIAGNOSIS — H353231 Exudative age-related macular degeneration, bilateral, with active choroidal neovascularization: Secondary | ICD-10-CM | POA: Diagnosis not present

## 2017-08-05 DIAGNOSIS — I10 Essential (primary) hypertension: Secondary | ICD-10-CM

## 2017-08-05 DIAGNOSIS — H43813 Vitreous degeneration, bilateral: Secondary | ICD-10-CM | POA: Diagnosis not present

## 2017-08-05 DIAGNOSIS — H35033 Hypertensive retinopathy, bilateral: Secondary | ICD-10-CM | POA: Diagnosis not present

## 2017-08-06 ENCOUNTER — Encounter (INDEPENDENT_AMBULATORY_CARE_PROVIDER_SITE_OTHER): Payer: Medicare Other | Admitting: Ophthalmology

## 2017-09-02 ENCOUNTER — Encounter (INDEPENDENT_AMBULATORY_CARE_PROVIDER_SITE_OTHER): Payer: Medicare Other | Admitting: Ophthalmology

## 2017-09-02 DIAGNOSIS — H35033 Hypertensive retinopathy, bilateral: Secondary | ICD-10-CM

## 2017-09-02 DIAGNOSIS — H43813 Vitreous degeneration, bilateral: Secondary | ICD-10-CM

## 2017-09-02 DIAGNOSIS — I110 Hypertensive heart disease with heart failure: Secondary | ICD-10-CM

## 2017-09-02 DIAGNOSIS — H353231 Exudative age-related macular degeneration, bilateral, with active choroidal neovascularization: Secondary | ICD-10-CM | POA: Diagnosis not present

## 2017-09-03 DIAGNOSIS — Z961 Presence of intraocular lens: Secondary | ICD-10-CM | POA: Diagnosis not present

## 2017-09-03 DIAGNOSIS — H401113 Primary open-angle glaucoma, right eye, severe stage: Secondary | ICD-10-CM | POA: Diagnosis not present

## 2017-09-03 DIAGNOSIS — H401121 Primary open-angle glaucoma, left eye, mild stage: Secondary | ICD-10-CM | POA: Diagnosis not present

## 2017-09-30 ENCOUNTER — Encounter (INDEPENDENT_AMBULATORY_CARE_PROVIDER_SITE_OTHER): Payer: Medicare Other | Admitting: Ophthalmology

## 2017-09-30 DIAGNOSIS — H353231 Exudative age-related macular degeneration, bilateral, with active choroidal neovascularization: Secondary | ICD-10-CM

## 2017-09-30 DIAGNOSIS — H43813 Vitreous degeneration, bilateral: Secondary | ICD-10-CM

## 2017-09-30 DIAGNOSIS — H35033 Hypertensive retinopathy, bilateral: Secondary | ICD-10-CM | POA: Diagnosis not present

## 2017-09-30 DIAGNOSIS — I10 Essential (primary) hypertension: Secondary | ICD-10-CM | POA: Diagnosis not present

## 2017-10-28 ENCOUNTER — Encounter (INDEPENDENT_AMBULATORY_CARE_PROVIDER_SITE_OTHER): Payer: Medicare Other | Admitting: Ophthalmology

## 2017-10-31 ENCOUNTER — Encounter (INDEPENDENT_AMBULATORY_CARE_PROVIDER_SITE_OTHER): Payer: Medicare Other | Admitting: Ophthalmology

## 2017-10-31 DIAGNOSIS — I10 Essential (primary) hypertension: Secondary | ICD-10-CM | POA: Diagnosis not present

## 2017-10-31 DIAGNOSIS — H35033 Hypertensive retinopathy, bilateral: Secondary | ICD-10-CM

## 2017-10-31 DIAGNOSIS — H353231 Exudative age-related macular degeneration, bilateral, with active choroidal neovascularization: Secondary | ICD-10-CM | POA: Diagnosis not present

## 2017-10-31 DIAGNOSIS — H43813 Vitreous degeneration, bilateral: Secondary | ICD-10-CM

## 2017-11-17 ENCOUNTER — Other Ambulatory Visit: Payer: Self-pay

## 2017-11-17 ENCOUNTER — Emergency Department (HOSPITAL_COMMUNITY)
Admission: EM | Admit: 2017-11-17 | Discharge: 2017-11-17 | Disposition: A | Discharge status: Home / Self Care | Payer: Medicare Other | Attending: Emergency Medicine | Admitting: Emergency Medicine

## 2017-11-17 ENCOUNTER — Emergency Department (HOSPITAL_COMMUNITY): Payer: Medicare Other

## 2017-11-17 DIAGNOSIS — I5022 Chronic systolic (congestive) heart failure: Secondary | ICD-10-CM | POA: Insufficient documentation

## 2017-11-17 DIAGNOSIS — R0789 Other chest pain: Secondary | ICD-10-CM | POA: Insufficient documentation

## 2017-11-17 DIAGNOSIS — Z87891 Personal history of nicotine dependence: Secondary | ICD-10-CM | POA: Diagnosis not present

## 2017-11-17 DIAGNOSIS — I2510 Atherosclerotic heart disease of native coronary artery without angina pectoris: Secondary | ICD-10-CM | POA: Diagnosis not present

## 2017-11-17 DIAGNOSIS — I110 Hypertensive heart disease with heart failure: Secondary | ICD-10-CM | POA: Insufficient documentation

## 2017-11-17 DIAGNOSIS — Z7982 Long term (current) use of aspirin: Secondary | ICD-10-CM | POA: Diagnosis not present

## 2017-11-17 DIAGNOSIS — Z79899 Other long term (current) drug therapy: Secondary | ICD-10-CM | POA: Diagnosis not present

## 2017-11-17 DIAGNOSIS — R079 Chest pain, unspecified: Secondary | ICD-10-CM | POA: Diagnosis not present

## 2017-11-17 LAB — BASIC METABOLIC PANEL
Anion gap: 6 (ref 5–15)
BUN: 11 mg/dL (ref 6–20)
CO2: 28 mmol/L (ref 22–32)
Calcium: 8.6 mg/dL — ABNORMAL LOW (ref 8.9–10.3)
Chloride: 104 mmol/L (ref 101–111)
Creatinine, Ser: 1.08 mg/dL (ref 0.61–1.24)
GFR calc Af Amer: >60 mL/min (ref >60)
GFR calc non Af Amer: 58 mL/min — ABNORMAL LOW (ref >60)
Glucose, Bld: 124 mg/dL — ABNORMAL HIGH (ref 65–99)
Potassium: 3.8 mmol/L (ref 3.5–5.1)
Sodium: 138 mmol/L (ref 135–145)

## 2017-11-17 LAB — CBC
HCT: 43 % (ref 39.0–52.0)
Hemoglobin: 13.9 g/dL (ref 13.0–17.0)
MCH: 32.3 pg (ref 26.0–34.0)
MCHC: 32.3 g/dL (ref 30.0–36.0)
MCV: 99.8 fL (ref 78.0–100.0)
Platelets: 158 10*3/uL (ref 150–400)
RBC: 4.31 MIL/uL (ref 4.22–5.81)
RDW: 14 % (ref 11.5–15.5)
WBC: 7.5 10*3/uL (ref 4.0–10.5)

## 2017-11-17 LAB — I-STAT TROPONIN, ED: Troponin i, poc: 0.01 ng/mL (ref 0.00–0.08)

## 2017-11-17 NOTE — Discharge Instructions (Signed)
This pain may be musculoskeletal with certain movements making it worse. I do not think it is cardiac. You EKG is unchanged from one you had In September of last year done by Dr Meda Coffee. Your CXR doesn't show any concerning findings. Your blood work including cardiac enzymes are normal. If this pain continues to persist then I would follow-up with your PCP to discuss possible stress testing.

## 2017-11-17 NOTE — ED Notes (Signed)
Pt is alert and oriented x 4 and is verbally responsive. Pt denies chest pain at this time.

## 2017-11-17 NOTE — ED Triage Notes (Addendum)
Intermittent left sided chest pain, non-radiating that started x 2 days ago. Pt also reports that weeks ago he experienced left sided facial numbness. (+) SHOB with exertion, generalized weakness;  denies any n/v or  Dizziness. Hx of MI in 1987 per patient.

## 2017-11-17 NOTE — ED Provider Notes (Signed)
[facility] DEPT Provider Note   CSN: [id number] Arrival date & time: 11/17/17  8786     History   Chief Complaint Chief Complaint  Patient presents with  . Chest Pain    HPI Christopher Hogan is a 83 y.o. male.  HPI   83 year old male with chest pain.  He describes pain along his left costal margin.  Onset about 2 weeks ago.  Pain is intermittent.  He notices with certain movements such as sitting up.  Describes the pain as sharp.  He has a past history of MI approximately 30 years ago.  He remembers pain being much more severe, feeling short of breath and been diaphoretic.  He has not had these symptoms recently.  No cough.  No fevers or chills.  No urinary complaints.  No unusual leg pain or swelling.  He has felt generally weak but this has been ongoing and not acutely worsened since the onset of this chest pain.  Past Medical History:  Diagnosis Date  . AAA (abdominal aortic aneurysm) ([place])    repaired 02/2002, renal ultrasound, Sept 2010: normal abdominal aorta, normal renal arteries   . Allergic rhinitis   . CAD (coronary artery disease)    stable stress test 7/11  . CHF (congestive heart failure) ([place])   . ED (erectile dysfunction)   . GERD (gastroesophageal reflux disease)   . Hyperlipidemia   . Hypertension   . Myocardial infarction [facility]) 1989   interior wall infarction    Patient Active Problem List   Diagnosis Date Noted  . PCP notes >>>>>>>>>>>>>>>>>>>> 07/17/2016  . Vertigo 06/20/2015  . Chronic systolic CHF (congestive heart failure) ([place]) 12/17/2013  . Eczema 07/09/2013  . Annual physical exam 12/31/2011  . Inguinal hernia 12/31/2011  . Glaucoma and macular degeneration 12/31/2011  . DEGENERATIVE JOINT DISEASE 03/17/2010  . GERD 07/21/2008  . Rhinitis 04/20/2008  . ERECTILE DYSFUNCTION 12/09/2007  . Coronary atherosclerosis 06/09/2007  . Hyperlipidemia 05/06/2007  . Essential hypertension 04/30/2007    Past Surgical  History:  Procedure Laterality Date  . AAA repair  2003  . CHOLECYSTECTOMY    . INGUINAL HERNIA REPAIR Right 1985  . PTCA  1989, 1992       Home Medications    Prior to Admission medications   Medication Sig Start Date End Date Taking? Authorizing Provider  amitriptyline (ELAVIL) 10 MG tablet Take 1 tablet (10 mg total) by mouth at bedtime as needed for sleep. 06/27/15  Yes Paz, Alda Berthold, MD  amLODipine (NORVASC) 2.5 MG tablet Take 1 tablet (2.5 mg total) by mouth daily. 10/29/16 11/17/17 Yes Dorothy Spark, MD  aspirin 81 MG chewable tablet Chew 162 mg by mouth daily.    Yes [provider]  atenolol (TENORMIN) 50 MG tablet TAKE ONE TABLET BY MOUTH EVERY DAY 12/17/13  Yes Dorothy Spark, MD  Dorzolamide HCl-Timolol Mal PF 22.3-6.8 MG/ML SOLN Apply 1 drop to eye 2 (two) times daily. bith eyes   Yes [provider]  gemfibrozil (LOPID) 600 MG tablet TAKE ONE TABLET BY MOUTH EVERY DAY 04/22/13  Yes Paz, Alda Berthold, MD  loratadine (CLARITIN) 10 MG tablet Take 10 mg by mouth daily.   Yes [provider]  meclizine (ANTIVERT) 25 MG tablet Take 1 tablet (25 mg total) by mouth 2 (two) times daily as needed for dizziness. 06/20/15  Yes Paz, Alda Berthold, MD  Multiple Vitamin (MULTIVITAMIN) capsule Take 1 capsule by mouth daily.  Yes [provider]  Multiple Vitamins-Minerals (ICAPS AREDS FORMULA PO) Take 1 capsule by mouth daily.   Yes [provider]  omeprazole (PRILOSEC) 20 MG capsule Take 20 mg by mouth daily.     Yes [provider]  simvastatin (ZOCOR) 10 MG tablet Take 1 tablet (10 mg total) by mouth at bedtime. 05/14/12  Yes Paz, Alda Berthold, MD  azelastine (ASTELIN) 0.1 % nasal spray Place 2 sprays into both nostrils 2 (two) times daily. Use in each nostril as directed 06/28/17   Colon Branch, MD    Family History Family History  Problem Relation Age of Onset  . Lung cancer Father   . CAD Other        mother?  . Kidney Stones Son   .  Prostate cancer Neg Hx   . Diabetes Neg Hx   . Colon cancer Neg Hx     Social History Social History   Tobacco Use  . Smoking status: Former Research scientist (life sciences)  . Smokeless tobacco: Never Used  . Tobacco comment: quit in 1953  Substance Use Topics  . Alcohol use: No  . Drug use: No     Allergies   Ace inhibitors; Aspirin; and Spironolactone   Review of Systems Review of Systems  All systems reviewed and negative, other than as noted in HPI.  Physical Exam Updated Vital Signs BP 137/90 (BP Location: Right Arm)   Pulse 90   Temp (!) 97.5 F (36.4 C) (Oral)   Resp 20   Ht 5\' 8"  (1.727 m)   Wt 76.7 kg (169 lb)   SpO2 97%   BMI 25.70 kg/m   Physical Exam  Constitutional: He appears well-developed and well-nourished. No distress.  HENT:  Head: Normocephalic and atraumatic.  Eyes: Conjunctivae are normal. Right eye exhibits no discharge. Left eye exhibits no discharge.  Neck: Neck supple.  Cardiovascular: Normal rate, regular rhythm and normal heart sounds. Exam reveals no gallop and no friction rub.  No murmur heard. Pulmonary/Chest: Effort normal and breath sounds normal. No respiratory distress.  Abdominal: Soft. He exhibits no distension. There is no tenderness.  Musculoskeletal: He exhibits no edema or tenderness.  Lower extremities symmetric as compared to each other. No calf tenderness. Negative Homan's. No palpable cords.   Neurological: He is alert.  Skin: Skin is warm and dry.  Psychiatric: He has a normal mood and affect. His behavior is normal. Thought content normal.  Nursing note and vitals reviewed.    ED Treatments / Results  Labs (all labs ordered are listed, but only abnormal results are displayed) Labs Reviewed  BASIC METABOLIC PANEL - Abnormal; Notable for the following components:      Result Value   Glucose, Bld 124 (*)    Calcium 8.6 (*)    GFR calc non Af Amer 58 (*)    All other components within normal limits  CBC  I-STAT TROPONIN, ED     EKG  EKG Interpretation  Date/Time:  Sunday November 17 2017 11:04:51 EST Ventricular Rate:  64 PR Interval:    QRS Duration: 120 QT Interval:  428 QTC Calculation: 442 R Axis:   26 Text Interpretation:  Sinus rhythm IVCD, consider atypical RBBB Inferolateral infarct, old No significant change since last tracing Confirmed by Virgel Manifold [phone]) on 11/17/2017 11:52:26 AM       Radiology Dg Chest 2 View  Result Date: 11/17/2017 CLINICAL DATA:  Left-sided chest pain EXAM: CHEST  2 VIEW COMPARISON:  03/30/2008 FINDINGS: Cardiac  shadow is mildly enlarged. Aortic calcifications are noted. The lungs are well aerated bilaterally. Bibasilar atelectatic changes are noted. No sizable effusion is seen. No bony abnormality is noted. IMPRESSION: Mild bibasilar atelectasis. Electronically Signed   By: Inez Catalina M.D.   On: 11/17/2017 10:18    Procedures Procedures (including critical care time)  Medications Ordered in ED Medications - No data to display   Initial Impression / Assessment and Plan / ED Course  I have reviewed the triage vital signs and the nursing notes.  Pertinent labs & imaging results that were available during my care of the patient were reviewed by me and considered in my medical decision making (see chart for details).  83 year old male with chest pain.  Sounds musculoskeletal with worsening with certain movements such as sitting up.  I cannot reproduce it on exam though.  Sounds overall atypical for ACS.  Doubt PE, dissection or other emergent process.  His EKG is unchanged from prior.  He has no increased work of breathing.  Chest x-ray without acute abnormality.  Labs unremarkable.  Final Clinical Impressions(s) / ED Diagnoses   Final diagnoses:  Chest pain, unspecified type    ED Discharge Orders    None       Virgel Manifold, MD 11/17/17 1237

## 2017-11-19 DIAGNOSIS — J189 Pneumonia, unspecified organism: Secondary | ICD-10-CM

## 2017-11-19 HISTORY — DX: Pneumonia, unspecified organism: J18.9

## 2017-11-25 ENCOUNTER — Ambulatory Visit: Payer: Self-pay

## 2017-11-25 NOTE — Telephone Encounter (Signed)
Patient called in with c/o "chest pain." He says "It only hurts when I cough or something like that to exert myself. It feels like if you have a mild cramp in your leg or something like that. I take Aleve and it helps. I went to the ED on 11/17/17 and they did all the tests and said it was not my heart, but muscular. I wanted to see about a muscle relaxer or something."  When asked where the pain is when he exerts himself, he says it is "left lower rib cage under my heart." He says there is no pain at rest. He says "it doesn't radiate anywhere, but it moves around his heart like a cramp, about a 2 on the pain scale." He denies nausea, sweating, acid reflux symptoms-says he takes medicine for that, denies shortness of breath. According to protocol, see PCP within 3 days, appointment made, care advice given, patient verbalized understanding.   Reason for Disposition . [1] Chest pain(s) lasting a few seconds AND [2] persists > 3 days  Answer Assessment - Initial Assessment Questions 1. LOCATION: "Where does it hurt?"       Left lower rib cage  2. RADIATION: "Does the pain go anywhere else?" (e.g., into neck, jaw, arms, back)     Moves up to left side around heart in that area 3. ONSET: "When did the chest pain begin?" (Minutes, hours or days)      11/17/17 went to the ED, they said it was muscular. The pain is still there when I exert myself-cough, sneeze. Aleve takes care of it. 4. PATTERN "Does the pain come and go, or has it been constant since it started?"  "Does it get worse with exertion?"      Comes and goes with exertion-feels like a cramp in leg or something like that 5. DURATION: "How long does it last" (e.g., seconds, minutes, hours)     Short time, once I sit down it eases off 6. SEVERITY: "How bad is the pain?"  (e.g., Scale 1-10; mild, moderate, or severe)    - MILD (1-3): doesn't interfere with normal activities     - MODERATE (4-7): interferes with normal activities or awakens from  sleep    - SEVERE (8-10): excruciating pain, unable to do any normal activities       2 7. CARDIAC RISK FACTORS: "Do you have any history of heart problems or risk factors for heart disease?" (e.g., prior heart attack, angina; high blood pressure, diabetes, being overweight, high cholesterol, smoking, or strong family history of heart disease)     Prior heart attack-1985, HTN 8. PULMONARY RISK FACTORS: "Do you have any history of lung disease?"  (e.g., blood clots in lung, asthma, emphysema, birth control pills)     No 9. CAUSE: "What do you think is causing the chest pain?"     Unsure 10. OTHER SYMPTOMS: "Do you have any other symptoms?" (e.g., dizziness, nausea, vomiting, sweating, fever, difficulty breathing, cough)       Denies 11. PREGNANCY: "Is there any chance you are pregnant?" "When was your last menstrual period?"       N/A  Protocols used: CHEST PAIN-A-AH

## 2017-11-26 ENCOUNTER — Ambulatory Visit (INDEPENDENT_AMBULATORY_CARE_PROVIDER_SITE_OTHER): Payer: Medicare Other | Admitting: Internal Medicine

## 2017-11-26 ENCOUNTER — Encounter: Payer: Self-pay | Admitting: Internal Medicine

## 2017-11-26 VITALS — BP 132/74 | HR 58 | Temp 98.0°F | Resp 14 | Ht 68.0 in | Wt 156.2 lb

## 2017-11-26 DIAGNOSIS — R079 Chest pain, unspecified: Secondary | ICD-10-CM | POA: Diagnosis not present

## 2017-11-26 DIAGNOSIS — E785 Hyperlipidemia, unspecified: Secondary | ICD-10-CM

## 2017-11-26 LAB — LIPID PANEL
Cholesterol: 124 mg/dL (ref 0–200)
HDL: 34.4 mg/dL — ABNORMAL LOW (ref >39.00)
LDL Cholesterol: 57 mg/dL (ref 0–99)
NonHDL: 89.7
Total CHOL/HDL Ratio: 4
Triglycerides: 165 mg/dL — ABNORMAL HIGH (ref 0.0–149.0)
VLDL: 33 mg/dL (ref 0.0–40.0)

## 2017-11-26 LAB — ALT: ALT: 11 U/L (ref 0–53)

## 2017-11-26 LAB — AST: AST: 14 U/L (ref 0–37)

## 2017-11-26 NOTE — Progress Notes (Signed)
Subjective:    Patient ID: Christopher Hogan, male    DOB: 26-Sep-1925, 84 y.o.   MRN: [id number]  DOS:  11/26/2017 Type of visit - description : ed f/u Interval history: Went to the ER 11/17/2017, left-sided chest pain, worse with certain movements.  No associated symptoms. Labs, chest x-ray and EKG okay except for slightly low calcium.  Pain felt to be MSK, released home. Reports the pain was at L anterior-lower chest, worse with sneezing or bending.  Symptoms gradually went away, asymptomatic for the last  24 hours.  Also, had episode of left facial numbness approximately 2 months ago, lasted 1 day, not associated with diplopia, headache, dizziness or difficulty with his a speech. Since then has a funny feeling around the lips (bilaterally).  Wonders if he had a stroke.  HTN: Good med compliance, ambulatory BPs okay.  BP Readings from Last 3 Encounters:  11/26/17 132/74  11/17/17 (!) 150/75  06/28/17 126/68   Wt Readings from Last 3 Encounters:  11/26/17 156 lb 4 oz (70.9 kg)  11/17/17 169 lb (76.7 kg)  06/28/17 154 lb 2 oz (69.9 kg)     Review of Systems Denies fever, chills.  No cough or difficulty breathing No nausea or vomiting Occasional heartburn treated with OTCs.  Past Medical History:  Diagnosis Date  . AAA (abdominal aortic aneurysm) ([place])    repaired 02/2002, renal ultrasound, Sept 2010: normal abdominal aorta, normal renal arteries   . Allergic rhinitis   . CAD (coronary artery disease)    stable stress test 7/11  . CHF (congestive heart failure) ([place])   . ED (erectile dysfunction)   . GERD (gastroesophageal reflux disease)   . Hyperlipidemia   . Hypertension   . Myocardial infarction ([place]) 1989   interior wall infarction    Past Surgical History:  Procedure Laterality Date  . AAA repair  2003  . CHOLECYSTECTOMY    . INGUINAL HERNIA REPAIR Right 1985  . PTCA  1989, 1992    Social History   Socioeconomic History  . Marital status: Married   Spouse name: Not on file  . Number of children: 3  . Years of education: Not on file  . Highest education level: Not on file  Social Needs  . Financial resource strain: Not on file  . Food insecurity - worry: Not on file  . Food insecurity - inability: Not on file  . Transportation needs - medical: Not on file  . Transportation needs - non-medical: Not on file  Occupational History  . Occupation: retired     Fish farm manager: RETIRED  Tobacco Use  . Smoking status: Former Research scientist (life sciences)  . Smokeless tobacco: Never Used  . Tobacco comment: quit in 1953  Substance and Sexual Activity  . Alcohol use: No  . Drug use: No  . Sexual activity: Not on file  Other Topics Concern  . Not on file  Social History Narrative   WWII veteran   Independent on his ADL     Lives w/ wife      Allergies as of 11/26/2017      Reactions   Ace Inhibitors    REACTION: cough   Aspirin    REACTION: nausea: GERD Full strength only   Spironolactone Rash      Medication List        Accurate as of 11/26/17  5:51 PM. Always use your most recent med list.          amitriptyline 10 MG tablet  Commonly known as:  ELAVIL Take 1 tablet (10 mg total) by mouth at bedtime as needed for sleep.   amLODipine 2.5 MG tablet Commonly known as:  NORVASC Take 1 tablet (2.5 mg total) by mouth daily.   aspirin 81 MG chewable tablet Chew 162 mg by mouth daily.   atenolol 50 MG tablet Commonly known as:  TENORMIN TAKE ONE TABLET BY MOUTH EVERY DAY   azelastine 0.1 % nasal spray Commonly known as:  ASTELIN Place 2 sprays into both nostrils 2 (two) times daily. Use in each nostril as directed   CLARITIN 10 MG tablet Generic drug:  loratadine Take 10 mg by mouth daily.   dorzolamidel-timolol 22.3-6.8 MG/ML Soln ophthalmic solution Commonly known as:  COSOPT Apply 1 drop to eye 2 (two) times daily. bith eyes   gemfibrozil 600 MG tablet Commonly known as:  LOPID TAKE ONE TABLET BY MOUTH EVERY DAY   ICAPS AREDS  FORMULA PO Take 1 capsule by mouth daily.   meclizine 25 MG tablet Commonly known as:  ANTIVERT Take 1 tablet (25 mg total) by mouth 2 (two) times daily as needed for dizziness.   multivitamin capsule Take 1 capsule by mouth daily.   omeprazole 20 MG capsule Commonly known as:  PRILOSEC Take 20 mg by mouth daily.   simvastatin 10 MG tablet Commonly known as:  ZOCOR Take 1 tablet (10 mg total) by mouth at bedtime.          Objective:   Physical Exam BP 132/74 (BP Location: Left Arm, Patient Position: Sitting, Cuff Size: Small)   Pulse (!) 58   Temp 98 F (36.7 C) (Oral)   Resp 14   Ht 5\' 8"  (1.727 m)   Wt 156 lb 4 oz (70.9 kg)   SpO2 96%   BMI 23.76 kg/m  General:   Well developed, well nourished . NAD.  HEENT:  Normocephalic . Face symmetric, atraumatic Neck: Normal carotid pulses, no bruit Lungs:  Velcro  type of crackles at both bases. Normal respiratory effort, no intercostal retractions, no accessory muscle use. Heart: RRR,  no murmur.  no pretibial edema bilaterally  Abdomen:  Not distended, soft, non-tender. No rebound or rigidity.  No bruit Skin: Not pale. Not jaundice Neurologic:  alert & oriented X3.  Speech normal, gait appropriate for age and unassisted.  Motor symmetric Psych--  Cognition and judgment appear intact.  Cooperative with normal attention span and concentration.  Behavior appropriate. No anxious or depressed appearing.     Assessment & Plan:   Assessment HTN Hyperlipidemia GERD Insomnia-- elavil prn All rhinitis CV: --CAD, MI 1989, stress test (-) 2011  --CHF --lexiscan no ischemia  08-2016 --AAA, s/p repair 2003, [place] 2010 ok Opht: glaucoma, macular degeneration  [facility]   PLAN: CP: Resolved, likely MSK, no further eval unless symptoms resurface Low calcium @ the ER: Check an ionized calcium TIA?  See HPI, 2 months ago had left facial numbness, we discussed possibly workup versus observation and CV RF  control.  We agreed on observation.  Continue aspirin.  ER if symptoms resurface High cholesterol: On simvastatin, check FLP, AST, ALT Velcro type of crackles at bases: No volume overload, no pneumonia on clinical grounds, likely has some degree of pulmonary fibrosis.  Patient is asx.  Observation RTC for a checkup in 3 months

## 2017-11-26 NOTE — Patient Instructions (Signed)
GO TO THE LAB : Get the blood work     GO TO THE FRONT DESK Schedule your next appointment for a checkup in 3 months  Call the office or go to the ER if your chest pain or facial numbness come back

## 2017-11-26 NOTE — Progress Notes (Signed)
Pre visit review using our clinic review tool, if applicable. No additional management support is needed unless otherwise documented below in the visit note. 

## 2017-11-26 NOTE — Assessment & Plan Note (Signed)
CP: Resolved, likely MSK, no further eval unless symptoms resurface Low calcium @ the ER: Check an ionized calcium TIA?  See HPI, 2 months ago had left facial numbness, we discussed possibly workup versus observation and CV RF control.  We agreed on observation.  Continue aspirin.  ER if symptoms resurface High cholesterol: On simvastatin, check FLP, AST, ALT Velcro type of crackles at bases: No volume overload, no pneumonia on clinical grounds, likely has some degree of pulmonary fibrosis.  Patient is asx.  Observation RTC for a checkup in 3 months

## 2017-11-27 LAB — CALCIUM, IONIZED: Calcium, Ion: 5 mg/dL (ref 4.8–5.6)

## 2017-11-28 ENCOUNTER — Encounter (INDEPENDENT_AMBULATORY_CARE_PROVIDER_SITE_OTHER): Payer: Medicare Other | Admitting: Ophthalmology

## 2017-11-28 DIAGNOSIS — H35033 Hypertensive retinopathy, bilateral: Secondary | ICD-10-CM

## 2017-11-28 DIAGNOSIS — H353231 Exudative age-related macular degeneration, bilateral, with active choroidal neovascularization: Secondary | ICD-10-CM | POA: Diagnosis not present

## 2017-11-28 DIAGNOSIS — I10 Essential (primary) hypertension: Secondary | ICD-10-CM

## 2017-11-28 DIAGNOSIS — H43813 Vitreous degeneration, bilateral: Secondary | ICD-10-CM | POA: Diagnosis not present

## 2017-12-26 ENCOUNTER — Encounter (INDEPENDENT_AMBULATORY_CARE_PROVIDER_SITE_OTHER): Payer: Medicare Other | Admitting: Ophthalmology

## 2017-12-26 DIAGNOSIS — H353231 Exudative age-related macular degeneration, bilateral, with active choroidal neovascularization: Secondary | ICD-10-CM

## 2017-12-26 DIAGNOSIS — H43813 Vitreous degeneration, bilateral: Secondary | ICD-10-CM

## 2017-12-26 DIAGNOSIS — I10 Essential (primary) hypertension: Secondary | ICD-10-CM

## 2017-12-26 DIAGNOSIS — H35033 Hypertensive retinopathy, bilateral: Secondary | ICD-10-CM | POA: Diagnosis not present

## 2018-01-03 ENCOUNTER — Ambulatory Visit: Payer: Medicare Other | Admitting: Internal Medicine

## 2018-01-23 ENCOUNTER — Encounter (INDEPENDENT_AMBULATORY_CARE_PROVIDER_SITE_OTHER): Payer: Medicare Other | Admitting: Ophthalmology

## 2018-01-23 DIAGNOSIS — I10 Essential (primary) hypertension: Secondary | ICD-10-CM | POA: Diagnosis not present

## 2018-01-23 DIAGNOSIS — H353231 Exudative age-related macular degeneration, bilateral, with active choroidal neovascularization: Secondary | ICD-10-CM | POA: Diagnosis not present

## 2018-01-23 DIAGNOSIS — H43813 Vitreous degeneration, bilateral: Secondary | ICD-10-CM

## 2018-01-23 DIAGNOSIS — H35033 Hypertensive retinopathy, bilateral: Secondary | ICD-10-CM

## 2018-01-31 DIAGNOSIS — J22 Unspecified acute lower respiratory infection: Secondary | ICD-10-CM | POA: Diagnosis not present

## 2018-01-31 DIAGNOSIS — R05 Cough: Secondary | ICD-10-CM | POA: Diagnosis not present

## 2018-02-14 DIAGNOSIS — H6123 Impacted cerumen, bilateral: Secondary | ICD-10-CM | POA: Diagnosis not present

## 2018-02-14 DIAGNOSIS — M25511 Pain in right shoulder: Secondary | ICD-10-CM | POA: Diagnosis not present

## 2018-02-19 DIAGNOSIS — M25511 Pain in right shoulder: Secondary | ICD-10-CM | POA: Diagnosis not present

## 2018-02-20 ENCOUNTER — Encounter (INDEPENDENT_AMBULATORY_CARE_PROVIDER_SITE_OTHER): Payer: Medicare Other | Admitting: Ophthalmology

## 2018-02-20 DIAGNOSIS — H43813 Vitreous degeneration, bilateral: Secondary | ICD-10-CM

## 2018-02-20 DIAGNOSIS — H35033 Hypertensive retinopathy, bilateral: Secondary | ICD-10-CM

## 2018-02-20 DIAGNOSIS — H353231 Exudative age-related macular degeneration, bilateral, with active choroidal neovascularization: Secondary | ICD-10-CM

## 2018-02-20 DIAGNOSIS — I10 Essential (primary) hypertension: Secondary | ICD-10-CM | POA: Diagnosis not present

## 2018-02-21 ENCOUNTER — Encounter: Payer: Self-pay | Admitting: Internal Medicine

## 2018-02-21 ENCOUNTER — Ambulatory Visit (HOSPITAL_BASED_OUTPATIENT_CLINIC_OR_DEPARTMENT_OTHER)
Admission: RE | Admit: 2018-02-21 | Discharge: 2018-02-21 | Disposition: A | Discharge status: Home / Self Care | Payer: Medicare Other | Source: Ambulatory Visit | Attending: Internal Medicine | Admitting: Internal Medicine

## 2018-02-21 ENCOUNTER — Ambulatory Visit (INDEPENDENT_AMBULATORY_CARE_PROVIDER_SITE_OTHER): Payer: Medicare Other | Admitting: Internal Medicine

## 2018-02-21 VITALS — BP 124/68 | HR 63 | Temp 98.0°F | Resp 14 | Ht 68.0 in | Wt 149.1 lb

## 2018-02-21 DIAGNOSIS — G4700 Insomnia, unspecified: Secondary | ICD-10-CM | POA: Diagnosis not present

## 2018-02-21 DIAGNOSIS — I700 Atherosclerosis of aorta: Secondary | ICD-10-CM | POA: Diagnosis not present

## 2018-02-21 DIAGNOSIS — J189 Pneumonia, unspecified organism: Secondary | ICD-10-CM

## 2018-02-21 DIAGNOSIS — Z8701 Personal history of pneumonia (recurrent): Secondary | ICD-10-CM | POA: Insufficient documentation

## 2018-02-21 DIAGNOSIS — I10 Essential (primary) hypertension: Secondary | ICD-10-CM | POA: Diagnosis not present

## 2018-02-21 DIAGNOSIS — Z09 Encounter for follow-up examination after completed treatment for conditions other than malignant neoplasm: Secondary | ICD-10-CM | POA: Insufficient documentation

## 2018-02-21 MED ORDER — AMITRIPTYLINE HCL 10 MG PO TABS: 10.0000 mg | ORAL_TABLET | Freq: Every evening | ORAL | 3 refills | Status: DC | PRN

## 2018-02-21 NOTE — Patient Instructions (Signed)
  GO TO THE FRONT DESK Schedule your next appointment for a checkup in 4 months       STOP BY THE FIRST FLOOR:  get the XR     For sleep: -OTC melatonin every night -Take amitriptyline 10 mg 1/2 tablet at night if needed.  Okay to take it every night if that is what you need. -Call if you cannot break the amitriptyline tablet, will prescribe something different -Call if all  above is not helping

## 2018-02-21 NOTE — Progress Notes (Signed)
Subjective:    Patient ID: Christopher Hogan, male    DOB: 04/13/1925, 84 y.o.   MRN: [id number]  DOS:  02/21/2018 Type of visit - description : rov Interval history: Since the last office visit, went to urgent care 2 weeks ago, DX with pneumonia, Rx antibiotics.  Overall feels better Insomnia: Takes Elavil 10 mg as needed, feels like it is too strong keep seeing hangover the next morning.  Alternatives?   Review of Systems  Did not have fever with a pneumonia, had cough and wheezing: That is much better.  Past Medical History:  Diagnosis Date  . AAA (abdominal aortic aneurysm) ([place])    repaired 02/2002, renal ultrasound, Sept 2010: normal abdominal aorta, normal renal arteries   . Allergic rhinitis   . CAD (coronary artery disease)    stable stress test 7/11  . CHF (congestive heart failure) ([place])   . ED (erectile dysfunction)   . GERD (gastroesophageal reflux disease)   . Hyperlipidemia   . Hypertension   . Myocardial infarction ([place]) 1989   interior wall infarction    Past Surgical History:  Procedure Laterality Date  . AAA repair  2003  . CHOLECYSTECTOMY    . INGUINAL HERNIA REPAIR Right 1985  . PTCA  1989, 1992    Social History   Socioeconomic History  . Marital status: Married    Spouse name: Not on file  . Number of children: 3  . Years of education: Not on file  . Highest education level: Not on file  Occupational History  . Occupation: retired     Fish farm manager: RETIRED  Social Needs  . Financial resource strain: Not on file  . Food insecurity:    Worry: Not on file    Inability: Not on file  . Transportation needs:    Medical: Not on file    Non-medical: Not on file  Tobacco Use  . Smoking status: Former Research scientist (life sciences)  . Smokeless tobacco: Never Used  . Tobacco comment: quit in 1953  Substance and Sexual Activity  . Alcohol use: No  . Drug use: No  . Sexual activity: Not on file  Lifestyle  . Physical activity:    Days per week: Not on file    Minutes  per session: Not on file  . Stress: Not on file  Relationships  . Social connections:    Talks on phone: Not on file    Gets together: Not on file    Attends religious service: Not on file    Active member of club or organization: Not on file    Attends meetings of clubs or organizations: Not on file    Relationship status: Not on file  . Intimate partner violence:    Fear of current or ex partner: Not on file    Emotionally abused: Not on file    Physically abused: Not on file    Forced sexual activity: Not on file  Other Topics Concern  . Not on file  Social History Narrative   WWII veteran   Independent on his ADL     Lives w/ wife      Allergies as of 02/21/2018      Reactions   Ace Inhibitors    REACTION: cough   Aspirin    REACTION: nausea: GERD Full strength only   Spironolactone Rash      Medication List        Accurate as of 02/21/18 11:59 PM. Always use your most recent  med list.          amitriptyline 10 MG tablet Commonly known as:  ELAVIL Take 1 tablet (10 mg total) by mouth at bedtime as needed for sleep.   amLODipine 2.5 MG tablet Commonly known as:  NORVASC Take 1 tablet (2.5 mg total) by mouth daily.   aspirin 81 MG chewable tablet Chew 162 mg by mouth daily.   atenolol 50 MG tablet Commonly known as:  TENORMIN TAKE ONE TABLET BY MOUTH EVERY DAY   azelastine 0.1 % nasal spray Commonly known as:  ASTELIN Place 2 sprays into both nostrils 2 (two) times daily. Use in each nostril as directed   CLARITIN 10 MG tablet Generic drug:  loratadine Take 10 mg by mouth daily.   dorzolamidel-timolol 22.3-6.8 MG/ML Soln ophthalmic solution Commonly known as:  COSOPT Apply 1 drop to eye 2 (two) times daily. bith eyes   gemfibrozil 600 MG tablet Commonly known as:  LOPID TAKE ONE TABLET BY MOUTH EVERY DAY   ICAPS AREDS FORMULA PO Take 1 capsule by mouth daily.   meclizine 25 MG tablet Commonly known as:  ANTIVERT Take 1 tablet (25 mg total)  by mouth 2 (two) times daily as needed for dizziness.   multivitamin capsule Take 1 capsule by mouth daily.   omeprazole 20 MG capsule Commonly known as:  PRILOSEC Take 20 mg by mouth daily.   simvastatin 10 MG tablet Commonly known as:  ZOCOR Take 1 tablet (10 mg total) by mouth at bedtime.          Objective:   Physical Exam BP 124/68 (BP Location: Left Arm, Patient Position: Sitting, Cuff Size: Small)   Pulse 63   Temp 98 F (36.7 C) (Oral)   Resp 14   Ht 5\' 8"  (1.727 m)   Wt 149 lb 2 oz (67.6 kg)   SpO2 97%   BMI 22.67 kg/m  General:   Well developed, well nourished . NAD.  HEENT:  Normocephalic . Face symmetric, atraumatic Lungs:  Velcro crackles at both bases, today they are more noticeable on the right base. Normal respiratory effort, no intercostal retractions, no accessory muscle use. Heart: RRR,  no murmur.  No pretibial edema bilaterally  Skin: Not pale. Not jaundice Neurologic:  alert & oriented X3.  Speech normal, gait appropriate for age and unassisted Psych--  Cognition and judgment appear intact.  Cooperative with normal attention span and concentration.  Behavior appropriate. No anxious or depressed appearing.      Assessment & Plan:    Assessment HTN Hyperlipidemia GERD Insomnia-- elavil prn All rhinitis CV: --CAD, MI 1989, stress test (-) 2011  --CHF --lexiscan no ischemia  08-2016 --AAA, s/p repair 2003, [place] 2010 ok Opht: glaucoma, macular degeneration  [facility]   PLAN: HTN: Seems controlled on amlodipine and Tenormin.  No change Pulmonary fibrosis?:  Has   Velcro crackles at bases (no new), no symptoms except when he is sick like a couple of weeks ago.  We are getting a chest x-ray and consider further eval. Pneumonia: Reports recent dx of PNM, S/B ABX, feeling better.  Checking a chest x-ray.  Was seen at the [facility] urgent care, no records. Insomnia : Amitriptyline 10 mg seems to be too strong, will decrease to  half tablet daily, if unable to break the tablet they will let me know, we will try something different.  Wife takes lorazepam, I will try to stay away from benzodiazepines if possible RTC 4 months

## 2018-02-21 NOTE — Progress Notes (Signed)
Pre visit review using our clinic review tool, if applicable. No additional management support is needed unless otherwise documented below in the visit note. 

## 2018-02-22 DIAGNOSIS — G4700 Insomnia, unspecified: Secondary | ICD-10-CM | POA: Insufficient documentation

## 2018-02-22 HISTORY — DX: Insomnia, unspecified: G47.00

## 2018-02-22 NOTE — Assessment & Plan Note (Signed)
HTN: Seems controlled on amlodipine and Tenormin.  No change Pulmonary fibrosis?:  Has   Velcro crackles at bases (no new), no symptoms except when he is sick like a couple of weeks ago.  We are getting a chest x-ray and consider further eval. Pneumonia: Reports recent dx of PNM, S/B ABX, feeling better.  Checking a chest x-ray.  Was seen at the [facility] urgent care, no records. Insomnia : Amitriptyline 10 mg seems to be too strong, will decrease to half tablet daily, if unable to break the tablet they will let me know, we will try something different.  Wife takes lorazepam, I will try to stay away from benzodiazepines if possible RTC 4 months

## 2018-03-04 DIAGNOSIS — Z961 Presence of intraocular lens: Secondary | ICD-10-CM | POA: Diagnosis not present

## 2018-03-04 DIAGNOSIS — H353211 Exudative age-related macular degeneration, right eye, with active choroidal neovascularization: Secondary | ICD-10-CM | POA: Diagnosis not present

## 2018-03-04 DIAGNOSIS — H353124 Nonexudative age-related macular degeneration, left eye, advanced atrophic with subfoveal involvement: Secondary | ICD-10-CM | POA: Diagnosis not present

## 2018-03-04 DIAGNOSIS — H04123 Dry eye syndrome of bilateral lacrimal glands: Secondary | ICD-10-CM | POA: Diagnosis not present

## 2018-03-04 DIAGNOSIS — H401113 Primary open-angle glaucoma, right eye, severe stage: Secondary | ICD-10-CM | POA: Diagnosis not present

## 2018-03-04 DIAGNOSIS — H401121 Primary open-angle glaucoma, left eye, mild stage: Secondary | ICD-10-CM | POA: Diagnosis not present

## 2018-03-20 ENCOUNTER — Encounter (INDEPENDENT_AMBULATORY_CARE_PROVIDER_SITE_OTHER): Payer: Medicare Other | Admitting: Ophthalmology

## 2018-03-20 DIAGNOSIS — H353231 Exudative age-related macular degeneration, bilateral, with active choroidal neovascularization: Secondary | ICD-10-CM

## 2018-03-20 DIAGNOSIS — H43813 Vitreous degeneration, bilateral: Secondary | ICD-10-CM | POA: Diagnosis not present

## 2018-03-20 DIAGNOSIS — I10 Essential (primary) hypertension: Secondary | ICD-10-CM | POA: Diagnosis not present

## 2018-03-20 DIAGNOSIS — H35033 Hypertensive retinopathy, bilateral: Secondary | ICD-10-CM

## 2018-04-17 ENCOUNTER — Encounter (INDEPENDENT_AMBULATORY_CARE_PROVIDER_SITE_OTHER): Payer: Medicare Other | Admitting: Ophthalmology

## 2018-04-17 DIAGNOSIS — H353231 Exudative age-related macular degeneration, bilateral, with active choroidal neovascularization: Secondary | ICD-10-CM | POA: Diagnosis not present

## 2018-04-17 DIAGNOSIS — H35033 Hypertensive retinopathy, bilateral: Secondary | ICD-10-CM | POA: Diagnosis not present

## 2018-04-17 DIAGNOSIS — I10 Essential (primary) hypertension: Secondary | ICD-10-CM | POA: Diagnosis not present

## 2018-04-17 DIAGNOSIS — H43813 Vitreous degeneration, bilateral: Secondary | ICD-10-CM

## 2018-05-15 ENCOUNTER — Telehealth: Payer: Self-pay

## 2018-05-15 ENCOUNTER — Encounter (INDEPENDENT_AMBULATORY_CARE_PROVIDER_SITE_OTHER): Payer: Medicare Other | Admitting: Ophthalmology

## 2018-05-15 DIAGNOSIS — I10 Essential (primary) hypertension: Secondary | ICD-10-CM

## 2018-05-15 DIAGNOSIS — H35033 Hypertensive retinopathy, bilateral: Secondary | ICD-10-CM

## 2018-05-15 DIAGNOSIS — H43813 Vitreous degeneration, bilateral: Secondary | ICD-10-CM | POA: Diagnosis not present

## 2018-05-15 DIAGNOSIS — H353231 Exudative age-related macular degeneration, bilateral, with active choroidal neovascularization: Secondary | ICD-10-CM | POA: Diagnosis not present

## 2018-05-15 NOTE — Telephone Encounter (Signed)
He has a history of a large hernia, if he having a good acute symptoms: Nausea, vomiting, severe pain: Needs to go to the ER or come here. If symptoms are  just mild, recommend to see his surgeon, Dr. Dalbert Batman if he saw him before.  Provide a referral if necessary.

## 2018-05-15 NOTE — Telephone Encounter (Signed)
Spoke w/ Pt- he informed that for several days he has been having more severe pain in area of his hernia that radiates down his leg. He was constipated for several days and strained to go to the bathroom- was finally able to go however the hernia bulged- he is still able to push back in but immediately pops back out unlike before. Informed him that I recommended letting Dr. Audie Pinto office know and proceed to the ED- his hernia could become/became incarcerated. Pt verbalized understanding- he will let Dr. Dalbert Batman know and proceed to ED.

## 2018-05-15 NOTE — Telephone Encounter (Signed)
Copied from [place] [id number]. Topic: Quick Communication - See Telephone Encounter >> May 15, 2018  9:27 AM Marja Kays F wrote: Pt is needing to talk with Dr. Larose Kells in regards to his hernia and if the patent needs to call his surgeon Dr. Dalbert Batman or does Dr. Larose Kells want to refer him to someone else   Best number [phone]

## 2018-05-15 NOTE — Telephone Encounter (Signed)
Noted, thx.

## 2018-05-16 NOTE — Telephone Encounter (Signed)
thx

## 2018-05-16 NOTE — Telephone Encounter (Signed)
Raquel Sarna- please see my message regarding Pt below from yesterday- he never went to ED. Can you call and check on him please?

## 2018-05-16 NOTE — Telephone Encounter (Signed)
Author phoned pt. To follow-up on protruding hernia. Pt. stated he is scheduled to see Dr. Dalbert Batman for consultation on 7/2.  Pt. states his hernia is no longer protruding and is not painful at the moment. Pt. denies local color changes, or fever. Pt. states he still feels constipated. Author encouraged pt. to stay well hydrated, increase fiber intake, and take an OTC stool softener, as well as keep appointment with Dr. Dalbert Batman. Pt. Stated "If it gets bad, I know to go to the ED". Findings routed to Dr. Larose Kells.

## 2018-05-20 ENCOUNTER — Emergency Department (HOSPITAL_COMMUNITY)
Admission: EM | Admit: 2018-05-20 | Discharge: 2018-05-20 | Disposition: A | Discharge status: Home / Self Care | Payer: Medicare Other | Attending: Emergency Medicine | Admitting: Emergency Medicine

## 2018-05-20 ENCOUNTER — Other Ambulatory Visit: Payer: Self-pay

## 2018-05-20 ENCOUNTER — Emergency Department (HOSPITAL_COMMUNITY): Payer: Medicare Other

## 2018-05-20 ENCOUNTER — Telehealth: Payer: Self-pay | Admitting: Internal Medicine

## 2018-05-20 ENCOUNTER — Encounter (HOSPITAL_COMMUNITY): Payer: Self-pay

## 2018-05-20 DIAGNOSIS — K4090 Unilateral inguinal hernia, without obstruction or gangrene, not specified as recurrent: Secondary | ICD-10-CM

## 2018-05-20 DIAGNOSIS — Z79899 Other long term (current) drug therapy: Secondary | ICD-10-CM | POA: Diagnosis not present

## 2018-05-20 DIAGNOSIS — Z87891 Personal history of nicotine dependence: Secondary | ICD-10-CM | POA: Insufficient documentation

## 2018-05-20 DIAGNOSIS — I252 Old myocardial infarction: Secondary | ICD-10-CM | POA: Insufficient documentation

## 2018-05-20 DIAGNOSIS — I110 Hypertensive heart disease with heart failure: Secondary | ICD-10-CM | POA: Diagnosis not present

## 2018-05-20 DIAGNOSIS — R1032 Left lower quadrant pain: Secondary | ICD-10-CM | POA: Diagnosis not present

## 2018-05-20 DIAGNOSIS — I5022 Chronic systolic (congestive) heart failure: Secondary | ICD-10-CM | POA: Insufficient documentation

## 2018-05-20 DIAGNOSIS — Z7982 Long term (current) use of aspirin: Secondary | ICD-10-CM | POA: Insufficient documentation

## 2018-05-20 DIAGNOSIS — R42 Dizziness and giddiness: Secondary | ICD-10-CM | POA: Diagnosis not present

## 2018-05-20 DIAGNOSIS — R0602 Shortness of breath: Secondary | ICD-10-CM | POA: Diagnosis not present

## 2018-05-20 DIAGNOSIS — K5900 Constipation, unspecified: Secondary | ICD-10-CM | POA: Diagnosis not present

## 2018-05-20 DIAGNOSIS — E785 Hyperlipidemia, unspecified: Secondary | ICD-10-CM | POA: Diagnosis not present

## 2018-05-20 DIAGNOSIS — I2510 Atherosclerotic heart disease of native coronary artery without angina pectoris: Secondary | ICD-10-CM | POA: Diagnosis not present

## 2018-05-20 DIAGNOSIS — R112 Nausea with vomiting, unspecified: Secondary | ICD-10-CM | POA: Diagnosis not present

## 2018-05-20 LAB — CBC WITH DIFFERENTIAL/PLATELET
Basophils Absolute: 0 10*3/uL (ref 0.0–0.1)
Basophils Relative: 0 %
Eosinophils Absolute: 0.2 10*3/uL (ref 0.0–0.7)
Eosinophils Relative: 2 %
HCT: 43.8 % (ref 39.0–52.0)
Hemoglobin: 14.2 g/dL (ref 13.0–17.0)
Lymphocytes Relative: 10 %
Lymphs Abs: 0.8 10*3/uL (ref 0.7–4.0)
MCH: 32.1 pg (ref 26.0–34.0)
MCHC: 32.4 g/dL (ref 30.0–36.0)
MCV: 98.9 fL (ref 78.0–100.0)
Monocytes Absolute: 0.9 10*3/uL (ref 0.1–1.0)
Monocytes Relative: 11 %
Neutro Abs: 6.1 10*3/uL (ref 1.7–7.7)
Neutrophils Relative %: 77 %
Platelets: 169 10*3/uL (ref 150–400)
RBC: 4.43 MIL/uL (ref 4.22–5.81)
RDW: 13.9 % (ref 11.5–15.5)
WBC: 8 10*3/uL (ref 4.0–10.5)

## 2018-05-20 LAB — COMPREHENSIVE METABOLIC PANEL
ALT: 14 U/L (ref 0–44)
AST: 16 U/L (ref 15–41)
Albumin: 3.9 g/dL (ref 3.5–5.0)
Alkaline Phosphatase: 62 U/L (ref 38–126)
Anion gap: 8 (ref 5–15)
BUN: 14 mg/dL (ref 8–23)
CO2: 26 mmol/L (ref 22–32)
Calcium: 8.6 mg/dL — ABNORMAL LOW (ref 8.9–10.3)
Chloride: 104 mmol/L (ref 98–111)
Creatinine, Ser: 0.98 mg/dL (ref 0.61–1.24)
GFR calc Af Amer: >60 mL/min (ref >60)
GFR calc non Af Amer: >60 mL/min (ref >60)
Glucose, Bld: 111 mg/dL — ABNORMAL HIGH (ref 70–99)
Potassium: 4 mmol/L (ref 3.5–5.1)
Sodium: 138 mmol/L (ref 135–145)
Total Bilirubin: 1.1 mg/dL (ref 0.3–1.2)
Total Protein: 7 g/dL (ref 6.5–8.1)

## 2018-05-20 LAB — I-STAT CG4 LACTIC ACID, ED: Lactic Acid, Venous: 0.94 mmol/L (ref 0.5–1.9)

## 2018-05-20 MED ORDER — TRAMADOL HCL 50 MG PO TABS: 50.0000 mg | ORAL_TABLET | Freq: Four times a day (QID) | ORAL | 0 refills | Status: DC | PRN

## 2018-05-20 MED ORDER — TRAMADOL HCL 50 MG PO TABS
50.0000 mg | ORAL_TABLET | Freq: Once | ORAL | Status: DC
  Filled 2018-05-20: qty 1

## 2018-05-20 MED ORDER — MORPHINE SULFATE (PF) 4 MG/ML IV SOLN: 4.0000 mg | Freq: Once | INTRAVENOUS | Status: DC

## 2018-05-20 NOTE — Telephone Encounter (Signed)
Please enter a surgery referral and let the patient know that we are working on it.  DX inguinal hernia. If he is not contacted in the next few days he needs to let us know

## 2018-05-20 NOTE — Telephone Encounter (Signed)
-----   Message from Drenda Freeze, MD sent at 05/20/2018  2:00 PM EDT ----- Hi Dr. Larose Kells: This is Shirlyn Goltz, one of the ED docs. Mr. Lewellen was here today for his inguinal hernia. It is easily reducible. Wife canceled his appointment with Dr. Dalbert Batman today. I told them to call and get another appointment to schedule for surgery. Maybe your office can help expedite his follow up   Thanks Shanon Brow

## 2018-05-20 NOTE — ED Notes (Signed)
Bed: SW97 Expected date:  Expected time:  Means of arrival:  Comments: EMS- hernia pain

## 2018-05-20 NOTE — Discharge Instructions (Signed)
Take tylenol for pain   Take tramadol for severe pain.   Please call Dr. Darrel Hoover office either in [place] or [place] to schedule for surgery  No heavy lifting   Return to ER if you have worse abdominal pain, hernia not reducible, vomiting, not passing gas.

## 2018-05-20 NOTE — ED Triage Notes (Signed)
Per EMS:  Pt is from home. pt hx of L inguinal hernia.  Hx of hernia being reducible by pt, and pt could not reduce hernia.  Pt has appt today for consultation at 15:15.  Pt wanted to to come to see if anything else was going on.

## 2018-05-20 NOTE — ED Provider Notes (Signed)
[facility] DEPT Provider Note   CSN: [id number] Arrival date & time: 05/20/18  1151     History   Chief Complaint Chief Complaint  Patient presents with  . Inguinal Hernia    HPI Christopher Hogan is a 84 y.o. male previous AAA that was repaired, CAD, hypertension here presenting with inguinal hernia.  Patient states that he has known inguinal hernias.  States that the right inguinal hernia was repaired years ago.  He has been feeling a bulge in the left lower quadrant for the last week or so.  He already saw his primary care doctor was diagnosed with inguinal hernia and was referred to see a surgeon today.  He noticed more pain in the left inguinal area.  Denies any vomiting and has been passing gas.  Denies any fevers or chills.  The history is provided by the patient.    Past Medical History:  Diagnosis Date  . AAA (abdominal aortic aneurysm) ([place])    repaired 02/2002, renal ultrasound, Sept 2010: normal abdominal aorta, normal renal arteries   . Allergic rhinitis   . CAD (coronary artery disease)    stable stress test 7/11  . CHF (congestive heart failure) ([place])   . ED (erectile dysfunction)   . GERD (gastroesophageal reflux disease)   . Hyperlipidemia   . Hypertension   . Myocardial infarction [facility]) 1989   interior wall infarction    Patient Active Problem List   Diagnosis Date Noted  . Insomnia 02/22/2018  . PCP notes >>>>>>>>>>>>>>>>>>>> 07/17/2016  . Vertigo 06/20/2015  . Chronic systolic CHF (congestive heart failure) ([place]) 12/17/2013  . Eczema 07/09/2013  . Annual physical exam 12/31/2011  . Inguinal hernia 12/31/2011  . Glaucoma and macular degeneration 12/31/2011  . DEGENERATIVE JOINT DISEASE 03/17/2010  . GERD 07/21/2008  . Rhinitis 04/20/2008  . ERECTILE DYSFUNCTION 12/09/2007  . Coronary atherosclerosis 06/09/2007  . Hyperlipidemia 05/06/2007  . Essential hypertension 04/30/2007    Past Surgical History:  Procedure  Laterality Date  . AAA repair  2003  . CHOLECYSTECTOMY    . INGUINAL HERNIA REPAIR Right 1985  . PTCA  1989, 1992        Home Medications    Prior to Admission medications   Medication Sig Start Date End Date Taking? Authorizing Provider  amitriptyline (ELAVIL) 10 MG tablet Take 1 tablet (10 mg total) by mouth at bedtime as needed for sleep. 02/21/18   Colon Branch, MD  amLODipine (NORVASC) 2.5 MG tablet Take 1 tablet (2.5 mg total) by mouth daily. 10/29/16 02/21/18  Dorothy Spark, MD  aspirin 81 MG chewable tablet Chew 162 mg by mouth daily.     [provider]  atenolol (TENORMIN) 50 MG tablet TAKE ONE TABLET BY MOUTH EVERY DAY 12/17/13   Dorothy Spark, MD  azelastine (ASTELIN) 0.1 % nasal spray Place 2 sprays into both nostrils 2 (two) times daily. Use in each nostril as directed 06/28/17   Colon Branch, MD  Dorzolamide HCl-Timolol Mal PF 22.3-6.8 MG/ML SOLN Apply 1 drop to eye 2 (two) times daily. bith eyes    [provider]  gemfibrozil (LOPID) 600 MG tablet TAKE ONE TABLET BY MOUTH EVERY DAY 04/22/13   Colon Branch, MD  loratadine (CLARITIN) 10 MG tablet Take 10 mg by mouth daily.    [provider]  meclizine (ANTIVERT) 25 MG tablet Take 1 tablet (25 mg total) by mouth 2 (two) times daily as needed for dizziness. 06/20/15  Colon Branch, MD  Multiple Vitamin (MULTIVITAMIN) capsule Take 1 capsule by mouth daily.      [provider]  Multiple Vitamins-Minerals (ICAPS AREDS FORMULA PO) Take 1 capsule by mouth daily.    [provider]  omeprazole (PRILOSEC) 20 MG capsule Take 20 mg by mouth daily.      [provider]  simvastatin (ZOCOR) 10 MG tablet Take 1 tablet (10 mg total) by mouth at bedtime. 05/14/12   Colon Branch, MD    Family History Family History  Problem Relation Age of Onset  . Lung cancer Father   . CAD Other        mother?  . Kidney Stones Son   . Prostate cancer Neg Hx   . Diabetes Neg Hx   . Colon cancer  Neg Hx     Social History Social History   Tobacco Use  . Smoking status: Former Research scientist (life sciences)  . Smokeless tobacco: Never Used  . Tobacco comment: quit in 1953  Substance Use Topics  . Alcohol use: No  . Drug use: No     Allergies   Ace inhibitors; Aspirin; and Spironolactone   Review of Systems Review of Systems  Gastrointestinal: Positive for abdominal pain.  All other systems reviewed and are negative.    Physical Exam Updated Vital Signs BP 114/61   Pulse 64   Temp 98.4 F (36.9 C) (Oral)   Resp 16   Ht 5\' 8"  (1.727 m)   Wt 70.8 kg (156 lb)   SpO2 95%   BMI 23.72 kg/m   Physical Exam  Constitutional: He is oriented to person, place, and time. He appears well-developed.  Well appearing for age   HENT:  Head: Normocephalic.  Mouth/Throat: Oropharynx is clear and moist.  Eyes: Pupils are equal, round, and reactive to light. Conjunctivae and EOM are normal.  Neck: Normal range of motion. Neck supple.  Cardiovascular: Normal rate, regular rhythm and normal heart sounds.  Pulmonary/Chest: Effort normal and breath sounds normal. No stridor. No respiratory distress. He has no wheezes.  Abdominal: Soft. Bowel sounds are normal.  L direct inguinal hernia, easily reducible   Genitourinary:  Genitourinary Comments: Testicles nontender or swollen   Musculoskeletal: Normal range of motion.  Neurological: He is alert and oriented to person, place, and time.  Skin: Skin is warm.  Psychiatric: He has a normal mood and affect.  Nursing note and vitals reviewed.    ED Treatments / Results  Labs (all labs ordered are listed, but only abnormal results are displayed) Labs Reviewed  COMPREHENSIVE METABOLIC PANEL - Abnormal; Notable for the following components:      Result Value   Glucose, Bld 111 (*)    Calcium 8.6 (*)    All other components within normal limits  CBC WITH DIFFERENTIAL/PLATELET  I-STAT CG4 LACTIC ACID, ED    EKG None  Radiology Dg Abd Acute  W/chest  Result Date: 05/20/2018 CLINICAL DATA:  Inguinal hernia. EXAM: DG ABDOMEN ACUTE W/ 1V CHEST COMPARISON:  None. FINDINGS: There is no evidence of dilated bowel loops or free intraperitoneal air. No radiopaque calculi or other significant radiographic abnormality is seen. Status post cholecystectomy. Heart size and mediastinal contours are within normal limits. Both lungs are clear. IMPRESSION: No evidence of bowel obstruction or ileus. No acute cardiopulmonary disease. Electronically Signed   By: Marijo Conception, M.D.   On: 05/20/2018 13:25    Procedures Procedures (including critical care time)  Reduction of hernia I  was able to palpate L inguinal hernia. Manual pressure applied and hernia was reducible   Medications Ordered in ED Medications  traMADol (ULTRAM) tablet 50 mg (50 mg Oral Refused 05/20/18 1223)     Initial Impression / Assessment and Plan / ED Course  I have reviewed the triage vital signs and the nursing notes.  Pertinent labs & imaging results that were available during my care of the patient were reviewed by me and considered in my medical decision making (see chart for details).     Christopher Hogan is a 84 y.o. male here with L inguinal hernia. Hernia easily reducible. Will get labs and lactate and xrays to assess for SBO or incarceration. Has surgery appointment later today.   1:55 PM Xrays showed no SBO, lactate nl. Wife at bedside. She called and canceled the appointment. I talked to the office and the appointment is indeed canceled. Will dc home with tramadol prn. Encourage them to call office for appointment.     Final Clinical Impressions(s) / ED Diagnoses   Final diagnoses:  None    ED Discharge Orders    None       Drenda Freeze, MD 05/20/18 1358

## 2018-05-20 NOTE — Telephone Encounter (Signed)
Referral placed.

## 2018-05-21 NOTE — Telephone Encounter (Signed)
Tried calling Pt to inform we have placed a referral to general surgery no answer.

## 2018-06-05 ENCOUNTER — Other Ambulatory Visit: Payer: Self-pay | Admitting: General Surgery

## 2018-06-05 ENCOUNTER — Telehealth: Payer: Self-pay | Admitting: *Deleted

## 2018-06-05 DIAGNOSIS — Z9889 Other specified postprocedural states: Secondary | ICD-10-CM | POA: Diagnosis not present

## 2018-06-05 DIAGNOSIS — I2510 Atherosclerotic heart disease of native coronary artery without angina pectoris: Secondary | ICD-10-CM | POA: Diagnosis not present

## 2018-06-05 DIAGNOSIS — K4090 Unilateral inguinal hernia, without obstruction or gangrene, not specified as recurrent: Secondary | ICD-10-CM | POA: Diagnosis not present

## 2018-06-05 DIAGNOSIS — I10 Essential (primary) hypertension: Secondary | ICD-10-CM | POA: Diagnosis not present

## 2018-06-05 DIAGNOSIS — I252 Old myocardial infarction: Secondary | ICD-10-CM | POA: Diagnosis not present

## 2018-06-05 DIAGNOSIS — Z8719 Personal history of other diseases of the digestive system: Secondary | ICD-10-CM | POA: Diagnosis not present

## 2018-06-05 NOTE — Telephone Encounter (Signed)
   [facility] Pre-operative Risk Assessment    Request for surgical clearance:  1. What type of surgery is being performed? Left inguinal hernea   2. When is this surgery scheduled? TBD pending cardiac clearance   3. What type of clearance is required (medical clearance vs. Pharmacy clearance to hold med vs. Both)? BOTH  4. Are there any medications that need to be held prior to surgery and how long? Aspirin x 3 days   5. Practice name and name of physician performing surgery? [facility], Fanny Skates, MD   6. What is your office phone number [phone]    7.   What is your office fax number [phone]  8.   Anesthesia type (None, local, MAC, general) ? General  __office visit progress notes to Dr. Meda Coffee: "large left inguinal hernia, you are having pain; you have had some episodes of near incarceration requiring forcible reduction of the hernia"___   Rodman Key 06/05/2018, 4:15 PM  _________________________________________________________________   (provider comments below)

## 2018-06-10 NOTE — Telephone Encounter (Signed)
   Primary Cardiologist:Katarina Meda Coffee, MD  Chart reviewed as part of pre-operative protocol coverage. Because of Christopher Hogan's past medical history and time since last visit (not seen since 2017), he will require a follow-up visit in order to better assess preoperative cardiovascular risk.  Pre-op covering staff: - Please schedule appointment with Dr. Meda Coffee or care team and call patient to inform them. - Please forward this phone note to the provider that will evaluate the patient as he or she will be making final recommendations regarding surgical risk. - Please contact requesting surgeon's office via preferred method (i.e, phone, fax) to inform them of need for appointment prior to surgery. -This phone note will be removed from the preop pool.  Richardson Dopp, PA-C  06/10/2018, 4:20 PM

## 2018-06-12 ENCOUNTER — Encounter (INDEPENDENT_AMBULATORY_CARE_PROVIDER_SITE_OTHER): Payer: Medicare Other | Admitting: Ophthalmology

## 2018-06-12 DIAGNOSIS — I10 Essential (primary) hypertension: Secondary | ICD-10-CM | POA: Diagnosis not present

## 2018-06-12 DIAGNOSIS — H35033 Hypertensive retinopathy, bilateral: Secondary | ICD-10-CM | POA: Diagnosis not present

## 2018-06-12 DIAGNOSIS — H353231 Exudative age-related macular degeneration, bilateral, with active choroidal neovascularization: Secondary | ICD-10-CM | POA: Diagnosis not present

## 2018-06-12 DIAGNOSIS — H43813 Vitreous degeneration, bilateral: Secondary | ICD-10-CM | POA: Diagnosis not present

## 2018-06-12 NOTE — Telephone Encounter (Signed)
Leave message on pt voicemail to give office a call to schedule appt

## 2018-06-13 NOTE — Telephone Encounter (Signed)
Patient notified directly and appointment made.

## 2018-06-18 ENCOUNTER — Other Ambulatory Visit: Payer: Self-pay | Admitting: General Surgery

## 2018-06-20 ENCOUNTER — Ambulatory Visit: Payer: Medicare Other | Admitting: Internal Medicine

## 2018-06-23 ENCOUNTER — Encounter: Payer: Self-pay | Admitting: Cardiology

## 2018-06-23 DIAGNOSIS — I255 Ischemic cardiomyopathy: Secondary | ICD-10-CM | POA: Insufficient documentation

## 2018-06-23 HISTORY — DX: Ischemic cardiomyopathy: I25.5

## 2018-06-24 ENCOUNTER — Ambulatory Visit (INDEPENDENT_AMBULATORY_CARE_PROVIDER_SITE_OTHER): Payer: Medicare Other | Admitting: Cardiology

## 2018-06-24 ENCOUNTER — Encounter: Payer: Self-pay | Admitting: Cardiology

## 2018-06-24 VITALS — BP 105/70 | HR 70 | Ht 68.0 in | Wt 146.0 lb

## 2018-06-24 DIAGNOSIS — I2510 Atherosclerotic heart disease of native coronary artery without angina pectoris: Secondary | ICD-10-CM

## 2018-06-24 DIAGNOSIS — I255 Ischemic cardiomyopathy: Secondary | ICD-10-CM | POA: Diagnosis not present

## 2018-06-24 DIAGNOSIS — I10 Essential (primary) hypertension: Secondary | ICD-10-CM

## 2018-06-24 DIAGNOSIS — Z01810 Encounter for preprocedural cardiovascular examination: Secondary | ICD-10-CM

## 2018-06-24 DIAGNOSIS — E785 Hyperlipidemia, unspecified: Secondary | ICD-10-CM

## 2018-06-24 NOTE — Assessment & Plan Note (Signed)
Last EF 30-44% by [facility] Feb 2017

## 2018-06-24 NOTE — Progress Notes (Signed)
06/24/2018 Christopher Hogan   07-29-1925  [id number]  Primary Physician Colon Branch, MD Primary Cardiologist: Dr Meda Coffee  HPI:  Pleasant, married 84 y/o male with a history of remote inferior wall MI treated with PTCA. His last Myoview was 2017 and showed inferior scar without ischemia. He has LVD with an EF of 33-48%. In addition he has treated HTN, HLD, and is s/p AAA repair in 2003. He has done well from a cardiac standpoint. He is in the office today accompanied by his wife for pre op clearance prior to Lt inguinal hernia repair.   The pt had CAP 6-8 weeks ago and he says he is still a little weak from that. He denies any chest pain or unusual dyspnea. He walks with a cane but gets around pretty well.    Current Outpatient Medications  Medication Sig Dispense Refill  . amitriptyline (ELAVIL) 10 MG tablet Take 1 tablet (10 mg total) by mouth at bedtime as needed for sleep. 30 tablet 3  . amLODipine (NORVASC) 2.5 MG tablet Take 1 tablet (2.5 mg total) by mouth daily. 90 tablet 3  . aspirin 81 MG chewable tablet Chew 81 mg by mouth daily.     Marland Kitchen atenolol (TENORMIN) 50 MG tablet TAKE ONE TABLET BY MOUTH EVERY DAY 90 tablet 3  . azelastine (ASTELIN) 0.1 % nasal spray Place 2 sprays into both nostrils 2 (two) times daily. Use in each nostril as directed 30 mL 3  . Dorzolamide HCl-Timolol Mal PF 22.3-6.8 MG/ML SOLN Apply 1 drop to eye 2 (two) times daily. bith eyes    . gemfibrozil (LOPID) 600 MG tablet TAKE ONE TABLET BY MOUTH EVERY DAY 90 tablet 3  . loratadine (CLARITIN) 10 MG tablet Take 10 mg by mouth daily.    . meclizine (ANTIVERT) 25 MG tablet Take 1 tablet (25 mg total) by mouth 2 (two) times daily as needed for dizziness. 60 tablet 1  . Multiple Vitamin (MULTIVITAMIN) capsule Take 1 capsule by mouth daily.      . Multiple Vitamins-Minerals (ICAPS AREDS FORMULA PO) Take 1 capsule by mouth daily.    Marland Kitchen omeprazole (PRILOSEC) 20 MG capsule Take 20 mg by mouth daily.      . simvastatin  (ZOCOR) 10 MG tablet Take 1 tablet (10 mg total) by mouth at bedtime. 90 tablet 3  . traMADol (ULTRAM) 50 MG tablet Take 1 tablet (50 mg total) by mouth every 6 (six) hours as needed. 10 tablet 0   No current facility-administered medications for this visit.     Allergies  Allergen Reactions  . Ace Inhibitors     REACTION: cough  . Aspirin     REACTION: nausea: GERD Full strength only  . Spironolactone Rash    Past Medical History:  Diagnosis Date  . AAA (abdominal aortic aneurysm) ([place])    repaired 02/2002, renal ultrasound, Sept 2010: normal abdominal aorta, normal renal arteries   . Allergic rhinitis   . CAD (coronary artery disease)    stable stress test 7/11  . CHF (congestive heart failure) ([place])   . ED (erectile dysfunction)   . GERD (gastroesophageal reflux disease)   . Hyperlipidemia   . Hypertension   . Myocardial infarction ([place]) 1989   interior wall infarction    Social History   Socioeconomic History  . Marital status: Married    Spouse name: Not on file  . Number of children: 3  . Years of education: Not on file  .  Highest education level: Not on file  Occupational History  . Occupation: retired     Fish farm manager: RETIRED  Social Needs  . Financial resource strain: Not on file  . Food insecurity:    Worry: Not on file    Inability: Not on file  . Transportation needs:    Medical: Not on file    Non-medical: Not on file  Tobacco Use  . Smoking status: Former Research scientist (life sciences)  . Smokeless tobacco: Never Used  . Tobacco comment: quit in 1953  Substance and Sexual Activity  . Alcohol use: No  . Drug use: No  . Sexual activity: Not on file  Lifestyle  . Physical activity:    Days per week: Not on file    Minutes per session: Not on file  . Stress: Not on file  Relationships  . Social connections:    Talks on phone: Not on file    Gets together: Not on file    Attends religious service: Not on file    Active member of club or organization: Not on file     Attends meetings of clubs or organizations: Not on file    Relationship status: Not on file  . Intimate partner violence:    Fear of current or ex partner: Not on file    Emotionally abused: Not on file    Physically abused: Not on file    Forced sexual activity: Not on file  Other Topics Concern  . Not on file  Social History Narrative   WWII veteran   Independent on his ADL     Lives w/ wife     Family History  Problem Relation Age of Onset  . Lung cancer Father   . CAD Other        mother?  . Kidney Stones Son   . Prostate cancer Neg Hx   . Diabetes Neg Hx   . Colon cancer Neg Hx      Review of Systems: General: negative for chills, fever, night sweats or weight changes.  Cardiovascular: negative for chest pain, dyspnea on exertion, edema, orthopnea, palpitations, paroxysmal nocturnal dyspnea or shortness of breath Dermatological: negative for rash Respiratory: negative for cough or wheezing Urologic: negative for hematuria Abdominal: negative for nausea, vomiting, diarrhea, bright red blood per rectum, melena, or hematemesis Neurologic: negative for visual changes, syncope, or dizziness All other systems reviewed and are otherwise negative except as noted above.    Blood pressure 105/70, pulse 70, height 5\' 8"  (1.727 m), weight 146 lb (66.2 kg).  General appearance: alert, cooperative and no distress Neck: no carotid bruit and no JVD Lungs: velcro crackles at bases (scarring on prior CXR) Heart: regular rate and rhythm Abdomen: soft, non-tender; bowel sounds normal; no masses,  no organomegaly and midline surgical scar Extremities: no edema Pulses: 2+ and symmetric Skin: cool, pale, dry Neurologic: Grossly normal  EKG NSR, inferior Qs, incomplete RBBB  ASSESSMENT AND PLAN:   Coronary atherosclerosis H/O inferior wall MI- PTCA- 1989 Last stress test 2/17- inferior scar, no ischemia    Ischemic cardiomyopathy Last EF 30-44% by [facility] Feb 2017  Essential  hypertension Controlled  Dyslipidemia LDL 59 Jan 2019    Pre-operative cardiovascular examination Seen today for pre op clearance prior to Lt inguinal hernia repair   PLAN  Chart reviewed and patient seen and examined as part of pre-operative protocol coverage. Based on ACC/AHA guidelines, Christopher Hogan would be at acceptable risk for the planned procedure without further cardiovascular testing.  OK to hold ASA 3 days pre op if needed  I will route this recommendation to the requesting party via Epic fax function and remove from pre-op pool.  Please call with questions.  Kerin Ransom, PA-C 06/24/2018, 8:20 AM

## 2018-06-24 NOTE — Assessment & Plan Note (Signed)
Controlled.  

## 2018-06-24 NOTE — Assessment & Plan Note (Signed)
Seen today for pre op clearance prior to Lt inguinal hernia repair

## 2018-06-24 NOTE — Assessment & Plan Note (Signed)
LDL 59 Jan 2019

## 2018-06-24 NOTE — Assessment & Plan Note (Signed)
H/O inferior wall MI- PTCA- 1989 Last stress test 2/17- inferior scar, no ischemia

## 2018-06-24 NOTE — Patient Instructions (Signed)
Medication Instructions:  Your physician recommends that you continue on your current medications as directed. Please refer to the Current Medication list given to you today.  Labwork: None   Testing/Procedures: None   Follow-Up: Your physician recommends that you schedule a follow-up appointment in: 3 months with Dr Meda Coffee.   Any Other Special Instructions Will Be Listed Below (If Applicable). If you need a refill on your cardiac medications before your next appointment, please call your pharmacy.

## 2018-06-25 ENCOUNTER — Ambulatory Visit (INDEPENDENT_AMBULATORY_CARE_PROVIDER_SITE_OTHER): Payer: Medicare Other | Admitting: Internal Medicine

## 2018-06-25 ENCOUNTER — Encounter: Payer: Self-pay | Admitting: Internal Medicine

## 2018-06-25 VITALS — BP 132/72 | HR 68 | Temp 97.7°F | Resp 14 | Ht 68.0 in | Wt 147.0 lb

## 2018-06-25 DIAGNOSIS — K4091 Unilateral inguinal hernia, without obstruction or gangrene, recurrent: Secondary | ICD-10-CM | POA: Diagnosis not present

## 2018-06-25 DIAGNOSIS — Z9189 Other specified personal risk factors, not elsewhere classified: Secondary | ICD-10-CM

## 2018-06-25 DIAGNOSIS — I255 Ischemic cardiomyopathy: Secondary | ICD-10-CM

## 2018-06-25 HISTORY — DX: Other specified personal risk factors, not elsewhere classified: Z91.89

## 2018-06-25 NOTE — Assessment & Plan Note (Signed)
Inguinal hernia, in need of surgery, already cleared by cardiology. Pulmonary fibrosis?  I am somewhat concerned about the question of pulmonary fibrosis on him, I informally consulted pulmonary regards to this issue and the risk of surgery.  Given that he is functional 84 year old gentleman with normal oxygen levels, no DOE with ADLs and the surgeries far from the chest he is considered an appropriate candidate. I will send a message to the patient's surgeon Dr Dalbert Batman, letting him know.

## 2018-06-25 NOTE — Progress Notes (Signed)
Subjective:    Patient ID: Christopher Hogan, male    DOB: 08-11-1925, 84 y.o.   MRN: [id number]  DOS:  06/25/2018 Type of visit - description : f/u Interval history:  Since the last visit, went to the ER, was seen for a left inguinal hernia.  Labs were okay except for slightly decreased calcium. Subsequently he saw cardiology and was cleared for surgery   Wt Readings from Last 3 Encounters:  06/25/18 147 lb (66.7 kg)  06/24/18 146 lb (66.2 kg)  05/20/18 156 lb (70.8 kg)    Review of Systems Denies fever, chills. No nausea, vomiting or constipation. The hernia continues to be reducible and has not been as large  as it was during the ER visit. Denies cough. No shortness of breath at rest, able to go up to a second floor without getting short of breath.    Past Medical History:  Diagnosis Date  . AAA (abdominal aortic aneurysm) ([place])    repaired 02/2002, renal ultrasound, Sept 2010: normal abdominal aorta, normal renal arteries   . Allergic rhinitis   . CAD (coronary artery disease)    stable stress test 7/11  . CHF (congestive heart failure) ([place])   . ED (erectile dysfunction)   . GERD (gastroesophageal reflux disease)   . Hyperlipidemia   . Hypertension   . Myocardial infarction ([place]) 1989   interior wall infarction    Past Surgical History:  Procedure Laterality Date  . AAA repair  2003  . CHOLECYSTECTOMY    . INGUINAL HERNIA REPAIR Right 1985  . PTCA  1989, 1992    Social History   Socioeconomic History  . Marital status: Married    Spouse name: Not on file  . Number of children: 3  . Years of education: Not on file  . Highest education level: Not on file  Occupational History  . Occupation: retired     Fish farm manager: RETIRED  Social Needs  . Financial resource strain: Not on file  . Food insecurity:    Worry: Not on file    Inability: Not on file  . Transportation needs:    Medical: Not on file    Non-medical: Not on file  Tobacco Use  . Smoking  status: Former Research scientist (life sciences)  . Smokeless tobacco: Never Used  . Tobacco comment: quit in 1953  Substance and Sexual Activity  . Alcohol use: No  . Drug use: No  . Sexual activity: Not Currently  Lifestyle  . Physical activity:    Days per week: Not on file    Minutes per session: Not on file  . Stress: Not on file  Relationships  . Social connections:    Talks on phone: Not on file    Gets together: Not on file    Attends religious service: Not on file    Active member of club or organization: Not on file    Attends meetings of clubs or organizations: Not on file    Relationship status: Not on file  . Intimate partner violence:    Fear of current or ex partner: Not on file    Emotionally abused: Not on file    Physically abused: Not on file    Forced sexual activity: Not on file  Other Topics Concern  . Not on file  Social History Narrative   WWII veteran   Independent on his ADL     Lives w/ wife      Allergies as of 06/25/2018  Reactions   Ace Inhibitors    REACTION: cough   Aspirin    REACTION: nausea: GERD Full strength only   Spironolactone Rash      Medication List        Accurate as of 06/25/18  3:55 PM. Always use your most recent med list.          amitriptyline 10 MG tablet Commonly known as:  ELAVIL Take 1 tablet (10 mg total) by mouth at bedtime as needed for sleep.   amLODipine 2.5 MG tablet Commonly known as:  NORVASC Take 1 tablet (2.5 mg total) by mouth daily.   aspirin 81 MG chewable tablet Chew 81 mg by mouth daily.   atenolol 50 MG tablet Commonly known as:  TENORMIN TAKE ONE TABLET BY MOUTH EVERY DAY   azelastine 0.1 % nasal spray Commonly known as:  ASTELIN Place 2 sprays into both nostrils 2 (two) times daily. Use in each nostril as directed   CLARITIN 10 MG tablet Generic drug:  loratadine Take 10 mg by mouth daily.   dorzolamidel-timolol 22.3-6.8 MG/ML Soln ophthalmic solution Commonly known as:  COSOPT Apply 1 drop to eye  2 (two) times daily. bith eyes   gemfibrozil 600 MG tablet Commonly known as:  LOPID TAKE ONE TABLET BY MOUTH EVERY DAY   ICAPS AREDS FORMULA PO Take 1 capsule by mouth daily.   meclizine 25 MG tablet Commonly known as:  ANTIVERT Take 1 tablet (25 mg total) by mouth 2 (two) times daily as needed for dizziness.   MELATONIN PO Take by mouth.   multivitamin capsule Take 1 capsule by mouth daily.   omeprazole 20 MG capsule Commonly known as:  PRILOSEC Take 20 mg by mouth daily.   simvastatin 10 MG tablet Commonly known as:  ZOCOR Take 1 tablet (10 mg total) by mouth at bedtime.   traMADol 50 MG tablet Commonly known as:  ULTRAM Take 1 tablet (50 mg total) by mouth every 6 (six) hours as needed.          Objective:   Physical Exam  Genitourinary:      BP 132/72 (BP Location: Right Arm, Patient Position: Sitting, Cuff Size: Small)   Pulse 68   Temp 97.7 F (36.5 C) (Oral)   Resp 14   Ht 5\' 8"  (1.727 m)   Wt 147 lb (66.7 kg)   SpO2 96%   BMI 22.35 kg/m  General:   Well developed, NAD, see BMI.  HEENT:  Normocephalic . Face symmetric, atraumatic Lungs:  Velcro type of crackles at bases otherwise clear Normal respiratory effort, no intercostal retractions, no accessory muscle use. Heart: RRR,  no murmur.  No pretibial edema bilaterally  Skin: Not pale. Not jaundice Neurologic:  alert & oriented X3.  Speech normal, gait appropriate for age and unassisted Psych--  Cognition and judgment appear intact.  Cooperative with normal attention span and concentration.  Behavior appropriate. No anxious or depressed appearing.      Assessment & Plan:   Assessment HTN Hyperlipidemia GERD Insomnia-- elavil prn All rhinitis CV: --CAD, MI 1989, stress test (-) 2011  --CHF --lexiscan no ischemia  08-2016 --AAA, s/p repair 2003, [place] 2010 ok Opht: glaucoma, macular degeneration  [facility]   PLAN: Inguinal hernia, in need of surgery, already  cleared by cardiology. Pulmonary fibrosis?  I am somewhat concerned about the question of pulmonary fibrosis on him, I informally consulted pulmonary regards to this issue and the risk of surgery.  Given that he is functional 84 year old gentleman with normal oxygen levels, no DOE with ADLs and the surgeries far from the chest he is considered an appropriate candidate. I will send a message to the patient's surgeon Dr Dalbert Batman, letting him know.

## 2018-06-25 NOTE — Progress Notes (Signed)
Pre visit review using our clinic review tool, if applicable. No additional management support is needed unless otherwise documented below in the visit note. 

## 2018-06-25 NOTE — Patient Instructions (Signed)
  GO TO THE FRONT DESK Schedule your next appointment for a checkup in 4 months      [facility] dermatology Phone [phone]

## 2018-06-27 ENCOUNTER — Other Ambulatory Visit: Payer: Self-pay

## 2018-06-27 ENCOUNTER — Encounter (HOSPITAL_COMMUNITY): Payer: Self-pay | Admitting: *Deleted

## 2018-06-27 MED ORDER — CEFAZOLIN SODIUM-DEXTROSE 2-4 GM/100ML-% IV SOLN
2.0000 g | INTRAVENOUS | Status: AC
  Administered 2018-06-30: 2 g via INTRAVENOUS
  Filled 2018-06-27: qty 100

## 2018-06-27 MED ORDER — ACETAMINOPHEN 500 MG PO TABS
1000.0000 mg | ORAL_TABLET | ORAL | Status: AC
  Administered 2018-06-30: 1000 mg via ORAL
  Filled 2018-06-27: qty 2

## 2018-06-27 MED ORDER — BUPIVACAINE LIPOSOME 1.3 % IJ SUSP
20.0000 mL | INTRAMUSCULAR | Status: DC
  Filled 2018-06-27: qty 20

## 2018-06-27 MED ORDER — GABAPENTIN 300 MG PO CAPS
300.0000 mg | ORAL_CAPSULE | ORAL | Status: AC
  Administered 2018-06-30: 300 mg via ORAL
  Filled 2018-06-27: qty 1

## 2018-06-28 NOTE — H&P (Signed)
Christopher Hogan Location: [facility] Patient #: [id number] DOB: Aug 13, 1925 Married / Language: English / Race: White Male        History of Present Illness       This is a very pleasant man, 84 years old. His second wife is with him throughout the encounter. He is referred by Dr. Larose Kells for urgent evaluation of large left inguinal hernia with recent incarceration. Dr. Ottie Glazier is his cardiologist.      Dr. Sheilah Pigeon repaired his right inguinal hernia many years ago and that repair has held. He has had a progressive bulge in his left groin. He's had 2 recent episodes of incarceration requiring forcible reduction 1 event lead to ER visit on July 2. He's had no nausea or vomiting. He would like to have this repaired. He's been wearing a truss but doesn't seem to be helping that much.      Past history consistent with laparoscopic cholecystectomy by me in the early 90s. Right inguinal hernia repair by Dr. Sheilah Pigeon many years ago. Abdominal aortic aneurysm repair with good recovery. Coronary artery disease with angioplasty 1989 by Dr. Olevia Perches. Cardial infarction. Undefined congestive heart failure. Hypertension. Family history noncontributory Social history reveals he is married to his second wife lives in [place]. 3 children by his first wife. Retired Cabin crew. Denies alcohol or tobacco. Used to smoke but quit at age 46. Review of systems reveals that he has no chest pain or shortness of breath. Can walk up 1 or 2 flights of stairs. Good balance.     Exam revealed a large left inguinal hernia. I had to forcibly reduce this when supine but it was completely reducible.     I think he is a surgical candidate and I think that we need to proceed with repair due to his high risk of incarceration and strangulation We will try to expedite this surgery and we will try to expedite cardiac risk assessment and cardiac clearance He will be scheduled for open repair of left  inguinal hernia with mesh [place]. We will keep him overnight for observation because of his age, cardiac disease, and risk of urinary retention I discussed the indications, details, techniques, and risk of the surgery with him and his wife in detail. He is aware the risk of bleeding, infection, recurrence, nerve damage, injury to adjacent organs such as the bladder intestine or testicle. Also risk of cardiac pulmonary thromboembolic problems. He understands these issues well. All of his questions are answered. He agrees with this plan.  Hold aspirin 3 days preoperative okay with cardiology Orders and posting sheet completed   Past Surgical History  Aneurysm Repair  Cataract Surgery  Bilateral. Gallbladder Surgery - Open  Laparoscopic Inguinal Hernia Surgery  Right.  Diagnostic Studies History  Colonoscopy  never  Allergies  No Known Drug Allergies : Allergies Reconciled   Medication History AmLODIPine Besylate (2.5MG  Tablet, Oral) Active. Aspirin (81MG  Tablet, Oral) Active. Atenolol (50MG  Tablet, Oral) Active. Azelastine HCl (0.1% Solution, Nasal) Active. Claritin (10MG  Tablet, Oral) Active. Cosopt (22.3-6.8MG /ML Solution, Ophthalmic) Active. Gemfibrozil (600MG  Tablet, Oral) Active. Meclizine HCl (25MG  Tablet, Oral) Active. Multi-Vitamin (Oral) Active. Omeprazole (40MG  Capsule DR, Oral) Active. Simvastatin (10MG  Tablet, Oral) Active. Medications Reconciled  Social History  Alcohol use  Remotely quit alcohol use. Caffeine use  Carbonated beverages, Coffee. No drug use  Tobacco use  Former smoker.  Family History  Alcohol Abuse  Father, Son. Hypertension  Mother. Rectal Cancer  Brother. Respiratory Condition  Father.  Other Problems  Arthritis  Gastroesophageal Reflux Disease  High blood pressure  Hypercholesterolemia  Kidney Stone  Myocardial infarction  Umbilical Hernia Repair     Review of Systems General  Present- Weight Loss. Not Present- Appetite Loss, Chills, Fatigue, Fever, Night Sweats and Weight Gain. HEENT Present- Sinus Pain and Wears glasses/contact lenses. Not Present- Earache, Hearing Loss, Hoarseness, Nose Bleed, Oral Ulcers, Ringing in the Ears, Seasonal Allergies, Sore Throat, Visual Disturbances and Yellow Eyes. Respiratory Not Present- Bloody sputum, Chronic Cough, Difficulty Breathing, Snoring and Wheezing. Breast Not Present- Breast Mass, Breast Pain, Nipple Discharge and Skin Changes. Cardiovascular Not Present- Chest Pain, Difficulty Breathing Lying Down, Leg Cramps, Palpitations, Rapid Heart Rate, Shortness of Breath and Swelling of Extremities. Gastrointestinal Present- Abdominal Pain and Excessive gas. Not Present- Bloating, Bloody Stool, Change in Bowel Habits, Chronic diarrhea, Constipation, Difficulty Swallowing, Gets full quickly at meals, Hemorrhoids, Indigestion, Nausea, Rectal Pain and Vomiting. Male Genitourinary Present- Nocturia. Not Present- Blood in Urine, Change in Urinary Stream, Frequency, Impotence, Painful Urination, Urgency and Urine Leakage. Musculoskeletal Not Present- Back Pain, Joint Pain, Joint Stiffness, Muscle Pain, Muscle Weakness and Swelling of Extremities. Neurological Not Present- Decreased Memory, Fainting, Headaches, Numbness, Seizures, Tingling, Tremor, Trouble walking and Weakness. Endocrine Not Present- Cold Intolerance, Excessive Hunger, Hair Changes, Heat Intolerance, Hot flashes and New Diabetes. Hematology Present- Blood Thinners. Not Present- Easy Bruising, Excessive bleeding, Gland problems, HIV and Persistent Infections.  Vitals  Weight: 146 lb Height: 66in Body Surface Area: 1.75 m Body Mass Index: 23.56 kg/m  Temp.: 98.49F  Pulse: 68 (Regular)  BP: 122/64 (Sitting, Left Arm, Standard)       Physical Exam General Mental Status-Alert. General Appearance-Consistent with stated age. Hydration-Well  hydrated. Voice-Normal.  Head and Neck Head-normocephalic, atraumatic with no lesions or palpable masses. Trachea-midline. Thyroid Gland Characteristics - normal size and consistency.  Eye Eyeball - Bilateral-Extraocular movements intact. Sclera/Conjunctiva - Bilateral-No scleral icterus.  Chest and Lung Exam Chest and lung exam reveals -quiet, even and easy respiratory effort with no use of accessory muscles and on auscultation, normal breath sounds, no adventitious sounds and normal vocal resonance. Inspection Chest Wall - Normal. Back - normal.  Cardiovascular Cardiovascular examination reveals -normal heart sounds, regular rate and rhythm with no murmurs and normal pedal pulses bilaterally. Note: Excellent radial and femoral pulses.   Abdomen Inspection Inspection of the abdomen reveals - No Hernias. Skin - Scar - Note: Midline scar from aneurysm surgery. No hernia. Palpation/Percussion Palpation and Percussion of the abdomen reveal - Soft, Non Tender, No Rebound tenderness, No Rigidity (guarding) and No hepatosplenomegaly. Auscultation Auscultation of the abdomen reveals - Bowel sounds normal.  Male Genitourinary Note: Scar right inguinal area well-healed. No hernia or mass in right groin Large left inguinal hernia which extends partly down into the scrotum. I could reduce this is when supine but it took about 30 seconds. He was a little bit tender. No other scrotal mass. Examined supine and standing   Neurologic Neurologic evaluation reveals -alert and oriented x 3 with no impairment of recent or remote memory. Mental Status-Normal.  Musculoskeletal Normal Exam - Left-Upper Extremity Strength Normal and Lower Extremity Strength Normal. Normal Exam - Right-Upper Extremity Strength Normal and Lower Extremity Strength Normal.  Lymphatic Head & Neck  General Head & Neck Lymphatics: Bilateral - Description - Normal. Axillary  General  Axillary Region: Bilateral - Description - Normal. Tenderness - Non Tender. Femoral & Inguinal  Generalized Femoral & Inguinal Lymphatics: Bilateral - Description - Normal. Tenderness -  Non Tender.    Assessment & Plan  LEFT INGUINAL HERNIA (K40.90)   You have a large left inguinal hernia You are having pain You have had some episodes of near incarceration requiring forcible reduction of the hernia There is no evidence of recurrent hernia on the right This will continue to progress until you have something done surgically  We will expedite surgery to repair your left inguinal hernia You will be admitted to the hospital overnight observation you will be immediately referred to your cardiologist for cardiac risk assessment and clearance  Discontinue your aspirin 3 days prior to surgery if okay with your cardiologist  . CORONARY ARTERY DISEASE, OCCLUSIVE (I25.10) HISTORY OF MYOCARDIAL INFARCTION (I25.2) H/O ABDOMINAL AORTIC ANEURYSM REPAIR (Z98.890) HYPERTENSION, ESSENTIAL (I10) HISTORY OF RIGHT INGUINAL HERNIA REPAIR ([phone])    Edsel Petrin. Dalbert Batman, M.D., [facility] General and Minimally invasive Surgery Breast and Colorectal Surgery Office:   [phone] Pager:   [phone]

## 2018-06-30 ENCOUNTER — Encounter (HOSPITAL_COMMUNITY)
Admission: RE | Disposition: A | Discharge status: Home / Self Care | Payer: Self-pay | Source: Ambulatory Visit | Attending: General Surgery

## 2018-06-30 ENCOUNTER — Encounter (HOSPITAL_COMMUNITY): Payer: Self-pay | Admitting: *Deleted

## 2018-06-30 ENCOUNTER — Ambulatory Visit (HOSPITAL_COMMUNITY)
Admission: RE | Admit: 2018-06-30 | Discharge: 2018-07-01 | Disposition: A | Discharge status: Home / Self Care | Payer: Medicare Other | Source: Ambulatory Visit | Attending: General Surgery | Admitting: General Surgery

## 2018-06-30 ENCOUNTER — Other Ambulatory Visit: Payer: Self-pay

## 2018-06-30 ENCOUNTER — Ambulatory Visit (HOSPITAL_COMMUNITY): Payer: Medicare Other | Admitting: Anesthesiology

## 2018-06-30 DIAGNOSIS — D176 Benign lipomatous neoplasm of spermatic cord: Secondary | ICD-10-CM | POA: Diagnosis not present

## 2018-06-30 DIAGNOSIS — I2510 Atherosclerotic heart disease of native coronary artery without angina pectoris: Secondary | ICD-10-CM | POA: Insufficient documentation

## 2018-06-30 DIAGNOSIS — I4510 Unspecified right bundle-branch block: Secondary | ICD-10-CM | POA: Diagnosis not present

## 2018-06-30 DIAGNOSIS — I5022 Chronic systolic (congestive) heart failure: Secondary | ICD-10-CM | POA: Diagnosis not present

## 2018-06-30 DIAGNOSIS — I509 Heart failure, unspecified: Secondary | ICD-10-CM | POA: Insufficient documentation

## 2018-06-30 DIAGNOSIS — K4090 Unilateral inguinal hernia, without obstruction or gangrene, not specified as recurrent: Secondary | ICD-10-CM | POA: Diagnosis not present

## 2018-06-30 DIAGNOSIS — Z87891 Personal history of nicotine dependence: Secondary | ICD-10-CM | POA: Insufficient documentation

## 2018-06-30 DIAGNOSIS — I252 Old myocardial infarction: Secondary | ICD-10-CM | POA: Diagnosis not present

## 2018-06-30 DIAGNOSIS — K219 Gastro-esophageal reflux disease without esophagitis: Secondary | ICD-10-CM | POA: Diagnosis not present

## 2018-06-30 DIAGNOSIS — Z9889 Other specified postprocedural states: Secondary | ICD-10-CM | POA: Insufficient documentation

## 2018-06-30 DIAGNOSIS — G8918 Other acute postprocedural pain: Secondary | ICD-10-CM | POA: Diagnosis not present

## 2018-06-30 DIAGNOSIS — I110 Hypertensive heart disease with heart failure: Secondary | ICD-10-CM | POA: Insufficient documentation

## 2018-06-30 DIAGNOSIS — Z79899 Other long term (current) drug therapy: Secondary | ICD-10-CM | POA: Diagnosis not present

## 2018-06-30 HISTORY — DX: Exudative age-related macular degeneration, left eye, stage unspecified: H35.3220

## 2018-06-30 HISTORY — PX: INGUINAL HERNIA REPAIR: SHX194

## 2018-06-30 HISTORY — PX: INSERTION OF MESH: SHX5868

## 2018-06-30 HISTORY — PX: INGUINAL HERNIA REPAIR: SUR1180

## 2018-06-30 HISTORY — DX: Unilateral inguinal hernia, without obstruction or gangrene, not specified as recurrent: K40.90

## 2018-06-30 HISTORY — DX: Pneumonia, unspecified organism: J18.9

## 2018-06-30 HISTORY — DX: Unspecified osteoarthritis, unspecified site: M19.90

## 2018-06-30 LAB — COMPREHENSIVE METABOLIC PANEL
ALT: 12 U/L (ref 0–44)
AST: 18 U/L (ref 15–41)
Albumin: 3.9 g/dL (ref 3.5–5.0)
Alkaline Phosphatase: 48 U/L (ref 38–126)
Anion gap: 8 (ref 5–15)
BUN: 11 mg/dL (ref 8–23)
CO2: 27 mmol/L (ref 22–32)
Calcium: 8.6 mg/dL — ABNORMAL LOW (ref 8.9–10.3)
Chloride: 102 mmol/L (ref 98–111)
Creatinine, Ser: 1.08 mg/dL (ref 0.61–1.24)
GFR calc Af Amer: >60 mL/min (ref >60)
GFR calc non Af Amer: 58 mL/min — ABNORMAL LOW (ref >60)
Glucose, Bld: 105 mg/dL — ABNORMAL HIGH (ref 70–99)
Potassium: 4.2 mmol/L (ref 3.5–5.1)
Sodium: 137 mmol/L (ref 135–145)
Total Bilirubin: 1.4 mg/dL — ABNORMAL HIGH (ref 0.3–1.2)
Total Protein: 6.7 g/dL (ref 6.5–8.1)

## 2018-06-30 LAB — CBC WITH DIFFERENTIAL/PLATELET
Abs Immature Granulocytes: 0 10*3/uL (ref 0.0–0.1)
Basophils Absolute: 0.1 10*3/uL (ref 0.0–0.1)
Basophils Relative: 1 %
Eosinophils Absolute: 0.2 10*3/uL (ref 0.0–0.7)
Eosinophils Relative: 3 %
HCT: 44 % (ref 39.0–52.0)
Hemoglobin: 14 g/dL (ref 13.0–17.0)
Immature Granulocytes: 1 %
Lymphocytes Relative: 20 %
Lymphs Abs: 1.4 10*3/uL (ref 0.7–4.0)
MCH: 31.8 pg (ref 26.0–34.0)
MCHC: 31.8 g/dL (ref 30.0–36.0)
MCV: 100 fL (ref 78.0–100.0)
Monocytes Absolute: 0.8 10*3/uL (ref 0.1–1.0)
Monocytes Relative: 13 %
Neutro Abs: 4.2 10*3/uL (ref 1.7–7.7)
Neutrophils Relative %: 62 %
Platelets: 147 10*3/uL — ABNORMAL LOW (ref 150–400)
RBC: 4.4 MIL/uL (ref 4.22–5.81)
RDW: 13.9 % (ref 11.5–15.5)
WBC: 6.7 10*3/uL (ref 4.0–10.5)

## 2018-06-30 SURGERY — REPAIR, HERNIA, INGUINAL, ADULT
Anesthesia: Regional | Site: Inguinal | Laterality: Left

## 2018-06-30 MED ORDER — GABAPENTIN 300 MG PO CAPS
300.0000 mg | ORAL_CAPSULE | Freq: Two times a day (BID) | ORAL | Status: DC
  Administered 2018-06-30 – 2018-07-01 (×2): 300 mg via ORAL
  Filled 2018-06-30 (×2): qty 1

## 2018-06-30 MED ORDER — CHLORHEXIDINE GLUCONATE CLOTH 2 % EX PADS: 6.0000 | MEDICATED_PAD | Freq: Once | CUTANEOUS | Status: DC

## 2018-06-30 MED ORDER — GEMFIBROZIL 600 MG PO TABS
600.0000 mg | ORAL_TABLET | Freq: Every day | ORAL | Status: DC
  Filled 2018-06-30: qty 1

## 2018-06-30 MED ORDER — FENTANYL CITRATE (PF) 100 MCG/2ML IJ SOLN
INTRAMUSCULAR | Status: AC
Start: 2018-06-30 — End: 2018-06-30
  Administered 2018-06-30: 50 ug via INTRAVENOUS
  Filled 2018-06-30: qty 2

## 2018-06-30 MED ORDER — LACTATED RINGERS IV SOLN
INTRAVENOUS | Status: DC
  Administered 2018-06-30: 18:00:00 via INTRAVENOUS

## 2018-06-30 MED ORDER — SUGAMMADEX SODIUM 200 MG/2ML IV SOLN
INTRAVENOUS | Status: DC | PRN
  Administered 2018-06-30: 133.4 mg via INTRAVENOUS

## 2018-06-30 MED ORDER — ENOXAPARIN SODIUM 40 MG/0.4ML ~~LOC~~ SOLN: 40.0000 mg | SUBCUTANEOUS | Status: DC

## 2018-06-30 MED ORDER — LACTATED RINGERS IV SOLN
INTRAVENOUS | Status: DC | PRN
  Administered 2018-06-30 (×2): via INTRAVENOUS

## 2018-06-30 MED ORDER — PANTOPRAZOLE SODIUM 40 MG PO TBEC
40.0000 mg | DELAYED_RELEASE_TABLET | Freq: Every day | ORAL | Status: DC
  Administered 2018-07-01: 40 mg via ORAL
  Filled 2018-06-30: qty 1

## 2018-06-30 MED ORDER — FENTANYL CITRATE (PF) 100 MCG/2ML IJ SOLN
INTRAMUSCULAR | Status: AC
  Filled 2018-06-30: qty 2

## 2018-06-30 MED ORDER — 0.9 % SODIUM CHLORIDE (POUR BTL) OPTIME
TOPICAL | Status: DC | PRN
  Administered 2018-06-30: 1000 mL

## 2018-06-30 MED ORDER — HYDROMORPHONE HCL 1 MG/ML IJ SOLN: 0.5000 mg | INTRAMUSCULAR | Status: DC | PRN

## 2018-06-30 MED ORDER — ONDANSETRON HCL 4 MG/2ML IJ SOLN
INTRAMUSCULAR | Status: AC
  Filled 2018-06-30: qty 2

## 2018-06-30 MED ORDER — ONDANSETRON HCL 4 MG/2ML IJ SOLN: 4.0000 mg | Freq: Once | INTRAMUSCULAR | Status: DC | PRN

## 2018-06-30 MED ORDER — FENTANYL CITRATE (PF) 100 MCG/2ML IJ SOLN
25.0000 ug | INTRAMUSCULAR | Status: DC | PRN
  Administered 2018-06-30: 25 ug via INTRAVENOUS

## 2018-06-30 MED ORDER — BUPIVACAINE HCL (PF) 0.25 % IJ SOLN
INTRAMUSCULAR | Status: DC | PRN
  Administered 2018-06-30: 20 mL

## 2018-06-30 MED ORDER — MECLIZINE HCL 25 MG PO TABS
25.0000 mg | ORAL_TABLET | Freq: Two times a day (BID) | ORAL | Status: DC | PRN
  Filled 2018-06-30: qty 1

## 2018-06-30 MED ORDER — ONDANSETRON 4 MG PO TBDP: 4.0000 mg | ORAL_TABLET | Freq: Four times a day (QID) | ORAL | Status: DC | PRN

## 2018-06-30 MED ORDER — LORATADINE 10 MG PO TABS: 10.0000 mg | ORAL_TABLET | Freq: Every day | ORAL | Status: DC | PRN

## 2018-06-30 MED ORDER — SENNA 8.6 MG PO TABS
1.0000 | ORAL_TABLET | Freq: Two times a day (BID) | ORAL | Status: DC
  Administered 2018-06-30 – 2018-07-01 (×2): 8.6 mg via ORAL
  Filled 2018-06-30 (×2): qty 1

## 2018-06-30 MED ORDER — TIMOLOL MALEATE 0.5 % OP SOLN
1.0000 [drp] | Freq: Two times a day (BID) | OPHTHALMIC | Status: DC
  Administered 2018-06-30 – 2018-07-01 (×2): 1 [drp] via OPHTHALMIC
  Filled 2018-06-30: qty 5

## 2018-06-30 MED ORDER — BUPIVACAINE LIPOSOME 1.3 % IJ SUSP
INTRAMUSCULAR | Status: DC | PRN
  Administered 2018-06-30: 20 mL via PERINEURAL

## 2018-06-30 MED ORDER — SIMVASTATIN 10 MG PO TABS
10.0000 mg | ORAL_TABLET | Freq: Every day | ORAL | Status: DC
  Administered 2018-06-30: 10 mg via ORAL
  Filled 2018-06-30: qty 1

## 2018-06-30 MED ORDER — HYDROCODONE-ACETAMINOPHEN 5-325 MG PO TABS: 1.0000 | ORAL_TABLET | Freq: Four times a day (QID) | ORAL | Status: DC | PRN

## 2018-06-30 MED ORDER — DORZOLAMIDE HCL 2 % OP SOLN
1.0000 [drp] | Freq: Two times a day (BID) | OPHTHALMIC | Status: DC
  Administered 2018-06-30 – 2018-07-01 (×2): 1 [drp] via OPHTHALMIC
  Filled 2018-06-30: qty 10

## 2018-06-30 MED ORDER — ONDANSETRON HCL 4 MG/2ML IJ SOLN
INTRAMUSCULAR | Status: DC | PRN
  Administered 2018-06-30: 4 mg via INTRAVENOUS

## 2018-06-30 MED ORDER — PROPOFOL 10 MG/ML IV BOLUS
INTRAVENOUS | Status: DC | PRN
  Administered 2018-06-30: 50 mg via INTRAVENOUS
  Administered 2018-06-30: 150 mg via INTRAVENOUS

## 2018-06-30 MED ORDER — ROCURONIUM BROMIDE 100 MG/10ML IV SOLN
INTRAVENOUS | Status: DC | PRN
  Administered 2018-06-30: 30 mg via INTRAVENOUS

## 2018-06-30 MED ORDER — LIDOCAINE 2% (20 MG/ML) 5 ML SYRINGE
INTRAMUSCULAR | Status: AC
  Filled 2018-06-30: qty 5

## 2018-06-30 MED ORDER — DEXAMETHASONE SODIUM PHOSPHATE 10 MG/ML IJ SOLN
INTRAMUSCULAR | Status: DC | PRN
  Administered 2018-06-30: 10 mg via INTRAVENOUS

## 2018-06-30 MED ORDER — ATENOLOL 50 MG PO TABS
50.0000 mg | ORAL_TABLET | Freq: Every day | ORAL | Status: DC
  Administered 2018-07-01: 50 mg via ORAL
  Filled 2018-06-30: qty 1

## 2018-06-30 MED ORDER — FENTANYL CITRATE (PF) 100 MCG/2ML IJ SOLN
50.0000 ug | Freq: Once | INTRAMUSCULAR | Status: AC
  Administered 2018-06-30: 50 ug via INTRAVENOUS

## 2018-06-30 MED ORDER — LIDOCAINE HCL (CARDIAC) PF 100 MG/5ML IV SOSY
PREFILLED_SYRINGE | INTRAVENOUS | Status: DC | PRN
  Administered 2018-06-30: 30 mg via INTRAVENOUS

## 2018-06-30 MED ORDER — AZELASTINE HCL 0.1 % NA SOLN
2.0000 | Freq: Two times a day (BID) | NASAL | Status: DC
  Administered 2018-06-30 – 2018-07-01 (×2): 2 via NASAL
  Filled 2018-06-30: qty 30

## 2018-06-30 MED ORDER — MELATONIN 3 MG PO TABS
3.0000 mg | ORAL_TABLET | Freq: Every evening | ORAL | Status: DC | PRN
  Administered 2018-06-30: 3 mg via ORAL
  Filled 2018-06-30 (×2): qty 1

## 2018-06-30 MED ORDER — PHENYLEPHRINE HCL 10 MG/ML IJ SOLN
INTRAMUSCULAR | Status: DC | PRN
  Administered 2018-06-30: 50 ug/min via INTRAVENOUS

## 2018-06-30 MED ORDER — FENTANYL CITRATE (PF) 250 MCG/5ML IJ SOLN
INTRAMUSCULAR | Status: AC
  Filled 2018-06-30: qty 5

## 2018-06-30 MED ORDER — DORZOLAMIDE HCL-TIMOLOL MAL PF 22.3-6.8 MG/ML OP SOLN: 1.0000 [drp] | Freq: Two times a day (BID) | OPHTHALMIC | Status: DC

## 2018-06-30 MED ORDER — AMLODIPINE BESYLATE 2.5 MG PO TABS
2.5000 mg | ORAL_TABLET | Freq: Every day | ORAL | Status: DC
  Administered 2018-07-01: 2.5 mg via ORAL
  Filled 2018-06-30: qty 1

## 2018-06-30 MED ORDER — ROCURONIUM BROMIDE 10 MG/ML (PF) SYRINGE
PREFILLED_SYRINGE | INTRAVENOUS | Status: AC
  Filled 2018-06-30: qty 10

## 2018-06-30 MED ORDER — FENTANYL CITRATE (PF) 100 MCG/2ML IJ SOLN
INTRAMUSCULAR | Status: DC | PRN
  Administered 2018-06-30: 100 ug via INTRAVENOUS

## 2018-06-30 MED ORDER — DEXAMETHASONE SODIUM PHOSPHATE 10 MG/ML IJ SOLN
INTRAMUSCULAR | Status: AC
  Filled 2018-06-30: qty 1

## 2018-06-30 MED ORDER — ONDANSETRON HCL 4 MG/2ML IJ SOLN: 4.0000 mg | Freq: Four times a day (QID) | INTRAMUSCULAR | Status: DC | PRN

## 2018-06-30 MED ORDER — BUPIVACAINE-EPINEPHRINE (PF) 0.5% -1:200000 IJ SOLN
INTRAMUSCULAR | Status: AC
  Filled 2018-06-30: qty 30

## 2018-06-30 MED ORDER — DOCUSATE SODIUM 100 MG PO CAPS: 100.0000 mg | ORAL_CAPSULE | Freq: Every day | ORAL | Status: DC | PRN

## 2018-06-30 MED ORDER — PHENYLEPHRINE 40 MCG/ML (10ML) SYRINGE FOR IV PUSH (FOR BLOOD PRESSURE SUPPORT)
PREFILLED_SYRINGE | INTRAVENOUS | Status: AC
  Filled 2018-06-30: qty 10

## 2018-06-30 MED ORDER — TRAMADOL HCL 50 MG PO TABS
50.0000 mg | ORAL_TABLET | Freq: Two times a day (BID) | ORAL | Status: DC | PRN
  Administered 2018-06-30: 50 mg via ORAL
  Filled 2018-06-30: qty 1

## 2018-06-30 SURGICAL SUPPLY — 45 items
BLADE CLIPPER SURG (BLADE) ×3 IMPLANT
BLADE SURG 10 STRL SS (BLADE) ×3 IMPLANT
BLADE SURG 15 STRL LF DISP TIS (BLADE) ×1 IMPLANT
BLADE SURG 15 STRL SS (BLADE) ×3
CANISTER SUCT 3000ML PPV (MISCELLANEOUS) ×3 IMPLANT
CHLORAPREP W/TINT 26ML (MISCELLANEOUS) ×3 IMPLANT
COVER SURGICAL LIGHT HANDLE (MISCELLANEOUS) ×3 IMPLANT
DERMABOND ADVANCED (GAUZE/BANDAGES/DRESSINGS) ×2
DERMABOND ADVANCED .7 DNX12 (GAUZE/BANDAGES/DRESSINGS) ×1 IMPLANT
DRAIN PENROSE 1/2X12 LTX STRL (WOUND CARE) ×3 IMPLANT
DRAPE LAPAROTOMY TRNSV 102X78 (DRAPE) ×3 IMPLANT
DRAPE UTILITY XL STRL (DRAPES) ×3 IMPLANT
ELECT CAUTERY BLADE 6.4 (BLADE) ×3 IMPLANT
ELECT REM PT RETURN 9FT ADLT (ELECTROSURGICAL) ×3
ELECTRODE REM PT RTRN 9FT ADLT (ELECTROSURGICAL) ×1 IMPLANT
GLOVE EUDERMIC 7 POWDERFREE (GLOVE) ×3 IMPLANT
GOWN STRL REUS W/ TWL LRG LVL3 (GOWN DISPOSABLE) ×1 IMPLANT
GOWN STRL REUS W/ TWL XL LVL3 (GOWN DISPOSABLE) ×1 IMPLANT
GOWN STRL REUS W/TWL LRG LVL3 (GOWN DISPOSABLE) ×2
GOWN STRL REUS W/TWL XL LVL3 (GOWN DISPOSABLE) ×2
KIT BASIN OR (CUSTOM PROCEDURE TRAY) ×3 IMPLANT
KIT TURNOVER KIT B (KITS) ×3 IMPLANT
MESH ULTRAPRO 3X6 7.6X15CM (Mesh General) ×3 IMPLANT
NEEDLE HYPO 25GX1X1/2 BEV (NEEDLE) ×3 IMPLANT
NS IRRIG 1000ML POUR BTL (IV SOLUTION) ×3 IMPLANT
PACK SURGICAL SETUP 50X90 (CUSTOM PROCEDURE TRAY) ×3 IMPLANT
PAD ARMBOARD 7.5X6 YLW CONV (MISCELLANEOUS) ×3 IMPLANT
PENCIL BUTTON HOLSTER BLD 10FT (ELECTRODE) ×3 IMPLANT
SPONGE LAP 18X18 X RAY DECT (DISPOSABLE) ×3 IMPLANT
SUT MNCRL AB 4-0 PS2 18 (SUTURE) ×3 IMPLANT
SUT PROLENE 2 0 CT2 30 (SUTURE) ×9 IMPLANT
SUT SILK 2 0 (SUTURE) ×3
SUT SILK 2 0 SH (SUTURE) ×3 IMPLANT
SUT SILK 2-0 18XBRD TIE 12 (SUTURE) ×1 IMPLANT
SUT VIC AB 2-0 BRD 54 (SUTURE) ×3 IMPLANT
SUT VIC AB 2-0 CT1 27 (SUTURE) ×3
SUT VIC AB 2-0 CT1 36 (SUTURE) ×3 IMPLANT
SUT VIC AB 2-0 CT1 TAPERPNT 27 (SUTURE) ×1 IMPLANT
SUT VIC AB 3-0 SH 27 (SUTURE) ×3
SUT VIC AB 3-0 SH 27XBRD (SUTURE) ×1 IMPLANT
SYR BULB 3OZ (MISCELLANEOUS) ×3 IMPLANT
SYR CONTROL 10ML LL (SYRINGE) ×3 IMPLANT
TUBE CONNECTING 12'X1/4 (SUCTIONS) ×1
TUBE CONNECTING 12X1/4 (SUCTIONS) ×2 IMPLANT
YANKAUER SUCT BULB TIP NO VENT (SUCTIONS) ×3 IMPLANT

## 2018-06-30 NOTE — Anesthesia Preprocedure Evaluation (Addendum)
Anesthesia Evaluation  Patient identified by MRN, date of birth, ID band Patient awake    Reviewed: Allergy & Precautions, NPO status , Patient's Chart, lab work & pertinent test results  Airway Mallampati: II  TM Distance: >3 FB Neck ROM: Full    Dental  (+) Missing,    Pulmonary former smoker,    Pulmonary exam normal breath sounds clear to auscultation       Cardiovascular hypertension, Pt. on medications and Pt. on home beta blockers + CAD, + Past MI and +CHF  Normal cardiovascular exam Rhythm:Regular Rate:Normal  ECG: NSR, incomplete RBBB, rate 70  Myocardial perfusion 2017 The left ventricular ejection fraction is moderately decreased (30-44%). Nuclear stress EF: 33%. There is severe diffuse hypokinesis and inferolateral akinesis. Defect 1: There is a medium fixed defect of moderate severity present in the basal anterolateral, mid anterolateral and apical lateral location. This is consistent with infarct. No ischemia noted. Defect 2: There is a fixed medium defect of severe severity present in the basal inferior, basal inferolateral, mid inferior and mid inferolateral location. This is consistent with infarct. No ischemia noted. Defect 3: There is a small fixed defect of mild severity present in the apical septal location. This is consistent with infarct. No ischemia noted. This is a high risk study. Findings consistent with prior myocardial infarction.  Dr. Ottie Glazier is his cardiologist.   Neuro/Psych negative neurological ROS  negative psych ROS   GI/Hepatic Neg liver ROS, GERD  Medicated and Controlled,  Endo/Other  negative endocrine ROS  Renal/GU negative Renal ROS     Musculoskeletal negative musculoskeletal ROS (+)   Abdominal   Peds  Hematology HLD   Anesthesia Other Findings Left inguinal hernia  Reproductive/Obstetrics                            Anesthesia  Physical Anesthesia Plan  ASA: III  Anesthesia Plan: General and Regional   Post-op Pain Management: GA combined w/ Regional for post-op pain   Induction: Intravenous  PONV Risk Score and Plan: 2 and Ondansetron, Dexamethasone and Treatment may vary due to age or medical condition  Airway Management Planned: Oral ETT  Additional Equipment:   Intra-op Plan:   Post-operative Plan: Extubation in OR  Informed Consent: I have reviewed the patients History and Physical, chart, labs and discussed the procedure including the risks, benefits and alternatives for the proposed anesthesia with the patient or authorized representative who has indicated his/her understanding and acceptance.   Dental advisory given  Plan Discussed with: CRNA and Surgeon  Anesthesia Plan Comments:       Anesthesia Quick Evaluation

## 2018-06-30 NOTE — Transfer of Care (Signed)
Immediate Anesthesia Transfer of Care Note  Patient: Christopher Hogan  Procedure(s) Performed: OPEN LEFT INGUINAL HERNIA REPAIR (Left Inguinal) INSERTION OF MESH (Left Inguinal)  Patient Location: PACU  Anesthesia Type:General and GA combined with regional for post-op pain  Level of Consciousness: drowsy  Airway & Oxygen Therapy: Patient Spontanous Breathing and Patient connected to nasal cannula oxygen  Post-op Assessment: Report given to RN and Post -op Vital signs reviewed and stable  Post vital signs: Reviewed and stable  Last Vitals:  Vitals Value Taken Time  BP 148/64 06/30/2018  4:22 PM  Temp    Pulse 58 06/30/2018  4:24 PM  Resp 17 06/30/2018  4:24 PM  SpO2 99 % 06/30/2018  4:24 PM  Vitals shown include unvalidated device data.  Last Pain:  Vitals:   06/30/18 1258  TempSrc:   PainSc: 1          Complications: No apparent anesthesia complications

## 2018-06-30 NOTE — Op Note (Addendum)
Patient Name:           Christopher Hogan   Date of Surgery:        06/30/2018  Pre op Diagnosis:      Left inguinal hernia  Post op Diagnosis:    Left inguinal hernia  Procedure:                 Open repair of left inguinal hernia with meshKarl Pock repair)  Surgeon:                     Edsel Petrin. Dalbert Batman, M.D., FACS  Assistant:                      OR staff  Operative Indications:   This is a very pleasant man, 84 years old who is brought to the operating room for repair of symptomatic left inguinal hernia.   He is referred by Dr. Larose Kells for urgent evaluation of large left inguinal hernia with recent incarceration. Dr. Ottie Glazier is his cardiologist.      Dr. Sheilah Pigeon repaired his right inguinal hernia many years ago and that repair has held. He has had a progressive bulge in his left groin. He's had 2 recent episodes of incarceration requiring forcible reduction 1 event lead to ER visit on July 2. He's had no nausea or vomiting. He would like to have this repaired.       Past history consistent with laparoscopic cholecystectomy by me in the early 90s. Right inguinal hernia repair by Dr. Sheilah Pigeon many years ago. Abdominal aortic aneurysm repair with good recovery. Coronary artery disease with angioplasty 1989 by Dr. Olevia Perches. Myocardial infarction. Undefined congestive heart failure. Hypertension.     Exam revealed a large left inguinal hernia. I had to forcibly reduce this when supine but it was completely reducible. He will be scheduled for open repair of left inguinal hernia with mesh at [facility] hospital. . He agrees with this plan.   Operative Findings:       He had a large direct left inguinal hernia.  The size of an orange.  He had a small lipoma associated with the cord structures.  He did not have an indirect hernia.  Procedure in Detail:          Left TAPP block with Exparel was performed by the anesthesiologist.  General endotracheal anesthesia was induced.  The abdomen  and groin and genitalia were prepped and draped in a sterile fashion.  Surgical timeout was performed.  Intravenous antibiotics were given.      A transverse incision was made in the left groin over the inguinal canal.  Dissection was carried down to the external oblique which was exposed and incised in the direction of its fibers, opening up the external inguinal ring.  The external oblique  was dissected away from the underlying tissues and self-retaining retractors were placed.  One sensory nerve associated with the cord was traced back to its emergence from the muscle laterally, clamped, divided, and ligated with 2-0 silk tie.  The redundant nerve medially was resected.  The cord structures were mobilized and encircled with a Penrose drain.  I dissected his large, direct hernia away from the cord structures.  We then just simply reduce to this and oversewed it with a running suture of 2-0 Vicryl to hold it reduced during the repair.  I dissected the cord structures.  A small lipoma was dissected away from the cord structures  and simply reduced.  There was no indirect hernia.  The ring was tightened laterally with a Vicryl suture.     The floor of the inguinal canal was repaired and reinforced with an onlay graft of ultra pro mesh.  A 3 inch x 6 inch piece of mesh was used.  This was slightly trimmed at the corners to accommodate the anatomy.  The mesh was sutured in place with running sutures of 2-0 Prolene and interrupted mattress sutures of 2-0 Prolene.  The mesh was sutured so as to overlap the pubic tubercle generously.  The mesh was sutured in place along the inguinal ligament inferiorly.  Medially, superiorly, and superior laterally several mattress sutures of Prolene were placed.  The mesh was incised laterally so as to wrap around the cord structures at the internal ring.  Prolene sutures were placed laterally to complete the repair.  This provided very secure coverage both medial and lateral but  allowed a fingertip opening for the cord structures.  Hemostasis was excellent.  The wound was irrigated.  The external oblique was closed with a running suture of 2-0 Vicryl.  Scarpa's fascia was closed with 3-0 Vicryl and the skin closed with a running subcuticular suture of 4-0 Monocryl and Dermabond.  Icepack was placed.  The patient tolerated the procedure well and was taken to PACU in stable condition.  EBL 10 cc.  Counts correct.  Complications none    Addendum: I logged known to the [employer] and reviewed his prescription medication history    Ahnaf Caponi M. Dalbert Batman, M.D., FACS General and Minimally Invasive Surgery Breast and Colorectal Surgery  06/30/2018 4:17 PM

## 2018-06-30 NOTE — Interval H&P Note (Signed)
History and Physical Interval Note:  06/30/2018 1:59 PM  Christopher Hogan  has presented today for surgery, with the diagnosis of Left inguinal hernia  The various methods of treatment have been discussed with the patient and family. After consideration of risks, benefits and other options for treatment, the patient has consented to  Procedure(s): OPEN LEFT INGUINAL HERNIA REPAIR (Left) INSERTION OF MESH (Left) as a surgical intervention .  The patient's history has been reviewed, patient examined, no change in status, stable for surgery.  I have reviewed the patient's chart and labs.  Questions were answered to the patient's satisfaction.     [name]

## 2018-06-30 NOTE — Anesthesia Postprocedure Evaluation (Signed)
Anesthesia Post Note  Patient: EMRAH ARIOLA  Procedure(s) Performed: OPEN LEFT INGUINAL HERNIA REPAIR (Left Inguinal) INSERTION OF MESH (Left Inguinal)     Patient location during evaluation: PACU Anesthesia Type: Regional and General Level of consciousness: awake Pain management: pain level controlled Vital Signs Assessment: post-procedure vital signs reviewed and stable Respiratory status: spontaneous breathing, nonlabored ventilation, respiratory function stable and patient connected to nasal cannula oxygen Cardiovascular status: blood pressure returned to baseline and stable Postop Assessment: no apparent nausea or vomiting Anesthetic complications: no    Last Vitals:  Vitals:   06/30/18 1655 06/30/18 1708  BP: (!) 146/69 (!) 164/73  Pulse: (!) 58 62  Resp: 16 16  Temp:  (!) 36.3 C  SpO2: 99% 99%    Last Pain:  Vitals:   06/30/18 1700  TempSrc:   PainSc: 5                  Jonathandavid Marlett P Quilla Freeze

## 2018-06-30 NOTE — Anesthesia Procedure Notes (Signed)
Procedure Name: Intubation Date/Time: 06/30/2018 2:15 PM Performed by: Eligha Bridegroom, CRNA Pre-anesthesia Checklist: Patient identified, Emergency Drugs available, Suction available, Patient being monitored and Timeout performed Patient Re-evaluated:Patient Re-evaluated prior to induction Oxygen Delivery Method: Circle system utilized Preoxygenation: Pre-oxygenation with 100% oxygen Induction Type: IV induction Ventilation: Mask ventilation without difficulty and Oral airway inserted - appropriate to patient size Laryngoscope Size: Mac and 3 Grade View: Grade I Tube type: Oral Tube size: 7.5 mm Number of attempts: 1 Airway Equipment and Method: Stylet Placement Confirmation: ETT inserted through vocal cords under direct vision,  positive ETCO2 and breath sounds checked- equal and bilateral Secured at: 21 cm Tube secured with: Tape Dental Injury: Teeth and Oropharynx as per pre-operative assessment

## 2018-06-30 NOTE — Anesthesia Procedure Notes (Signed)
Anesthesia Regional Block: TAP block   Pre-Anesthetic Checklist: ,, timeout performed, Correct Patient, Correct Site, Correct Laterality, Correct Procedure,, site marked, risks and benefits discussed, Surgical consent,  Pre-op evaluation,  At surgeon's request and post-op pain management  Laterality: Left  Prep: chloraprep       Needles:  Injection technique: Single-shot  Needle Type: Echogenic Stimulator Needle     Needle Length: 10cm  Needle Gauge: 21     Additional Needles:   Procedures:,,,, ultrasound used (permanent image in chart),,,,  Narrative:  Start time: 06/30/2018 2:40 PM End time: 06/30/2018 2:50 PM Injection made incrementally with aspirations every 5 mL.  Performed by: Personally  Anesthesiologist: Murvin Natal, MD  Additional Notes: Functioning IV was confirmed and monitors were applied.  A 121mm 21ga P[place]unk echogenic stimulator needle was used. Sterile prep, hand hygiene and sterile gloves were used.  Negative aspiration and negative test dose prior to incremental administration of local anesthetic. The patient tolerated the procedure well.

## 2018-06-30 NOTE — Discharge Instructions (Signed)
[facility] _______[facility]   INGUINAL HERNIA REPAIR: POST OP INSTRUCTIONS  Always review your discharge instruction sheet given to you by the facility where your surgery was performed. IF YOU HAVE DISABILITY OR FAMILY LEAVE FORMS, YOU MUST BRING THEM TO THE OFFICE FOR PROCESSING.   DO NOT GIVE THEM TO YOUR DOCTOR.  1. A  prescription for pain medication may be given to you upon discharge.  Take your pain medication as prescribed, if needed.  If narcotic pain medicine is not needed, then you may take acetaminophen (Tylenol) or ibuprofen (Advil) as needed. 2. Take your usually prescribed medications unless otherwise directed. If you need a refill on your pain medication, please contact your pharmacy.  They will contact our office to request authorization. Prescriptions will not be filled after 5 pm or on week-ends. 3. You should follow a light diet the first 24 hours after arrival home, such as soup and crackers, etc.  Be sure to include lots of fluids daily.  Resume your normal diet the day after surgery. 4.Most patients will experience some swelling and bruising  in the groin and scrotum.  Ice packs and reclining will help.  Swelling and bruising can take several days to resolve.  6. It is common to experience some constipation if taking pain medication after surgery.  Increasing fluid intake and taking a stool softener (such as Colace) will usually help or prevent this problem from occurring.  A mild laxative (Milk of Magnesia or Miralax) should be taken according to package directions if there are no bowel movements after 48 hours. 7. Unless discharge instructions indicate otherwise, you may remove your bandages 24-48 hours after surgery, and you may shower at that time.  You may have steri-strips (small skin tapes) in place directly over the incision.  These strips should be left on the skin for 7-10 days.  If your surgeon used skin glue on the incision, you may shower in 24 hours.  The  glue will flake off over the next 2-3 weeks.  Any sutures or staples will be removed at the office during your follow-up visit. 8. ACTIVITIES:  You may resume regular (light) daily activities beginning the next day--such as daily self-care, walking, climbing stairs--gradually increasing activities as tolerated.  You may have sexual intercourse when it is comfortable.  Refrain from any heavy lifting or straining until approved by your doctor.  a.You may drive when you are no longer taking prescription pain medication, you can comfortably wear a seatbelt, and you can safely maneuver your car and apply brakes. b.RETURN TO WORK:   _____________________________________________  9.You should see your doctor in the office for a follow-up appointment approximately 2-3 weeks after your surgery.  Make sure that you call for this appointment within a day or two after you arrive home to insure a convenient appointment time. 10.OTHER INSTRUCTIONS: ___Walk as much as possible.  Take a walk out-of-doors daily if possible.  No lifting more than 15 pounds.  ______________________    _____________________________________  WHEN TO CALL YOUR DOCTOR: 1. Fever over 101.0 2. Inability to urinate 3. Nausea and/or vomiting 4. Extreme swelling or bruising 5. Continued bleeding from incision. 6. Increased pain, redness, or drainage from the incision  The clinic staff is available to answer your questions during regular business hours.  Please dont hesitate to call and ask to speak to one of the nurses for clinical concerns.  If you have a medical emergency, go to the nearest emergency room or call  911.  A surgeon from [facility] is always on call at the hospital   [address] [place], [place], [place]  [address] ?  [address], [place], [place]   [address] [phone] ? [phone] ? FAX [phone] Web site: [email or web address]

## 2018-07-01 ENCOUNTER — Encounter (HOSPITAL_COMMUNITY): Payer: Self-pay | Admitting: General Surgery

## 2018-07-01 DIAGNOSIS — K4090 Unilateral inguinal hernia, without obstruction or gangrene, not specified as recurrent: Secondary | ICD-10-CM | POA: Diagnosis not present

## 2018-07-01 DIAGNOSIS — I2510 Atherosclerotic heart disease of native coronary artery without angina pectoris: Secondary | ICD-10-CM | POA: Diagnosis not present

## 2018-07-01 DIAGNOSIS — I509 Heart failure, unspecified: Secondary | ICD-10-CM | POA: Diagnosis not present

## 2018-07-01 DIAGNOSIS — D176 Benign lipomatous neoplasm of spermatic cord: Secondary | ICD-10-CM | POA: Diagnosis not present

## 2018-07-01 DIAGNOSIS — I110 Hypertensive heart disease with heart failure: Secondary | ICD-10-CM | POA: Diagnosis not present

## 2018-07-01 DIAGNOSIS — I252 Old myocardial infarction: Secondary | ICD-10-CM | POA: Diagnosis not present

## 2018-07-01 MED ORDER — ATENOLOL 50 MG PO TABS: 50.0000 mg | ORAL_TABLET | Freq: Every day | ORAL | 1 refills | Status: DC

## 2018-07-01 MED ORDER — TIMOLOL MALEATE 0.5 % OP SOLN: 1.0000 [drp] | Freq: Two times a day (BID) | OPHTHALMIC | 12 refills | Status: DC

## 2018-07-01 MED ORDER — DORZOLAMIDE HCL 2 % OP SOLN: 1.0000 [drp] | Freq: Two times a day (BID) | OPHTHALMIC | 12 refills | Status: DC

## 2018-07-01 MED ORDER — SENNA 8.6 MG PO TABS: 1.0000 | ORAL_TABLET | Freq: Two times a day (BID) | ORAL | 0 refills | Status: DC

## 2018-07-01 NOTE — Discharge Summary (Signed)
Patient ID: Christopher Hogan [id number] 84 y.o. Aug 18, 1925  Admit date: 06/30/2018  Discharge date and time: 07/01/2018  Admitting Physician: [name]  Discharge Physician: [name]  Admission Diagnoses: Left inguinal hernia  Discharge Diagnoses: Left inguinal hernia                                         CORONARY ARTERY DISEASE, OCCLUSIVE (I25.10) HISTORY OF MYOCARDIAL INFARCTION (I25.2) H/O ABDOMINAL AORTIC ANEURYSM REPAIR (Z98.890) HYPERTENSION, ESSENTIAL (I10) HISTORY OF RIGHT INGUINAL HERNIA REPAIR (Z98.890)   Operations: Procedure(s): OPEN LEFT INGUINAL HERNIA REPAIR INSERTION OF MESH  Admission Condition: good  Discharged Condition: good  Indication for Admission:   This is a very pleasant man, 84 years old who is brought to the operating room for repair of symptomatic left inguinal hernia.   He is referred by Dr. Larose Kells for urgent evaluation of large left inguinal hernia with recent incarceration. Dr. Ottie Glazier is his cardiologist. Dr. Sheilah Pigeon repaired his right inguinal hernia many years ago and that repair has held. He has had a progressive bulge in his left groin. He's had 2 recent episodes of incarceration requiring forcible reduction 1 event lead to ER visit on July 2. He's had no nausea or vomiting. He would like to have this repaired.  Past history consistent with laparoscopic cholecystectomy by me in the early 90s. Right inguinal hernia repair by Dr. Sheilah Pigeon many years ago. Abdominal aortic aneurysm repair with good recovery. Coronary artery disease with angioplasty 1989 by Dr. Olevia Perches. Myocardial infarction. Undefined congestive heart failure. Hypertension. Exam revealed a large left inguinal hernia. I had to forcibly reduce this when supine but it was completely reducible. He will be scheduled for open repair of left inguinal hernia with mesh at [facility] hospital. . He agrees with this plan.    He is admitted overnight  because of advanced age, high risk for urinary retention and fall.  Wife is disabled on a walker.  Hospital Course: On the day of admission the patient was taken to the operating room.  He underwent left TA PP block with Exparel L.  Open repair left inguinal hernia with mesh.  Surgery was uneventful.  On postop day 1 he was doing very well.  He was able to ambulate to the bathroom and void without difficulty.  He was having no pain whatsoever.  He was tolerating oral intake and wanted to go home.  Exam reveals abdomen is soft and nondistended.  Left groin incision looked good without any swelling or hematoma or ecchymoses.    He was given instructions in diet and activities     He was told to use high-dose Tylenol for pain and avoid narcotics and so no prescriptions were given     He was advised to take Senokot twice a day to avoid constipation     I will see him back in 3 weeks  Consults: None  Significant Diagnostic Studies: labs  Treatments: surgery: Open repair left inguinal hernia with mesh  Disposition: Home  Patient Instructions:  Allergies as of 07/01/2018      Reactions   Ace Inhibitors Cough   Aspirin Nausea Only, Other (See Comments)   Full strength only   Spironolactone Rash      Medication List    TAKE these medications   amitriptyline 10 MG tablet Commonly known as:  ELAVIL Take 1 tablet (10  mg total) by mouth at bedtime as needed for sleep.   amLODipine 2.5 MG tablet Commonly known as:  NORVASC Take 1 tablet (2.5 mg total) by mouth daily.   aspirin 81 MG chewable tablet Chew 81 mg by mouth daily.   atenolol 50 MG tablet Commonly known as:  TENORMIN TAKE ONE TABLET BY MOUTH EVERY DAY What changed:    how much to take  how to take this  when to take this  additional instructions   atenolol 50 MG tablet Commonly known as:  TENORMIN Take 1 tablet (50 mg total) by mouth daily. What changed:  You were already taking a medication with the same name, and  this prescription was added. Make sure you understand how and when to take each.   azelastine 0.1 % nasal spray Commonly known as:  ASTELIN Place 2 sprays into both nostrils 2 (two) times daily. Use in each nostril as directed   CLARITIN 10 MG tablet Generic drug:  loratadine Take 10 mg by mouth daily as needed for allergies.   docusate sodium 100 MG capsule Commonly known as:  COLACE Take 100 mg by mouth daily as needed for mild constipation.   dorzolamide 2 % ophthalmic solution Commonly known as:  TRUSOPT Place 1 drop into both eyes 2 (two) times daily.   dorzolamidel-timolol 22.3-6.8 MG/ML Soln ophthalmic solution Commonly known as:  COSOPT Place 1 drop into both eyes 2 (two) times daily.   gemfibrozil 600 MG tablet Commonly known as:  LOPID TAKE ONE TABLET BY MOUTH EVERY DAY   ICAPS AREDS FORMULA PO Take 1 capsule by mouth daily.   meclizine 25 MG tablet Commonly known as:  ANTIVERT Take 1 tablet (25 mg total) by mouth 2 (two) times daily as needed for dizziness.   MELATONIN PO Take 1 tablet by mouth at bedtime as needed (sleep).   multivitamin capsule Take 1 capsule by mouth daily.   omeprazole 20 MG capsule Commonly known as:  PRILOSEC Take 20 mg by mouth daily.   senna 8.6 MG Tabs tablet Commonly known as:  SENOKOT Take 1 tablet (8.6 mg total) by mouth 2 (two) times daily.   simvastatin 10 MG tablet Commonly known as:  ZOCOR Take 1 tablet (10 mg total) by mouth at bedtime.   timolol 0.5 % ophthalmic solution Commonly known as:  TIMOPTIC Place 1 drop into both eyes 2 (two) times daily.   traMADol 50 MG tablet Commonly known as:  ULTRAM Take 1 tablet (50 mg total) by mouth every 6 (six) hours as needed.       Activity: no heavy lifting for 6 weeks Diet: low fat, low cholesterol diet Wound Care: none needed  Follow-up:  With Dr. Dalbert Batman in 3 weeks.  Signed: Edsel Petrin. Dalbert Batman, M.D., FACS General and minimally invasive surgery Breast and  Colorectal Surgery  07/01/2018, 7:37 AM

## 2018-07-01 NOTE — Progress Notes (Signed)
Patient discharged to home with instructions. 

## 2018-07-07 ENCOUNTER — Telehealth: Payer: Self-pay

## 2018-07-07 NOTE — Telephone Encounter (Signed)
07/07/18  Follow up call made to patient. States he is seeing Dr at the  [place] . on tomorrow and his surgeon soon. States he doesn't need an appointment with Dr. Larose Kells right now. Will call if he needs to see him before December.

## 2018-07-10 ENCOUNTER — Encounter (INDEPENDENT_AMBULATORY_CARE_PROVIDER_SITE_OTHER): Payer: Medicare Other | Admitting: Ophthalmology

## 2018-07-10 DIAGNOSIS — H353231 Exudative age-related macular degeneration, bilateral, with active choroidal neovascularization: Secondary | ICD-10-CM

## 2018-07-10 DIAGNOSIS — H35033 Hypertensive retinopathy, bilateral: Secondary | ICD-10-CM | POA: Diagnosis not present

## 2018-07-10 DIAGNOSIS — H43813 Vitreous degeneration, bilateral: Secondary | ICD-10-CM | POA: Diagnosis not present

## 2018-07-10 DIAGNOSIS — I10 Essential (primary) hypertension: Secondary | ICD-10-CM

## 2018-08-06 ENCOUNTER — Encounter (INDEPENDENT_AMBULATORY_CARE_PROVIDER_SITE_OTHER): Payer: Medicare Other | Admitting: Ophthalmology

## 2018-08-06 DIAGNOSIS — H353231 Exudative age-related macular degeneration, bilateral, with active choroidal neovascularization: Secondary | ICD-10-CM

## 2018-08-06 DIAGNOSIS — H43813 Vitreous degeneration, bilateral: Secondary | ICD-10-CM

## 2018-08-06 DIAGNOSIS — I10 Essential (primary) hypertension: Secondary | ICD-10-CM

## 2018-08-06 DIAGNOSIS — H35033 Hypertensive retinopathy, bilateral: Secondary | ICD-10-CM | POA: Diagnosis not present

## 2018-09-03 ENCOUNTER — Encounter (INDEPENDENT_AMBULATORY_CARE_PROVIDER_SITE_OTHER): Payer: Medicare Other | Admitting: Ophthalmology

## 2018-09-03 DIAGNOSIS — H353231 Exudative age-related macular degeneration, bilateral, with active choroidal neovascularization: Secondary | ICD-10-CM | POA: Diagnosis not present

## 2018-09-03 DIAGNOSIS — I10 Essential (primary) hypertension: Secondary | ICD-10-CM | POA: Diagnosis not present

## 2018-09-03 DIAGNOSIS — H35033 Hypertensive retinopathy, bilateral: Secondary | ICD-10-CM

## 2018-09-03 DIAGNOSIS — H43813 Vitreous degeneration, bilateral: Secondary | ICD-10-CM

## 2018-09-04 DIAGNOSIS — H401113 Primary open-angle glaucoma, right eye, severe stage: Secondary | ICD-10-CM | POA: Diagnosis not present

## 2018-09-04 DIAGNOSIS — H353222 Exudative age-related macular degeneration, left eye, with inactive choroidal neovascularization: Secondary | ICD-10-CM | POA: Diagnosis not present

## 2018-09-04 DIAGNOSIS — H04123 Dry eye syndrome of bilateral lacrimal glands: Secondary | ICD-10-CM | POA: Diagnosis not present

## 2018-09-04 DIAGNOSIS — H401121 Primary open-angle glaucoma, left eye, mild stage: Secondary | ICD-10-CM | POA: Diagnosis not present

## 2018-09-04 DIAGNOSIS — H353124 Nonexudative age-related macular degeneration, left eye, advanced atrophic with subfoveal involvement: Secondary | ICD-10-CM | POA: Diagnosis not present

## 2018-09-04 DIAGNOSIS — Z961 Presence of intraocular lens: Secondary | ICD-10-CM | POA: Diagnosis not present

## 2018-09-22 ENCOUNTER — Other Ambulatory Visit: Payer: Self-pay | Admitting: General Surgery

## 2018-09-24 ENCOUNTER — Other Ambulatory Visit: Payer: Self-pay

## 2018-09-24 ENCOUNTER — Encounter (HOSPITAL_BASED_OUTPATIENT_CLINIC_OR_DEPARTMENT_OTHER): Payer: Self-pay | Admitting: *Deleted

## 2018-09-24 NOTE — Progress Notes (Signed)
Patient's chart and cardiac tests reviewed with Dr Gifford Shave, [place] for [facility].

## 2018-09-28 NOTE — H&P (Signed)
Christopher Hogan Location: [facility] Patient #: [id number] DOB: 02-18-1925 Married / Language: English / Race: White Male      History of Present Illness       This is a very pleasant 84 year old man who returns with his wife for two reasons. He wants me to excise the large epidermoid cyst from his presternal area and he wants me to check his left groin incision. Christopher Hogan is his PCP. Dr. Ottie Glazier is his cardiologist. Wife is present throughout the encounter.      On June 30, 2018 he underwent open repair of a very large left inguinal hernia with mesh. He recovered uneventfully. When I saw him in September he had a golf ball sized seroma medially that was asymptomatic. We decided to observe thinking that it would probably resolve. It has essentially resolved. It is less than 1 cm now and the hernia repair is intact      He has a 3 cm epidermoid cyst in his presternal area. It is not infected but it bothers him a lot and he wants it excised. Return to schedule him for excision of the 3 cm anterior chest wall mass under monitored sedation a day surgery in the near future He recently had cardiac clearance don't think we need to repeat that I discussed indications, details, techniques, and risk of the surgery with him in detail. He is aware of the risk of bleeding, infection, difficulty closure of the wound, nerve damage with chronic pain, and other unforeseen problems. He understands these issues well. All his questions were answered. He agrees with this plan.       Comorbidities include cholecystectomy by me. Right inguinal hernia repair by Dr. Sheilah Hogan many years ago. Abdominal aortic aneurysm repair with good recovery. Coronary angioplasty 1989. Myocardial infarction. Hypertension.   Medication History  Timoptic (0.5% Solution, Ophthalmic) Active. Colace (100MG  Capsule, Oral) Active. Trusopt (2% Solution, Ophthalmic) Active. Melatonin (1MG  Tablet,  Oral) Active. Loratadine (10MG  Tablet, Oral) Active. Aspirin (81MG  Tablet, Oral) Active. Atenolol (50MG  Tablet, Oral) Active. Claritin (10MG  Tablet, Oral) Active. Gemfibrozil (600MG  Tablet, Oral) Active. Meclizine HCl (25MG  Tablet, Oral) Active. Multi-Vitamin (Oral) Active. Omeprazole (40MG  Capsule DR, Oral) Active. Simvastatin (10MG  Tablet, Oral) Active. Medications Reconciled  Vitals  Weight: 151.13 lb Height: 66in Body Surface Area: 1.78 m Body Mass Index: 24.39 kg/m  Temp.: 97.67F  Pulse: 91 (Regular)  P.OX: 97% (Room air) BP: 138/86 (Sitting, Left Arm, Standard)       Physical Exam  General Mental Status-Alert. General Appearance-Not in acute distress. Build & Nutrition-Well nourished. Posture-Normal posture. Gait-Normal. Note: Alert. Ambulating independently. Wife here with a walker. Mental status good. Oriented 4. Appropriate understanding of medical issues   Integumentary Note: In the presternal area midsternum there is a 3 cm chronic epidermoid cyst. A little bit mobile. Relatively flat. Not infected or tender. Clearly benign.   Head and Neck Head-normocephalic, atraumatic with no lesions or palpable masses. Trachea-midline. Thyroid Gland Characteristics - normal size and consistency and no palpable nodules.  Chest and Lung Exam Chest and lung exam reveals -on auscultation, normal breath sounds, no adventitious sounds and normal vocal resonance.  Cardiovascular Cardiovascular examination reveals -normal heart sounds, regular rate and rhythm with no murmurs and femoral artery auscultation bilaterally reveals normal pulses, no bruits, no thrills.  Abdomen Inspection Inspection of the abdomen reveals - No Hernias. Palpation/Percussion Palpation and Percussion of the abdomen reveal - Soft, Non Tender, No Rigidity (guarding), No hepatosplenomegaly and No Palpable  abdominal masses.  Male Genitourinary Note: Left  groin incision well healed. 8 mm seroma medially. Mobile. Nontender. Hernia repair intact. No scrotal mass.   Neurologic Neurologic evaluation reveals -alert and oriented x 3 with no impairment of recent or remote memory, normal attention span and ability to concentrate, normal sensation and normal coordination.  Musculoskeletal Normal Exam - Bilateral-Upper Extremity Strength Normal and Lower Extremity Strength Normal.    Assessment & Plan  EPIDERMOID CYST OF SKIN OF CHEST (L72.0)  You have a large, 3 cm chronic epidermoid cyst of the anterior chest wall anterior to your sternum We have discussed this on more than one occasion you stated that you would like to have this excised and that is reasonable you will be scheduled for excision of 3 cm epidermoid cyst anterior chest wall under monitored sedation at [facility] day [facility] center in the future at your convenience We have discussed the indications, techniques, and risks of this surgery in detail with you and your wife  In terms of your left inguinal hernia, it has healed well. The hernia repair is intact. The seroma is very small now, approximate 1 cm diameter. Nothing further needs to be done.  HISTORY OF RIGHT INGUINAL HERNIA REPAIR (Z98.890) LEFT INGUINAL HERNIA (K40.90) CORONARY ARTERY DISEASE, OCCLUSIVE (I25.10) HISTORY OF MYOCARDIAL INFARCTION (I25.2)    Christopher Hogan M. Christopher Hogan, M.D., [facility] General and Minimally invasive Surgery Breast and Colorectal Surgery Office:   [phone] Pager:   [phone]

## 2018-09-29 ENCOUNTER — Encounter (HOSPITAL_BASED_OUTPATIENT_CLINIC_OR_DEPARTMENT_OTHER): Payer: Self-pay | Admitting: Certified Registered"

## 2018-09-29 ENCOUNTER — Ambulatory Visit (HOSPITAL_BASED_OUTPATIENT_CLINIC_OR_DEPARTMENT_OTHER): Payer: Medicare Other | Admitting: Anesthesiology

## 2018-09-29 ENCOUNTER — Other Ambulatory Visit: Payer: Self-pay

## 2018-09-29 ENCOUNTER — Encounter (HOSPITAL_BASED_OUTPATIENT_CLINIC_OR_DEPARTMENT_OTHER)
Admission: RE | Disposition: A | Discharge status: Home / Self Care | Payer: Self-pay | Source: Ambulatory Visit | Attending: General Surgery

## 2018-09-29 ENCOUNTER — Ambulatory Visit (HOSPITAL_BASED_OUTPATIENT_CLINIC_OR_DEPARTMENT_OTHER)
Admission: RE | Admit: 2018-09-29 | Discharge: 2018-09-29 | Disposition: A | Discharge status: Home / Self Care | Payer: Medicare Other | Source: Ambulatory Visit | Attending: General Surgery | Admitting: General Surgery

## 2018-09-29 DIAGNOSIS — Z87891 Personal history of nicotine dependence: Secondary | ICD-10-CM | POA: Diagnosis not present

## 2018-09-29 DIAGNOSIS — I252 Old myocardial infarction: Secondary | ICD-10-CM | POA: Insufficient documentation

## 2018-09-29 DIAGNOSIS — Z955 Presence of coronary angioplasty implant and graft: Secondary | ICD-10-CM | POA: Insufficient documentation

## 2018-09-29 DIAGNOSIS — I2510 Atherosclerotic heart disease of native coronary artery without angina pectoris: Secondary | ICD-10-CM | POA: Diagnosis not present

## 2018-09-29 DIAGNOSIS — R222 Localized swelling, mass and lump, trunk: Secondary | ICD-10-CM | POA: Diagnosis present

## 2018-09-29 DIAGNOSIS — Z79899 Other long term (current) drug therapy: Secondary | ICD-10-CM | POA: Diagnosis not present

## 2018-09-29 DIAGNOSIS — E785 Hyperlipidemia, unspecified: Secondary | ICD-10-CM | POA: Diagnosis not present

## 2018-09-29 DIAGNOSIS — I110 Hypertensive heart disease with heart failure: Secondary | ICD-10-CM | POA: Diagnosis not present

## 2018-09-29 DIAGNOSIS — I5022 Chronic systolic (congestive) heart failure: Secondary | ICD-10-CM | POA: Diagnosis not present

## 2018-09-29 DIAGNOSIS — L720 Epidermal cyst: Secondary | ICD-10-CM | POA: Diagnosis not present

## 2018-09-29 DIAGNOSIS — Z7982 Long term (current) use of aspirin: Secondary | ICD-10-CM | POA: Insufficient documentation

## 2018-09-29 DIAGNOSIS — K219 Gastro-esophageal reflux disease without esophagitis: Secondary | ICD-10-CM | POA: Insufficient documentation

## 2018-09-29 HISTORY — PX: MASS EXCISION: SHX2000

## 2018-09-29 HISTORY — DX: Epidermal cyst: L72.0

## 2018-09-29 SURGERY — EXCISION MASS
Anesthesia: Monitor Anesthesia Care | Site: Chest

## 2018-09-29 MED ORDER — CEFAZOLIN SODIUM-DEXTROSE 2-4 GM/100ML-% IV SOLN
2.0000 g | INTRAVENOUS | Status: AC
  Administered 2018-09-29: 2 g via INTRAVENOUS

## 2018-09-29 MED ORDER — CHLORHEXIDINE GLUCONATE CLOTH 2 % EX PADS: 6.0000 | MEDICATED_PAD | Freq: Once | CUTANEOUS | Status: DC

## 2018-09-29 MED ORDER — GABAPENTIN 300 MG PO CAPS
ORAL_CAPSULE | ORAL | Status: AC
  Filled 2018-09-29: qty 1

## 2018-09-29 MED ORDER — SODIUM BICARBONATE 4 % IV SOLN
INTRAVENOUS | Status: AC
  Filled 2018-09-29: qty 5

## 2018-09-29 MED ORDER — GABAPENTIN 300 MG PO CAPS
300.0000 mg | ORAL_CAPSULE | ORAL | Status: AC
  Administered 2018-09-29: 300 mg via ORAL

## 2018-09-29 MED ORDER — FENTANYL CITRATE (PF) 100 MCG/2ML IJ SOLN
INTRAMUSCULAR | Status: AC
  Filled 2018-09-29: qty 2

## 2018-09-29 MED ORDER — LIDOCAINE-EPINEPHRINE (PF) 1 %-1:200000 IJ SOLN
INTRAMUSCULAR | Status: DC | PRN
  Administered 2018-09-29: 12 mL

## 2018-09-29 MED ORDER — MIDAZOLAM HCL 2 MG/2ML IJ SOLN: 1.0000 mg | INTRAMUSCULAR | Status: DC | PRN

## 2018-09-29 MED ORDER — PROPOFOL 500 MG/50ML IV EMUL
INTRAVENOUS | Status: DC | PRN
  Administered 2018-09-29: 25 ug/kg/min via INTRAVENOUS

## 2018-09-29 MED ORDER — CEFAZOLIN SODIUM-DEXTROSE 2-4 GM/100ML-% IV SOLN
INTRAVENOUS | Status: AC
  Filled 2018-09-29: qty 100

## 2018-09-29 MED ORDER — ONDANSETRON HCL 4 MG/2ML IJ SOLN: 4.0000 mg | Freq: Once | INTRAMUSCULAR | Status: DC | PRN

## 2018-09-29 MED ORDER — SCOPOLAMINE 1 MG/3DAYS TD PT72: 1.0000 | MEDICATED_PATCH | Freq: Once | TRANSDERMAL | Status: DC | PRN

## 2018-09-29 MED ORDER — ACETAMINOPHEN 500 MG PO TABS
1000.0000 mg | ORAL_TABLET | ORAL | Status: AC
  Administered 2018-09-29: 1000 mg via ORAL

## 2018-09-29 MED ORDER — FENTANYL CITRATE (PF) 100 MCG/2ML IJ SOLN: 25.0000 ug | INTRAMUSCULAR | Status: DC | PRN

## 2018-09-29 MED ORDER — ONDANSETRON HCL 4 MG/2ML IJ SOLN
INTRAMUSCULAR | Status: DC | PRN
  Administered 2018-09-29: 4 mg via INTRAVENOUS

## 2018-09-29 MED ORDER — FENTANYL CITRATE (PF) 100 MCG/2ML IJ SOLN
50.0000 ug | INTRAMUSCULAR | Status: DC | PRN
  Administered 2018-09-29: 25 ug via INTRAVENOUS

## 2018-09-29 MED ORDER — LACTATED RINGERS IV SOLN
INTRAVENOUS | Status: DC
  Administered 2018-09-29: 10:00:00 via INTRAVENOUS

## 2018-09-29 MED ORDER — SODIUM BICARBONATE 4 % IV SOLN
INTRAVENOUS | Status: DC | PRN
  Administered 2018-09-29: 1 mL via INTRAVENOUS

## 2018-09-29 MED ORDER — ACETAMINOPHEN 500 MG PO TABS
ORAL_TABLET | ORAL | Status: AC
  Filled 2018-09-29: qty 2

## 2018-09-29 MED ORDER — LIDOCAINE-EPINEPHRINE (PF) 1 %-1:200000 IJ SOLN
INTRAMUSCULAR | Status: AC
  Filled 2018-09-29: qty 30

## 2018-09-29 SURGICAL SUPPLY — 60 items
ADH SKN CLS APL DERMABOND .7 (GAUZE/BANDAGES/DRESSINGS) ×1
APL SKNCLS STERI-STRIP NONHPOA (GAUZE/BANDAGES/DRESSINGS)
BANDAGE ACE 6X5 VEL STRL LF (GAUZE/BANDAGES/DRESSINGS) IMPLANT
BENZOIN TINCTURE PRP APPL 2/3 (GAUZE/BANDAGES/DRESSINGS) IMPLANT
BLADE HEX COATED 2.75 (ELECTRODE) ×3 IMPLANT
BLADE SURG 15 STRL LF DISP TIS (BLADE) ×1 IMPLANT
BLADE SURG 15 STRL SS (BLADE) ×3
CANISTER SUCT 1200ML W/VALVE (MISCELLANEOUS) IMPLANT
CHLORAPREP W/TINT 26ML (MISCELLANEOUS) ×3 IMPLANT
CLOSURE WOUND 1/2 X4 (GAUZE/BANDAGES/DRESSINGS)
COVER BACK TABLE 60X90IN (DRAPES) ×3 IMPLANT
COVER MAYO STAND STRL (DRAPES) ×3 IMPLANT
COVER WAND RF STERILE (DRAPES) IMPLANT
DECANTER SPIKE VIAL GLASS SM (MISCELLANEOUS) IMPLANT
DERMABOND ADVANCED (GAUZE/BANDAGES/DRESSINGS) ×2
DERMABOND ADVANCED .7 DNX12 (GAUZE/BANDAGES/DRESSINGS) ×1 IMPLANT
DRAPE LAPAROTOMY 100X72 PEDS (DRAPES) ×3 IMPLANT
DRAPE LAPAROTOMY TRNSV 102X78 (DRAPE) IMPLANT
DRAPE UTILITY XL STRL (DRAPES) ×3 IMPLANT
ELECT REM PT RETURN 9FT ADLT (ELECTROSURGICAL) ×3
ELECTRODE REM PT RTRN 9FT ADLT (ELECTROSURGICAL) ×1 IMPLANT
GAUZE 4X4 16PLY RFD (DISPOSABLE) IMPLANT
GAUZE SPONGE 4X4 12PLY STRL LF (GAUZE/BANDAGES/DRESSINGS) IMPLANT
GLOVE BIO SURGEON STRL SZ 6.5 (GLOVE) ×4 IMPLANT
GLOVE BIO SURGEONS STRL SZ 6.5 (GLOVE) ×2
GLOVE BIOGEL PI IND STRL 6.5 (GLOVE) ×2 IMPLANT
GLOVE BIOGEL PI INDICATOR 6.5 (GLOVE) ×4
GLOVE EUDERMIC 7 POWDERFREE (GLOVE) ×3 IMPLANT
GOWN STRL REUS W/ TWL LRG LVL3 (GOWN DISPOSABLE) ×2 IMPLANT
GOWN STRL REUS W/ TWL XL LVL3 (GOWN DISPOSABLE) ×1 IMPLANT
GOWN STRL REUS W/TWL LRG LVL3 (GOWN DISPOSABLE) ×6
GOWN STRL REUS W/TWL XL LVL3 (GOWN DISPOSABLE) ×3
NEEDLE HYPO 22GX1.5 SAFETY (NEEDLE) IMPLANT
NEEDLE HYPO 25X1 1.5 SAFETY (NEEDLE) ×3 IMPLANT
NS IRRIG 1000ML POUR BTL (IV SOLUTION) ×3 IMPLANT
PACK BASIN DAY SURGERY FS (CUSTOM PROCEDURE TRAY) ×3 IMPLANT
PENCIL BUTTON HOLSTER BLD 10FT (ELECTRODE) ×3 IMPLANT
SHEET MEDIUM DRAPE 40X70 STRL (DRAPES) IMPLANT
SLEEVE SCD COMPRESS KNEE MED (MISCELLANEOUS) IMPLANT
SPONGE LAP 4X18 RFD (DISPOSABLE) ×3 IMPLANT
STAPLER VISISTAT 35W (STAPLE) IMPLANT
STRIP CLOSURE SKIN 1/2X4 (GAUZE/BANDAGES/DRESSINGS) IMPLANT
SUT ETHILON 4 0 PS 2 18 (SUTURE) IMPLANT
SUT MNCRL AB 4-0 PS2 18 (SUTURE) ×3 IMPLANT
SUT SILK 2 0 SH (SUTURE) ×3 IMPLANT
SUT VIC AB 2-0 SH 27 (SUTURE)
SUT VIC AB 2-0 SH 27XBRD (SUTURE) IMPLANT
SUT VIC AB 3-0 FS2 27 (SUTURE) IMPLANT
SUT VIC AB 4-0 P-3 18XBRD (SUTURE) IMPLANT
SUT VIC AB 4-0 P3 18 (SUTURE)
SUT VICRYL 3-0 CR8 SH (SUTURE) ×3 IMPLANT
SUT VICRYL 4-0 PS2 18IN ABS (SUTURE) IMPLANT
SYR 10ML LL (SYRINGE) ×3 IMPLANT
SYR BULB 3OZ (MISCELLANEOUS) IMPLANT
TAPE HYPAFIX 4 X10 (GAUZE/BANDAGES/DRESSINGS) IMPLANT
TOWEL GREEN STERILE FF (TOWEL DISPOSABLE) ×3 IMPLANT
TOWEL OR NON WOVEN STRL DISP B (DISPOSABLE) IMPLANT
TUBE CONNECTING 20'X1/4 (TUBING) ×1
TUBE CONNECTING 20X1/4 (TUBING) ×2 IMPLANT
YANKAUER SUCT BULB TIP NO VENT (SUCTIONS) ×3 IMPLANT

## 2018-09-29 NOTE — Discharge Instructions (Signed)
Keep the wound clean and dry. There are no visible stitches. The wound is painted with a clear hard plastic superglue which will wear off in about 3 weeks  He may begin taking a sponge bath or shower starting tomorrow Do not take any tub baths yet  Take Tylenol 650 mg, alternating with ibuprofen 400 mg every 4 hours for the next 24 hours He should not need any narcotic pain medicine  Ice pack for 10 minutes at a time, often known for 24 hours while awake  Be careful when walking.  Avoid falls.  Pay attention.  Post Anesthesia Home Care Instructions  Activity: Get plenty of rest for the remainder of the day. A responsible individual must stay with you for 24 hours following the procedure.  For the next 24 hours, DO NOT: -Drive a car -Paediatric nurse -Drink alcoholic beverages -Take any medication unless instructed by your physician -Make any legal decisions or sign important papers.  Meals: Start with liquid foods such as gelatin or soup. Progress to regular foods as tolerated. Avoid greasy, spicy, heavy foods. If nausea and/or vomiting occur, drink only clear liquids until the nausea and/or vomiting subsides. Call your physician if vomiting continues.  Special Instructions/Symptoms: Your throat may feel dry or sore from the anesthesia or the breathing tube placed in your throat during surgery. If this causes discomfort, gargle with warm salt water. The discomfort should disappear within 24 hours.  If you had a scopolamine patch placed behind your ear for the management of post- operative nausea and/or vomiting:  1. The medication in the patch is effective for 72 hours, after which it should be removed.  Wrap patch in a tissue and discard in the trash. Wash hands thoroughly with soap and water. 2. You may remove the patch earlier than 72 hours if you experience unpleasant side effects which may include dry mouth, dizziness or visual disturbances. 3. Avoid touching the patch. Wash  your hands with soap and water after contact with the patch.

## 2018-09-29 NOTE — Anesthesia Postprocedure Evaluation (Signed)
Anesthesia Post Note  Patient: Christopher Hogan  Procedure(s) Performed: EXCISION OF 3 CM MASS ANTERIOR CHEST WALL (N/A Chest)     Patient location during evaluation: PACU Anesthesia Type: MAC Level of consciousness: awake and alert and oriented Pain management: pain level controlled Vital Signs Assessment: post-procedure vital signs reviewed and stable Respiratory status: spontaneous breathing, nonlabored ventilation and respiratory function stable Cardiovascular status: stable and blood pressure returned to baseline Postop Assessment: no apparent nausea or vomiting Anesthetic complications: no    Last Vitals:  Vitals:   09/29/18 1030 09/29/18 1045  BP: (!) 157/70 (!) 147/71  Pulse: (!) 58 (!) 57  Resp: 15 (!) 9  Temp:    SpO2: 100% 100%    Last Pain:  Vitals:   09/29/18 1023  TempSrc:   PainSc: 0-No pain                 Kristia Jupiter A.

## 2018-09-29 NOTE — Op Note (Signed)
Patient Name:           Christopher Hogan   Date of Surgery:        09/29/2018  Pre op Diagnosis:      3 3 cm diameter presternal chest wall mass  Post op Diagnosis:    Same, suspect epidermoid cyst  Procedure:                 Excision 3 cm presternal chest wall mass with layered closure  Surgeon:                     Edsel Petrin. Dalbert Batman, M.D., FACS  Assistant:                      Carlena Hurl, [place]  Operative Indications:    This is a very pleasant 84 year old man who is brought to the operating room to excise the large epidermoid cyst from his presternal area.n. [name] is his PCP. Dr. Ottie Glazier is his cardiologist.       On June 30, 2018 he underwent open repair of a very large left inguinal hernia with mesh. He recovered uneventfully.      He has a 3 cm epidermoid cyst in his presternal area. It is not infected but it bothers him a lot and he wants it excised. He recently had cardiac clearance don't think we need to repeat that  He agrees with this plan.        Operative Findings:       The incision was 5.5 cm vertically by 2.5 cm transversely.  I excise this large chronic epidermoid cyst without rupturing the cyst.  There is no evidence of active infection or purulence.  I closed the incision in 3 layers.  Procedure in Detail:          The patient was brought to the operating room placed supine on the operating table.  He was monitored and sedated by the anesthesia department.  Intravenous antibiotics were given.  Anterior chest wall was prepped and draped in a sterile fashion.  Surgical timeout was performed.    A sagittally oriented elliptical incision was made after planning the incision.  Dissection was carried down through the dermis, subcutaneous tissue, and closely around the cyst all the way down to the pre-sternal fascia.  This was removed and sent to the pathology lab.  Hemostasis was excellent.  The wound was irrigated.  We undermined the subcuttaneous tissue  bit.   Deep subcutaneous tissue was closed with interrupted 3-0 Vicryl.  Superficial subcutaneous tissue closed with interrupted 3-0 Vicryl.  Skin closed with subcuticular 4-0 Monocryl and Dermabond.  Patient tolerated procedure well was taken to PACU in stable condition.  EBL 5 cc.  Counts correct.  Complications none.     Edsel Petrin. Dalbert Batman, M.D., FACS General and Minimally Invasive Surgery Breast and Colorectal Surgery  09/29/2018 10:22 AM

## 2018-09-29 NOTE — Interval H&P Note (Signed)
History and Physical Interval Note:  09/29/2018 9:16 AM  Christopher Hogan  has presented today for surgery, with the diagnosis of Epidermoid cyst  The various methods of treatment have been discussed with the patient and family. After consideration of risks, benefits and other options for treatment, the patient has consented to  Procedure(s): EXCISION OF 3 CM MASS ANTERIOR CHEST WALL (N/A) as a surgical intervention .  The patient's history has been reviewed, patient examined, no change in status, stable for surgery.  I have reviewed the patient's chart and labs.  Questions were answered to the patient's satisfaction.     [name]

## 2018-09-29 NOTE — Transfer of Care (Signed)
Immediate Anesthesia Transfer of Care Note  Patient: Christopher Hogan  Procedure(s) Performed: EXCISION OF 3 CM MASS ANTERIOR CHEST WALL (N/A Chest)  Patient Location: PACU  Anesthesia Type:MAC  Level of Consciousness: awake, alert , oriented and patient cooperative  Airway & Oxygen Therapy: Patient Spontanous Breathing and Patient connected to face mask oxygen  Post-op Assessment: Report given to RN and Post -op Vital signs reviewed and stable  Post vital signs: Reviewed and stable  Last Vitals:  Vitals Value Taken Time  BP    Temp    Pulse 60 09/29/2018 10:23 AM  Resp    SpO2 100 % 09/29/2018 10:23 AM  Vitals shown include unvalidated device data.  Last Pain:  Vitals:   09/29/18 0927  TempSrc: Oral  PainSc: 0-No pain         Complications: No apparent anesthesia complications

## 2018-09-29 NOTE — Anesthesia Preprocedure Evaluation (Addendum)
Anesthesia Evaluation  Patient identified by MRN, date of birth, ID band Patient awake    Reviewed: Allergy & Precautions, NPO status , Patient's Chart, lab work & pertinent test results  Airway Mallampati: II  TM Distance: >3 FB Neck ROM: Full    Dental  (+) Missing,    Pulmonary pneumonia, resolved, former smoker,    Pulmonary exam normal breath sounds clear to auscultation       Cardiovascular hypertension, Pt. on medications and Pt. on home beta blockers + CAD, + Past MI, + Cardiac Stents and +CHF  Normal cardiovascular exam Rhythm:Regular Rate:Normal  LVEF 30-44% Echo 2017 MI 1989 Inferior wall- PCI with stent AAA repair 2003   Neuro/Psych Glaucoma Wet macular degeneration OS negative neurological ROS  negative psych ROS   GI/Hepatic Neg liver ROS, GERD  Medicated and Controlled,  Endo/Other  negative endocrine ROSHyperlipidemia  Renal/GU negative Renal ROS   ED    Musculoskeletal negative musculoskeletal ROS (+) Arthritis , Osteoarthritis,  3cm mass anterior chest wall   Abdominal   Peds  Hematology negative hematology ROS (+) HLD   Anesthesia Other Findings   Reproductive/Obstetrics                            Anesthesia Physical  Anesthesia Plan  ASA: III  Anesthesia Plan: MAC   Post-op Pain Management: GA combined w/ Regional for post-op pain   Induction: Intravenous  PONV Risk Score and Plan: 1 and Ondansetron, Dexamethasone and Treatment may vary due to age or medical condition  Airway Management Planned: Natural Airway, Simple Face Mask and Nasal Cannula  Additional Equipment:   Intra-op Plan:   Post-operative Plan:   Informed Consent: I have reviewed the patients History and Physical, chart, labs and discussed the procedure including the risks, benefits and alternatives for the proposed anesthesia with the patient or authorized representative who has indicated  his/her understanding and acceptance.   Dental advisory given  Plan Discussed with: CRNA and Surgeon  Anesthesia Plan Comments:         Anesthesia Quick Evaluation

## 2018-09-30 ENCOUNTER — Encounter (HOSPITAL_BASED_OUTPATIENT_CLINIC_OR_DEPARTMENT_OTHER): Payer: Self-pay | Admitting: General Surgery

## 2018-10-01 NOTE — Progress Notes (Signed)
Inform patient of Pathology report,. Chest wall mass was a benign cyst. Let me know you reached him  HMI

## 2018-10-02 ENCOUNTER — Encounter (INDEPENDENT_AMBULATORY_CARE_PROVIDER_SITE_OTHER): Payer: Medicare Other | Admitting: Ophthalmology

## 2018-10-02 DIAGNOSIS — H35033 Hypertensive retinopathy, bilateral: Secondary | ICD-10-CM

## 2018-10-02 DIAGNOSIS — I10 Essential (primary) hypertension: Secondary | ICD-10-CM

## 2018-10-02 DIAGNOSIS — H353231 Exudative age-related macular degeneration, bilateral, with active choroidal neovascularization: Secondary | ICD-10-CM | POA: Diagnosis not present

## 2018-10-02 DIAGNOSIS — H43813 Vitreous degeneration, bilateral: Secondary | ICD-10-CM | POA: Diagnosis not present

## 2018-10-07 ENCOUNTER — Ambulatory Visit (INDEPENDENT_AMBULATORY_CARE_PROVIDER_SITE_OTHER): Payer: Medicare Other | Admitting: Cardiology

## 2018-10-07 ENCOUNTER — Encounter: Payer: Self-pay | Admitting: Cardiology

## 2018-10-07 VITALS — BP 142/62 | HR 60 | Ht 68.0 in | Wt 146.0 lb

## 2018-10-07 DIAGNOSIS — E782 Mixed hyperlipidemia: Secondary | ICD-10-CM | POA: Diagnosis not present

## 2018-10-07 DIAGNOSIS — I255 Ischemic cardiomyopathy: Secondary | ICD-10-CM

## 2018-10-07 DIAGNOSIS — I2510 Atherosclerotic heart disease of native coronary artery without angina pectoris: Secondary | ICD-10-CM | POA: Diagnosis not present

## 2018-10-07 DIAGNOSIS — I10 Essential (primary) hypertension: Secondary | ICD-10-CM

## 2018-10-07 NOTE — Patient Instructions (Signed)
Medication Instructions:   Your physician recommends that you continue on your current medications as directed. Please refer to the Current Medication list given to you today.  If you need a refill on your cardiac medications before your next appointment, please call your pharmacy.       Follow-Up: At [facility], you and your health needs are our priority.  As part of our continuing mission to provide you with exceptional heart care, we have created designated Provider Care Teams.  These Care Teams include your primary Cardiologist (physician) and Advanced Practice Providers (APPs -  Physician Assistants and Nurse Practitioners) who all work together to provide you with the care you need, when you need it. You will need a follow up appointment in 6 months.  Please call our office 2 months in advance to schedule this appointment.  You may see Katarina Nelson, MD or one of the following Advanced Practice Providers on your designated Care Team:   Brittainy Simmons, PA-C Dayna Dunn, PA-C . Michele Lenze, PA-C     

## 2018-10-07 NOTE — Progress Notes (Signed)
10/07/2018 Christopher Hogan   1925/10/06  [id number]  Primary Physician Colon Branch, MD Primary Cardiologist: Dr Meda Coffee  HPI:  Pleasant,  89-y/o male with a history of remote inferior wall MI treated with PTCA. His last Myoview was 2017 and showed inferior scar without ischemia. He has LVD with an EF of 33-48%. In addition he has treated HTN, HLD, and is s/p AAA repair in 2003. He has done well from a cardiac standpoint. He is in the office today accompanied by his wife for pre op clearance prior to Lt inguinal hernia repair.   The pt had CAP in July of this year and took him a while to recover.  He then underwent abdominal hernia repair in August.   Today he states that he has been doing well, he denies any chest pain or shortness of breath, he continues to live in his own condo and takes care of most of the house chores with his wife, he does not use cane or walker, he has not had recent falls other than a fall when he was dehydrated and fatigue while having pneumonia earlier this summer.  He is compliant with his meds and has no side effects.  Current Outpatient Medications  Medication Sig Dispense Refill  . amitriptyline (ELAVIL) 10 MG tablet Take 1 tablet (10 mg total) by mouth at bedtime as needed for sleep. 30 tablet 3  . amLODipine (NORVASC) 2.5 MG tablet Take 1 tablet (2.5 mg total) by mouth daily. 90 tablet 3  . aspirin 81 MG chewable tablet Chew 81 mg by mouth daily.     Marland Kitchen atenolol (TENORMIN) 50 MG tablet TAKE ONE TABLET BY MOUTH EVERY DAY 90 tablet 3  . azelastine (ASTELIN) 0.1 % nasal spray Place 2 sprays into both nostrils 2 (two) times daily. Use in each nostril as directed 30 mL 3  . Dorzolamide HCl-Timolol Mal PF 22.3-6.8 MG/ML SOLN Apply 1 drop to eye 2 (two) times daily. bith eyes    . gemfibrozil (LOPID) 600 MG tablet TAKE ONE TABLET BY MOUTH EVERY DAY 90 tablet 3  . loratadine (CLARITIN) 10 MG tablet Take 10 mg by mouth daily.    . meclizine (ANTIVERT) 25 MG tablet  Take 1 tablet (25 mg total) by mouth 2 (two) times daily as needed for dizziness. 60 tablet 1  . Multiple Vitamin (MULTIVITAMIN) capsule Take 1 capsule by mouth daily.      . Multiple Vitamins-Minerals (ICAPS AREDS FORMULA PO) Take 1 capsule by mouth daily.    Marland Kitchen omeprazole (PRILOSEC) 20 MG capsule Take 20 mg by mouth daily.      . simvastatin (ZOCOR) 10 MG tablet Take 1 tablet (10 mg total) by mouth at bedtime. 90 tablet 3  . traMADol (ULTRAM) 50 MG tablet Take 1 tablet (50 mg total) by mouth every 6 (six) hours as needed. 10 tablet 0   No current facility-administered medications for this visit.     Allergies  Allergen Reactions  . Ace Inhibitors Cough  . Aspirin Nausea Only and Other (See Comments)    Full strength only  . Spironolactone Rash    Past Medical History:  Diagnosis Date  . AAA (abdominal aortic aneurysm) ([place])    repaired 02/2002, renal ultrasound, Sept 2010: normal abdominal aorta, normal renal arteries   . Age-related macular degeneration, wet, left eye ([place])   . Allergic rhinitis   . Arthritis   . At risk for pulmonary fibrosis 06/25/2018  . CAD (coronary artery  disease)    stable stress test 7/11  . CHF (congestive heart failure) ([place])   . Chronic systolic CHF (congestive heart failure) ([place]) 12/17/2013  . Coronary atherosclerosis 06/09/2007   H/O inferior wall MI- PTCA- 1989 Last stress test 2/17- inferior scar, no ischemia    . DEGENERATIVE JOINT DISEASE 03/17/2010   Qualifier: Diagnosis of  By: Dawson Bills    . Dyslipidemia 05/06/2007   LDL 59 Jan 2019     . Eczema 07/09/2013  . ED (erectile dysfunction)   . Epidermoid cyst of skin of chest 09/29/2018  . ERECTILE DYSFUNCTION 12/09/2007   Qualifier: Diagnosis of  By: Larose Kells MD, [place] hypertension 04/30/2007   Qualifier: Diagnosis of  By: Larose Kells MD, [place] GERD 07/21/2008   Qualifier: Diagnosis of  By: Larose Kells MD, [place] GERD (gastroesophageal reflux disease)   . Glaucoma and macular  degeneration 12/31/2011  . Hyperlipidemia   . Hypertension   . Inguinal hernia 12/31/2011  . Insomnia 02/22/2018  . Ischemic cardiomyopathy 06/23/2018   Last EF 30-44% by Myoview Feb 2017  . Left inguinal hernia 06/30/2018  . Myocardial infarction ([place]) 1989   interior wall infarction  . PCP notes >>>>>>>>>>>>>>>>>>>> 07/17/2016  . Pneumonia 2019  . Pre-operative cardiovascular examination 12/31/2011  . Rhinitis 04/20/2008   Qualifier: Diagnosis of  By: Larose Kells MD, [place] Vertigo 06/20/2015    Social History   Socioeconomic History  . Marital status: Married    Spouse name: Not on file  . Number of children: 3  . Years of education: Not on file  . Highest education level: Not on file  Occupational History  . Occupation: retired     Fish farm manager: RETIRED  Social Needs  . Financial resource strain: Not on file  . Food insecurity:    Worry: Not on file    Inability: Not on file  . Transportation needs:    Medical: Not on file    Non-medical: Not on file  Tobacco Use  . Smoking status: Former Research scientist (life sciences)  . Smokeless tobacco: Never Used  . Tobacco comment: quit in 1953  Substance and Sexual Activity  . Alcohol use: No  . Drug use: No  . Sexual activity: Not Currently  Lifestyle  . Physical activity:    Days per week: Not on file    Minutes per session: Not on file  . Stress: Not on file  Relationships  . Social connections:    Talks on phone: Not on file    Gets together: Not on file    Attends religious service: Not on file    Active member of club or organization: Not on file    Attends meetings of clubs or organizations: Not on file    Relationship status: Not on file  . Intimate partner violence:    Fear of current or ex partner: Not on file    Emotionally abused: Not on file    Physically abused: Not on file    Forced sexual activity: Not on file  Other Topics Concern  . Not on file  Social History Narrative   WWII veteran   Independent on his ADL     Lives w/ wife       Family History  Problem Relation Age of Onset  . Lung cancer Father   . CAD Other        mother?  . Kidney Stones Son   .  Prostate cancer Neg Hx   . Diabetes Neg Hx   . Colon cancer Neg Hx      Review of Systems: General: negative for chills, fever, night sweats or weight changes.  Cardiovascular: negative for chest pain, dyspnea on exertion, edema, orthopnea, palpitations, paroxysmal nocturnal dyspnea or shortness of breath Dermatological: negative for rash Respiratory: negative for cough or wheezing Urologic: negative for hematuria Abdominal: negative for nausea, vomiting, diarrhea, bright red blood per rectum, melena, or hematemesis Neurologic: negative for visual changes, syncope, or dizziness All other systems reviewed and are otherwise negative except as noted above.  Blood pressure (!) 142/62, pulse 60, height 5\' 8"  (1.727 m), weight 146 lb (66.2 kg), SpO2 99 %.  General appearance: alert, cooperative and no distress Neck: no carotid bruit and no JVD Lungs: velcro crackles at bases (scarring on prior CXR) Heart: regular rate and rhythm Abdomen: soft, non-tender; bowel sounds normal; no masses,  no organomegaly and midline surgical scar Extremities: no edema Pulses: 2+ and symmetric Skin: cool, pale, dry Neurologic: Grossly normal  EKG not obtained today.   ASSESSMENT AND PLAN:   1.  CAD -stable asymptomatic, continue low-dose aspirin, simvastatin and fenofibrate, he has no memory impairment, he is tolerating it well.  2.  Hypertension -well-controlled  3.  Hyperlipidemia -as above.  Follow-up in 6 months.  Ena Dawley, MD 10/07/2018, 10:30 AM

## 2018-10-27 ENCOUNTER — Ambulatory Visit: Payer: Medicare Other | Admitting: Internal Medicine

## 2018-10-28 ENCOUNTER — Ambulatory Visit (INDEPENDENT_AMBULATORY_CARE_PROVIDER_SITE_OTHER): Payer: Medicare Other | Admitting: Internal Medicine

## 2018-10-28 ENCOUNTER — Encounter: Payer: Self-pay | Admitting: Internal Medicine

## 2018-10-28 VITALS — BP 126/68 | HR 61 | Temp 97.8°F | Resp 16 | Ht 68.0 in | Wt 151.1 lb

## 2018-10-28 DIAGNOSIS — M5416 Radiculopathy, lumbar region: Secondary | ICD-10-CM

## 2018-10-28 DIAGNOSIS — Z23 Encounter for immunization: Secondary | ICD-10-CM

## 2018-10-28 DIAGNOSIS — I10 Essential (primary) hypertension: Secondary | ICD-10-CM | POA: Diagnosis not present

## 2018-10-28 DIAGNOSIS — I255 Ischemic cardiomyopathy: Secondary | ICD-10-CM

## 2018-10-28 DIAGNOSIS — E785 Hyperlipidemia, unspecified: Secondary | ICD-10-CM

## 2018-10-28 MED ORDER — PREDNISONE 10 MG PO TABS: ORAL_TABLET | ORAL | 0 refills | Status: DC

## 2018-10-28 NOTE — Progress Notes (Signed)
Subjective:    Patient ID: Christopher Hogan, male    DOB: 01-Nov-1925, 84 y.o.   MRN: [id number]  DOS:  10/28/2018 Type of visit - description : f/u CAD: Note from cardiology reviewed. HTN: Good compliance with medication, due for labs. 6 weeks ago, he developed pain going from the left buttock down to the lateral aspect of his leg to the toe.  Since then, the pain is on and off, occasionally takes Aleve, reports on and off numbness on the left great toe as well.  Review of Systems Denies fever chills No rash at the lower extremities No fall or injury. No chest pain no difficulty breathing No cough.  Past Medical History:  Diagnosis Date  . AAA (abdominal aortic aneurysm) ([place])    repaired 02/2002, renal ultrasound, Sept 2010: normal abdominal aorta, normal renal arteries   . Age-related macular degeneration, wet, left eye ([place])   . Allergic rhinitis   . Arthritis   . At risk for pulmonary fibrosis 06/25/2018  . CAD (coronary artery disease)    stable stress test 7/11  . CHF (congestive heart failure) ([place])   . Chronic systolic CHF (congestive heart failure) ([place]) 12/17/2013  . Coronary atherosclerosis 06/09/2007   H/O inferior wall MI- PTCA- 1989 Last stress test 2/17- inferior scar, no ischemia    . DEGENERATIVE JOINT DISEASE 03/17/2010   Qualifier: Diagnosis of  By: Dawson Bills    . Dyslipidemia 05/06/2007   LDL 59 Jan 2019     . Eczema 07/09/2013  . ED (erectile dysfunction)   . Epidermoid cyst of skin of chest 09/29/2018  . ERECTILE DYSFUNCTION 12/09/2007   Qualifier: Diagnosis of  By: Larose Kells MD, [place] hypertension 04/30/2007   Qualifier: Diagnosis of  By: Larose Kells MD, [place] GERD 07/21/2008   Qualifier: Diagnosis of  By: Larose Kells MD, [place] GERD (gastroesophageal reflux disease)   . Glaucoma and macular degeneration 12/31/2011  . Hyperlipidemia   . Hypertension   . Inguinal hernia 12/31/2011  . Insomnia 02/22/2018  . Ischemic cardiomyopathy 06/23/2018   Last EF  30-44% by Myoview Feb 2017  . Left inguinal hernia 06/30/2018  . Myocardial infarction ([place]) 1989   interior wall infarction  . PCP notes >>>>>>>>>>>>>>>>>>>> 07/17/2016  . Pneumonia 2019  . Pre-operative cardiovascular examination 12/31/2011  . Rhinitis 04/20/2008   Qualifier: Diagnosis of  By: Larose Kells MD, [place] Vertigo 06/20/2015    Past Surgical History:  Procedure Laterality Date  . AAA repair  2003  . CARDIAC CATHETERIZATION    . CHOLECYSTECTOMY    . INGUINAL HERNIA REPAIR Right 1985  . INGUINAL HERNIA REPAIR Left 06/30/2018   open; w/mesh  . INGUINAL HERNIA REPAIR Left 06/30/2018   Procedure: OPEN LEFT INGUINAL HERNIA REPAIR;  Surgeon: Fanny Skates, MD;  Location: [facility];  Service: General;  Laterality: Left;  . INSERTION OF MESH Left 06/30/2018   Procedure: INSERTION OF MESH;  Surgeon: Fanny Skates, MD;  Location: [facility];  Service: General;  Laterality: Left;  Mar[place] Kitchen MASS EXCISION N/A 09/29/2018   Procedure: EXCISION OF 3 CM MASS ANTERIOR CHEST WALL;  Surgeon: Fanny Skates, MD;  Location: [facility];  Service: General;  Laterality: N/A;  . PTCA  1989, 1992    Social History   Socioeconomic History  . Marital status: Married    Spouse name: Not on file  . Number of children: 3  .  Years of education: Not on file  . Highest education level: Not on file  Occupational History  . Occupation: retired     Fish farm manager: RETIRED  Social Needs  . Financial resource strain: Not on file  . Food insecurity:    Worry: Not on file    Inability: Not on file  . Transportation needs:    Medical: Not on file    Non-medical: Not on file  Tobacco Use  . Smoking status: Former Research scientist (life sciences)  . Smokeless tobacco: Never Used  . Tobacco comment: quit in 1953  Substance and Sexual Activity  . Alcohol use: No  . Drug use: No  . Sexual activity: Not Currently  Lifestyle  . Physical activity:    Days per week: Not on file    Minutes per session: Not on file  . Stress: Not on file    Relationships  . Social connections:    Talks on phone: Not on file    Gets together: Not on file    Attends religious service: Not on file    Active member of club or organization: Not on file    Attends meetings of clubs or organizations: Not on file    Relationship status: Not on file  . Intimate partner violence:    Fear of current or ex partner: Not on file    Emotionally abused: Not on file    Physically abused: Not on file    Forced sexual activity: Not on file  Other Topics Concern  . Not on file  Social History Narrative   WWII veteran   Independent on his ADL     Lives w/ wife      Allergies as of 10/28/2018      Reactions   Ace Inhibitors Cough   Aspirin Nausea Only, Other (See Comments)   Full strength only   Spironolactone Rash      Medication List        Accurate as of 10/28/18 11:59 PM. Always use your most recent med list.          amitriptyline 10 MG tablet Commonly known as:  ELAVIL Take 1 tablet (10 mg total) by mouth at bedtime as needed for sleep.   amLODipine 2.5 MG tablet Commonly known as:  NORVASC Take 1 tablet (2.5 mg total) by mouth daily.   aspirin 81 MG chewable tablet Chew 81 mg by mouth daily.   atenolol 50 MG tablet Commonly known as:  TENORMIN Take 1 tablet (50 mg total) by mouth daily.   azelastine 0.1 % nasal spray Commonly known as:  ASTELIN Place 2 sprays into both nostrils 2 (two) times daily. Use in each nostril as directed   CLARITIN 10 MG tablet Generic drug:  loratadine Take 10 mg by mouth daily as needed for allergies.   docusate sodium 100 MG capsule Commonly known as:  COLACE Take 100 mg by mouth daily as needed for mild constipation.   dorzolamidel-timolol 22.3-6.8 MG/ML Soln ophthalmic solution Commonly known as:  COSOPT Place 1 drop into both eyes 2 (two) times daily.   fenofibrate 48 MG tablet Commonly known as:  TRICOR Take 48 mg by mouth daily.   ICAPS AREDS FORMULA PO Take 1 capsule by  mouth daily.   meclizine 25 MG tablet Commonly known as:  ANTIVERT Take 1 tablet (25 mg total) by mouth 2 (two) times daily as needed for dizziness.   MELATONIN PO Take 1 tablet by mouth at bedtime as needed (sleep).  multivitamin capsule Take 1 capsule by mouth daily.   omeprazole 20 MG capsule Commonly known as:  PRILOSEC Take 20 mg by mouth daily.   predniSONE 10 MG tablet Commonly known as:  DELTASONE 4 tablets x 2 days, 3 tabs x 2 days, 2 tabs x 2 days, 1 tab x 2 days   senna 8.6 MG Tabs tablet Commonly known as:  SENOKOT Take 1 tablet (8.6 mg total) by mouth 2 (two) times daily.   simvastatin 10 MG tablet Commonly known as:  ZOCOR Take 1 tablet (10 mg total) by mouth at bedtime.           Objective:   Physical Exam BP 126/68 (BP Location: Left Arm, Patient Position: Sitting, Cuff Size: Small)   Pulse 61   Temp 97.8 F (36.6 C) (Oral)   Resp 16   Ht 5\' 8"  (1.727 m)   Wt 151 lb 2 oz (68.5 kg)   SpO2 98%   BMI 22.98 kg/m  General:   Well developed, NAD, BMI noted. HEENT:  Normocephalic . Face symmetric, atraumatic Lungs:  CTA B, today I did not appreciate the Velcro crackles as I did few months ago. Normal respiratory effort, no intercostal retractions, no accessory muscle use. Heart: RRR,  no murmur.  No pretibial edema bilaterally  Skin: Not pale. Not jaundice MSK:  No TTP at the lower back. Hip rotation normal bilaterally. Neurologic:  alert & oriented X3.  Speech normal, gait appropriate for age and unassisted Motor and DTRs symmetric Psych--  Cognition and judgment appear intact.  Cooperative with normal attention span and concentration.  Behavior appropriate. No anxious or depressed appearing.      Assessment & Plan:   Assessment HTN Hyperlipidemia GERD Insomnia-- elavil prn All rhinitis CV: --CAD, MI 1989, stress test (-) 2011  --CHF --lexiscan no ischemia  08-2016 --AAA, s/p repair 2003, [place] 2010 ok Opht: glaucoma, macular  degeneration  [facility]   PLAN: HTN: Seems controlled on amlodipine, Tenormin.  Check a BMP High cholesterol: On simvastatin, check FLP Radiculopathy: See above, symptoms C/W radiculopathy, symptoms are on and off, today exam is symmetric.  No history of injury fever or chills. We will treat conservatively with prednisone.  Tylenol is okay.  Minimize use of Aleve with GI precautions.  Call if not better.  Patient and wife verbalized understanding. CAD: Saw Dr. Meda Coffee last month, felt to be stable. S/p inguinal hernia repair and cyst removal: Doing well   Preventive care: Flu shot today RTC 4 to 5 months routine checkup

## 2018-10-28 NOTE — Patient Instructions (Addendum)
Please schedule Medicare Wellness with Glenard Haring.   GO TO THE LAB : Get the blood work     GO TO THE FRONT DESK Schedule your next appointment for a checkup in 4 to 5 months  For pain: Take prednisone for few days as prescribed Tylenol  500 mg OTC 2 tabs a day every 8 hours as needed for pain Okay to take Advil 2-3 times a week, always with food. Call if not gradually better

## 2018-10-28 NOTE — Progress Notes (Signed)
Pre visit review using our clinic review tool, if applicable. No additional management support is needed unless otherwise documented below in the visit note. 

## 2018-10-29 LAB — LIPID PANEL
Cholesterol: 115 mg/dL (ref 0–200)
HDL: 35.6 mg/dL — ABNORMAL LOW (ref >39.00)
LDL Cholesterol: 45 mg/dL (ref 0–99)
NonHDL: 79.68
Total CHOL/HDL Ratio: 3
Triglycerides: 172 mg/dL — ABNORMAL HIGH (ref 0.0–149.0)
VLDL: 34.4 mg/dL (ref 0.0–40.0)

## 2018-10-29 LAB — BASIC METABOLIC PANEL
BUN: 14 mg/dL (ref 6–23)
CO2: 29 mEq/L (ref 19–32)
Calcium: 8.5 mg/dL (ref 8.4–10.5)
Chloride: 104 mEq/L (ref 96–112)
Creatinine, Ser: 0.96 mg/dL (ref 0.40–1.50)
GFR: 78.02 mL/min (ref >60.00)
Glucose, Bld: 98 mg/dL (ref 70–99)
Potassium: 4.8 mEq/L (ref 3.5–5.1)
Sodium: 138 mEq/L (ref 135–145)

## 2018-10-29 NOTE — Assessment & Plan Note (Signed)
HTN: Seems controlled on amlodipine, Tenormin.  Check a BMP High cholesterol: On simvastatin, check FLP Radiculopathy: See above, symptoms C/W radiculopathy, symptoms are on and off, today exam is symmetric.  No history of injury fever or chills. We will treat conservatively with prednisone.  Tylenol is okay.  Minimize use of Aleve with GI precautions.  Call if not better.  Patient and wife verbalized understanding. CAD: Saw Dr. Meda Coffee last month, felt to be stable. S/p inguinal hernia repair and cyst removal: Doing well   Preventive care: Flu shot today RTC 4

## 2018-10-30 ENCOUNTER — Encounter (INDEPENDENT_AMBULATORY_CARE_PROVIDER_SITE_OTHER): Payer: Medicare Other | Admitting: Ophthalmology

## 2018-10-30 DIAGNOSIS — H43813 Vitreous degeneration, bilateral: Secondary | ICD-10-CM | POA: Diagnosis not present

## 2018-10-30 DIAGNOSIS — H353231 Exudative age-related macular degeneration, bilateral, with active choroidal neovascularization: Secondary | ICD-10-CM | POA: Diagnosis not present

## 2018-10-30 DIAGNOSIS — H35033 Hypertensive retinopathy, bilateral: Secondary | ICD-10-CM

## 2018-10-30 DIAGNOSIS — I10 Essential (primary) hypertension: Secondary | ICD-10-CM | POA: Diagnosis not present

## 2018-11-27 ENCOUNTER — Encounter (INDEPENDENT_AMBULATORY_CARE_PROVIDER_SITE_OTHER): Payer: Medicare Other | Admitting: Ophthalmology

## 2018-11-27 DIAGNOSIS — H35033 Hypertensive retinopathy, bilateral: Secondary | ICD-10-CM | POA: Diagnosis not present

## 2018-11-27 DIAGNOSIS — I10 Essential (primary) hypertension: Secondary | ICD-10-CM

## 2018-11-27 DIAGNOSIS — H353231 Exudative age-related macular degeneration, bilateral, with active choroidal neovascularization: Secondary | ICD-10-CM | POA: Diagnosis not present

## 2018-11-27 DIAGNOSIS — H4423 Degenerative myopia, bilateral: Secondary | ICD-10-CM | POA: Diagnosis not present

## 2018-11-27 DIAGNOSIS — H43813 Vitreous degeneration, bilateral: Secondary | ICD-10-CM

## 2019-01-01 ENCOUNTER — Encounter (INDEPENDENT_AMBULATORY_CARE_PROVIDER_SITE_OTHER): Payer: Medicare Other | Admitting: Ophthalmology

## 2019-01-01 DIAGNOSIS — H353231 Exudative age-related macular degeneration, bilateral, with active choroidal neovascularization: Secondary | ICD-10-CM | POA: Diagnosis not present

## 2019-01-01 DIAGNOSIS — H43813 Vitreous degeneration, bilateral: Secondary | ICD-10-CM

## 2019-01-01 IMAGING — CR DG ABDOMEN ACUTE W/ 1V CHEST
3 series · 3 of 3 positions shown · non-contrast
Comparison: None.

CLINICAL DATA: Inguinal hernia.

EXAM:
DG ABDOMEN ACUTE W/ 1V CHEST

[w chest pa]
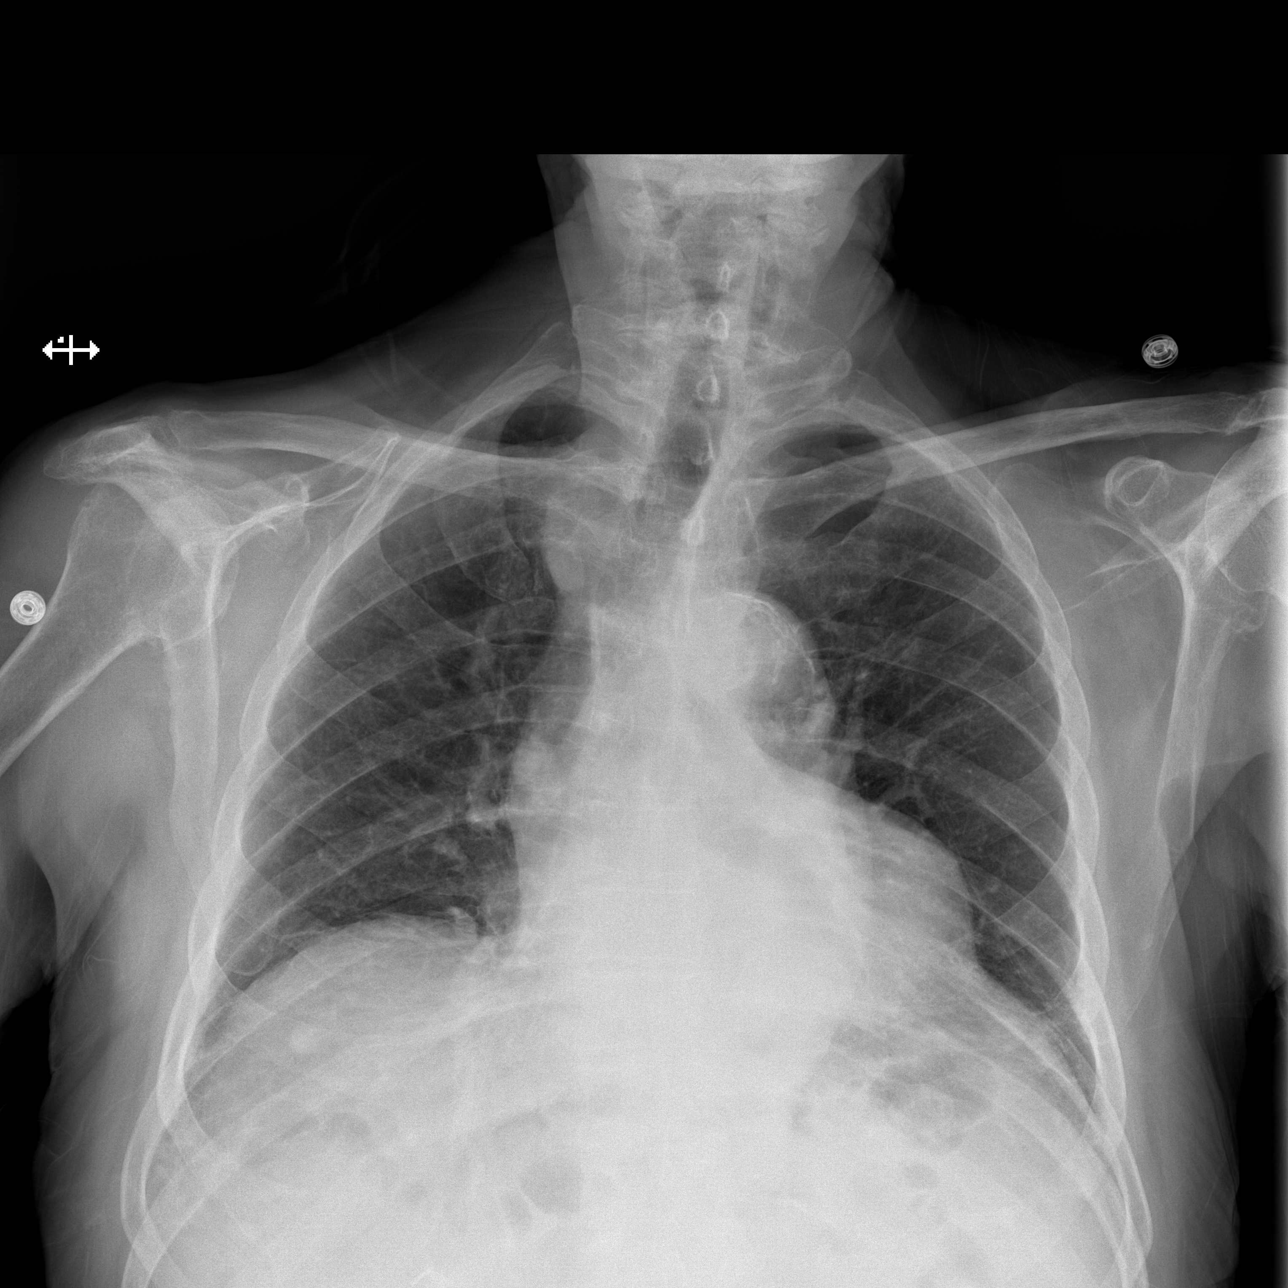

[w abdomen upright]
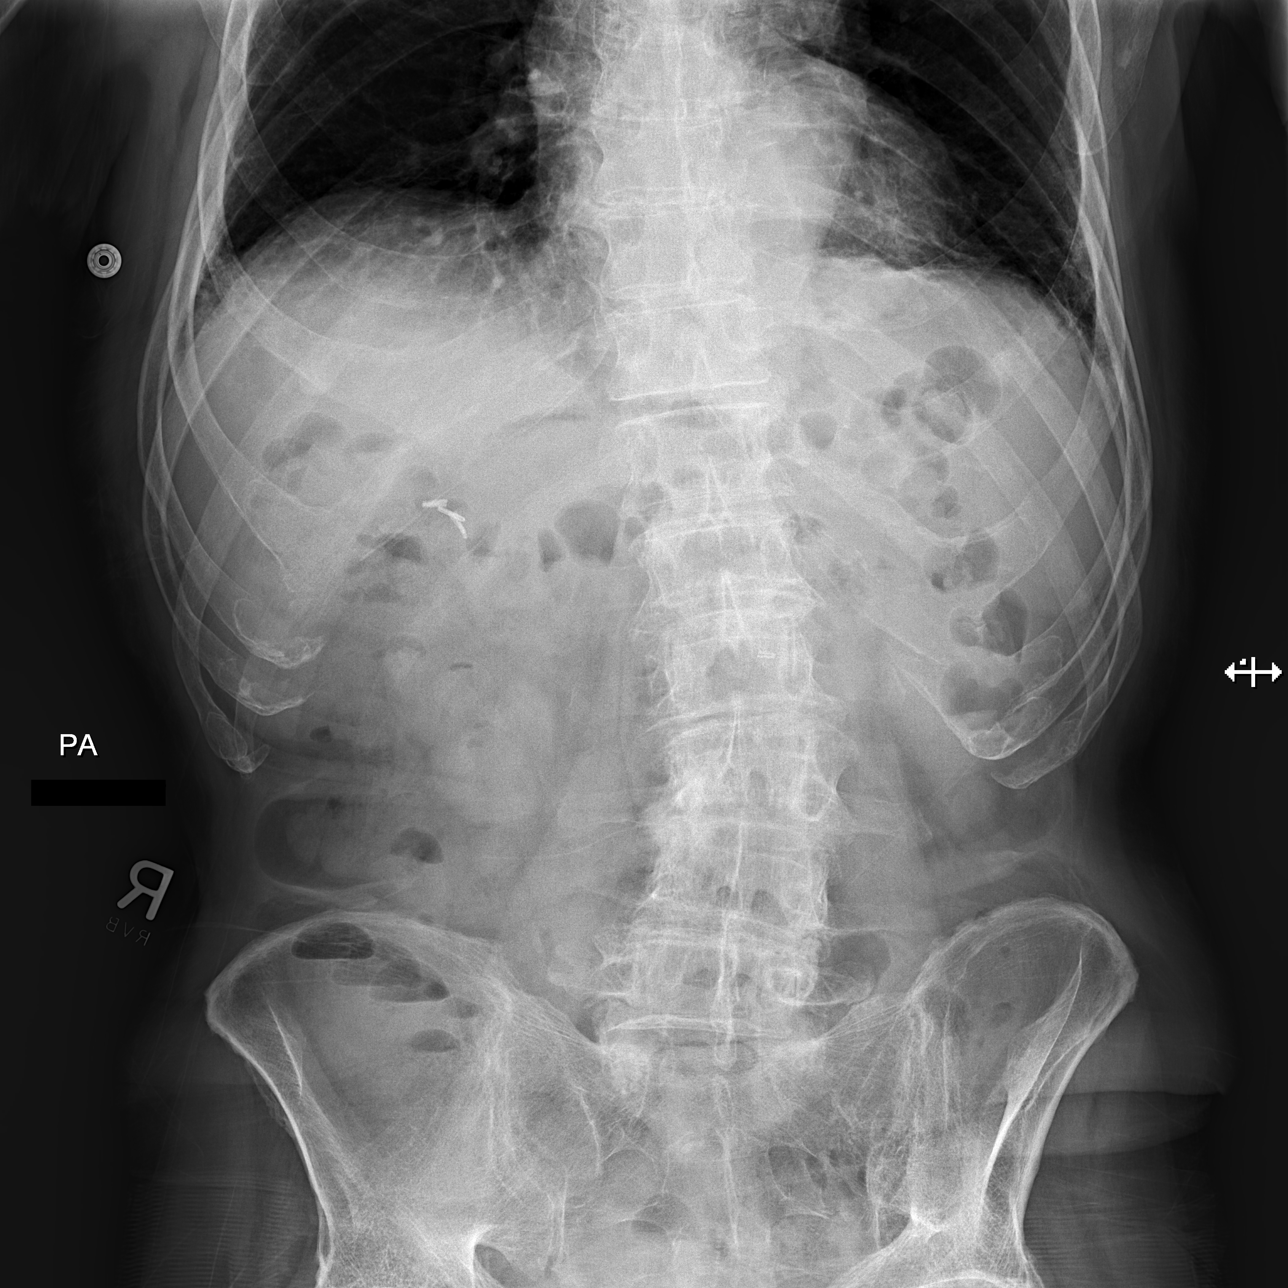

[t abdomen supine]
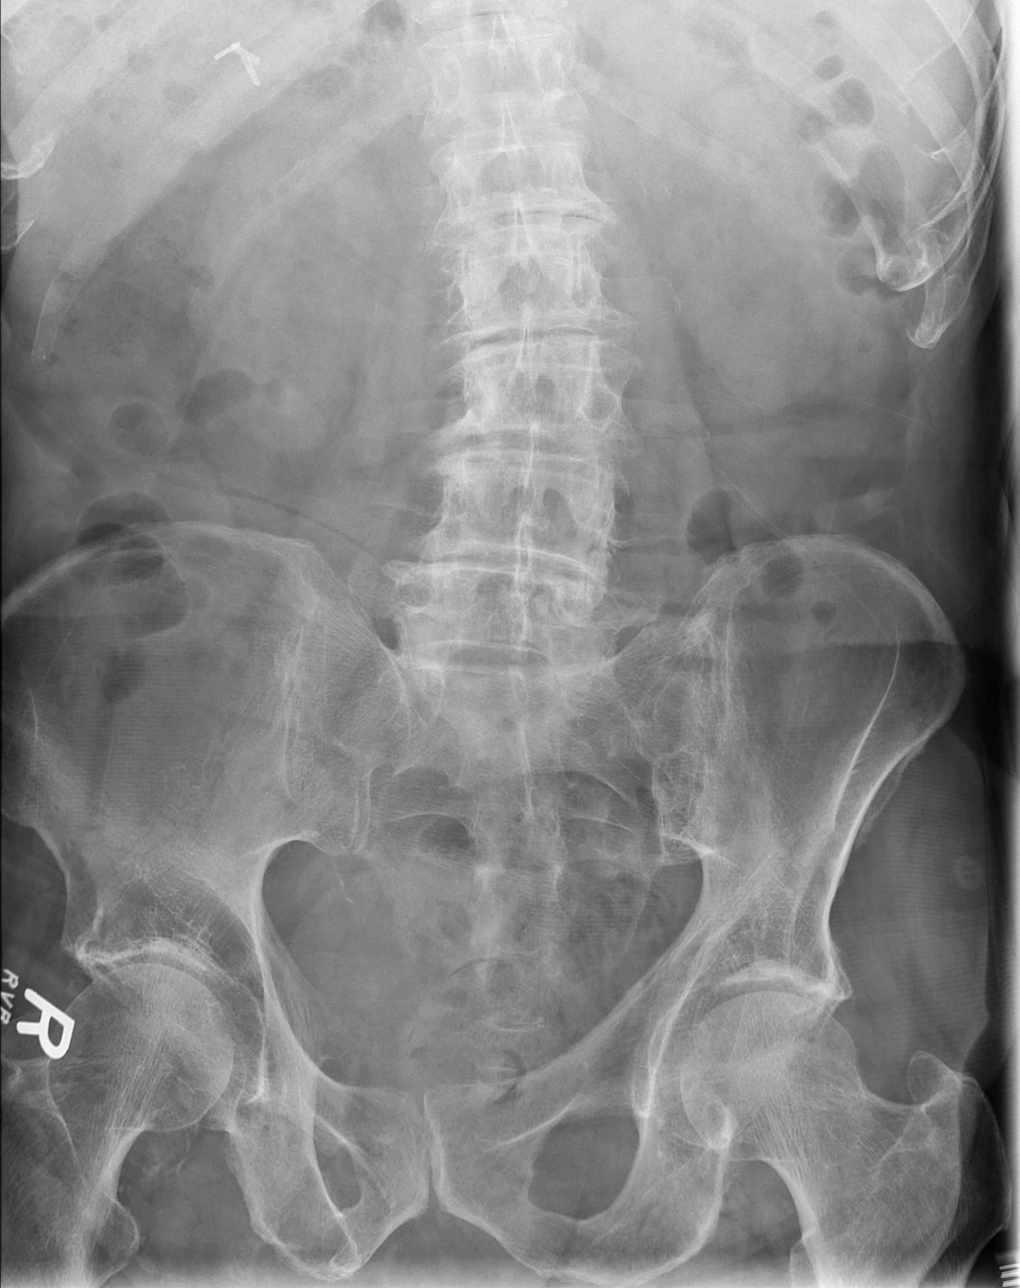

[3 of 3 positions shown; findings below may reference images not displayed]

FINDINGS: There is no evidence of dilated bowel loops or free intraperitoneal
air. No radiopaque calculi or other significant radiographic
abnormality is seen. Status post cholecystectomy. Heart size and
mediastinal contours are within normal limits. Both lungs are clear.
IMPRESSION: No evidence of bowel obstruction or ileus. No acute cardiopulmonary
disease.

## 2019-01-30 ENCOUNTER — Other Ambulatory Visit: Payer: Self-pay

## 2019-01-30 ENCOUNTER — Encounter (INDEPENDENT_AMBULATORY_CARE_PROVIDER_SITE_OTHER): Payer: Medicare Other | Admitting: Ophthalmology

## 2019-01-30 DIAGNOSIS — H353231 Exudative age-related macular degeneration, bilateral, with active choroidal neovascularization: Secondary | ICD-10-CM | POA: Diagnosis not present

## 2019-01-30 DIAGNOSIS — H43813 Vitreous degeneration, bilateral: Secondary | ICD-10-CM

## 2019-01-30 DIAGNOSIS — H35033 Hypertensive retinopathy, bilateral: Secondary | ICD-10-CM | POA: Diagnosis not present

## 2019-01-30 DIAGNOSIS — I10 Essential (primary) hypertension: Secondary | ICD-10-CM

## 2019-03-02 ENCOUNTER — Ambulatory Visit: Payer: Medicare Other | Admitting: Internal Medicine

## 2019-03-03 ENCOUNTER — Ambulatory Visit (INDEPENDENT_AMBULATORY_CARE_PROVIDER_SITE_OTHER): Payer: Medicare Other | Admitting: Internal Medicine

## 2019-03-03 ENCOUNTER — Other Ambulatory Visit: Payer: Self-pay

## 2019-03-03 DIAGNOSIS — E785 Hyperlipidemia, unspecified: Secondary | ICD-10-CM | POA: Diagnosis not present

## 2019-03-03 DIAGNOSIS — R399 Unspecified symptoms and signs involving the genitourinary system: Secondary | ICD-10-CM | POA: Diagnosis not present

## 2019-03-03 DIAGNOSIS — J310 Chronic rhinitis: Secondary | ICD-10-CM | POA: Diagnosis not present

## 2019-03-03 DIAGNOSIS — I10 Essential (primary) hypertension: Secondary | ICD-10-CM

## 2019-03-03 MED ORDER — TAMSULOSIN HCL 0.4 MG PO CAPS: 0.4000 mg | ORAL_CAPSULE | Freq: Every day | ORAL | 0 refills | Status: DC

## 2019-03-03 NOTE — Progress Notes (Signed)
Subjective:    Patient ID: Christopher Hogan, male    DOB: August 01, 1925, 85 y.o.   MRN: [id number]  DOS:  03/03/2019 Attempts were made to do these a video visit but we were unsuccessful due to patient's technical difficulties. Virtual Visit via Telephone Note  I connected with@ on 03/03/19 at  2:20 PM EDT by telephone and verified that I am speaking with the correct person using two identifiers.  THIS ENCOUNTER IS A VIRTUAL VISIT DUE TO COVID-19 - PATIENT WAS NOT SEEN IN THE OFFICE. PATIENT HAS CONSENTED TO VIRTUAL VISIT / TELEMEDICINE VISIT   Location of patient: home  Location of provider: office  I discussed the limitations, risks, security and privacy concerns of performing an evaluation and management service by telephone and the availability of in person appointments. I also discussed with the patient that there may be a patient responsible charge related to this service. The patient expressed understanding and agreed to proceed.   History of Present Illness: Routine office visit Since the last office visit he is doing well, took prednisone for radiculopathy and that quickly resolved. Coronavirus: Following all the recommendations from the CDC HTN: Good compliance with medications, normal ambulatory BPs We reviewed together his medications and recent labs  Review of Systems  His wife checks his temperature regularly: No fever or chills Denies chest pain, difficulty breathing or edema. Still has persistent nasal congestion and occasional cough, at baseline. For the last couple of months has noted some nocturia, approximately 2-3 times at night. No dysuria, gross hematuria or difficulty urinating.  Past Medical History:  Diagnosis Date  . AAA (abdominal aortic aneurysm) ([place])    repaired 02/2002, renal ultrasound, Sept 2010: normal abdominal aorta, normal renal arteries   . Age-related macular degeneration, wet, left eye ([place])   . Allergic rhinitis   . Arthritis   . At risk  for pulmonary fibrosis 06/25/2018  . CAD (coronary artery disease)    stable stress test 7/11  . CHF (congestive heart failure) ([place])   . Chronic systolic CHF (congestive heart failure) ([place]) 12/17/2013  . Coronary atherosclerosis 06/09/2007   H/O inferior wall MI- PTCA- 1989 Last stress test 2/17- inferior scar, no ischemia    . DEGENERATIVE JOINT DISEASE 03/17/2010   Qualifier: Diagnosis of  By: Dawson Bills    . Dyslipidemia 05/06/2007   LDL 59 Jan 2019     . Eczema 07/09/2013  . ED (erectile dysfunction)   . Epidermoid cyst of skin of chest 09/29/2018  . ERECTILE DYSFUNCTION 12/09/2007   Qualifier: Diagnosis of  By: Larose Kells MD, [place] hypertension 04/30/2007   Qualifier: Diagnosis of  By: Larose Kells MD, [place] GERD 07/21/2008   Qualifier: Diagnosis of  By: Larose Kells MD, [place] GERD (gastroesophageal reflux disease)   . Glaucoma and macular degeneration 12/31/2011  . Hyperlipidemia   . Hypertension   . Inguinal hernia 12/31/2011  . Insomnia 02/22/2018  . Ischemic cardiomyopathy 06/23/2018   Last EF 30-44% by Myoview Feb 2017  . Left inguinal hernia 06/30/2018  . Myocardial infarction ([place]) 1989   interior wall infarction  . PCP notes >>>>>>>>>>>>>>>>>>>> 07/17/2016  . Pneumonia 2019  . Pre-operative cardiovascular examination 12/31/2011  . Rhinitis 04/20/2008   Qualifier: Diagnosis of  By: Larose Kells MD, [place] Vertigo 06/20/2015    Past Surgical History:  Procedure Laterality Date  . AAA repair  2003  . CARDIAC CATHETERIZATION    .  CHOLECYSTECTOMY    . INGUINAL HERNIA REPAIR Right 1985  . INGUINAL HERNIA REPAIR Left 06/30/2018   open; w/mesh  . INGUINAL HERNIA REPAIR Left 06/30/2018   Procedure: OPEN LEFT INGUINAL HERNIA REPAIR;  Surgeon: Fanny Skates, MD;  Location: [facility];  Service: General;  Laterality: Left;  . INSERTION OF MESH Left 06/30/2018   Procedure: INSERTION OF MESH;  Surgeon: Fanny Skates, MD;  Location: [facility];  Service: General;  Laterality: Left;  Marland Kitchen MASS  EXCISION N/A 09/29/2018   Procedure: EXCISION OF 3 CM MASS ANTERIOR CHEST WALL;  Surgeon: Fanny Skates, MD;  Location: [facility];  Service: General;  Laterality: N/A;  . PTCA  1989, 1992    Social History   Socioeconomic History  . Marital status: Married    Spouse name: Not on file  . Number of children: 3  . Years of education: Not on file  . Highest education level: Not on file  Occupational History  . Occupation: retired     Fish farm manager: RETIRED  Social Needs  . Financial resource strain: Not on file  . Food insecurity:    Worry: Not on file    Inability: Not on file  . Transportation needs:    Medical: Not on file    Non-medical: Not on file  Tobacco Use  . Smoking status: Former Research scientist (life sciences)  . Smokeless tobacco: Never Used  . Tobacco comment: quit in 1953  Substance and Sexual Activity  . Alcohol use: No  . Drug use: No  . Sexual activity: Not Currently  Lifestyle  . Physical activity:    Days per week: Not on file    Minutes per session: Not on file  . Stress: Not on file  Relationships  . Social connections:    Talks on phone: Not on file    Gets together: Not on file    Attends religious service: Not on file    Active member of club or organization: Not on file    Attends meetings of clubs or organizations: Not on file    Relationship status: Not on file  . Intimate partner violence:    Fear of current or ex partner: Not on file    Emotionally abused: Not on file    Physically abused: Not on file    Forced sexual activity: Not on file  Other Topics Concern  . Not on file  Social History Narrative   WWII veteran   Independent on his ADL     Lives w/ wife      Allergies as of 03/03/2019      Reactions   Ace Inhibitors Cough   Aspirin Nausea Only, Other (See Comments)   Full strength only   Spironolactone Rash      Medication List       Accurate as of March 03, 2019  2:17 PM. Always use your most recent med list.         amitriptyline 10 MG tablet Commonly known as:  ELAVIL Take 1 tablet (10 mg total) by mouth at bedtime as needed for sleep.   amLODipine 2.5 MG tablet Commonly known as:  NORVASC Take 1 tablet (2.5 mg total) by mouth daily.   aspirin 81 MG chewable tablet Chew 81 mg by mouth daily.   atenolol 50 MG tablet Commonly known as:  TENORMIN Take 1 tablet (50 mg total) by mouth daily.   azelastine 0.1 % nasal spray Commonly known as:  ASTELIN Place 2 sprays into both  nostrils 2 (two) times daily. Use in each nostril as directed   Claritin 10 MG tablet Generic drug:  loratadine Take 10 mg by mouth daily as needed for allergies.   docusate sodium 100 MG capsule Commonly known as:  COLACE Take 100 mg by mouth daily as needed for mild constipation.   dorzolamidel-timolol 22.3-6.8 MG/ML Soln ophthalmic solution Commonly known as:  COSOPT Place 1 drop into both eyes 2 (two) times daily.   fenofibrate 48 MG tablet Commonly known as:  TRICOR Take 48 mg by mouth daily.   ICAPS AREDS FORMULA PO Take 1 capsule by mouth daily.   meclizine 25 MG tablet Commonly known as:  Antivert Take 1 tablet (25 mg total) by mouth 2 (two) times daily as needed for dizziness.   MELATONIN PO Take 1 tablet by mouth at bedtime as needed (sleep).   multivitamin capsule Take 1 capsule by mouth daily.   omeprazole 20 MG capsule Commonly known as:  PRILOSEC Take 20 mg by mouth daily.   predniSONE 10 MG tablet Commonly known as:  DELTASONE 4 tablets x 2 days, 3 tabs x 2 days, 2 tabs x 2 days, 1 tab x 2 days   senna 8.6 MG Tabs tablet Commonly known as:  SENOKOT Take 1 tablet (8.6 mg total) by mouth 2 (two) times daily.   simvastatin 10 MG tablet Commonly known as:  ZOCOR Take 1 tablet (10 mg total) by mouth at bedtime.           Objective:   Physical Exam There were no vitals taken for this visit. This is telephone virtual visit, he is alert and oriented x3 and in no apparent distress     Assessment      Assessment HTN Hyperlipidemia GERD Insomnia-- elavil prn All rhinitis CV: --CAD, MI 1989, stress test (-) 2011  --CHF --lexiscan no ischemia  08-2016 --AAA, s/p repair 2003, [place] 2010 ok Opht: glaucoma, macular degeneration  [facility]   PLAN: HTN: Continue amlodipine, Tenormin, last BMP satisfactory.  Ambulatory BP ~ 138/75 High cholesterol: Continue simvastatin, TriCor.  Last FLP satisfactory Radiculopathy: Resolved after po steroids.  Still takes Aleve once daily, recommend to minimize the use of NSAIDs. LUTS: New issue, nocturia for 2 months, under normal circumstances I would do a DRE, PSA, UA and urine culture but given the coronavirus situation is not advisable to bring the patient's to the office.  Will do a trial with Flomax, watch for side effects, call for a refill if needed. Allergic rhinitis: Still has some symptoms despite using nasal sprays regularly. Patient education, coronavirus: Encouraged to continue all the CDC recommendations for prevention. RTC 3 to 4 months, advised patient to call for an appointment.  I discussed the assessment and treatment plan with the patient. The patient was provided an opportunity to ask questions and all were answered. The patient agreed with the plan and demonstrated an understanding of the instructions.   The patient was advised to call back or seek an in-person evaluation if the symptoms worsen or if the condition fails to improve as anticipated.  I provided  20 minutes of non-face-to-face time during this encounter.  [name], MD

## 2019-03-04 NOTE — Assessment & Plan Note (Signed)
HTN: Continue amlodipine, Tenormin, last BMP satisfactory.  Ambulatory BP ~ 138/75 High cholesterol: Continue simvastatin, TriCor.  Last FLP satisfactory Radiculopathy: Resolved after po steroids.  Still takes Aleve once daily, recommend to minimize the use of NSAIDs. LUTS: New issue, nocturia for 2 months, under normal circumstances I would do a DRE, PSA, UA and urine culture but given the coronavirus situation is not advisable to bring the patient's to the office.  Will do a trial with Flomax, watch for side effects, call for a refill if needed. Allergic rhinitis: Still has some symptoms despite using nasal sprays regularly. Patient education, coronavirus: Encouraged to continue all the CDC recommendations for prevention. RTC 3 to 4 months, advised patient to call for an appointment.

## 2019-03-06 ENCOUNTER — Encounter (INDEPENDENT_AMBULATORY_CARE_PROVIDER_SITE_OTHER): Payer: Medicare Other | Admitting: Ophthalmology

## 2019-03-20 ENCOUNTER — Other Ambulatory Visit: Payer: Self-pay

## 2019-03-20 ENCOUNTER — Encounter (INDEPENDENT_AMBULATORY_CARE_PROVIDER_SITE_OTHER): Payer: Medicare Other | Admitting: Ophthalmology

## 2019-03-20 DIAGNOSIS — H35033 Hypertensive retinopathy, bilateral: Secondary | ICD-10-CM | POA: Diagnosis not present

## 2019-03-20 DIAGNOSIS — I10 Essential (primary) hypertension: Secondary | ICD-10-CM | POA: Diagnosis not present

## 2019-03-20 DIAGNOSIS — H43813 Vitreous degeneration, bilateral: Secondary | ICD-10-CM

## 2019-03-20 DIAGNOSIS — H353231 Exudative age-related macular degeneration, bilateral, with active choroidal neovascularization: Secondary | ICD-10-CM | POA: Diagnosis not present

## 2019-03-20 DEATH — deceased

## 2019-03-26 ENCOUNTER — Telehealth: Payer: Self-pay

## 2019-03-26 ENCOUNTER — Other Ambulatory Visit: Payer: Self-pay | Admitting: Internal Medicine

## 2019-03-26 NOTE — Telephone Encounter (Signed)
   TELEPHONE CALL NOTE  This patient has been deemed a candidate for follow-up tele-health visit to limit community exposure during the Covid-19 pandemic. I spoke with the patient via phone to discuss instructions.  A Virtual Office Visit appointment type has been scheduled for 5/11 @ 10:00 with Lyda Jester, PA with "VIDEO" or "TELEPHONE" in the appointment notes - patient prefers Telephone type.   Frederik Schmidt, RN 03/26/2019 3:52 PM  Patient has verbally consented to a Virtual Visit by Phone with Lyda Jester, PA on 5/11 @ 10:00.

## 2019-03-27 ENCOUNTER — Telehealth: Payer: Self-pay | Admitting: Internal Medicine

## 2019-03-27 NOTE — Telephone Encounter (Signed)
Spoke w/ Pt- aware of what Flomax is used for.

## 2019-03-27 NOTE — Telephone Encounter (Signed)
Copied from [place] [id number]. Topic: Quick Communication - See Telephone Encounter >> Mar 27, 2019  9:59 AM Vernona Rieger wrote: CRM for notification. See Telephone encounter for: 03/27/19.  Patient's wife called and said that the pharmacy contacted that he had a prescription ready. She said she asked what it was and they told her it was for his prostate. She said she was unaware of this and would like to speak with a nurse.

## 2019-03-30 ENCOUNTER — Encounter: Payer: Self-pay | Admitting: Cardiology

## 2019-03-30 ENCOUNTER — Telehealth (INDEPENDENT_AMBULATORY_CARE_PROVIDER_SITE_OTHER): Payer: Medicare Other | Admitting: Cardiology

## 2019-03-30 ENCOUNTER — Other Ambulatory Visit: Payer: Self-pay

## 2019-03-30 VITALS — BP 129/70 | HR 66 | Temp 97.5°F | Ht 68.0 in | Wt 153.0 lb

## 2019-03-30 DIAGNOSIS — I2510 Atherosclerotic heart disease of native coronary artery without angina pectoris: Secondary | ICD-10-CM | POA: Diagnosis not present

## 2019-03-30 DIAGNOSIS — I10 Essential (primary) hypertension: Secondary | ICD-10-CM

## 2019-03-30 NOTE — Progress Notes (Addendum)
Virtual Visit via Telephone Note   This visit type was conducted due to national recommendations for restrictions regarding the COVID-19 Pandemic (e.g. social distancing) in an effort to limit this patient's exposure and mitigate transmission in our community.  Due to his co-morbid illnesses, this patient is at least at moderate risk for complications without adequate follow up.  This format is felt to be most appropriate for this patient at this time.  The patient did not have access to video technology/had technical difficulties with video requiring transitioning to audio format only (telephone).  All issues noted in this document were discussed and addressed.  No physical exam could be performed with this format.  Please refer to the patient's chart for his  consent to telehealth for [facility].   Date:  03/30/2019   ID:  [name], DOB February 17, 1925, MRN [id number]  Patient Location: Home Provider Location: Home  PCP:  Colon Branch, MD  Cardiologist:  Ena Dawley, MD  Electrophysiologist:  None   Evaluation Performed:  Follow-Up Visit  Chief Complaint:  F/u for CAD  History of Present Illness:    Pleasant,  89-y/o male, followed by Dr. Meda Coffee, with a history of remote inferior wall MI treated with PTCA. His last Myoview was 2017 and showed inferior scar without ischemia. He has LVD with an EF of 33-48%. In addition he has treated HTN, HLD, and is s/p AAA repair in 2003. He was seen in 2019 for pre op clearance prior to Lt inguinal hernia repair. He was cleared to have surgery, which was done in August and he did well w/o any cardiac issues. Last seen by Dr. Meda Coffee 09/2018 and was doing well w/o symptoms or complaints. He was advised to f/u in 6 months.   Today, Pt reports that he has been doing well. Continues to live home w/ wife. Able to do ADLs w/o assistance. Pt reports that they have done well from a symptom standpoint since their last office visit. They deny chest pain  and dyspnea. No exertional symptoms w/ exercise or ADLs. Pt also denies orthopnea, PND, LEE, palpitations, dizziness, syncope/ near syncope and claudication.   They report full medication compliance. No intolerances or medication side effects. Pt also denies any hospitalizations,  ED/urgent care visits or surgeries since their last office visit.    BP is well controlled today.   The patient does not have symptoms concerning for COVID-19 infection (fever, chills, cough, or new shortness of breath). He has been social distancing.    Past Medical History:  Diagnosis Date  . AAA (abdominal aortic aneurysm) ([place])    repaired 02/2002, renal ultrasound, Sept 2010: normal abdominal aorta, normal renal arteries   . Age-related macular degeneration, wet, left eye ([place])   . Allergic rhinitis   . Arthritis   . At risk for pulmonary fibrosis 06/25/2018  . CAD (coronary artery disease)    stable stress test 7/11  . CHF (congestive heart failure) ([place])   . Chronic systolic CHF (congestive heart failure) ([place]) 12/17/2013  . Coronary atherosclerosis 06/09/2007   H/O inferior wall MI- PTCA- 1989 Last stress test 2/17- inferior scar, no ischemia    . DEGENERATIVE JOINT DISEASE 03/17/2010   Qualifier: Diagnosis of  By: Dawson Bills    . Dyslipidemia 05/06/2007   LDL 59 Jan 2019     . Eczema 07/09/2013  . ED (erectile dysfunction)   . Epidermoid cyst of skin of chest 09/29/2018  . ERECTILE DYSFUNCTION 12/09/2007  Qualifier: Diagnosis of  By: Larose Kells MD, [place] hypertension 04/30/2007   Qualifier: Diagnosis of  By: Larose Kells MD, [place] GERD 07/21/2008   Qualifier: Diagnosis of  By: Larose Kells MD, [place] GERD (gastroesophageal reflux disease)   . Glaucoma and macular degeneration 12/31/2011  . Hyperlipidemia   . Hypertension   . Inguinal hernia 12/31/2011  . Insomnia 02/22/2018  . Ischemic cardiomyopathy 06/23/2018   Last EF 30-44% by Myoview Feb 2017  . Left inguinal hernia 06/30/2018  . Myocardial  infarction ([place]) 1989   interior wall infarction  . PCP notes >>>>>>>>>>>>>>>>>>>> 07/17/2016  . Pneumonia 2019  . Pre-operative cardiovascular examination 12/31/2011  . Rhinitis 04/20/2008   Qualifier: Diagnosis of  By: Larose Kells MD, [place] Vertigo 06/20/2015   Past Surgical History:  Procedure Laterality Date  . AAA repair  2003  . CARDIAC CATHETERIZATION    . CHOLECYSTECTOMY    . INGUINAL HERNIA REPAIR Right 1985  . INGUINAL HERNIA REPAIR Left 06/30/2018   open; w/mesh  . INGUINAL HERNIA REPAIR Left 06/30/2018   Procedure: OPEN LEFT INGUINAL HERNIA REPAIR;  Surgeon: Fanny Skates, MD;  Location: [facility];  Service: General;  Laterality: Left;  . INSERTION OF MESH Left 06/30/2018   Procedure: INSERTION OF MESH;  Surgeon: Fanny Skates, MD;  Location: [place];  Service: General;  Laterality: Left;  Marland Kitchen MASS EXCISION N/A 09/29/2018   Procedure: EXCISION OF 3 CM MASS ANTERIOR CHEST WALL;  Surgeon: Fanny Skates, MD;  Location: [place];  Service: General;  Laterality: N/A;  . PTCA  1989, 1992     Current Meds  Medication Sig  . amitriptyline (ELAVIL) 10 MG tablet Take 1 tablet (10 mg total) by mouth at bedtime as needed for sleep.  Marland Kitchen amLODipine (NORVASC) 2.5 MG tablet Take 1 tablet (2.5 mg total) by mouth daily.  Marland Kitchen aspirin 81 MG chewable tablet Chew 81 mg by mouth daily.   Marland Kitchen atenolol (TENORMIN) 50 MG tablet Take 1 tablet (50 mg total) by mouth daily.  Marland Kitchen azelastine (ASTELIN) 0.1 % nasal spray Place 2 sprays into both nostrils 2 (two) times daily. Use in each nostril as directed  . dextromethorphan-guaiFENesin (MUCINEX DM) 30-600 MG 12hr tablet Take 1 tablet by mouth 2 (two) times daily.  Marland Kitchen docusate sodium (COLACE) 100 MG capsule Take 100 mg by mouth daily as needed for mild constipation.  . Dorzolamide HCl-Timolol Mal PF 22.3-6.8 MG/ML SOLN Place 1 drop into both eyes 2 (two) times daily.   . fenofibrate (TRICOR) 48 MG tablet Take 48 mg by mouth daily.  Marland Kitchen loratadine  (CLARITIN) 10 MG tablet Take 10 mg by mouth daily as needed for allergies.   Marland Kitchen meclizine (ANTIVERT) 25 MG tablet Take 1 tablet (25 mg total) by mouth 2 (two) times daily as needed for dizziness.  Marland Kitchen MELATONIN PO Take 1 tablet by mouth at bedtime as needed (sleep).   . Multiple Vitamin (MULTIVITAMIN) capsule Take 1 capsule by mouth daily.    . Multiple Vitamins-Minerals (ICAPS AREDS FORMULA PO) Take 1 capsule by mouth daily.  Marland Kitchen omeprazole (PRILOSEC) 20 MG capsule Take 20 mg by mouth daily.    Marland Kitchen senna (SENOKOT) 8.6 MG tablet Take 1 tablet by mouth daily as needed for constipation.  . simvastatin (ZOCOR) 10 MG tablet Take 1 tablet (10 mg total) by mouth at bedtime.  . tamsulosin (FLOMAX) 0.4 MG CAPS capsule Take 1 capsule (  0.4 mg total) by mouth daily after supper.     Allergies:   Ace inhibitors; Aspirin; and Spironolactone   Social History   Tobacco Use  . Smoking status: Former Research scientist (life sciences)  . Smokeless tobacco: Never Used  . Tobacco comment: quit in 1953  Substance Use Topics  . Alcohol use: No  . Drug use: No     Family Hx: The patient's family history includes CAD in an other family member; Kidney Stones in his son; Lung cancer in his father. There is no history of Prostate cancer, Diabetes, or Colon cancer.  ROS:   Please see the history of present illness.     All other systems reviewed and are negative.   Prior CV studies:   The following studies were reviewed today:  NST 2017 Study Highlights    The left ventricular ejection fraction is moderately decreased (30-44%).  Nuclear stress EF: 33%. There is severe diffuse hypokinesis and inferolateral akinesis.  Defect 1: There is a medium fixed defect of moderate severity present in the basal anterolateral, mid anterolateral and apical lateral location. This is consistent with infarct. No ischemia noted.  Defect 2: There is a fixed medium defect of severe severity present in the basal inferior, basal inferolateral, mid  inferior and mid inferolateral location. This is consistent with infarct. No ischemia noted.  Defect 3: There is a small fixed defect of mild severity present in the apical septal location. This is consistent with infarct. No ischemia noted.  This is a high risk study.  Findings consistent with prior myocardial infarction.       Labs/Other Tests and Data Reviewed:    EKG:  An ECG dated 06/2018 was personally reviewed today and demonstrated:  NSR, incomplete RBBB  Recent Labs: 06/30/2018: ALT 12; Hemoglobin 14.0; Platelets 147 10/28/2018: BUN 14; Creatinine, Ser 0.96; Potassium 4.8; Sodium 138   Recent Lipid Panel Lab Results  Component Value Date/Time   CHOL 115 10/28/2018 03:50 PM   TRIG 172.0 (H) 10/28/2018 03:50 PM   HDL 35.60 (L) 10/28/2018 03:50 PM   CHOLHDL 3 10/28/2018 03:50 PM   LDLCALC 45 10/28/2018 03:50 PM   LDLDIRECT 57.3 07/29/2014 08:20 AM    Wt Readings from Last 3 Encounters:  03/30/19 153 lb (69.4 kg)  10/28/18 151 lb 2 oz (68.5 kg)  10/07/18 146 lb (66.2 kg)     Objective:    Vital Signs:  BP 129/70   Pulse 66   Temp (!) 97.5 F (36.4 C)   Ht 5\' 8"  (1.727 m)   Wt 153 lb (69.4 kg)   BMI 23.26 kg/m    Physical Exam: well sounding male in no acute distress. Speaking in clear complete sentences. Speech unlabored. Breathing unlabored.   ASSESSMENT & PLAN:    1. CAD: history of remote inferior wall MI treated with PTCA. His last Myoview was 2017 and showed inferior scar without ischemia. He has been doing well w/o CP or dyspnea. Continue medical management.   2. AAA: s/p AAA repair in 2003.  3. HTN: controlled on current regimen. 129/70 today. On amlodipine and atenolol. HR 66. No changes made today.   4. HLD: on statin. Well controlled. LDL was 45 on last FLP 10/2018. No side effects w/ statin. Appears to have good cognitive function based on phone call.    COVID-19 Education: The signs and symptoms of COVID-19 were discussed with the patient  and how to seek care for testing (follow up with PCP or arrange E-visit).  The importance of social distancing was discussed today.  Time:   Today, I have spent 15 minutes with the patient with telehealth technology discussing the above problems.     Medication Adjustments/Labs and Tests Ordered: Current medicines are reviewed at length with the patient today.  Concerns regarding medicines are outlined above.   Tests Ordered: No orders of the defined types were placed in this encounter.   Medication Changes: No orders of the defined types were placed in this encounter.   Disposition:  Follow up in 6 months w/ Dr. Meda Coffee   Signed, Lyda Jester, PA-C  03/30/2019 10:04 AM    [place]

## 2019-03-30 NOTE — Patient Instructions (Signed)
Medication Instructions:  none If you need a refill on your cardiac medications before your next appointment, please call your pharmacy.   Lab work: none If you have labs (blood work) drawn today and your tests are completely normal, you will receive your results only by: Marland Kitchen MyChart Message (if you have MyChart) OR . A paper copy in the mail If you have any lab test that is abnormal or we need to change your treatment, we will call you to review the results.  Testing/Procedures: none  Follow-Up:  6 months with Dr Meda Coffee At [facility], you and your health needs are our priority.  As part of our continuing mission to provide you with exceptional heart care, we have created designated Provider Care Teams.  These Care Teams include your primary Cardiologist (physician) and Advanced Practice Providers (APPs -  Physician Assistants and Nurse Practitioners) who all work together to provide you with the care you need, when you need it. Any Other Special Instructions Will Be Listed Below (If Applicable).

## 2019-04-14 ENCOUNTER — Emergency Department (HOSPITAL_COMMUNITY): Payer: Medicare Other

## 2019-04-14 ENCOUNTER — Other Ambulatory Visit: Payer: Self-pay

## 2019-04-14 ENCOUNTER — Emergency Department (HOSPITAL_COMMUNITY)
Admission: EM | Admit: 2019-04-14 | Discharge: 2019-04-14 | Disposition: A | Discharge status: Home / Self Care | Payer: Medicare Other | Attending: Emergency Medicine | Admitting: Emergency Medicine

## 2019-04-14 ENCOUNTER — Telehealth: Payer: Self-pay | Admitting: Cardiology

## 2019-04-14 DIAGNOSIS — R0789 Other chest pain: Secondary | ICD-10-CM | POA: Diagnosis not present

## 2019-04-14 DIAGNOSIS — Z87891 Personal history of nicotine dependence: Secondary | ICD-10-CM | POA: Diagnosis not present

## 2019-04-14 DIAGNOSIS — W19XXXA Unspecified fall, initial encounter: Secondary | ICD-10-CM | POA: Diagnosis not present

## 2019-04-14 DIAGNOSIS — Z7982 Long term (current) use of aspirin: Secondary | ICD-10-CM | POA: Diagnosis not present

## 2019-04-14 DIAGNOSIS — R9431 Abnormal electrocardiogram [ECG] [EKG]: Secondary | ICD-10-CM | POA: Diagnosis not present

## 2019-04-14 DIAGNOSIS — Y929 Unspecified place or not applicable: Secondary | ICD-10-CM | POA: Diagnosis not present

## 2019-04-14 DIAGNOSIS — R0602 Shortness of breath: Secondary | ICD-10-CM | POA: Diagnosis not present

## 2019-04-14 DIAGNOSIS — Z79899 Other long term (current) drug therapy: Secondary | ICD-10-CM | POA: Diagnosis not present

## 2019-04-14 DIAGNOSIS — I5022 Chronic systolic (congestive) heart failure: Secondary | ICD-10-CM | POA: Insufficient documentation

## 2019-04-14 DIAGNOSIS — I110 Hypertensive heart disease with heart failure: Secondary | ICD-10-CM | POA: Diagnosis not present

## 2019-04-14 DIAGNOSIS — Y939 Activity, unspecified: Secondary | ICD-10-CM | POA: Insufficient documentation

## 2019-04-14 DIAGNOSIS — Y999 Unspecified external cause status: Secondary | ICD-10-CM | POA: Insufficient documentation

## 2019-04-14 DIAGNOSIS — I2510 Atherosclerotic heart disease of native coronary artery without angina pectoris: Secondary | ICD-10-CM | POA: Diagnosis not present

## 2019-04-14 DIAGNOSIS — R079 Chest pain, unspecified: Secondary | ICD-10-CM | POA: Diagnosis not present

## 2019-04-14 DIAGNOSIS — R05 Cough: Secondary | ICD-10-CM | POA: Diagnosis not present

## 2019-04-14 LAB — BASIC METABOLIC PANEL
Anion gap: 10 (ref 5–15)
BUN: 9 mg/dL (ref 8–23)
CO2: 25 mmol/L (ref 22–32)
Calcium: 8.8 mg/dL — ABNORMAL LOW (ref 8.9–10.3)
Chloride: 103 mmol/L (ref 98–111)
Creatinine, Ser: 0.97 mg/dL (ref 0.61–1.24)
GFR calc Af Amer: >60 mL/min (ref >60)
GFR calc non Af Amer: >60 mL/min (ref >60)
Glucose, Bld: 111 mg/dL — ABNORMAL HIGH (ref 70–99)
Potassium: 3.8 mmol/L (ref 3.5–5.1)
Sodium: 138 mmol/L (ref 135–145)

## 2019-04-14 LAB — TROPONIN I
Troponin I: <0.03 ng/mL (ref <0.03)
Troponin I: <0.03 ng/mL (ref <0.03)

## 2019-04-14 LAB — CBC
HCT: 42.2 % (ref 39.0–52.0)
Hemoglobin: 13.5 g/dL (ref 13.0–17.0)
MCH: 31.3 pg (ref 26.0–34.0)
MCHC: 32 g/dL (ref 30.0–36.0)
MCV: 97.9 fL (ref 80.0–100.0)
Platelets: 155 10*3/uL (ref 150–400)
RBC: 4.31 MIL/uL (ref 4.22–5.81)
RDW: 13.6 % (ref 11.5–15.5)
WBC: 7.1 10*3/uL (ref 4.0–10.5)
nRBC: 0 % (ref 0.0–0.2)

## 2019-04-14 MED ORDER — SODIUM CHLORIDE 0.9% FLUSH: 3.0000 mL | Freq: Once | INTRAVENOUS | Status: DC

## 2019-04-14 MED ORDER — ACETAMINOPHEN 325 MG PO TABS
325.0000 mg | ORAL_TABLET | Freq: Once | ORAL | Status: AC
  Administered 2019-04-14: 325 mg via ORAL
  Filled 2019-04-14: qty 1

## 2019-04-14 MED ORDER — DICLOFENAC EPOLAMINE 1.3 % TD PTCH
1.0000 | MEDICATED_PATCH | Freq: Two times a day (BID) | TRANSDERMAL | Status: DC
  Administered 2019-04-14: 13:00:00 1 via TRANSDERMAL
  Filled 2019-04-14 (×2): qty 1

## 2019-04-14 MED ORDER — DICLOFENAC EPOLAMINE 1.3 % TD PTCH: 1.0000 | MEDICATED_PATCH | Freq: Two times a day (BID) | TRANSDERMAL | 1 refills | Status: DC

## 2019-04-14 NOTE — ED Provider Notes (Signed)
[facility] EMERGENCY DEPARTMENT Provider Note   CSN: [id number] Arrival date & time: 04/14/19  0762    History   Chief Complaint Chief Complaint  Patient presents with  . Chest Pain    HPI Christopher Hogan is a 85 y.o. male.     HPI Patient presents with concern of chest pain. Patient has had chest pain for greater than 1 month, but notes that it is been essentially unchanged, but had a mechanical fall yesterday. He notes that since that fall he has had more pain in the left lower chest wall, worse with activity, worse with twisting, minimal at rest. No dyspnea, no fever, no cough. He notes that he is generally well, and active 85 year old male.  Past Medical History:  Diagnosis Date  . AAA (abdominal aortic aneurysm) ([place])    repaired 02/2002, renal ultrasound, Sept 2010: normal abdominal aorta, normal renal arteries   . Age-related macular degeneration, wet, left eye ([place])   . Allergic rhinitis   . Arthritis   . At risk for pulmonary fibrosis 06/25/2018  . CAD (coronary artery disease)    stable stress test 7/11  . CHF (congestive heart failure) ([place])   . Chronic systolic CHF (congestive heart failure) ([place]) 12/17/2013  . Coronary atherosclerosis 06/09/2007   H/O inferior wall MI- PTCA- 1989 Last stress test 2/17- inferior scar, no ischemia    . DEGENERATIVE JOINT DISEASE 03/17/2010   Qualifier: Diagnosis of  By: Dawson Bills    . Dyslipidemia 05/06/2007   LDL 59 Jan 2019     . Eczema 07/09/2013  . ED (erectile dysfunction)   . Epidermoid cyst of skin of chest 09/29/2018  . ERECTILE DYSFUNCTION 12/09/2007   Qualifier: Diagnosis of  By: Larose Kells MD, [place] hypertension 04/30/2007   Qualifier: Diagnosis of  By: Larose Kells MD, [place] GERD 07/21/2008   Qualifier: Diagnosis of  By: Larose Kells MD, [place] GERD (gastroesophageal reflux disease)   . Glaucoma and macular degeneration 12/31/2011  . Hyperlipidemia   . Hypertension   . Inguinal hernia  12/31/2011  . Insomnia 02/22/2018  . Ischemic cardiomyopathy 06/23/2018   Last EF 30-44% by Myoview Feb 2017  . Left inguinal hernia 06/30/2018  . Myocardial infarction ([place]) 1989   interior wall infarction  . PCP notes >>>>>>>>>>>>>>>>>>>> 07/17/2016  . Pneumonia 2019  . Pre-operative cardiovascular examination 12/31/2011  . Rhinitis 04/20/2008   Qualifier: Diagnosis of  By: Larose Kells MD, [place] Vertigo 06/20/2015    Patient Active Problem List   Diagnosis Date Noted  . Epidermoid cyst of skin of chest 09/29/2018  . Left inguinal hernia 06/30/2018  . At risk for pulmonary fibrosis 06/25/2018  . Ischemic cardiomyopathy 06/23/2018  . Insomnia 02/22/2018  . PCP notes >>>>>>>>>>>>>>>>>>>> 07/17/2016  . Vertigo 06/20/2015  . Chronic systolic CHF (congestive heart failure) ([place]) 12/17/2013  . Eczema 07/09/2013  . Annual physical exam 12/31/2011  . Inguinal hernia 12/31/2011  . Glaucoma and macular degeneration 12/31/2011  . DEGENERATIVE JOINT DISEASE 03/17/2010  . GERD 07/21/2008  . Rhinitis 04/20/2008  . ERECTILE DYSFUNCTION 12/09/2007  . Coronary atherosclerosis 06/09/2007  . Dyslipidemia 05/06/2007  . Essential hypertension 04/30/2007    Past Surgical History:  Procedure Laterality Date  . AAA repair  2003  . CARDIAC CATHETERIZATION    . CHOLECYSTECTOMY    . INGUINAL HERNIA REPAIR Right 1985  . INGUINAL HERNIA REPAIR Left 06/30/2018  open; w/mesh  . INGUINAL HERNIA REPAIR Left 06/30/2018   Procedure: OPEN LEFT INGUINAL HERNIA REPAIR;  Surgeon: Fanny Skates, MD;  Location: [facility];  Service: General;  Laterality: Left;  . INSERTION OF MESH Left 06/30/2018   Procedure: INSERTION OF MESH;  Surgeon: Fanny Skates, MD;  Location: [facility];  Service: General;  Laterality: Left;  Marland Kitchen MASS EXCISION N/A 09/29/2018   Procedure: EXCISION OF 3 CM MASS ANTERIOR CHEST WALL;  Surgeon: Fanny Skates, MD;  Location: [facility];  Service: General;  Laterality: N/A;  . [place] Medications    Prior to Admission medications   Medication Sig Start Date End Date Taking? Authorizing Provider  amitriptyline (ELAVIL) 10 MG tablet Take 1 tablet (10 mg total) by mouth at bedtime as needed for sleep. 02/21/18   Colon Branch, MD  amLODipine (NORVASC) 2.5 MG tablet Take 1 tablet (2.5 mg total) by mouth daily. 10/29/16 03/30/19  Dorothy Spark, MD  aspirin 81 MG chewable tablet Chew 81 mg by mouth daily.     [provider]  atenolol (TENORMIN) 50 MG tablet Take 1 tablet (50 mg total) by mouth daily. 07/01/18   Fanny Skates, MD  azelastine (ASTELIN) 0.1 % nasal spray Place 2 sprays into both nostrils 2 (two) times daily. Use in each nostril as directed 06/28/17   Colon Branch, MD  dextromethorphan-guaiFENesin [facility] DM) 30-600 MG 12hr tablet Take 1 tablet by mouth 2 (two) times daily.    [provider]  diclofenac (FLECTOR) 1.3 % PTCH Place 1 patch onto the skin 2 (two) times daily. 04/14/19   Carmin Muskrat, MD  docusate sodium (COLACE) 100 MG capsule Take 100 mg by mouth daily as needed for mild constipation.    [provider]  Dorzolamide HCl-Timolol Mal PF 22.3-6.8 MG/ML SOLN Place 1 drop into both eyes 2 (two) times daily.     [provider]  fenofibrate (TRICOR) 48 MG tablet Take 48 mg by mouth daily.    [provider]  loratadine (CLARITIN) 10 MG tablet Take 10 mg by mouth daily as needed for allergies.     [provider]  meclizine (ANTIVERT) 25 MG tablet Take 1 tablet (25 mg total) by mouth 2 (two) times daily as needed for dizziness. 06/20/15   Colon Branch, MD  MELATONIN PO Take 1 tablet by mouth at bedtime as needed (sleep).     [provider]  Multiple Vitamin (MULTIVITAMIN) capsule Take 1 capsule by mouth daily.      [provider]  Multiple Vitamins-Minerals (ICAPS AREDS FORMULA PO) Take 1 capsule by mouth daily.    [provider]  omeprazole (PRILOSEC) 20  MG capsule Take 20 mg by mouth daily.      [provider]  senna (SENOKOT) 8.6 MG tablet Take 1 tablet by mouth daily as needed for constipation.    [provider]  simvastatin (ZOCOR) 10 MG tablet Take 1 tablet (10 mg total) by mouth at bedtime. 05/14/12   Colon Branch, MD  tamsulosin (FLOMAX) 0.4 MG CAPS capsule Take 1 capsule (0.4 mg total) by mouth daily after supper. 03/27/19   Colon Branch, MD    Family History Family History  Problem Relation Age of Onset  . Lung cancer Father   . CAD Other        mother?  . Kidney Stones Son   . Prostate  cancer Neg Hx   . Diabetes Neg Hx   . Colon cancer Neg Hx     Social History Social History   Tobacco Use  . Smoking status: Former Research scientist (life sciences)  . Smokeless tobacco: Never Used  . Tobacco comment: quit in 1953  Substance Use Topics  . Alcohol use: No  . Drug use: No     Allergies   Ace inhibitors; Aspirin; and Spironolactone   Review of Systems Review of Systems  Constitutional:       Per HPI, otherwise negative  HENT:       Per HPI, otherwise negative  Respiratory:       Per HPI, otherwise negative  Cardiovascular:       Per HPI, otherwise negative  Gastrointestinal: Negative for vomiting.  Endocrine:       Negative aside from HPI  Genitourinary:       Neg aside from HPI   Musculoskeletal:       Per HPI, otherwise negative  Skin: Negative.   Neurological: Negative for syncope.     Physical Exam Updated Vital Signs BP (!) 111/99   Pulse 63   Temp 97.9 F (36.6 C) (Oral)   Resp 18   Ht 5\' 8"  (1.727 m)   Wt 69.4 kg   SpO2 99%   BMI 23.26 kg/m   Physical Exam Vitals signs and nursing note reviewed.  Constitutional:      General: He is not in acute distress.    Appearance: He is well-developed.  HENT:     Head: Normocephalic and atraumatic.  Eyes:     Conjunctiva/sclera: Conjunctivae normal.  Cardiovascular:     Rate and Rhythm: Normal rate and regular rhythm.  Pulmonary:     Effort:  Pulmonary effort is normal. No respiratory distress.     Breath sounds: No stridor.  Abdominal:     General: There is no distension.  Skin:    General: Skin is warm and dry.  Neurological:     Mental Status: He is alert and oriented to person, place, and time.      ED Treatments / Results  Labs (all labs ordered are listed, but only abnormal results are displayed) Labs Reviewed  BASIC METABOLIC PANEL - Abnormal; Notable for the following components:      Result Value   Glucose, Bld 111 (*)    Calcium 8.8 (*)    All other components within normal limits  CBC  TROPONIN I  TROPONIN I    EKG EKG Interpretation  Date/Time:  Tuesday Apr 14 2019 09:58:47 EDT Ventricular Rate:  63 PR Interval:  182 QRS Duration: 116 QT Interval:  436 QTC Calculation: 446 R Axis:   27 Text Interpretation:  Sinus rhythm with frequent and consecutive Premature ventricular complexes and Fusion complexes Left ventricular hypertrophy with QRS widening Non-specific intra-ventricular conduction delay T wave abnormality similar to prior, though w more PVC Abnormal ekg Confirmed by Carmin Muskrat [phone]) on 04/14/2019 1:18:03 PM   Radiology Dg Chest 2 View  Result Date: 04/14/2019 CLINICAL DATA:  Left anterior chest pain since falling last month. Shortness of breath with cough/cold symptoms for 2-3 weeks. EXAM: CHEST - 2 VIEW COMPARISON:  Radiographs 11/17/2017 and 02/21/2018. FINDINGS: The heart size and mediastinal contours are stable with aortic and great vessel atherosclerosis. There is chronic scarring at both lung bases. No superimposed edema, confluent airspace opacity, pleural effusion or pneumothorax. No acute osseous findings are evident. There are degenerative changes at both shoulders with  probable bilateral chronic rotator cuff impingement. IMPRESSION: Stable chest.  No acute cardiopulmonary process. Electronically Signed   By: Richardean Sale M.D.   On: 04/14/2019 10:53    Procedures  Procedures (including critical care time)  Medications Ordered in ED Medications  sodium chloride flush (NS) 0.9 % injection 3 mL (has no administration in time range)  diclofenac (FLECTOR) 1.3 % 1 patch (1 patch Transdermal Patch Applied 04/14/19 1245)  acetaminophen (TYLENOL) tablet 325 mg (325 mg Oral Given 04/14/19 1245)     Initial Impression / Assessment and Plan / ED Course  I have reviewed the triage vital signs and the nursing notes.  Pertinent labs & imaging results that were available during my care of the patient were reviewed by me and considered in my medical decision making (see chart for details).        4:05 PM Patient in no distress, awake and alert. On previous exams the patient had been in similar condition. Now, with 2 normal troponins, nonischemic EKG, and following pain that developed following a fall, there is no evidence for ACS, no evidence for PE, pneumonia, with no oxygen requirement, no fever, no cough. Some suspicion for minor trauma, with either occult fracture versus bruise contributing to his chest pain. Patient discharged with topical analgesia, outpatient follow-up.  Final Clinical Impressions(s) / ED Diagnoses   Final diagnoses:  Atypical chest pain    ED Discharge Orders         Ordered    diclofenac (FLECTOR) 1.3 % PTCH  2 times daily     04/14/19 1604           Carmin Muskrat, MD 04/14/19 1606

## 2019-04-14 NOTE — ED Notes (Signed)
Pt ambulatory to restroom

## 2019-04-14 NOTE — ED Notes (Signed)
This RN acting as Art therapist and asked pt if he would like for me to contact family/friends. Pt requested that his wife, [name] be updated. I spoke with [facility] and informed her on pt's care thus far in the ED. Will try to call back once we know disposition.

## 2019-04-14 NOTE — Telephone Encounter (Signed)
New Message    Pt is calling and says he fell a couple of weeks ago and hurt his rib cage and thought his chest pain was from that but he is still having chest pain    Pt c/o of Chest Pain: STAT if CP now or developed within 24 hours  1. Are you having CP right now? No while sitting he doesn't but when he gets up he has the chest pain   2. Are you experiencing any other symptoms (ex. SOB, nausea, vomiting, sweating)? Dizziness when he stands up   3. How long have you been experiencing CP? Since he fell a couple of weeks ago  He says he feels really bad and has no energy   4. Is your CP continuous or coming and going? Comes and goes   5. Have you taken Nitroglycerin? No?

## 2019-04-14 NOTE — Discharge Instructions (Signed)
As discussed, your evaluation today has been largely reassuring.  But, it is important that you monitor your condition carefully, and do not hesitate to return to the ED if you develop new, or concerning changes in your condition.  Your pain is likely due to bruised or minimally cracked ribs.  Otherwise, please follow-up with your physician for appropriate ongoing care.

## 2019-04-14 NOTE — ED Triage Notes (Signed)
[place] while working in yard a couple weeks ago, landed on left arm, hurt in rib cage, now pain is getting worse in rib area. No bruising present.

## 2019-04-14 NOTE — Telephone Encounter (Signed)
I spoke to patient who called because he continues to have Chest Pain.  He had fallen recently and thought that rib injury was causing this chest pain, but it has gotten worse the past few days and he is becoming SOB more frequently.  I advised him to go to the ED for further evaluation.  He verbalized understanding and will keep [place] updated.

## 2019-04-15 ENCOUNTER — Ambulatory Visit: Payer: Medicare Other | Admitting: Cardiology

## 2019-04-24 ENCOUNTER — Other Ambulatory Visit: Payer: Self-pay

## 2019-04-24 ENCOUNTER — Encounter (INDEPENDENT_AMBULATORY_CARE_PROVIDER_SITE_OTHER): Payer: Medicare Other | Admitting: Ophthalmology

## 2019-04-24 DIAGNOSIS — I10 Essential (primary) hypertension: Secondary | ICD-10-CM | POA: Diagnosis not present

## 2019-04-24 DIAGNOSIS — H35033 Hypertensive retinopathy, bilateral: Secondary | ICD-10-CM | POA: Diagnosis not present

## 2019-04-24 DIAGNOSIS — H43813 Vitreous degeneration, bilateral: Secondary | ICD-10-CM

## 2019-04-24 DIAGNOSIS — H353231 Exudative age-related macular degeneration, bilateral, with active choroidal neovascularization: Secondary | ICD-10-CM | POA: Diagnosis not present

## 2019-05-18 DIAGNOSIS — H353222 Exudative age-related macular degeneration, left eye, with inactive choroidal neovascularization: Secondary | ICD-10-CM | POA: Diagnosis not present

## 2019-05-18 DIAGNOSIS — H401121 Primary open-angle glaucoma, left eye, mild stage: Secondary | ICD-10-CM | POA: Diagnosis not present

## 2019-05-18 DIAGNOSIS — H401113 Primary open-angle glaucoma, right eye, severe stage: Secondary | ICD-10-CM | POA: Diagnosis not present

## 2019-05-18 DIAGNOSIS — Z961 Presence of intraocular lens: Secondary | ICD-10-CM | POA: Diagnosis not present

## 2019-05-29 ENCOUNTER — Encounter (INDEPENDENT_AMBULATORY_CARE_PROVIDER_SITE_OTHER): Payer: Medicare Other | Admitting: Ophthalmology

## 2019-05-29 ENCOUNTER — Other Ambulatory Visit: Payer: Self-pay

## 2019-05-29 DIAGNOSIS — H353231 Exudative age-related macular degeneration, bilateral, with active choroidal neovascularization: Secondary | ICD-10-CM

## 2019-05-29 DIAGNOSIS — H35033 Hypertensive retinopathy, bilateral: Secondary | ICD-10-CM

## 2019-05-29 DIAGNOSIS — H43813 Vitreous degeneration, bilateral: Secondary | ICD-10-CM | POA: Diagnosis not present

## 2019-05-29 DIAGNOSIS — I10 Essential (primary) hypertension: Secondary | ICD-10-CM

## 2019-06-17 DIAGNOSIS — H401121 Primary open-angle glaucoma, left eye, mild stage: Secondary | ICD-10-CM | POA: Diagnosis not present

## 2019-06-17 DIAGNOSIS — H401113 Primary open-angle glaucoma, right eye, severe stage: Secondary | ICD-10-CM | POA: Diagnosis not present

## 2019-07-02 ENCOUNTER — Other Ambulatory Visit: Payer: Self-pay

## 2019-07-02 ENCOUNTER — Encounter (INDEPENDENT_AMBULATORY_CARE_PROVIDER_SITE_OTHER): Payer: Medicare Other | Admitting: Ophthalmology

## 2019-07-02 DIAGNOSIS — H353231 Exudative age-related macular degeneration, bilateral, with active choroidal neovascularization: Secondary | ICD-10-CM

## 2019-07-02 DIAGNOSIS — I10 Essential (primary) hypertension: Secondary | ICD-10-CM | POA: Diagnosis not present

## 2019-07-02 DIAGNOSIS — H35033 Hypertensive retinopathy, bilateral: Secondary | ICD-10-CM | POA: Diagnosis not present

## 2019-07-02 DIAGNOSIS — H43813 Vitreous degeneration, bilateral: Secondary | ICD-10-CM

## 2019-07-07 ENCOUNTER — Telehealth: Payer: Self-pay | Admitting: Internal Medicine

## 2019-07-07 DIAGNOSIS — Z0120 Encounter for dental examination and cleaning without abnormal findings: Secondary | ICD-10-CM | POA: Diagnosis not present

## 2019-07-07 NOTE — Telephone Encounter (Signed)
Spoke w/ Jan- informed her that Pt only on baby aspirin per med list.

## 2019-07-07 NOTE — Telephone Encounter (Signed)
Jan from Dr Barrie Dunker office called and stated that the patient is having some teeth extracted and would like to know if the patient is on any blood thinners. Please advise

## 2019-07-29 ENCOUNTER — Other Ambulatory Visit: Payer: Self-pay

## 2019-07-29 ENCOUNTER — Telehealth: Payer: Self-pay | Admitting: *Deleted

## 2019-07-29 ENCOUNTER — Ambulatory Visit (INDEPENDENT_AMBULATORY_CARE_PROVIDER_SITE_OTHER): Payer: Medicare Other | Admitting: Internal Medicine

## 2019-07-29 ENCOUNTER — Encounter: Payer: Self-pay | Admitting: Internal Medicine

## 2019-07-29 VITALS — BP 142/63 | HR 58 | Temp 96.9°F | Resp 18 | Ht 68.0 in | Wt 149.4 lb

## 2019-07-29 DIAGNOSIS — H6121 Impacted cerumen, right ear: Secondary | ICD-10-CM | POA: Diagnosis not present

## 2019-07-29 DIAGNOSIS — R399 Unspecified symptoms and signs involving the genitourinary system: Secondary | ICD-10-CM | POA: Diagnosis not present

## 2019-07-29 DIAGNOSIS — I10 Essential (primary) hypertension: Secondary | ICD-10-CM

## 2019-07-29 DIAGNOSIS — E785 Hyperlipidemia, unspecified: Secondary | ICD-10-CM | POA: Diagnosis not present

## 2019-07-29 DIAGNOSIS — R7989 Other specified abnormal findings of blood chemistry: Secondary | ICD-10-CM | POA: Diagnosis not present

## 2019-07-29 DIAGNOSIS — Z23 Encounter for immunization: Secondary | ICD-10-CM

## 2019-07-29 LAB — COMPREHENSIVE METABOLIC PANEL
ALT: 11 U/L (ref 0–53)
AST: 12 U/L (ref 0–37)
Albumin: 4 g/dL (ref 3.5–5.2)
Alkaline Phosphatase: 47 U/L (ref 39–117)
BUN: 11 mg/dL (ref 6–23)
CO2: 30 mEq/L (ref 19–32)
Calcium: 8.8 mg/dL (ref 8.4–10.5)
Chloride: 102 mEq/L (ref 96–112)
Creatinine, Ser: 0.98 mg/dL (ref 0.40–1.50)
GFR: 71.56 mL/min (ref >60.00)
Glucose, Bld: 91 mg/dL (ref 70–99)
Potassium: 4.3 mEq/L (ref 3.5–5.1)
Sodium: 137 mEq/L (ref 135–145)
Total Bilirubin: 0.6 mg/dL (ref 0.2–1.2)
Total Protein: 6.3 g/dL (ref 6.0–8.3)

## 2019-07-29 LAB — PSA: PSA: 5.77 ng/mL — ABNORMAL HIGH (ref 0.10–4.00)

## 2019-07-29 LAB — TSH: TSH: 4.15 u[IU]/mL (ref 0.35–4.50)

## 2019-07-29 MED ORDER — IPRATROPIUM BROMIDE 0.06 % NA SOLN: 2.0000 | Freq: Four times a day (QID) | NASAL | 12 refills | Status: DC

## 2019-07-29 NOTE — Progress Notes (Signed)
Subjective:    Patient ID: Christopher Hogan, male    DOB: 10-01-1925, 85 y.o.   MRN: [id number]  DOS:  07/29/2019 Type of visit - description: Routine visit LUTS: Since the last visit, he started Flomax, nocturia has decreased but he still goes to urinate every hour Went to the ER 04/14/2019, records reviewed, had chest pain, work-up negative. Continue with sinus congestion, blowing constantly, significant postnasal dripping that creates cough. He is certain that the cough is from postnasal dripping and he does not produce sputum,what  he coughs up is pooled mucus in the throat.   Review of Systems  Denies fever chills No chest pain.  No difficulty breathing with ADLs No edema No wheezing or chest congestion Admits that the urinary flow is slow but denies dysuria, gross hematuria.  Occasionally has suprapubic discomfort   Past Medical History:  Diagnosis Date  . AAA (abdominal aortic aneurysm) ([place])    repaired 02/2002, renal ultrasound, Sept 2010: normal abdominal aorta, normal renal arteries   . Age-related macular degeneration, wet, left eye ([place])   . Allergic rhinitis   . Arthritis   . At risk for pulmonary fibrosis 06/25/2018  . CAD (coronary artery disease)    stable stress test 7/11  . CHF (congestive heart failure) ([place])   . Chronic systolic CHF (congestive heart failure) ([place]) 12/17/2013  . Coronary atherosclerosis 06/09/2007   H/O inferior wall MI- PTCA- 1989 Last stress test 2/17- inferior scar, no ischemia    . DEGENERATIVE JOINT DISEASE 03/17/2010   Qualifier: Diagnosis of  By: Dawson Bills    . Dyslipidemia 05/06/2007   LDL 59 Jan 2019     . Eczema 07/09/2013  . ED (erectile dysfunction)   . Epidermoid cyst of skin of chest 09/29/2018  . ERECTILE DYSFUNCTION 12/09/2007   Qualifier: Diagnosis of  By: Larose Kells MD, [place] hypertension 04/30/2007   Qualifier: Diagnosis of  By: Larose Kells MD, [place] GERD 07/21/2008   Qualifier: Diagnosis of  By: Larose Kells MD, [place] GERD (gastroesophageal reflux disease)   . Glaucoma and macular degeneration 12/31/2011  . Hyperlipidemia   . Hypertension   . Inguinal hernia 12/31/2011  . Insomnia 02/22/2018  . Ischemic cardiomyopathy 06/23/2018   Last EF 30-44% by Myoview Feb 2017  . Left inguinal hernia 06/30/2018  . Myocardial infarction ([place]) 1989   interior wall infarction  . PCP notes >>>>>>>>>>>>>>>>>>>> 07/17/2016  . Pneumonia 2019  . Pre-operative cardiovascular examination 12/31/2011  . Rhinitis 04/20/2008   Qualifier: Diagnosis of  By: Larose Kells MD, [place] Vertigo 06/20/2015    Past Surgical History:  Procedure Laterality Date  . AAA repair  2003  . CARDIAC CATHETERIZATION    . CHOLECYSTECTOMY    . INGUINAL HERNIA REPAIR Right 1985  . INGUINAL HERNIA REPAIR Left 06/30/2018   open; w/mesh  . INGUINAL HERNIA REPAIR Left 06/30/2018   Procedure: OPEN LEFT INGUINAL HERNIA REPAIR;  Surgeon: Fanny Skates, MD;  Location: [facility];  Service: General;  Laterality: Left;  . INSERTION OF MESH Left 06/30/2018   Procedure: INSERTION OF MESH;  Surgeon: Fanny Skates, MD;  Location: [facility];  Service: General;  Laterality: Left;  [place] Kitchen MASS EXCISION N/A 09/29/2018   Procedure: EXCISION OF 3 CM MASS ANTERIOR CHEST WALL;  Surgeon: Fanny Skates, MD;  Location: [facility];  Service: General;  Laterality: N/A;  . [place]  Social History   Socioeconomic History  . Marital status: Married    Spouse name: Not on file  . Number of children: 3  . Years of education: Not on file  . Highest education level: Not on file  Occupational History  . Occupation: retired     Fish farm manager: RETIRED  Social Needs  . Financial resource strain: Not on file  . Food insecurity    Worry: Not on file    Inability: Not on file  . Transportation needs    Medical: Not on file    Non-medical: Not on file  Tobacco Use  . Smoking status: Former Research scientist (life sciences)  . Smokeless tobacco: Never Used  . Tobacco comment: quit in 1953   Substance and Sexual Activity  . Alcohol use: No  . Drug use: No  . Sexual activity: Not Currently  Lifestyle  . Physical activity    Days per week: Not on file    Minutes per session: Not on file  . Stress: Not on file  Relationships  . Social Herbalist on phone: Not on file    Gets together: Not on file    Attends religious service: Not on file    Active member of club or organization: Not on file    Attends meetings of clubs or organizations: Not on file    Relationship status: Not on file  . Intimate partner violence    Fear of current or ex partner: Not on file    Emotionally abused: Not on file    Physically abused: Not on file    Forced sexual activity: Not on file  Other Topics Concern  . Not on file  Social History Narrative   WWII veteran   Independent on his ADL     Lives w/ wife      Allergies as of 07/29/2019      Reactions   Ace Inhibitors Cough   Aspirin Nausea Only, Other (See Comments)   Full strength only   Spironolactone Rash      Medication List       Accurate as of July 29, 2019 11:59 PM. If you have any questions, ask your nurse or doctor.        amitriptyline 10 MG tablet Commonly known as: ELAVIL Take 1 tablet (10 mg total) by mouth at bedtime as needed for sleep.   amLODipine 2.5 MG tablet Commonly known as: NORVASC Take 1 tablet (2.5 mg total) by mouth daily.   aspirin 81 MG chewable tablet Chew 81 mg by mouth daily.   atenolol 50 MG tablet Commonly known as: TENORMIN Take 1 tablet (50 mg total) by mouth daily.   azelastine 0.1 % nasal spray Commonly known as: ASTELIN Place 2 sprays into both nostrils 2 (two) times daily. Use in each nostril as directed   Claritin 10 MG tablet Generic drug: loratadine Take 10 mg by mouth daily as needed for allergies.   dextromethorphan-guaiFENesin 30-600 MG 12hr tablet Commonly known as: MUCINEX DM Take 1 tablet by mouth 2 (two) times daily.   diclofenac 1.3 % Ptch  Commonly known as: FLECTOR Place 1 patch onto the skin 2 (two) times daily.   docusate sodium 100 MG capsule Commonly known as: COLACE Take 100 mg by mouth daily as needed for mild constipation.   dorzolamidel-timolol 22.3-6.8 MG/ML Soln ophthalmic solution Commonly known as: COSOPT Place 1 drop into both eyes 2 (two) times daily.   fenofibrate 48 MG tablet Commonly known as:  TRICOR Take 48 mg by mouth daily.   ICAPS AREDS FORMULA PO Take 1 capsule by mouth daily.   ipratropium 0.06 % nasal spray Commonly known as: ATROVENT Place 2 sprays into both nostrils 4 (four) times daily. Started by: [name], MD   meclizine 25 MG tablet Commonly known as: Antivert Take 1 tablet (25 mg total) by mouth 2 (two) times daily as needed for dizziness.   MELATONIN PO Take 1 tablet by mouth at bedtime as needed (sleep).   multivitamin capsule Take 1 capsule by mouth daily.   omeprazole 20 MG capsule Commonly known as: PRILOSEC Take 20 mg by mouth daily.   senna 8.6 MG tablet Commonly known as: SENOKOT Take 1 tablet by mouth daily as needed for constipation.   simvastatin 10 MG tablet Commonly known as: ZOCOR Take 1 tablet (10 mg total) by mouth at bedtime.   tamsulosin 0.4 MG Caps capsule Commonly known as: FLOMAX Take 1 capsule (0.4 mg total) by mouth daily after supper.           Objective:   Physical Exam BP (!) 142/63 (BP Location: Left Arm, Patient Position: Sitting, Cuff Size: Small)   Pulse (!) 58   Temp (!) 96.9 F (36.1 C) (Temporal)   Resp 18   Ht 5\' 8"  (1.727 m)   Wt 149 lb 6 oz (67.8 kg)   SpO2 99%   BMI 22.71 kg/m  General:   Well developed, NAD, BMI noted. HEENT:  Normocephalic . Face symmetric, atraumatic Ears: + Cerumen impaction on the right otherwise normal Throat symmetric, no red. Nose is slightly congested. Lungs:  Dry crackles at both bases otherwise clear with decreased breath sounds Normal respiratory effort, no intercostal  retractions, no accessory muscle use. Heart: RRR,  no murmur.  No pretibial edema bilaterally  Abdomen: Soft, nontender GU:  DRE: + External hemorrhoids, sphincter tone is slightly decreased, prostate is a small, not tender but the left side seems to be harder to palpation. Skin: Not pale. Not jaundice Neurologic:  alert & oriented X3.  Speech normal, gait appropriate for age and unassisted Psych--  Cognition and judgment appear intact.  Cooperative with normal attention span and concentration.  Behavior appropriate. No anxious or depressed appearing.      Assessment      Assessment HTN Hyperlipidemia GERD Insomnia-- elavil prn All rhinitis CV: --CAD, MI 1989, stress test (-) 2011 ,lexiscan no ischemia  08-2016  --CHF --AAA, s/p repair 2003, [place] 2010 ok Opht: glaucoma, macular degeneration  [facility]   PLAN:  HTN: Continue with Amlodipine, atenolol.  Check a CMP High cholesterol: Checking LFTs today, on simvastatin and TriCor. L UTS: Sxs started few months ago, was Rx empirically Flomax which helped with nocturia but not urinary frequency during the day.  DRE today is abnormal with a slightly hard left side of the prostate. Plan: Continue Flomax, urology referral, UA, urine culture, PSA. Allergic rhinitis: Ongoing symptoms despite Claritin, Astelin.  Will add Atrovent nasal spray and Flonase Cerumen impaction, right ear: Lavaged today, abundant cerumen obtained.  TM looks normal, canal looks normal now. Last TSH is slightly elevated: Recheck Preventive care: Flu shot today RTC 3 months

## 2019-07-29 NOTE — Patient Instructions (Addendum)
Please schedule Medicare Wellness with Glenard Haring.   GO TO THE LAB : Get the blood work     GO TO THE FRONT DESK Schedule your next appointment   for a checkup in 3 months  We are referring you to urology Continue tamsulosin (Flomax)  For allergies: Claritin daily as needed Astelin 2 sprays on each side of the nose daily Flonase: OTC, 2 sprays on each side of the nose daily Atrovent, this is a new nasal spray: 2 sprays 3 or 4 times a day

## 2019-07-29 NOTE — Telephone Encounter (Signed)
Noted, thank you

## 2019-07-29 NOTE — Telephone Encounter (Signed)
FYI

## 2019-07-29 NOTE — Telephone Encounter (Signed)
Pt was only able to provide enough urine for the culture. There wasn't any left over for the urinalysis. I have cancelled the urinalysis and sent urine for culture.

## 2019-07-29 NOTE — Progress Notes (Signed)
Pre visit review using our clinic review tool, if applicable. No additional management support is needed unless otherwise documented below in the visit note. 

## 2019-07-30 NOTE — Assessment & Plan Note (Signed)
HTN: Continue with Amlodipine, atenolol.  Check a CMP High cholesterol: Checking LFTs today, on simvastatin and TriCor. L UTS: Sxs started few months ago, was Rx empirically Flomax which helped with nocturia but not urinary frequency during the day.  DRE today is abnormal with a slightly hard left side of the prostate. Plan: Continue Flomax, urology referral, UA, urine culture, PSA. Allergic rhinitis: Ongoing symptoms despite Claritin, Astelin.  Will add Atrovent nasal spray and Flonase Cerumen impaction, right ear: Lavaged today, abundant cerumen obtained.  TM looks normal, canal looks normal now. Last TSH is slightly elevated: Recheck Preventive care: Flu shot today RTC 3 months

## 2019-07-31 LAB — URINE CULTURE
MICRO NUMBER:: 860844
Result:: NO GROWTH
SPECIMEN QUALITY:: ADEQUATE

## 2019-08-06 ENCOUNTER — Encounter (INDEPENDENT_AMBULATORY_CARE_PROVIDER_SITE_OTHER): Payer: Medicare Other | Admitting: Ophthalmology

## 2019-08-06 ENCOUNTER — Other Ambulatory Visit: Payer: Self-pay

## 2019-08-06 DIAGNOSIS — H35033 Hypertensive retinopathy, bilateral: Secondary | ICD-10-CM

## 2019-08-06 DIAGNOSIS — H353231 Exudative age-related macular degeneration, bilateral, with active choroidal neovascularization: Secondary | ICD-10-CM

## 2019-08-06 DIAGNOSIS — I10 Essential (primary) hypertension: Secondary | ICD-10-CM | POA: Diagnosis not present

## 2019-08-06 DIAGNOSIS — H43813 Vitreous degeneration, bilateral: Secondary | ICD-10-CM

## 2019-08-07 ENCOUNTER — Telehealth: Payer: Self-pay

## 2019-08-07 MED ORDER — PREDNISONE 10 MG PO TABS: ORAL_TABLET | ORAL | 0 refills | Status: DC

## 2019-08-07 NOTE — Telephone Encounter (Signed)
Dr. Larose Kells, please advise.  Pt was just in office for a routine f/u visit w/ you on 07/29/19.  Does he need another RTO for this medication?

## 2019-08-07 NOTE — Telephone Encounter (Signed)
Son had called [facility] and stated that if no one return his phone call in an hour that he will be on the way to the office.  Ask [facility] is he on the phone to speak with and he stated that he had hung up.     Called and spoke with son, Christopher Hogan and advised that we apologize on the delay on getting back him and explained that Dr Larose Kells assistant sent message to scheduling pool for them to call and schedule an appointment as this was not talked about at previous visit and we do not usually do refills on prednisone.    Christopher Hogan stated that Christopher Hogan has been taking Aleve for the pain.  Pain is not severe and they would like prednisone sent in. Also advised that per Dr. Larose Kells that he needs to stop the Aleve because it can cause ulcers and advised to take Tylenol as well.  Also advise of Dr. Larose Kells note about his note.  Spoke with Dr. Larose Kells and he sent in prednisone.  Patient notified that medication has been sent in.

## 2019-08-07 NOTE — Telephone Encounter (Signed)
Needs appt please

## 2019-08-07 NOTE — Telephone Encounter (Signed)
Patient was recently seen, he did not mention any pain. Few months ago he had a radiculopathy that got better with prednisone.  If the pain is severe: Recommend ER or urgent care today.  If is not severe, we can call in another round of prednisone, let the son know. Also patient can  take Tylenol 500 mg 2 tablets 3 times a day. We could see you for this problem next week or refer him to Ortho

## 2019-08-07 NOTE — Telephone Encounter (Signed)
Copied from [place] [id number]. Topic: General - Other >> Aug 07, 2019  9:18 AM Carolyn Stare wrote: Pt son call to say pt saw Dr Larose Kells in April for hip pain and was given prednisone and cal lto day to ask for a refill  [facility]

## 2019-08-31 DIAGNOSIS — N138 Other obstructive and reflux uropathy: Secondary | ICD-10-CM | POA: Diagnosis not present

## 2019-08-31 DIAGNOSIS — R350 Frequency of micturition: Secondary | ICD-10-CM | POA: Diagnosis not present

## 2019-08-31 DIAGNOSIS — N401 Enlarged prostate with lower urinary tract symptoms: Secondary | ICD-10-CM | POA: Diagnosis not present

## 2019-09-08 ENCOUNTER — Other Ambulatory Visit: Payer: Self-pay

## 2019-09-08 ENCOUNTER — Ambulatory Visit (INDEPENDENT_AMBULATORY_CARE_PROVIDER_SITE_OTHER): Payer: Medicare Other | Admitting: Internal Medicine

## 2019-09-08 DIAGNOSIS — M545 Low back pain, unspecified: Secondary | ICD-10-CM

## 2019-09-08 DIAGNOSIS — R399 Unspecified symptoms and signs involving the genitourinary system: Secondary | ICD-10-CM

## 2019-09-08 NOTE — Progress Notes (Signed)
Subjective:    Patient ID: Christopher Hogan, male    DOB: Sep 21, 1925, 85 y.o.   MRN: [id number]  DOS:  09/08/2019 Type of visit - description: Attempted  to make this a video visit, due to technical difficulties from the patient side it was not possible  thus we proceeded with a Virtual Visit via Telephone    I connected with@   by telephone and verified that I am speaking with the correct person using two identifiers.  THIS ENCOUNTER IS A VIRTUAL VISIT DUE TO COVID-19 - PATIENT WAS NOT SEEN IN THE OFFICE. PATIENT HAS CONSENTED TO VIRTUAL VISIT / TELEMEDICINE VISIT   Location of patient: home  Location of provider: office  I discussed the limitations, risks, security and privacy concerns of performing an evaluation and management service by telephone and the availability of in person appointments. I also discussed with the patient that there may be a patient responsible charge related to this service. The patient expressed understanding and agreed to proceed.   History of Present Illness: Acute The patient developed again pain at the left lower back with some radiation to the left leg. Symptoms resurface 5 days ago.  Also, continue with some L UTS specifically urinary frequency during the daytime every 2 or 3 hours.  Nocturia has improved. Urology note reviewed.  Review of Systems Denies fever chills No fall or injury No back or lower extremity rash or redness.   Past Medical History:  Diagnosis Date  . AAA (abdominal aortic aneurysm) ([place])    repaired 02/2002, renal ultrasound, Sept 2010: normal abdominal aorta, normal renal arteries   . Age-related macular degeneration, wet, left eye ([place])   . Allergic rhinitis   . Arthritis   . At risk for pulmonary fibrosis 06/25/2018  . CAD (coronary artery disease)    stable stress test 7/11  . CHF (congestive heart failure) ([place])   . Chronic systolic CHF (congestive heart failure) ([place]) 12/17/2013  . Coronary atherosclerosis 06/09/2007    H/O inferior wall MI- PTCA- 1989 Last stress test 2/17- inferior scar, no ischemia    . DEGENERATIVE JOINT DISEASE 03/17/2010   Qualifier: Diagnosis of  By: Dawson Bills    . Dyslipidemia 05/06/2007   LDL 59 Jan 2019     . Eczema 07/09/2013  . ED (erectile dysfunction)   . Epidermoid cyst of skin of chest 09/29/2018  . ERECTILE DYSFUNCTION 12/09/2007   Qualifier: Diagnosis of  By: Larose Kells MD, [place] hypertension 04/30/2007   Qualifier: Diagnosis of  By: Larose Kells MD, [place] GERD 07/21/2008   Qualifier: Diagnosis of  By: Larose Kells MD, [place] GERD (gastroesophageal reflux disease)   . Glaucoma and macular degeneration 12/31/2011  . Hyperlipidemia   . Hypertension   . Inguinal hernia 12/31/2011  . Insomnia 02/22/2018  . Ischemic cardiomyopathy 06/23/2018   Last EF 30-44% by Myoview Feb 2017  . Left inguinal hernia 06/30/2018  . Myocardial infarction ([place]) 1989   interior wall infarction  . PCP notes >>>>>>>>>>>>>>>>>>>> 07/17/2016  . Pneumonia 2019  . Pre-operative cardiovascular examination 12/31/2011  . Rhinitis 04/20/2008   Qualifier: Diagnosis of  By: Larose Kells MD, [place] Vertigo 06/20/2015    Past Surgical History:  Procedure Laterality Date  . AAA repair  2003  . CARDIAC CATHETERIZATION    . CHOLECYSTECTOMY    . INGUINAL HERNIA REPAIR Right 1985  . INGUINAL HERNIA REPAIR Left 06/30/2018  open; w/mesh  . INGUINAL HERNIA REPAIR Left 06/30/2018   Procedure: OPEN LEFT INGUINAL HERNIA REPAIR;  Surgeon: Fanny Skates, MD;  Location: [facility];  Service: General;  Laterality: Left;  . INSERTION OF MESH Left 06/30/2018   Procedure: INSERTION OF MESH;  Surgeon: Fanny Skates, MD;  Location: [facility];  Service: General;  Laterality: Left;  [place] Kitchen MASS EXCISION N/A 09/29/2018   Procedure: EXCISION OF 3 CM MASS ANTERIOR CHEST WALL;  Surgeon: Fanny Skates, MD;  Location: [facility];  Service: General;  Laterality: N/A;  . PTCA  1989, 1992    Social History   Socioeconomic  History  . Marital status: Married    Spouse name: Not on file  . Number of children: 3  . Years of education: Not on file  . Highest education level: Not on file  Occupational History  . Occupation: retired     Fish farm manager: RETIRED  Social Needs  . Financial resource strain: Not on file  . Food insecurity    Worry: Not on file    Inability: Not on file  . Transportation needs    Medical: Not on file    Non-medical: Not on file  Tobacco Use  . Smoking status: Former Research scientist (life sciences)  . Smokeless tobacco: Never Used  . Tobacco comment: quit in 1953  Substance and Sexual Activity  . Alcohol use: No  . Drug use: No  . Sexual activity: Not Currently  Lifestyle  . Physical activity    Days per week: Not on file    Minutes per session: Not on file  . Stress: Not on file  Relationships  . Social Herbalist on phone: Not on file    Gets together: Not on file    Attends religious service: Not on file    Active member of club or organization: Not on file    Attends meetings of clubs or organizations: Not on file    Relationship status: Not on file  . Intimate partner violence    Fear of current or ex partner: Not on file    Emotionally abused: Not on file    Physically abused: Not on file    Forced sexual activity: Not on file  Other Topics Concern  . Not on file  Social History Narrative   WWII veteran   Independent on his ADL     Lives w/ wife      Allergies as of 09/08/2019      Reactions   Ace Inhibitors Cough   Aspirin Nausea Only, Other (See Comments)   Full strength only   Spironolactone Rash      Medication List       Accurate as of September 08, 2019 11:59 PM. If you have any questions, ask your nurse or doctor.        STOP taking these medications   predniSONE 10 MG tablet Commonly known as: DELTASONE Stopped by: [name], MD     TAKE these medications   amitriptyline 10 MG tablet Commonly known as: ELAVIL Take 1 tablet (10 mg total) by mouth at  bedtime as needed for sleep.   amLODipine 2.5 MG tablet Commonly known as: NORVASC Take 1 tablet (2.5 mg total) by mouth daily.   aspirin 81 MG chewable tablet Chew 81 mg by mouth daily.   atenolol 50 MG tablet Commonly known as: TENORMIN Take 1 tablet (50 mg total) by mouth daily.   azelastine 0.1 % nasal spray Commonly known  as: ASTELIN Place 2 sprays into both nostrils 2 (two) times daily. Use in each nostril as directed   Claritin 10 MG tablet Generic drug: loratadine Take 10 mg by mouth daily as needed for allergies.   dextromethorphan-guaiFENesin 30-600 MG 12hr tablet Commonly known as: MUCINEX DM Take 1 tablet by mouth 2 (two) times daily.   diclofenac 1.3 % Ptch Commonly known as: FLECTOR Place 1 patch onto the skin 2 (two) times daily.   docusate sodium 100 MG capsule Commonly known as: COLACE Take 100 mg by mouth daily as needed for mild constipation.   dorzolamidel-timolol 22.3-6.8 MG/ML Soln ophthalmic solution Commonly known as: COSOPT Place 1 drop into both eyes 2 (two) times daily.   fenofibrate 48 MG tablet Commonly known as: TRICOR Take 48 mg by mouth daily.   ICAPS AREDS FORMULA PO Take 1 capsule by mouth daily.   ipratropium 0.06 % nasal spray Commonly known as: ATROVENT Place 2 sprays into both nostrils 4 (four) times daily.   meclizine 25 MG tablet Commonly known as: Antivert Take 1 tablet (25 mg total) by mouth 2 (two) times daily as needed for dizziness.   MELATONIN PO Take 1 tablet by mouth at bedtime as needed (sleep).   multivitamin capsule Take 1 capsule by mouth daily.   omeprazole 20 MG capsule Commonly known as: PRILOSEC Take 20 mg by mouth daily.   senna 8.6 MG tablet Commonly known as: SENOKOT Take 1 tablet by mouth daily as needed for constipation.   simvastatin 10 MG tablet Commonly known as: ZOCOR Take 1 tablet (10 mg total) by mouth at bedtime.   tamsulosin 0.4 MG Caps capsule Commonly known as: FLOMAX Take 1  capsule (0.4 mg total) by mouth daily after supper.           Objective:   Physical Exam There were no vitals taken for this visit. This is a virtual telephone OV, he sounded alert, oriented x3, in no distress, speaking in complete sentences     Assessment      Assessment HTN Hyperlipidemia GERD Insomnia-- elavil prn All rhinitis CV: --CAD, MI 1989, stress test (-) 2011 ,lexiscan no ischemia  08-2016  --CHF --AAA, s/p repair 2003, [place] 2010 ok Opht: glaucoma, macular degeneration  [facility]   PLAN: Left low back pain with some radiation to the left leg: Had episodes of pain  in December and last month, both times responded to prednisone. We will refer him to Ortho, Dr. Erlinda Hong. No further prednisone for now, patient is managing pain with Tylenol with relatively good results L UTS: Since the last visit, started Flomax, nocturia improved, has frequent urination during the daytime.  PSA was moderately elevated.  Saw urology 08/31/2019, Dr. Felipa Eth, he rec to continue with Flomax, noted good bladder emptying, next OV  w/ him  in 3 months.      I discussed the assessment and treatment plan with the patient. The patient was provided an opportunity to ask questions and all were answered. The patient agreed with the plan and demonstrated an understanding of the instructions.   The patient was advised to call back or seek an in-person evaluation if the symptoms worsen or if the condition fails to improve as anticipated.  I provided 18 minutes of non-face-to-face time during this encounter.  [name], MD

## 2019-09-09 ENCOUNTER — Encounter (INDEPENDENT_AMBULATORY_CARE_PROVIDER_SITE_OTHER): Payer: Medicare Other | Admitting: Ophthalmology

## 2019-09-09 DIAGNOSIS — I10 Essential (primary) hypertension: Secondary | ICD-10-CM | POA: Diagnosis not present

## 2019-09-09 DIAGNOSIS — H353231 Exudative age-related macular degeneration, bilateral, with active choroidal neovascularization: Secondary | ICD-10-CM

## 2019-09-09 DIAGNOSIS — H35033 Hypertensive retinopathy, bilateral: Secondary | ICD-10-CM | POA: Diagnosis not present

## 2019-09-09 NOTE — Assessment & Plan Note (Signed)
Left low back pain with some radiation to the left leg: Had episodes of pain  in December and last month, both times responded to prednisone. We will refer him to Ortho, Dr. Erlinda Hong. No further prednisone for now, patient is managing pain with Tylenol with relatively good results L UTS: Since the last visit, started Flomax, nocturia improved, has frequent urination during the daytime.  PSA was moderately elevated.  Saw urology 08/31/2019, Dr. Felipa Eth, he rec to continue with Flomax, noted good bladder emptying, next OV  w/ him  in 3 months.

## 2019-09-18 ENCOUNTER — Ambulatory Visit: Payer: Self-pay

## 2019-09-18 ENCOUNTER — Ambulatory Visit: Payer: Medicare Other | Admitting: Orthopaedic Surgery

## 2019-09-18 ENCOUNTER — Encounter: Payer: Self-pay | Admitting: Orthopaedic Surgery

## 2019-09-18 ENCOUNTER — Other Ambulatory Visit: Payer: Self-pay

## 2019-09-18 DIAGNOSIS — M545 Low back pain, unspecified: Secondary | ICD-10-CM

## 2019-09-18 MED ORDER — MELOXICAM 7.5 MG PO TABS: 7.5000 mg | ORAL_TABLET | Freq: Every day | ORAL | 2 refills | Status: DC | PRN

## 2019-09-18 NOTE — Progress Notes (Signed)
Office Visit Note   Patient: Christopher Hogan           Date of Birth: 1925-07-04           MRN: [id number] Visit Date: 09/18/2019              Requested by: Colon Branch, [address] [place],  [place] [address] PCP: Colon Branch, MD   Assessment & Plan: Visit Diagnoses:  1. Acute left-sided low back pain, unspecified whether sciatica present     Plan: Impression is left sided sciatica.  I will start the patient on a low-dose anti-inflammatory.  He does note that he takes a baby aspirin daily so I have recommended an over-the-counter gastroprotective medicine to protect against gastric ulcer.  We will also send him to formal physical therapy.  A prescription was provided to the patient for this.  I am also put a referral into the computer.  He will follow-up with [place] as needed.  Follow-Up Instructions: Return if symptoms worsen or fail to improve.   Orders:  Orders Placed This Encounter  Procedures  . XR Lumbar Spine 2-3 Views  . Ambulatory referral to Physical Therapy   Meds ordered this encounter  Medications  . meloxicam (MOBIC) 7.5 MG tablet    Sig: Take 1 tablet (7.5 mg total) by mouth daily as needed for up to 14 doses for pain.    Dispense:  30 tablet    Refill:  2      Procedures: No procedures performed   Clinical Data: No additional findings.   Subjective: Chief Complaint  Patient presents with  . Lower Back - Pain    HPI patient is a pleasant 85 year old gentleman who presents to our clinic today with left lower back pain which radiates down his buttocks and down the posterior lateral aspect of his leg and into his foot.  This has been ongoing for the past 2 months.  No known injury.  Pain is aggravated with standing as well as with ambulation.  He has been on 2 rounds of prednisone with the last one significantly helping.  He did just finish this 4 days ago.  He denies any numbness, tingling or burning.  No bowel or bladder change and no  saddle paresthesias.  He has not yet been to physical therapy and he is not had an ESI.  Review of Systems as detailed in HPI.  All others reviewed and are negative.   Objective: Vital Signs: There were no vitals taken for this visit.  Physical Exam well-developed well-nourished gentleman in no acute distress.  Alert and oriented x3.  Ortho Exam examination of his lower back reveals no spinous or paraspinous tenderness.  He does have mild tenderness to the sciatic notch on the left.  He has a positive straight leg raise.  No focal weakness.  He is neurovascularly intact distally.  Specialty Comments:  No specialty comments available.  Imaging: Xr Lumbar Spine 2-3 Views  Result Date: 09/18/2019 X-rays demonstrate diffuse spondylosis worse at L4-5 and L5-S1    PMFS History: Patient Active Problem List   Diagnosis Date Noted  . Epidermoid cyst of skin of chest 09/29/2018  . Left inguinal hernia 06/30/2018  . At risk for pulmonary fibrosis 06/25/2018  . Ischemic cardiomyopathy 06/23/2018  . Insomnia 02/22/2018  . PCP notes >>>>>>>>>>>>>>>>>>>> 07/17/2016  . Vertigo 06/20/2015  . Chronic systolic CHF (congestive heart failure) ([place]) 12/17/2013  . Eczema 07/09/2013  .  Annual physical exam 12/31/2011  . Inguinal hernia 12/31/2011  . Glaucoma and macular degeneration 12/31/2011  . DEGENERATIVE JOINT DISEASE 03/17/2010  . GERD 07/21/2008  . Rhinitis 04/20/2008  . ERECTILE DYSFUNCTION 12/09/2007  . Coronary atherosclerosis 06/09/2007  . Dyslipidemia 05/06/2007  . Essential hypertension 04/30/2007   Past Medical History:  Diagnosis Date  . AAA (abdominal aortic aneurysm) ([place])    repaired 02/2002, renal ultrasound, Sept 2010: normal abdominal aorta, normal renal arteries   . Age-related macular degeneration, wet, left eye ([place])   . Allergic rhinitis   . Arthritis   . At risk for pulmonary fibrosis 06/25/2018  . CAD (coronary artery disease)    stable stress test 7/11  . CHF  (congestive heart failure) ([place])   . Chronic systolic CHF (congestive heart failure) ([place]) 12/17/2013  . Coronary atherosclerosis 06/09/2007   H/O inferior wall MI- PTCA- 1989 Last stress test 2/17- inferior scar, no ischemia    . DEGENERATIVE JOINT DISEASE 03/17/2010   Qualifier: Diagnosis of  By: Dawson Bills    . Dyslipidemia 05/06/2007   LDL 59 Jan 2019     . Eczema 07/09/2013  . ED (erectile dysfunction)   . Epidermoid cyst of skin of chest 09/29/2018  . ERECTILE DYSFUNCTION 12/09/2007   Qualifier: Diagnosis of  By: Larose Kells MD, [place] hypertension 04/30/2007   Qualifier: Diagnosis of  By: Larose Kells MD, [place] GERD 07/21/2008   Qualifier: Diagnosis of  By: Larose Kells MD, [place] GERD (gastroesophageal reflux disease)   . Glaucoma and macular degeneration 12/31/2011  . Hyperlipidemia   . Hypertension   . Inguinal hernia 12/31/2011  . Insomnia 02/22/2018  . Ischemic cardiomyopathy 06/23/2018   Last EF 30-44% by Myoview Feb 2017  . Left inguinal hernia 06/30/2018  . Myocardial infarction ([place]) 1989   interior wall infarction  . PCP notes >>>>>>>>>>>>>>>>>>>> 07/17/2016  . Pneumonia 2019  . Pre-operative cardiovascular examination 12/31/2011  . Rhinitis 04/20/2008   Qualifier: Diagnosis of  By: Larose Kells MD, [place] Vertigo 06/20/2015    Family History  Problem Relation Age of Onset  . Lung cancer Father   . CAD Other        mother?  . Kidney Stones Son   . Prostate cancer Neg Hx   . Diabetes Neg Hx   . Colon cancer Neg Hx     Past Surgical History:  Procedure Laterality Date  . AAA repair  2003  . CARDIAC CATHETERIZATION    . CHOLECYSTECTOMY    . INGUINAL HERNIA REPAIR Right 1985  . INGUINAL HERNIA REPAIR Left 06/30/2018   open; w/mesh  . INGUINAL HERNIA REPAIR Left 06/30/2018   Procedure: OPEN LEFT INGUINAL HERNIA REPAIR;  Surgeon: Fanny Skates, MD;  Location: [facility];  Service: General;  Laterality: Left;  . INSERTION OF MESH Left 06/30/2018   Procedure: INSERTION OF MESH;   Surgeon: Fanny Skates, MD;  Location: [facility];  Service: General;  Laterality: Left;  Marland Kitchen MASS EXCISION N/A 09/29/2018   Procedure: EXCISION OF 3 CM MASS ANTERIOR CHEST WALL;  Surgeon: Fanny Skates, MD;  Location: [facility];  Service: General;  Laterality: N/A;  . [place] History   Occupational History  . Occupation: retired     Fish farm manager: RETIRED  Tobacco Use  . Smoking status: Former Research scientist (life sciences)  . Smokeless tobacco: Never Used  . Tobacco comment: quit in 1953  Substance and Sexual Activity  . Alcohol use: No  . Drug use: No  . Sexual activity: Not Currently

## 2019-09-20 ENCOUNTER — Other Ambulatory Visit: Payer: Self-pay | Admitting: Internal Medicine

## 2019-10-07 NOTE — Progress Notes (Signed)
Virtual Visit via Video Note  I connected with patient on 10/08/19 at  1:00 PM EST by audio enabled telemedicine application and verified that I am speaking with the correct person using two identifiers.   THIS ENCOUNTER IS A VIRTUAL VISIT DUE TO COVID-19 - PATIENT WAS NOT SEEN IN THE OFFICE. PATIENT HAS CONSENTED TO VIRTUAL VISIT / TELEMEDICINE VISIT   Location of patient: home  Location of provider: office  I discussed the limitations of evaluation and management by telemedicine and the availability of in person appointments. The patient expressed understanding and agreed to proceed.   Subjective:   Christopher Hogan is a 85 y.o. male who presents for Medicare Annual/Subsequent preventive examination.  Review of Systems:  Home Safety/Smoke Alarms: Feels safe in home. Smoke alarms in place.  Lives w/ wife. 1st floor condo.   Male:   PSA-  Lab Results  Component Value Date   PSA 5.77 (H) 07/29/2019   PSA 1.21 12/31/2011   PSA 1.16 03/17/2010       Objective:    Vitals: Unable to assess. This visit is enabled though telemedicine due to Covid 19.   Advanced Directives 10/08/2019 09/29/2018 09/24/2018 06/30/2018 01/22/2017  Does Patient Have a Medical Advance Directive? Yes Yes Yes Yes Yes  Type of Paramedic of [place];Living will [place];Living will Living will;Healthcare Power of Attorney Living will [place];Living will  Does patient want to make changes to medical advance directive? No - Patient declined No - Patient declined No - Patient declined No - Patient declined -  Copy of [place] in Chart? No - copy requested No - copy requested - - No - copy requested    Tobacco Social History   Tobacco Use  Smoking Status Former Smoker  Smokeless Tobacco Never Used  Tobacco Comment   quit in 1953     Counseling given: Not Answered Comment: quit in 1953   Clinical Intake: Pain :  No/denies pain   Past Medical History:  Diagnosis Date  . AAA (abdominal aortic aneurysm) ([place])    repaired 02/2002, renal ultrasound, Sept 2010: normal abdominal aorta, normal renal arteries   . Age-related macular degeneration, wet, left eye ([place])   . Allergic rhinitis   . Arthritis   . At risk for pulmonary fibrosis 06/25/2018  . CAD (coronary artery disease)    stable stress test 7/11  . CHF (congestive heart failure) ([place])   . Chronic systolic CHF (congestive heart failure) ([place]) 12/17/2013  . Coronary atherosclerosis 06/09/2007   H/O inferior wall MI- PTCA- 1989 Last stress test 2/17- inferior scar, no ischemia    . DEGENERATIVE JOINT DISEASE 03/17/2010   Qualifier: Diagnosis of  By: Dawson Bills    . Dyslipidemia 05/06/2007   LDL 59 Jan 2019     . Eczema 07/09/2013  . ED (erectile dysfunction)   . Epidermoid cyst of skin of chest 09/29/2018  . ERECTILE DYSFUNCTION 12/09/2007   Qualifier: Diagnosis of  By: Larose Kells MD, [place] hypertension 04/30/2007   Qualifier: Diagnosis of  By: Larose Kells MD, [place] GERD 07/21/2008   Qualifier: Diagnosis of  By: Larose Kells MD, [place] GERD (gastroesophageal reflux disease)   . Glaucoma and macular degeneration 12/31/2011  . Hyperlipidemia   . Hypertension   . Inguinal hernia 12/31/2011  . Insomnia 02/22/2018  . Ischemic cardiomyopathy 06/23/2018   Last EF 30-44% by Myoview  Feb 2017  . Left inguinal hernia 06/30/2018  . Myocardial infarction ([place]) 1989   interior wall infarction  . PCP notes >>>>>>>>>>>>>>>>>>>> 07/17/2016  . Pneumonia 2019  . Pre-operative cardiovascular examination 12/31/2011  . Rhinitis 04/20/2008   Qualifier: Diagnosis of  By: Larose Kells MD, [place] Vertigo 06/20/2015   Past Surgical History:  Procedure Laterality Date  . AAA repair  2003  . CARDIAC CATHETERIZATION    . CHOLECYSTECTOMY    . INGUINAL HERNIA REPAIR Right 1985  . INGUINAL HERNIA REPAIR Left 06/30/2018   open; w/mesh  . INGUINAL HERNIA REPAIR Left 06/30/2018    Procedure: OPEN LEFT INGUINAL HERNIA REPAIR;  Surgeon: Fanny Skates, MD;  Location: [facility];  Service: General;  Laterality: Left;  . INSERTION OF MESH Left 06/30/2018   Procedure: INSERTION OF MESH;  Surgeon: Fanny Skates, MD;  Location: [facility];  Service: General;  Laterality: Left;  Marland Kitchen MASS EXCISION N/A 09/29/2018   Procedure: EXCISION OF 3 CM MASS ANTERIOR CHEST WALL;  Surgeon: Fanny Skates, MD;  Location: [facility];  Service: General;  Laterality: N/A;  . PTCA  1989, 1992   Family History  Problem Relation Age of Onset  . Lung cancer Father   . CAD Other        mother?  . Kidney Stones Son   . Prostate cancer Neg Hx   . Diabetes Neg Hx   . Colon cancer Neg Hx    Social History   Socioeconomic History  . Marital status: Married    Spouse name: Not on file  . Number of children: 3  . Years of education: Not on file  . Highest education level: Not on file  Occupational History  . Occupation: retired     Fish farm manager: RETIRED  Social Needs  . Financial resource strain: Not on file  . Food insecurity    Worry: Not on file    Inability: Not on file  . Transportation needs    Medical: Not on file    Non-medical: Not on file  Tobacco Use  . Smoking status: Former Research scientist (life sciences)  . Smokeless tobacco: Never Used  . Tobacco comment: quit in 1953  Substance and Sexual Activity  . Alcohol use: No  . Drug use: No  . Sexual activity: Not Currently  Lifestyle  . Physical activity    Days per week: Not on file    Minutes per session: Not on file  . Stress: Not on file  Relationships  . Social Herbalist on phone: Not on file    Gets together: Not on file    Attends religious service: Not on file    Active member of club or organization: Not on file    Attends meetings of clubs or organizations: Not on file    Relationship status: Not on file  Other Topics Concern  . Not on file  Social History Narrative   WWII veteran   Independent on his ADL      Lives w/ wife    Outpatient Encounter Medications as of 10/08/2019  Medication Sig  . aspirin 81 MG chewable tablet Chew 81 mg by mouth daily.   Marland Kitchen atenolol (TENORMIN) 50 MG tablet Take 1 tablet (50 mg total) by mouth daily.  Marland Kitchen azelastine (ASTELIN) 0.1 % nasal spray Place 2 sprays into both nostrils 2 (two) times daily. Use in each nostril as directed  . dextromethorphan-guaiFENesin (MUCINEX DM) 30-600 MG 12hr tablet Take 1  tablet by mouth 2 (two) times daily.  . diclofenac (FLECTOR) 1.3 % PTCH Place 1 patch onto the skin 2 (two) times daily.  Marland Kitchen docusate sodium (COLACE) 100 MG capsule Take 100 mg by mouth daily as needed for mild constipation.  . Dorzolamide HCl-Timolol Mal PF 22.3-6.8 MG/ML SOLN Place 1 drop into both eyes 2 (two) times daily.   . fenofibrate (TRICOR) 48 MG tablet Take 48 mg by mouth daily.  Marland Kitchen ipratropium (ATROVENT) 0.06 % nasal spray Place 2 sprays into both nostrils 4 (four) times daily.  Marland Kitchen loratadine (CLARITIN) 10 MG tablet Take 10 mg by mouth daily as needed for allergies.   Marland Kitchen meclizine (ANTIVERT) 25 MG tablet Take 1 tablet (25 mg total) by mouth 2 (two) times daily as needed for dizziness.  Marland Kitchen MELATONIN PO Take 1 tablet by mouth at bedtime as needed (sleep).   . meloxicam (MOBIC) 7.5 MG tablet Take 1 tablet (7.5 mg total) by mouth daily as needed for up to 14 doses for pain.  . Multiple Vitamin (MULTIVITAMIN) capsule Take 1 capsule by mouth daily.    . Multiple Vitamins-Minerals (ICAPS AREDS FORMULA PO) Take 1 capsule by mouth daily.  Marland Kitchen omeprazole (PRILOSEC) 20 MG capsule Take 20 mg by mouth daily.    Marland Kitchen senna (SENOKOT) 8.6 MG tablet Take 1 tablet by mouth daily as needed for constipation.  . simvastatin (ZOCOR) 10 MG tablet Take 1 tablet (10 mg total) by mouth at bedtime.  . tamsulosin (FLOMAX) 0.4 MG CAPS capsule Take 1 capsule (0.4 mg total) by mouth daily after supper.  Marland Kitchen amitriptyline (ELAVIL) 10 MG tablet Take 1 tablet (10 mg total) by mouth at bedtime as needed for  sleep. (Patient not taking: Reported on 10/08/2019)  . amLODipine (NORVASC) 2.5 MG tablet Take 1 tablet (2.5 mg total) by mouth daily.   No facility-administered encounter medications on file as of 10/08/2019.     Activities of Daily Living In your present state of health, do you have any difficulty performing the following activities: 10/08/2019 07/29/2019  Hearing? Tempie Donning  Comment has hearing aids. Not wearing them. -  Vision? N Y  Comment See Dr.Matthews for eye injection monthly. -  Difficulty concentrating or making decisions? N N  Walking or climbing stairs? N Y  Dressing or bathing? N N  Doing errands, shopping? Tempie Donning  Preparing Food and eating ? N -  Using the Toilet? N -  In the past six months, have you accidently leaked urine? N -  Do you have problems with loss of bowel control? N -  Managing your Medications? N -  Managing your Finances? Y -  Housekeeping or managing your Housekeeping? Y -  Some recent data might be hidden    Patient Care Team: Colon Branch, MD as PCP - General Dorothy Spark, MD as PCP - Cardiology (Cardiology) Hayden Pedro, MD as Consulting Physician (Ophthalmology) Melissa Montane, MD as Consulting Physician (Otolaryngology) Katy Fitch, Darlina Guys, MD as Consulting Physician (Ophthalmology) Fanny Skates, MD as Consulting Physician (General Surgery) [place], Reece Leader., MD as Referring Physician (Urology)   Assessment:   This is a routine wellness examination for [place]. Physical assessment deferred to PCP.  Exercise Activities and Dietary recommendations Current Exercise Habits: The patient does not participate in regular exercise at present, Exercise limited by: None identified Diet (meal preparation, eat out, water intake, caffeinated beverages, dairy products, fruits and vegetables): well balanced   Goals    . Increase physical activity    .  Increase water intake       Fall Risk Fall Risk  10/08/2019 07/29/2019 02/21/2018 01/22/2017  07/17/2016  Falls in the past year? 1 1 No Yes No  Number falls in past yr: 0 0 - 1 -  Injury with Fall? 1 0 - Yes -  Risk for fall due to : Impaired balance/gait - - - -  Follow up Education provided;Falls prevention discussed Falls evaluation completed - - -    Depression Screen PHQ 2/9 Scores 10/08/2019 07/29/2019 02/21/2018 01/22/2017  PHQ - 2 Score 0 0 0 0    Cognitive Function  MMSE - Mini Mental State Exam 10/08/2019 01/22/2017  Not completed: Refused -  Orientation to time - 5  Orientation to Place - 5  Registration - 3  Attention/ Calculation - 3  Recall - 2  Language- name 2 objects - 2  Language- repeat - 1  Language- follow 3 step command - 3  Language- read & follow direction - 1  Write a sentence - 1  Copy design - 0  Total score - 26        Immunization History  Administered Date(s) Administered  . Fluad Quad(high Dose 65+) 07/29/2019  . Influenza Split 09/19/2011  . Influenza Whole 08/19/2000, 09/16/2008, 11/02/2009, 07/21/2010  . Influenza, High Dose Seasonal PF 07/25/2013, 10/21/2015, 07/17/2016, 10/28/2018  . Influenza,inj,Quad PF,6+ Mos 01/07/2015  . Pneumococcal Conjugate-13 10/21/2015  . Pneumococcal Polysaccharide-23 08/19/2006, 01/06/2013  . Td 10/26/1999  . Tdap 12/31/2011  . Zoster 11/19/2014   Screening Tests Health Maintenance  Topic Date Due  . TETANUS/TDAP  12/30/2021  . INFLUENZA VACCINE  Completed  . PNA vac Low Risk Adult  Completed      Plan:    Please schedule your next medicare wellness visit with me in 1 yr.  Continue to eat heart healthy diet (full of fruits, vegetables, whole grains, lean protein, water--limit salt, fat, and sugar intake) and increase physical activity as tolerated.  Continue doing brain stimulating activities (puzzles, reading, adult coloring books, staying active) to keep memory sharp.   Bring a copy of your living will and/or healthcare power of attorney to your next office visit.    I have personally  reviewed and noted the following in the patient's chart:   . Medical and social history . Use of alcohol, tobacco or illicit drugs  . Current medications and supplements . Functional ability and status . Nutritional status . Physical activity . Advanced directives . List of other physicians . Hospitalizations, surgeries, and ER visits in previous 12 months . Vitals . Screenings to include cognitive, depression, and falls . Referrals and appointments  In addition, I have reviewed and discussed with patient certain preventive protocols, quality metrics, and best practice recommendations. A written personalized care plan for preventive services as well as general preventive health recommendations were provided to patient.     Shela Nevin, [place]  10/08/2019

## 2019-10-08 ENCOUNTER — Ambulatory Visit (INDEPENDENT_AMBULATORY_CARE_PROVIDER_SITE_OTHER): Payer: Medicare Other | Admitting: *Deleted

## 2019-10-08 ENCOUNTER — Other Ambulatory Visit: Payer: Self-pay

## 2019-10-08 ENCOUNTER — Encounter: Payer: Self-pay | Admitting: *Deleted

## 2019-10-08 DIAGNOSIS — Z0000 Encounter for general adult medical examination without abnormal findings: Secondary | ICD-10-CM | POA: Diagnosis not present

## 2019-10-08 NOTE — Patient Instructions (Signed)
Please schedule your next medicare wellness visit with me in 1 yr.  Continue to eat heart healthy diet (full of fruits, vegetables, whole grains, lean protein, water--limit salt, fat, and sugar intake) and increase physical activity as tolerated.  Continue doing brain stimulating activities (puzzles, reading, adult coloring books, staying active) to keep memory sharp.   Bring a copy of your living will and/or healthcare power of attorney to your next office visit.   Christopher Hogan , Thank you for taking time to come for your Medicare Wellness Visit. I appreciate your ongoing commitment to your health goals. Please review the following plan we discussed and let me know if I can assist you in the future.   These are the goals we discussed: Goals    . Increase physical activity    . Increase water intake       This is a list of the screening recommended for you and due dates:  Health Maintenance  Topic Date Due  . Tetanus Vaccine  12/30/2021  . Flu Shot  Completed  . Pneumonia vaccines  Completed    Preventive Care 42 Years and Older, Male Preventive care refers to lifestyle choices and visits with your health care provider that can promote health and wellness. This includes:  A yearly physical exam. This is also called an annual well check.  Regular dental and eye exams.  Immunizations.  Screening for certain conditions.  Healthy lifestyle choices, such as diet and exercise. What can I expect for my preventive care visit? Physical exam Your health care provider will check:  Height and weight. These may be used to calculate body mass index (BMI), which is a measurement that tells if you are at a healthy weight.  Heart rate and blood pressure.  Your skin for abnormal spots. Counseling Your health care provider may ask you questions about:  Alcohol, tobacco, and drug use.  Emotional well-being.  Home and relationship well-being.  Sexual activity.  Eating habits.   History of falls.  Memory and ability to understand (cognition).  Work and work Statistician. What immunizations do I need?  Influenza (flu) vaccine  This is recommended every year. Tetanus, diphtheria, and pertussis (Tdap) vaccine  You may need a Td booster every 10 years. Varicella (chickenpox) vaccine  You may need this vaccine if you have not already been vaccinated. Zoster (shingles) vaccine  You may need this after age 9. Pneumococcal conjugate (PCV13) vaccine  One dose is recommended after age 66. Pneumococcal polysaccharide (PPSV23) vaccine  One dose is recommended after age 44. Measles, mumps, and rubella (MMR) vaccine  You may need at least one dose of MMR if you were born in 1957 or later. You may also need a second dose. Meningococcal conjugate (MenACWY) vaccine  You may need this if you have certain conditions. Hepatitis A vaccine  You may need this if you have certain conditions or if you travel or work in places where you may be exposed to hepatitis A. Hepatitis B vaccine  You may need this if you have certain conditions or if you travel or work in places where you may be exposed to hepatitis B. Haemophilus influenzae type b (Hib) vaccine  You may need this if you have certain conditions. You may receive vaccines as individual doses or as more than one vaccine together in one shot (combination vaccines). Talk with your health care provider about the risks and benefits of combination vaccines. What tests do I need? Blood tests  Lipid and  cholesterol levels. These may be checked every 5 years, or more frequently depending on your overall health.  Hepatitis C test.  Hepatitis B test. Screening  Lung cancer screening. You may have this screening every year starting at age 56 if you have a 30-pack-year history of smoking and currently smoke or have quit within the past 15 years.  Colorectal cancer screening. All adults should have this screening starting  at age 38 and continuing until age 43. Your health care provider may recommend screening at age 52 if you are at increased risk. You will have tests every 1-10 years, depending on your results and the type of screening test.  Prostate cancer screening. Recommendations will vary depending on your family history and other risks.  Diabetes screening. This is done by checking your blood sugar (glucose) after you have not eaten for a while (fasting). You may have this done every 1-3 years.  Abdominal aortic aneurysm (AAA) screening. You may need this if you are a current or former smoker.  Sexually transmitted disease (STD) testing. Follow these instructions at home: Eating and drinking  Eat a diet that includes fresh fruits and vegetables, whole grains, lean protein, and low-fat dairy products. Limit your intake of foods with high amounts of sugar, saturated fats, and salt.  Take vitamin and mineral supplements as recommended by your health care provider.  Do not drink alcohol if your health care provider tells you not to drink.  If you drink alcohol: ? Limit how much you have to 0-2 drinks a day. ? Be aware of how much alcohol is in your drink. In the [place] one drink equals one 12 oz bottle of beer (355 mL), one 5 oz glass of wine (148 mL), or one 1 oz glass of hard liquor (44 mL). Lifestyle  Take daily care of your teeth and gums.  Stay active. Exercise for at least 30 minutes on 5 or more days each week.  Do not use any products that contain nicotine or tobacco, such as cigarettes, e-cigarettes, and chewing tobacco. If you need help quitting, ask your health care provider.  If you are sexually active, practice safe sex. Use a condom or other form of protection to prevent STIs (sexually transmitted infections).  Talk with your health care provider about taking a low-dose aspirin or statin. What's next?  Visit your health care provider once a year for a well check visit.  Ask your  health care provider how often you should have your eyes and teeth checked.  Stay up to date on all vaccines. This information is not intended to replace advice given to you by your health care provider. Make sure you discuss any questions you have with your health care provider. Document Released: 12/02/2015 Document Revised: 10/30/2018 Document Reviewed: 10/30/2018 Elsevier Patient Education  2020 [employer].

## 2019-10-09 ENCOUNTER — Other Ambulatory Visit: Payer: Self-pay

## 2019-10-09 MED ORDER — TAMSULOSIN HCL 0.4 MG PO CAPS: 0.4000 mg | ORAL_CAPSULE | Freq: Every day | ORAL | 3 refills | Status: DC

## 2019-10-12 ENCOUNTER — Encounter (INDEPENDENT_AMBULATORY_CARE_PROVIDER_SITE_OTHER): Payer: Medicare Other | Admitting: Ophthalmology

## 2019-10-12 DIAGNOSIS — H353231 Exudative age-related macular degeneration, bilateral, with active choroidal neovascularization: Secondary | ICD-10-CM

## 2019-10-12 DIAGNOSIS — I10 Essential (primary) hypertension: Secondary | ICD-10-CM

## 2019-10-12 DIAGNOSIS — H43813 Vitreous degeneration, bilateral: Secondary | ICD-10-CM | POA: Diagnosis not present

## 2019-10-12 DIAGNOSIS — H35033 Hypertensive retinopathy, bilateral: Secondary | ICD-10-CM

## 2019-10-21 DIAGNOSIS — H401113 Primary open-angle glaucoma, right eye, severe stage: Secondary | ICD-10-CM | POA: Diagnosis not present

## 2019-10-21 DIAGNOSIS — H401121 Primary open-angle glaucoma, left eye, mild stage: Secondary | ICD-10-CM | POA: Diagnosis not present

## 2019-10-27 ENCOUNTER — Other Ambulatory Visit: Payer: Self-pay

## 2019-10-28 ENCOUNTER — Other Ambulatory Visit: Payer: Self-pay

## 2019-10-28 ENCOUNTER — Encounter: Payer: Self-pay | Admitting: Internal Medicine

## 2019-10-28 ENCOUNTER — Ambulatory Visit (INDEPENDENT_AMBULATORY_CARE_PROVIDER_SITE_OTHER): Payer: Medicare Other | Admitting: Internal Medicine

## 2019-10-28 VITALS — BP 140/69 | HR 64 | Temp 96.5°F | Resp 18 | Ht 68.0 in | Wt 152.0 lb

## 2019-10-28 DIAGNOSIS — I10 Essential (primary) hypertension: Secondary | ICD-10-CM | POA: Diagnosis not present

## 2019-10-28 DIAGNOSIS — E785 Hyperlipidemia, unspecified: Secondary | ICD-10-CM | POA: Diagnosis not present

## 2019-10-28 DIAGNOSIS — J8410 Pulmonary fibrosis, unspecified: Secondary | ICD-10-CM

## 2019-10-28 LAB — LIPID PANEL
Cholesterol: 109 mg/dL (ref 0–200)
HDL: 38.8 mg/dL — ABNORMAL LOW (ref >39.00)
LDL Cholesterol: 39 mg/dL (ref 0–99)
NonHDL: 69.77
Total CHOL/HDL Ratio: 3
Triglycerides: 152 mg/dL — ABNORMAL HIGH (ref 0.0–149.0)
VLDL: 30.4 mg/dL (ref 0.0–40.0)

## 2019-10-28 LAB — BASIC METABOLIC PANEL
BUN: 16 mg/dL (ref 6–23)
CO2: 31 mEq/L (ref 19–32)
Calcium: 8.6 mg/dL (ref 8.4–10.5)
Chloride: 102 mEq/L (ref 96–112)
Creatinine, Ser: 1.05 mg/dL (ref 0.40–1.50)
GFR: 66.05 mL/min (ref >60.00)
Glucose, Bld: 88 mg/dL (ref 70–99)
Potassium: 4.2 mEq/L (ref 3.5–5.1)
Sodium: 136 mEq/L (ref 135–145)

## 2019-10-28 NOTE — Progress Notes (Signed)
Pre visit review using our clinic review tool, if applicable. No additional management support is needed unless otherwise documented below in the visit note. 

## 2019-10-28 NOTE — Patient Instructions (Signed)
GO TO THE LAB : Get the blood work     GO TO THE FRONT DESK Schedule your next appointment   For a check up in 6 months    Check the  blood pressure 2 or 3 times a month  BP GOAL is between 110/65 and  135/85. If it is consistently higher or lower, let me know

## 2019-10-28 NOTE — Progress Notes (Signed)
Subjective:    Patient ID: Christopher Hogan, male    DOB: 1925/02/23, 85 y.o.   MRN: [id number]  DOS:  10/28/2019 Type of visit - description: Routine visit No major concerns Had back pain with left leg radiation, that has improved.  He still takes occasional meloxicam LUTS: Improved Insomnia, not needed Elavil lately.  Review of Systems  Denies chest pain, no shortness of breath or ADLs No lower extremity edema No cough Past Medical History:  Diagnosis Date  . AAA (abdominal aortic aneurysm) ([place])    repaired 02/2002, renal ultrasound, Sept 2010: normal abdominal aorta, normal renal arteries   . Age-related macular degeneration, wet, left eye ([place])   . Allergic rhinitis   . Arthritis   . At risk for pulmonary fibrosis 06/25/2018  . CAD (coronary artery disease)    stable stress test 7/11  . CHF (congestive heart failure) ([place])   . Chronic systolic CHF (congestive heart failure) ([place]) 12/17/2013  . Coronary atherosclerosis 06/09/2007   H/O inferior wall MI- PTCA- 1989 Last stress test 2/17- inferior scar, no ischemia    . DEGENERATIVE JOINT DISEASE 03/17/2010   Qualifier: Diagnosis of  By: Dawson Bills    . Dyslipidemia 05/06/2007   LDL 59 Jan 2019     . Eczema 07/09/2013  . ED (erectile dysfunction)   . Epidermoid cyst of skin of chest 09/29/2018  . ERECTILE DYSFUNCTION 12/09/2007   Qualifier: Diagnosis of  By: Larose Kells MD, [place] hypertension 04/30/2007   Qualifier: Diagnosis of  By: Larose Kells MD, [place] GERD 07/21/2008   Qualifier: Diagnosis of  By: Larose Kells MD, [place] GERD (gastroesophageal reflux disease)   . Glaucoma and macular degeneration 12/31/2011  . Hyperlipidemia   . Hypertension   . Inguinal hernia 12/31/2011  . Insomnia 02/22/2018  . Ischemic cardiomyopathy 06/23/2018   Last EF 30-44% by My[facility] Feb 2017  . Left inguinal hernia 06/30/2018  . Myocardial infarction ([place]) 1989   interior wall infarction  . PCP notes >>>>>>>>>>>>>>>>>>>> 07/17/2016  .  Pneumonia 2019  . Pre-operative cardiovascular examination 12/31/2011  . Rhinitis 04/20/2008   Qualifier: Diagnosis of  By: Larose Kells MD, [place] Vertigo 06/20/2015    Past Surgical History:  Procedure Laterality Date  . AAA repair  2003  . CARDIAC CATHETERIZATION    . CHOLECYSTECTOMY    . INGUINAL HERNIA REPAIR Right 1985  . INGUINAL HERNIA REPAIR Left 06/30/2018   open; w/mesh  . INGUINAL HERNIA REPAIR Left 06/30/2018   Procedure: OPEN LEFT INGUINAL HERNIA REPAIR;  Surgeon: Fanny Skates, MD;  Location: [facility];  Service: General;  Laterality: Left;  . INSERTION OF MESH Left 06/30/2018   Procedure: INSERTION OF MESH;  Surgeon: Fanny Skates, MD;  Location: [facility];  Service: General;  Laterality: Left;  Marland Kitchen MASS EXCISION N/A 09/29/2018   Procedure: EXCISION OF 3 CM MASS ANTERIOR CHEST WALL;  Surgeon: Fanny Skates, MD;  Location: [facility];  Service: General;  Laterality: N/A;  . PTCA  1989, 1992    Social History   Socioeconomic History  . Marital status: Married    Spouse name: Not on file  . Number of children: 3  . Years of education: Not on file  . Highest education level: Not on file  Occupational History  . Occupation: retired     Fish farm manager: RETIRED  Tobacco Use  . Smoking status: Former Research scientist (life sciences)  . Smokeless  tobacco: Never Used  . Tobacco comment: quit in 1953  Substance and Sexual Activity  . Alcohol use: No  . Drug use: No  . Sexual activity: Not Currently  Other Topics Concern  . Not on file  Social History Narrative   WWII veteran   Independent on his ADL     Lives w/ wife   Social Determinants of Health   Financial Resource Strain:   . Difficulty of Paying Living Expenses: Not on file  Food Insecurity:   . Worried About Charity fundraiser in the Last Year: Not on file  . Ran Out of Food in the Last Year: Not on file  Transportation Needs:   . Lack of Transportation (Medical): Not on file  . Lack of Transportation (Non-Medical): Not on  file  Physical Activity:   . Days of Exercise per Week: Not on file  . Minutes of Exercise per Session: Not on file  Stress:   . Feeling of Stress : Not on file  Social Connections:   . Frequency of Communication with Friends and Family: Not on file  . Frequency of Social Gatherings with Friends and Family: Not on file  . Attends Religious Services: Not on file  . Active Member of Clubs or Organizations: Not on file  . Attends Archivist Meetings: Not on file  . Marital Status: Not on file  Intimate Partner Violence:   . Fear of Current or Ex-Partner: Not on file  . Emotionally Abused: Not on file  . Physically Abused: Not on file  . Sexually Abused: Not on file      Allergies as of 10/28/2019      Reactions   Ace Inhibitors Cough   Aspirin Nausea Only, Other (See Comments)   Full strength only   Spironolactone Rash      Medication List       Accurate as of October 28, 2019 11:59 PM. If you have any questions, ask your nurse or doctor.        STOP taking these medications   amitriptyline 10 MG tablet Commonly known as: ELAVIL Stopped by: [name], MD     TAKE these medications   amLODipine 2.5 MG tablet Commonly known as: NORVASC Take 1 tablet (2.5 mg total) by mouth daily.   aspirin 81 MG chewable tablet Chew 81 mg by mouth daily.   atenolol 50 MG tablet Commonly known as: TENORMIN Take 1 tablet (50 mg total) by mouth daily.   azelastine 0.1 % nasal spray Commonly known as: ASTELIN Place 2 sprays into both nostrils 2 (two) times daily. Use in each nostril as directed   Claritin 10 MG tablet Generic drug: loratadine Take 10 mg by mouth daily as needed for allergies.   dextromethorphan-guaiFENesin 30-600 MG 12hr tablet Commonly known as: MUCINEX DM Take 1 tablet by mouth 2 (two) times daily.   diclofenac 1.3 % Ptch Commonly known as: FLECTOR Place 1 patch onto the skin 2 (two) times daily.   docusate sodium 100 MG capsule Commonly known  as: COLACE Take 100 mg by mouth daily as needed for mild constipation.   dorzolamidel-timolol 22.3-6.8 MG/ML Soln ophthalmic solution Commonly known as: COSOPT Place 1 drop into both eyes 2 (two) times daily.   fenofibrate 48 MG tablet Commonly known as: TRICOR Take 48 mg by mouth daily.   ICAPS AREDS FORMULA PO Take 1 capsule by mouth daily.   ipratropium 0.06 % nasal spray Commonly known as: ATROVENT Place 2  sprays into both nostrils 4 (four) times daily.   meclizine 25 MG tablet Commonly known as: Antivert Take 1 tablet (25 mg total) by mouth 2 (two) times daily as needed for dizziness.   MELATONIN PO Take 1 tablet by mouth at bedtime as needed (sleep).   meloxicam 7.5 MG tablet Commonly known as: Mobic Take 1 tablet (7.5 mg total) by mouth daily as needed for up to 14 doses for pain.   multivitamin capsule Take 1 capsule by mouth daily.   omeprazole 20 MG capsule Commonly known as: PRILOSEC Take 20 mg by mouth daily.   senna 8.6 MG tablet Commonly known as: SENOKOT Take 1 tablet by mouth daily as needed for constipation.   simvastatin 10 MG tablet Commonly known as: ZOCOR Take 1 tablet (10 mg total) by mouth at bedtime.   tamsulosin 0.4 MG Caps capsule Commonly known as: FLOMAX Take 1 capsule (0.4 mg total) by mouth daily after supper.           Objective:   Physical Exam BP 140/69 (BP Location: Left Arm, Patient Position: Sitting, Cuff Size: Small)   Pulse 64   Temp (!) 96.5 F (35.8 C) (Temporal)   Resp 18   Ht 5\' 8"  (1.727 m)   Wt 152 lb (68.9 kg)   SpO2 100%   BMI 23.11 kg/m  General:   Well developed, NAD, BMI noted. HEENT:  Normocephalic . Face symmetric, atraumatic Lungs:  Dry crackles, both bases Normal respiratory effort, no intercostal retractions, no accessory muscle use. Heart: RRR,  no murmur.  No pretibial edema bilaterally  Skin: Not pale. Not jaundice Neurologic:  alert & oriented X3.  Speech normal, gait appropriate for  age and unassisted Psych--  Cognition and judgment appear intact.  Cooperative with normal attention span and concentration.  Behavior appropriate. No anxious or depressed appearing.      Assessment     Assessment HTN Hyperlipidemia GERD Insomnia-- used to use elavil, re start  prn All rhinitis CV: --CAD, MI 1989, stress test (-) 2011 ,lexiscan no ischemia  08-2016  --CHF --AAA, s/p repair 2003, [place] 2010 ok Opht: glaucoma, macular degeneration  [facility]  Pulmonary fibrosis (on clinical grounds, see note from 10/2019)  PLAN: HTN: Controlled on amlodipine, atenol.  Check a BMP Hyperlipidemia: On simvastatin and TriCor, check a FLP. Insomnia: Doing well on OTCs, not taking Elavil. LUTS: Improved, on Flomax. Left low back pain with radiculopathy: Improved, on meloxicam very rarely. Pulmonary fibrosis: The patient + dry crackles at bases, chest x-ray 03/2019 show bilateral bases scaring.  He is essentially asymptomatic, do not recommend further work-up  RTC 6 months   This visit occurred during the SARS-CoV-2 public health emergency.  Safety protocols were in place, including screening questions prior to the visit, additional usage of staff PPE, and extensive cleaning of exam room while observing appropriate contact time as indicated for disinfecting solutions.

## 2019-10-29 DIAGNOSIS — J8410 Pulmonary fibrosis, unspecified: Secondary | ICD-10-CM | POA: Insufficient documentation

## 2019-10-29 HISTORY — DX: Pulmonary fibrosis, unspecified: J84.10

## 2019-10-29 NOTE — Assessment & Plan Note (Signed)
HTN: Controlled on amlodipine, atenol.  Check a BMP Hyperlipidemia: On simvastatin and TriCor, check a FLP. Insomnia: Doing well on OTCs, not taking Elavil. LUTS: Improved, on Flomax. Left low back pain with radiculopathy: Improved, on meloxicam very rarely. Pulmonary fibrosis: The patient + dry crackles at bases, chest x-ray 03/2019 show bilateral bases scaring.  He is essentially asymptomatic, do not recommend further work-up  RTC 6 months

## 2019-11-10 ENCOUNTER — Encounter (INDEPENDENT_AMBULATORY_CARE_PROVIDER_SITE_OTHER): Payer: Medicare Other | Admitting: Ophthalmology

## 2019-11-23 ENCOUNTER — Encounter (INDEPENDENT_AMBULATORY_CARE_PROVIDER_SITE_OTHER): Payer: Medicare Other | Admitting: Ophthalmology

## 2019-11-23 ENCOUNTER — Other Ambulatory Visit: Payer: Self-pay

## 2019-11-23 DIAGNOSIS — H353231 Exudative age-related macular degeneration, bilateral, with active choroidal neovascularization: Secondary | ICD-10-CM | POA: Diagnosis not present

## 2019-11-23 DIAGNOSIS — H43813 Vitreous degeneration, bilateral: Secondary | ICD-10-CM

## 2019-11-23 DIAGNOSIS — H35033 Hypertensive retinopathy, bilateral: Secondary | ICD-10-CM | POA: Diagnosis not present

## 2019-11-23 DIAGNOSIS — I10 Essential (primary) hypertension: Secondary | ICD-10-CM

## 2019-12-03 ENCOUNTER — Telehealth: Payer: Self-pay | Admitting: *Deleted

## 2019-12-03 NOTE — Telephone Encounter (Signed)
Copied from [place] [id number]. Topic: General - Inquiry >> Nov 27, 2019 11:33 AM Richardo Priest, NT wrote: Reason for CRM: Pt's wife called in stating they would like to know if it would be safe for pt to receive vaccination given the age and medical history. Please advise.

## 2019-12-05 NOTE — Telephone Encounter (Signed)
Yes, he needs to be vaccinated as soon as that is offered to him.

## 2019-12-06 ENCOUNTER — Other Ambulatory Visit: Payer: Self-pay | Admitting: Physician Assistant

## 2019-12-07 NOTE — Telephone Encounter (Signed)
Patient wife notified of note

## 2019-12-08 ENCOUNTER — Other Ambulatory Visit: Payer: Self-pay | Admitting: Orthopaedic Surgery

## 2019-12-08 MED ORDER — PREDNISONE 10 MG (21) PO TBPK: ORAL_TABLET | ORAL | 0 refills | Status: DC

## 2019-12-09 ENCOUNTER — Encounter: Payer: Self-pay | Admitting: Cardiology

## 2019-12-09 ENCOUNTER — Ambulatory Visit: Payer: Medicare Other | Admitting: Cardiology

## 2019-12-09 ENCOUNTER — Other Ambulatory Visit: Payer: Self-pay

## 2019-12-09 VITALS — BP 148/78 | HR 53 | Ht 68.0 in | Wt 156.8 lb

## 2019-12-09 DIAGNOSIS — I2510 Atherosclerotic heart disease of native coronary artery without angina pectoris: Secondary | ICD-10-CM

## 2019-12-09 DIAGNOSIS — I10 Essential (primary) hypertension: Secondary | ICD-10-CM | POA: Diagnosis not present

## 2019-12-09 NOTE — Patient Instructions (Signed)
Medication Instructions:   Your physician recommends that you continue on your current medications as directed. Please refer to the Current Medication list given to you today.  *If you need a refill on your cardiac medications before your next appointment, please call your pharmacy*    Follow-Up: At [facility], you and your health needs are our priority.  As part of our continuing mission to provide you with exceptional heart care, we have created designated Provider Care Teams.  These Care Teams include your primary Cardiologist (physician) and Advanced Practice Providers (APPs -  Physician Assistants and Nurse Practitioners) who all work together to provide you with the care you need, when you need it.  Your next appointment:   6 month(s)  The format for your next appointment:   In Person  Provider:   Katarina Nelson, MD    

## 2019-12-09 NOTE — Progress Notes (Signed)
12/09/2019 Christopher Hogan   1925-05-29  [id number]  Primary Physician Colon Branch, MD Primary Cardiologist: Dr Meda Coffee  HPI:  Pleasant,  89-y/o male with a history of remote inferior wall MI treated with PTCA. His last Myoview was 2017 and showed inferior scar without ischemia. He has LVD with an EF of 33-48%. In addition he has treated HTN, HLD, and is s/p AAA repair in 2003. He has done well from a cardiac standpoint. He is in the office today accompanied by his wife for pre op clearance prior to Lt inguinal hernia repair.   12/09/19 - the patient is coming after 1 year, he has been doing well, denies any chest pain or shortness, no lower extremity edema orthopnea, paroximal nocturnal dyspnea.  He had one episode of fall last year in May when he was working in a garden and fell down after he stood up.  He bruised his ribs.  Current Outpatient Medications  Medication Sig Dispense Refill  . amitriptyline (ELAVIL) 10 MG tablet Take 1 tablet (10 mg total) by mouth at bedtime as needed for sleep. 30 tablet 3  . amLODipine (NORVASC) 2.5 MG tablet Take 1 tablet (2.5 mg total) by mouth daily. 90 tablet 3  . aspirin 81 MG chewable tablet Chew 81 mg by mouth daily.     Marland Kitchen atenolol (TENORMIN) 50 MG tablet TAKE ONE TABLET BY MOUTH EVERY DAY 90 tablet 3  . azelastine (ASTELIN) 0.1 % nasal spray Place 2 sprays into both nostrils 2 (two) times daily. Use in each nostril as directed 30 mL 3  . Dorzolamide HCl-Timolol Mal PF 22.3-6.8 MG/ML SOLN Apply 1 drop to eye 2 (two) times daily. bith eyes    . gemfibrozil (LOPID) 600 MG tablet TAKE ONE TABLET BY MOUTH EVERY DAY 90 tablet 3  . loratadine (CLARITIN) 10 MG tablet Take 10 mg by mouth daily.    . meclizine (ANTIVERT) 25 MG tablet Take 1 tablet (25 mg total) by mouth 2 (two) times daily as needed for dizziness. 60 tablet 1  . Multiple Vitamin (MULTIVITAMIN) capsule Take 1 capsule by mouth daily.      . Multiple Vitamins-Minerals (ICAPS AREDS FORMULA PO)  Take 1 capsule by mouth daily.    Marland Kitchen omeprazole (PRILOSEC) 20 MG capsule Take 20 mg by mouth daily.      . simvastatin (ZOCOR) 10 MG tablet Take 1 tablet (10 mg total) by mouth at bedtime. 90 tablet 3  . traMADol (ULTRAM) 50 MG tablet Take 1 tablet (50 mg total) by mouth every 6 (six) hours as needed. 10 tablet 0   No current facility-administered medications for this visit.     Allergies  Allergen Reactions  . Ace Inhibitors Cough  . Aspirin Nausea Only and Other (See Comments)    Full strength only  . Spironolactone Rash    Past Medical History:  Diagnosis Date  . AAA (abdominal aortic aneurysm) ([place])    repaired 02/2002, renal ultrasound, Sept 2010: normal abdominal aorta, normal renal arteries   . Age-related macular degeneration, wet, left eye ([place])   . Allergic rhinitis   . Arthritis   . At risk for pulmonary fibrosis 06/25/2018  . CAD (coronary artery disease)    stable stress test 7/11  . CHF (congestive heart failure) ([place])   . Chronic systolic CHF (congestive heart failure) ([place]) 12/17/2013  . Coronary atherosclerosis 06/09/2007   H/O inferior wall MI- PTCA- 1989 Last stress test 2/17- inferior scar, no ischemia    .  DEGENERATIVE JOINT DISEASE 03/17/2010   Qualifier: Diagnosis of  By: Dawson Bills    . Dyslipidemia 05/06/2007   LDL 59 Jan 2019     . Eczema 07/09/2013  . ED (erectile dysfunction)   . Epidermoid cyst of skin of chest 09/29/2018  . ERECTILE DYSFUNCTION 12/09/2007   Qualifier: Diagnosis of  By: Larose Kells MD, [place] hypertension 04/30/2007   Qualifier: Diagnosis of  By: Larose Kells MD, [place] GERD 07/21/2008   Qualifier: Diagnosis of  By: Larose Kells MD, [place] GERD (gastroesophageal reflux disease)   . Glaucoma and macular degeneration 12/31/2011  . Hyperlipidemia   . Hypertension   . Inguinal hernia 12/31/2011  . Insomnia 02/22/2018  . Ischemic cardiomyopathy 06/23/2018   Last EF 30-44% by Myoview Feb 2017  . Left inguinal hernia 06/30/2018  . Myocardial  infarction ([place]) 1989   interior wall infarction  . PCP notes >>>>>>>>>>>>>>>>>>>> 07/17/2016  . Pneumonia 2019  . Pre-operative cardiovascular examination 12/31/2011  . Rhinitis 04/20/2008   Qualifier: Diagnosis of  By: Larose Kells MD, [place] Vertigo 06/20/2015    Social History   Socioeconomic History  . Marital status: Married    Spouse name: Not on file  . Number of children: 3  . Years of education: Not on file  . Highest education level: Not on file  Occupational History  . Occupation: retired     Fish farm manager: RETIRED  Tobacco Use  . Smoking status: Former Research scientist (life sciences)  . Smokeless tobacco: Never Used  . Tobacco comment: quit in 1953  Substance and Sexual Activity  . Alcohol use: No  . Drug use: No  . Sexual activity: Not Currently  Other Topics Concern  . Not on file  Social History Narrative   WWII veteran   Independent on his ADL     Lives w/ wife   Social Determinants of Health   Financial Resource Strain:   . Difficulty of Paying Living Expenses: Not on file  Food Insecurity:   . Worried About Charity fundraiser in the Last Year: Not on file  . Ran Out of Food in the Last Year: Not on file  Transportation Needs:   . Lack of Transportation (Medical): Not on file  . Lack of Transportation (Non-Medical): Not on file  Physical Activity:   . Days of Exercise per Week: Not on file  . Minutes of Exercise per Session: Not on file  Stress:   . Feeling of Stress : Not on file  Social Connections:   . Frequency of Communication with Friends and Family: Not on file  . Frequency of Social Gatherings with Friends and Family: Not on file  . Attends Religious Services: Not on file  . Active Member of Clubs or Organizations: Not on file  . Attends Archivist Meetings: Not on file  . Marital Status: Not on file  Intimate Partner Violence:   . Fear of Current or Ex-Partner: Not on file  . Emotionally Abused: Not on file  . Physically Abused: Not on file  . Sexually  Abused: Not on file     Family History  Problem Relation Age of Onset  . Lung cancer Father   . CAD Other        mother?  . Kidney Stones Son   . Prostate cancer Neg Hx   . Diabetes Neg Hx   . Colon cancer Neg Hx  Review of Systems: General: negative for chills, fever, night sweats or weight changes.  Cardiovascular: negative for chest pain, dyspnea on exertion, edema, orthopnea, palpitations, paroxysmal nocturnal dyspnea or shortness of breath Dermatological: negative for rash Respiratory: negative for cough or wheezing Urologic: negative for hematuria Abdominal: negative for nausea, vomiting, diarrhea, bright red blood per rectum, melena, or hematemesis Neurologic: negative for visual changes, syncope, or dizziness All other systems reviewed and are otherwise negative except as noted above.  Blood pressure (!) 148/78, pulse (!) 53, height 5\' 8"  (1.727 m), weight 156 lb 12.8 oz (71.1 kg), SpO2 95 %.  General appearance: alert, cooperative and no distress Neck: no carotid bruit and no JVD Lungs: velcro crackles at bases (scarring on prior CXR) Heart: regular rate and rhythm Abdomen: soft, non-tender; bowel sounds normal; no masses,  no organomegaly and midline surgical scar Extremities: no edema Pulses: 2+ and symmetric Skin: cool, pale, dry Neurologic: Grossly normal  EKG performed today December 09, 2019 shows sinus rhythm with LVH with repolarization changes, this is unchanged from prior.   ASSESSMENT AND PLAN:   1.  CAD -stable asymptomatic, continue low-dose aspirin, simvastatin and fenofibrate, he has no memory impairment, he is tolerating it well.  He is EKG today is unchanged from prior.  2.  Hypertension -slightly elevated, however episode of orthostatic hypotension with fall and visit to the ER, I would continue the same management.  3.  Hyperlipidemia -as above.  Follow-up in 6 months.  Ena Dawley, MD 12/09/2019, 1:50 PM

## 2019-12-14 ENCOUNTER — Telehealth: Payer: Self-pay | Admitting: Orthopaedic Surgery

## 2019-12-14 NOTE — Telephone Encounter (Signed)
Patient's wife called and stated has finished prednisone and now he is some better but still having pain. No sleep last night due to pain. Took last pill this morning. Wondering if he should give prednisone a liitle more time or should he make appt to come in.  Please call patient [phone]

## 2019-12-15 ENCOUNTER — Ambulatory Visit: Payer: Medicare Other | Admitting: Orthopaedic Surgery

## 2019-12-15 ENCOUNTER — Other Ambulatory Visit: Payer: Self-pay

## 2019-12-15 ENCOUNTER — Encounter: Payer: Self-pay | Admitting: Orthopaedic Surgery

## 2019-12-15 VITALS — Ht 68.0 in | Wt 158.0 lb

## 2019-12-15 DIAGNOSIS — M545 Low back pain, unspecified: Secondary | ICD-10-CM

## 2019-12-15 DIAGNOSIS — M5442 Lumbago with sciatica, left side: Secondary | ICD-10-CM | POA: Diagnosis not present

## 2019-12-15 MED ORDER — HYDROCODONE-ACETAMINOPHEN 5-325 MG PO TABS: 0.5000 | ORAL_TABLET | Freq: Three times a day (TID) | ORAL | 0 refills | Status: DC | PRN

## 2019-12-15 NOTE — Telephone Encounter (Signed)
Order made

## 2019-12-15 NOTE — Telephone Encounter (Signed)
Lets get MRI lumbar spine

## 2019-12-15 NOTE — Progress Notes (Signed)
Office Visit Note   Patient: Christopher Hogan           Date of Birth: 1925-08-06           MRN: [id number] Visit Date: 12/15/2019              Requested by: Colon Branch, [address] [place],  [place] [address] PCP: Colon Branch, MD   Assessment & Plan: Visit Diagnoses:  1. Acute left-sided low back pain with left-sided sciatica     Plan: Given the lack of improvement from the prednisone Dosepak we will need to move forward with an MRI of the lumbar spine to evaluate for structural abnormalities.  Small supply of hydrocodone prescribed today for breakthrough pain.  Follow-up after the MRI.  Follow-Up Instructions: Return in about 10 days (around 12/25/2019).   Orders:  No orders of the defined types were placed in this encounter.  Meds ordered this encounter  Medications  . HYDROcodone-acetaminophen (NORCO) 5-325 MG tablet    Sig: Take 0.5-1 tablets by mouth 3 (three) times daily as needed.    Dispense:  20 tablet    Refill:  0      Procedures: No procedures performed   Clinical Data: No additional findings.   Subjective: Chief Complaint  Patient presents with  . Left Hip - Pain    Christopher Hogan returns today for continued lumbar radiculopathy in his left leg.  The prednisone Dosepak helped very temporarily but the pain has returned.  He is currently taking Aleve and Tylenol with partial relief.   Review of Systems   Objective: Vital Signs: Ht 5\' 8"  (1.727 m)   Wt 158 lb (71.7 kg)   BMI 24.02 kg/m   Physical Exam  Ortho Exam Lumbar and left lower extremity exam are unchanged. Specialty Comments:  No specialty comments available.  Imaging: No results found.   PMFS History: Patient Active Problem List   Diagnosis Date Noted  . Pulmonary fibrosis ([place]) 10/29/2019  . Epidermoid cyst of skin of chest 09/29/2018  . Left inguinal hernia 06/30/2018  . At risk for pulmonary fibrosis 06/25/2018  . Ischemic cardiomyopathy 06/23/2018  . Insomnia  02/22/2018  . PCP notes >>>>>>>>>>>>>>>>>>>> 07/17/2016  . Vertigo 06/20/2015  . Chronic systolic CHF (congestive heart failure) ([place]) 12/17/2013  . Eczema 07/09/2013  . Annual physical exam 12/31/2011  . Inguinal hernia 12/31/2011  . Glaucoma and macular degeneration 12/31/2011  . DEGENERATIVE JOINT DISEASE 03/17/2010  . GERD 07/21/2008  . Rhinitis 04/20/2008  . ERECTILE DYSFUNCTION 12/09/2007  . Coronary atherosclerosis 06/09/2007  . Dyslipidemia 05/06/2007  . Essential hypertension 04/30/2007   Past Medical History:  Diagnosis Date  . AAA (abdominal aortic aneurysm) ([place])    repaired 02/2002, renal ultrasound, Sept 2010: normal abdominal aorta, normal renal arteries   . Age-related macular degeneration, wet, left eye ([place])   . Allergic rhinitis   . Arthritis   . At risk for pulmonary fibrosis 06/25/2018  . CAD (coronary artery disease)    stable stress test 7/11  . CHF (congestive heart failure) ([place])   . Chronic systolic CHF (congestive heart failure) ([place]) 12/17/2013  . Coronary atherosclerosis 06/09/2007   H/O inferior wall MI- PTCA- 1989 Last stress test 2/17- inferior scar, no ischemia    . DEGENERATIVE JOINT DISEASE 03/17/2010   Qualifier: Diagnosis of  By: Dawson Bills    . Dyslipidemia 05/06/2007   LDL 59 Jan 2019     . Eczema 07/09/2013  .  ED (erectile dysfunction)   . Epidermoid cyst of skin of chest 09/29/2018  . ERECTILE DYSFUNCTION 12/09/2007   Qualifier: Diagnosis of  By: Larose Kells MD, [place] hypertension 04/30/2007   Qualifier: Diagnosis of  By: Larose Kells MD, [place] GERD 07/21/2008   Qualifier: Diagnosis of  By: Larose Kells MD, [place] GERD (gastroesophageal reflux disease)   . Glaucoma and macular degeneration 12/31/2011  . Hyperlipidemia   . Hypertension   . Inguinal hernia 12/31/2011  . Insomnia 02/22/2018  . Ischemic cardiomyopathy 06/23/2018   Last EF 30-44% by Myoview Feb 2017  . Left inguinal hernia 06/30/2018  . Myocardial infarction ([place]) 1989    interior wall infarction  . PCP notes >>>>>>>>>>>>>>>>>>>> 07/17/2016  . Pneumonia 2019  . Pre-operative cardiovascular examination 12/31/2011  . Rhinitis 04/20/2008   Qualifier: Diagnosis of  By: Larose Kells MD, [place] Vertigo 06/20/2015    Family History  Problem Relation Age of Onset  . Lung cancer Father   . CAD Other        mother?  . Kidney Stones Son   . Prostate cancer Neg Hx   . Diabetes Neg Hx   . Colon cancer Neg Hx     Past Surgical History:  Procedure Laterality Date  . AAA repair  2003  . CARDIAC CATHETERIZATION    . CHOLECYSTECTOMY    . INGUINAL HERNIA REPAIR Right 1985  . INGUINAL HERNIA REPAIR Left 06/30/2018   open; w/mesh  . INGUINAL HERNIA REPAIR Left 06/30/2018   Procedure: OPEN LEFT INGUINAL HERNIA REPAIR;  Surgeon: Fanny Skates, MD;  Location: [facility];  Service: General;  Laterality: Left;  . INSERTION OF MESH Left 06/30/2018   Procedure: INSERTION OF MESH;  Surgeon: Fanny Skates, MD;  Location: [facility];  Service: General;  Laterality: Left;  Marland Kitchen MASS EXCISION N/A 09/29/2018   Procedure: EXCISION OF 3 CM MASS ANTERIOR CHEST WALL;  Surgeon: Fanny Skates, MD;  Location: [facility];  Service: General;  Laterality: N/A;  . [place] History   Occupational History  . Occupation: retired     Fish farm manager: RETIRED  Tobacco Use  . Smoking status: Former Research scientist (life sciences)  . Smokeless tobacco: Never Used  . Tobacco comment: quit in 1953  Substance and Sexual Activity  . Alcohol use: No  . Drug use: No  . Sexual activity: Not Currently

## 2019-12-21 ENCOUNTER — Encounter (INDEPENDENT_AMBULATORY_CARE_PROVIDER_SITE_OTHER): Payer: Medicare Other | Admitting: Ophthalmology

## 2019-12-22 ENCOUNTER — Encounter (INDEPENDENT_AMBULATORY_CARE_PROVIDER_SITE_OTHER): Payer: Medicare Other | Admitting: Ophthalmology

## 2019-12-22 DIAGNOSIS — H35033 Hypertensive retinopathy, bilateral: Secondary | ICD-10-CM | POA: Diagnosis not present

## 2019-12-22 DIAGNOSIS — H353231 Exudative age-related macular degeneration, bilateral, with active choroidal neovascularization: Secondary | ICD-10-CM

## 2019-12-22 DIAGNOSIS — H43813 Vitreous degeneration, bilateral: Secondary | ICD-10-CM

## 2019-12-22 DIAGNOSIS — I10 Essential (primary) hypertension: Secondary | ICD-10-CM | POA: Diagnosis not present

## 2019-12-23 DIAGNOSIS — H401113 Primary open-angle glaucoma, right eye, severe stage: Secondary | ICD-10-CM | POA: Diagnosis not present

## 2019-12-28 ENCOUNTER — Telehealth: Payer: Self-pay

## 2019-12-28 NOTE — Telephone Encounter (Signed)
Patient Wife Valls called in to ask questions about which medications does the patient is suppose to take. Can someone please follow up with the patient wife Mrs. Sherod at  [phone] as soon as possible.  Thanks,

## 2019-12-28 NOTE — Telephone Encounter (Signed)
Spoke w/ Pt's wife [name]- had questions regarding omeprazole and Flomax- informed omeprazole is for GERD and Flomax is for urinary sx's/prostate issues. Thelma verbalized understanding.

## 2020-01-04 ENCOUNTER — Ambulatory Visit
Admission: RE | Admit: 2020-01-04 | Discharge: 2020-01-04 | Disposition: A | Discharge status: Home / Self Care | Payer: Medicare Other | Source: Ambulatory Visit | Attending: Orthopaedic Surgery | Admitting: Orthopaedic Surgery

## 2020-01-04 DIAGNOSIS — M545 Low back pain, unspecified: Secondary | ICD-10-CM

## 2020-01-04 DIAGNOSIS — M48061 Spinal stenosis, lumbar region without neurogenic claudication: Secondary | ICD-10-CM | POA: Diagnosis not present

## 2020-01-06 ENCOUNTER — Other Ambulatory Visit: Payer: Self-pay

## 2020-01-06 ENCOUNTER — Ambulatory Visit: Payer: Medicare Other | Admitting: Orthopaedic Surgery

## 2020-01-06 ENCOUNTER — Encounter: Payer: Self-pay | Admitting: Orthopaedic Surgery

## 2020-01-06 DIAGNOSIS — M5416 Radiculopathy, lumbar region: Secondary | ICD-10-CM

## 2020-01-06 MED ORDER — HYDROCODONE-ACETAMINOPHEN 5-325 MG PO TABS: ORAL_TABLET | ORAL | 0 refills | Status: DC

## 2020-01-06 NOTE — Progress Notes (Signed)
Office Visit Note   Patient: Christopher Hogan           Date of Birth: [birth year]           MRN: [id number] Visit Date: 01/06/2020              Requested by: Colon Branch, [address] [place],  [place] [address] PCP: Colon Branch, MD   Assessment & Plan: Visit Diagnoses:  1. Radiculopathy, lumbar region     Plan: Impression is left foraminal and paracentral extrusion with severe compression L5-S1 nerve roots as well as mild to moderate spinal stenosis L2-3 to L4-5 and foraminal impingement on the right at L3-4 bilaterally at L4-5.  At this point, we will refer the patient to Dr. Ernestina Patches for [facility].  He will follow-up with [place] as needed.  Follow-Up Instructions: Return if symptoms worsen or fail to improve.   Orders:  No orders of the defined types were placed in this encounter.  Meds ordered this encounter  Medications  . HYDROcodone-acetaminophen (NORCO) 5-325 MG tablet    Sig: 0.5-1 tablet po bid prn pain    Dispense:  20 tablet    Refill:  0      Procedures: No procedures performed   Clinical Data: No additional findings.   Subjective: Chief Complaint  Patient presents with  . Lower Back - Pain    HPI patient is a pleasant 86 year old gentleman who comes in today to discuss MRI results of the lumbar spine.  History of left-sided lumbar pain and left lower extremity radiculopathy.  Previous trial of thyroids provided no relief of symptoms.  MRI was ordered which showed a large left foraminal to paracentral extrusion with severe compression of the L5 and S1 nerve roots.  This also showed mild to moderate spinal stenosis at L2-3 to L4-5 and foraminal impingement on the right at L3-4 and bilaterally at L4-5.  He continues to have symptoms to the left lower extremity.     Objective: Vital Signs: There were no vitals taken for this visit.    Ortho Exam stable lumbar exam  Specialty Comments:  No specialty comments available.  Imaging: No new  imaging   PMFS History: Patient Active Problem List   Diagnosis Date Noted  . Pulmonary fibrosis ([place]) 10/29/2019  . Epidermoid cyst of skin of chest 09/29/2018  . Left inguinal hernia 06/30/2018  . At risk for pulmonary fibrosis 06/25/2018  . Ischemic cardiomyopathy 06/23/2018  . Insomnia 02/22/2018  . PCP notes >>>>>>>>>>>>>>>>>>>> 07/17/2016  . Vertigo 06/20/2015  . Chronic systolic CHF (congestive heart failure) ([place]) 12/17/2013  . Eczema 07/09/2013  . Annual physical exam 12/31/2011  . Inguinal hernia 12/31/2011  . Glaucoma and macular degeneration 12/31/2011  . DEGENERATIVE JOINT DISEASE 03/17/2010  . GERD 07/21/2008  . Rhinitis 04/20/2008  . ERECTILE DYSFUNCTION 12/09/2007  . Coronary atherosclerosis 06/09/2007  . Dyslipidemia 05/06/2007  . Essential hypertension 04/30/2007   Past Medical History:  Diagnosis Date  . AAA (abdominal aortic aneurysm) ([place])    repaired 02/2002, renal ultrasound, Sept 2010: normal abdominal aorta, normal renal arteries   . Age-related macular degeneration, wet, left eye ([place])   . Allergic rhinitis   . Arthritis   . At risk for pulmonary fibrosis 06/25/2018  . CAD (coronary artery disease)    stable stress test 7/11  . CHF (congestive heart failure) ([place])   . Chronic systolic CHF (congestive heart failure) ([place]) 12/17/2013  . Coronary atherosclerosis  06/09/2007   H/O inferior wall MI- PTCA- 1989 Last stress test 2/17- inferior scar, no ischemia    . DEGENERATIVE JOINT DISEASE 03/17/2010   Qualifier: Diagnosis of  By: Dawson Bills    . Dyslipidemia 05/06/2007   LDL 59 Jan 2019     . Eczema 07/09/2013  . ED (erectile dysfunction)   . Epidermoid cyst of skin of chest 09/29/2018  . ERECTILE DYSFUNCTION 12/09/2007   Qualifier: Diagnosis of  By: Larose Kells MD, [place] hypertension 04/30/2007   Qualifier: Diagnosis of  By: Larose Kells MD, [place] GERD 07/21/2008   Qualifier: Diagnosis of  By: Larose Kells MD, [place] GERD (gastroesophageal reflux  disease)   . Glaucoma and macular degeneration 12/31/2011  . Hyperlipidemia   . Hypertension   . Inguinal hernia 12/31/2011  . Insomnia 02/22/2018  . Ischemic cardiomyopathy 06/23/2018   Last EF 30-44% by Myoview Feb 2017  . Left inguinal hernia 06/30/2018  . Myocardial infarction ([place]) 1989   interior wall infarction  . PCP notes >>>>>>>>>>>>>>>>>>>> 07/17/2016  . Pneumonia 2019  . Pre-operative cardiovascular examination 12/31/2011  . Rhinitis 04/20/2008   Qualifier: Diagnosis of  By: Larose Kells MD, [place] Vertigo 06/20/2015    Family History  Problem Relation Age of Onset  . Lung cancer Father   . CAD Other        mother?  . Kidney Stones Son   . Prostate cancer Neg Hx   . Diabetes Neg Hx   . Colon cancer Neg Hx     Past Surgical History:  Procedure Laterality Date  . AAA repair  2003  . CARDIAC CATHETERIZATION    . CHOLECYSTECTOMY    . INGUINAL HERNIA REPAIR Right 1985  . INGUINAL HERNIA REPAIR Left 06/30/2018   open; w/mesh  . INGUINAL HERNIA REPAIR Left 06/30/2018   Procedure: OPEN LEFT INGUINAL HERNIA REPAIR;  Surgeon: Fanny Skates, MD;  Location: [facility];  Service: General;  Laterality: Left;  . INSERTION OF MESH Left 06/30/2018   Procedure: INSERTION OF MESH;  Surgeon: Fanny Skates, MD;  Location: [facility];  Service: General;  Laterality: Left;  Marland Kitchen MASS EXCISION N/A 09/29/2018   Procedure: EXCISION OF 3 CM MASS ANTERIOR CHEST WALL;  Surgeon: Fanny Skates, MD;  Location: [facility];  Service: General;  Laterality: N/A;  . [place] History   Occupational History  . Occupation: retired     Fish farm manager: RETIRED  Tobacco Use  . Smoking status: Former Research scientist (life sciences)  . Smokeless tobacco: Never Used  . Tobacco comment: quit in 1953  Substance and Sexual Activity  . Alcohol use: No  . Drug use: No  . Sexual activity: Not Currently

## 2020-01-21 ENCOUNTER — Encounter (INDEPENDENT_AMBULATORY_CARE_PROVIDER_SITE_OTHER): Payer: Medicare Other | Admitting: Ophthalmology

## 2020-01-21 DIAGNOSIS — H43813 Vitreous degeneration, bilateral: Secondary | ICD-10-CM

## 2020-01-21 DIAGNOSIS — I10 Essential (primary) hypertension: Secondary | ICD-10-CM

## 2020-01-21 DIAGNOSIS — H353231 Exudative age-related macular degeneration, bilateral, with active choroidal neovascularization: Secondary | ICD-10-CM | POA: Diagnosis not present

## 2020-01-21 DIAGNOSIS — H35033 Hypertensive retinopathy, bilateral: Secondary | ICD-10-CM

## 2020-01-28 ENCOUNTER — Telehealth: Payer: Self-pay | Admitting: Internal Medicine

## 2020-01-28 NOTE — Telephone Encounter (Signed)
  Caller:Patient's Christopher Hogan   Call Back: [phone]  Please advise whether patient should take 2nd covid vaccine due to taking steroids in the past.Patient also states that he taking  HYDROcodone-acetaminophen (NORCO) 5-325 MG tablet . Appt is tomorrow for Covid Vaccine Should this effect patient taking Vaccine ?

## 2020-01-29 NOTE — Telephone Encounter (Signed)
Left detailed message on machine.

## 2020-01-29 NOTE — Telephone Encounter (Signed)
I noticed that he was prescribed prednisone 12/08/2019.  By now is okay to proceed with a Covid vaccinations. That will not interfere with hydrocodone.

## 2020-02-01 ENCOUNTER — Encounter: Payer: Self-pay | Admitting: Physical Medicine and Rehabilitation

## 2020-02-01 ENCOUNTER — Ambulatory Visit (INDEPENDENT_AMBULATORY_CARE_PROVIDER_SITE_OTHER): Payer: Medicare Other | Admitting: Physical Medicine and Rehabilitation

## 2020-02-01 ENCOUNTER — Other Ambulatory Visit: Payer: Self-pay

## 2020-02-01 ENCOUNTER — Ambulatory Visit: Payer: Self-pay

## 2020-02-01 VITALS — BP 144/84 | HR 71

## 2020-02-01 DIAGNOSIS — M5416 Radiculopathy, lumbar region: Secondary | ICD-10-CM | POA: Diagnosis not present

## 2020-02-01 MED ORDER — HYDROCODONE-ACETAMINOPHEN 5-325 MG PO TABS: ORAL_TABLET | ORAL | 0 refills | Status: DC

## 2020-02-01 MED ORDER — METHYLPREDNISOLONE ACETATE 80 MG/ML IJ SUSP
40.0000 mg | Freq: Once | INTRAMUSCULAR | Status: AC
  Administered 2020-02-01: 40 mg

## 2020-02-01 NOTE — Progress Notes (Signed)
 .  Numeric Pain Rating Scale and Functional Assessment Average Pain 6   In the last MONTH (on 0-10 scale) has pain interfered with the following?  1. General activity like being  able to carry out your everyday physical activities such as walking, climbing stairs, carrying groceries, or moving a chair?  Rating(7)   +Driver, -BT, -Dye Allergies.  

## 2020-02-02 NOTE — Progress Notes (Signed)
Christopher Hogan - 86 y.o. male MRN [id number]  Date of birth: 28-May-1925  Office Visit Note: Visit Date: 02/01/2020 PCP: Colon Branch, MD Referred by: Colon Branch, MD  Subjective: Chief Complaint  Patient presents with  . Lower Back - Pain  . Left Leg - Pain   HPI:  Christopher Hogan is a 86 y.o. male who comes in today For left L5-S1 interlaminar dural steroid injection as requested by Dr. Eduard Roux.  Patient's been having left radicular leg pain in a pretty classic L5 distribution without relief with conservative care.  He has had medication management including a small amount of hydrocodone.  MRI reviewed with the patient today with spine models shows disc herniation at L4-5 impacting the lateral recess and L5 nerve root.  No high-grade compression.  Depending on relief we will have the patient come back in a couple of weeks for potential L5 transforaminal injection.  We did renew his prescription for a small amount of hydrocodone.  We would like to get him under control so he does not need that.  The patient has failed conservative care including home exercise, medications, time and activity modification.  This injection will be diagnostic and hopefully therapeutic.  Please see requesting physician notes for further details and justification.   ROS Otherwise per HPI.  Assessment & Plan: Visit Diagnoses:  1. Lumbar radiculopathy     Plan: No additional findings.   Meds & Orders:  Meds ordered this encounter  Medications  . methylPREDNISolone acetate (DEPO-MEDROL) injection 40 mg  . HYDROcodone-acetaminophen (NORCO) 5-325 MG tablet    Sig: 0.5-1 tablet po bid prn pain    Dispense:  15 tablet    Refill:  0    Orders Placed This Encounter  Procedures  . XR C-ARM NO REPORT  . Epidural Steroid injection    Follow-up: Return in 2 weeks (on 02/15/2020) for potential repeat vs L5 transforaminal epidural.   Procedures: No procedures performed  Lumbar Epidural Steroid Injection -  Interlaminar Approach with Fluoroscopic Guidance  Patient: Christopher Hogan      Date of Birth: [birth year] MRN: [id number] PCP: Colon Branch, MD      Visit Date: 02/01/2020   Universal Protocol:     Consent Given By: the patient  Position: PRONE  Additional Comments: Vital signs were monitored before and after the procedure. Patient was prepped and draped in the usual sterile fashion. The correct patient, procedure, and site was verified.   Injection Procedure Details:  Procedure Site One Meds Administered:  Meds ordered this encounter  Medications  . methylPREDNISolone acetate (DEPO-MEDROL) injection 40 mg  . HYDROcodone-acetaminophen (NORCO) 5-325 MG tablet    Sig: 0.5-1 tablet po bid prn pain    Dispense:  15 tablet    Refill:  0     Laterality: Left  Location/Site:  L5-S1  Needle size: 20 G  Needle type: Tuohy  Needle Placement: Paramedian epidural  Findings:   -Comments: Excellent flow of contrast into the epidural space.  Procedure Details: Using a paramedian approach from the side mentioned above, the region overlying the inferior lamina was localized under fluoroscopic visualization and the soft tissues overlying this structure were infiltrated with 4 ml. of 1% Lidocaine without Epinephrine. The Tuohy needle was inserted into the epidural space using a paramedian approach.   The epidural space was localized using loss of resistance along with lateral and bi-planar fluoroscopic views.  After negative aspirate for air, blood, and CSF,  a 2 ml. volume of Isovue-250 was injected into the epidural space and the flow of contrast was observed. Radiographs were obtained for documentation purposes.    The injectate was administered into the level noted above.   Additional Comments:  The patient tolerated the procedure well Dressing: 2 x 2 sterile gauze and Band-Aid    Post-procedure details: Patient was observed during the procedure. Post-procedure instructions  were reviewed.  Patient left the clinic in stable condition.    Clinical History: MRI LUMBAR SPINE WITHOUT CONTRAST  TECHNIQUE: Multiplanar, multisequence MR imaging of the lumbar spine was performed. No intravenous contrast was administered.  COMPARISON:  Radiography 09/18/2019  FINDINGS: Segmentation:  5 lumbar type vertebrae  Alignment:  Levoscoliosis and exaggerated lumbar lordosis.  Vertebrae:  No evidence of fracture or aggressive bone lesion.  Conus medullaris and cauda equina: Conus extends to the L1 level. Conus and cauda equina appear normal.  Paraspinal and other soft tissues: Evidence of treated abdominal aortic aneurysm when correlated with CTA report from 2003.  Disc levels:  T12- L1: Unremarkable.  L1-L2: Spondylosis and disc narrowing with mild bulge. No impingement  L2-L3: Spondylosis with disc narrowing and bulging. Mild facet spurring. Mild spinal stenosis. No foraminal compression.  L3-L4: Advanced disc narrowing with asymmetric right-sided collapse and bulging/ridging. There is degenerative facet spurring on both sides. Moderate to advanced spinal stenosis with biforaminal impingement that is advanced on the right.  L4-L5: Disc narrowing and bulging with endplate spurring. Facet osteoarthritis. Spinal stenosis and bilateral foraminal stenosis is moderate.  L5-S1:Left paracentral to foraminal extrusion migrates superiorly and severely compresses the L5 and descending S1 nerve roots. Facet osteoarthritis on both sides. Disc narrowing and endplate spurring asymmetric to the left.  IMPRESSION: 1. Sagittal T2 weighted imaging was inadvertently not acquired. The available images show a convincing symptomatic finding such that the patient was not automatically recalled, given his limited mobility. If this is needed on clinical grounds the patient can be rescheduled promptly for the missing images. 2. L5-S1 large left foraminal to  paracentral extrusion with severe compression of the L5 and S1 nerve roots. 3. There is diffuse degenerative disease with levoscoliosis. 4. Mild-to-moderate spinal stenosis at L2-3 to L4-5, especially affecting the subarticular recesses. 5. Foraminal impingement on the right at L3-4 and bilaterally at L4-5.   Electronically Signed   By: Monte Fantasia M.D.   On: 01/04/2020 11:30     Objective:  VS:  HT:    WT:   BMI:     BP:(!) 144/84  HR:71bpm  TEMP: ( )  RESP:  Physical Exam  Ortho Exam Imaging: XR C-ARM NO REPORT  Result Date: 02/01/2020 Please see Notes tab for imaging impression.

## 2020-02-02 NOTE — Procedures (Signed)
Lumbar Epidural Steroid Injection - Interlaminar Approach with Fluoroscopic Guidance  Patient: Christopher Hogan      Date of Birth: 11/21/1924 MRN: [id number] PCP: Colon Branch, MD      Visit Date: 02/01/2020   Universal Protocol:     Consent Given By: the patient  Position: PRONE  Additional Comments: Vital signs were monitored before and after the procedure. Patient was prepped and draped in the usual sterile fashion. The correct patient, procedure, and site was verified.   Injection Procedure Details:  Procedure Site One Meds Administered:  Meds ordered this encounter  Medications  . methylPREDNISolone acetate (DEPO-MEDROL) injection 40 mg  . HYDROcodone-acetaminophen (NORCO) 5-325 MG tablet    Sig: 0.5-1 tablet po bid prn pain    Dispense:  15 tablet    Refill:  0     Laterality: Left  Location/Site:  L5-S1  Needle size: 20 G  Needle type: Tuohy  Needle Placement: Paramedian epidural  Findings:   -Comments: Excellent flow of contrast into the epidural space.  Procedure Details: Using a paramedian approach from the side mentioned above, the region overlying the inferior lamina was localized under fluoroscopic visualization and the soft tissues overlying this structure were infiltrated with 4 ml. of 1% Lidocaine without Epinephrine. The Tuohy needle was inserted into the epidural space using a paramedian approach.   The epidural space was localized using loss of resistance along with lateral and bi-planar fluoroscopic views.  After negative aspirate for air, blood, and CSF, a 2 ml. volume of Isovue-250 was injected into the epidural space and the flow of contrast was observed. Radiographs were obtained for documentation purposes.    The injectate was administered into the level noted above.   Additional Comments:  The patient tolerated the procedure well Dressing: 2 x 2 sterile gauze and Band-Aid    Post-procedure details: Patient was observed during the  procedure. Post-procedure instructions were reviewed.  Patient left the clinic in stable condition.

## 2020-02-16 ENCOUNTER — Ambulatory Visit: Payer: Self-pay

## 2020-02-16 ENCOUNTER — Encounter: Payer: Self-pay | Admitting: Physical Medicine and Rehabilitation

## 2020-02-16 ENCOUNTER — Ambulatory Visit (INDEPENDENT_AMBULATORY_CARE_PROVIDER_SITE_OTHER): Payer: Medicare Other | Admitting: Physical Medicine and Rehabilitation

## 2020-02-16 ENCOUNTER — Other Ambulatory Visit: Payer: Self-pay

## 2020-02-16 VITALS — BP 153/75 | HR 68

## 2020-02-16 DIAGNOSIS — M5416 Radiculopathy, lumbar region: Secondary | ICD-10-CM

## 2020-02-16 MED ORDER — METHYLPREDNISOLONE ACETATE 80 MG/ML IJ SUSP
40.0000 mg | Freq: Once | INTRAMUSCULAR | Status: AC
  Administered 2020-02-16: 40 mg

## 2020-02-16 NOTE — Progress Notes (Signed)
 .  Numeric Pain Rating Scale and Functional Assessment Average Pain 4   In the last MONTH (on 0-10 scale) has pain interfered with the following?  1. General activity like being  able to carry out your everyday physical activities such as walking, climbing stairs, carrying groceries, or moving a chair?  Rating(5)   +Driver, -BT, -Dye Allergies.

## 2020-02-18 NOTE — Procedures (Signed)
Lumbar Epidural Steroid Injection - Interlaminar Approach with Fluoroscopic Guidance  Patient: Christopher Hogan      Date of Birth: [birth year] MRN: [id number] PCP: Colon Branch, MD      Visit Date: 02/16/2020   Universal Protocol:     Consent Given By: the patient  Position: PRONE  Additional Comments: Vital signs were monitored before and after the procedure. Patient was prepped and draped in the usual sterile fashion. The correct patient, procedure, and site was verified.   Injection Procedure Details:  Procedure Site One Meds Administered:  Meds ordered this encounter  Medications  . methylPREDNISolone acetate (DEPO-MEDROL) injection 40 mg     Laterality: Left  Location/Site:  L4-L5  Needle size: 20 G  Needle type: Tuohy  Needle Placement: Paramedian epidural  Findings:   -Comments: Excellent flow of contrast into the epidural space.  Procedure Details: Using a paramedian approach from the side mentioned above, the region overlying the inferior lamina was localized under fluoroscopic visualization and the soft tissues overlying this structure were infiltrated with 4 ml. of 1% Lidocaine without Epinephrine. The Tuohy needle was inserted into the epidural space using a paramedian approach.   The epidural space was localized using loss of resistance along with lateral and bi-planar fluoroscopic views.  After negative aspirate for air, blood, and CSF, a 2 ml. volume of Isovue-250 was injected into the epidural space and the flow of contrast was observed. Radiographs were obtained for documentation purposes.    The injectate was administered into the level noted above.   Additional Comments:  The patient tolerated the procedure well Dressing: 2 x 2 sterile gauze and Band-Aid    Post-procedure details: Patient was observed during the procedure. Post-procedure instructions were reviewed.  Patient left the clinic in stable condition.

## 2020-02-18 NOTE — Progress Notes (Signed)
Christopher Hogan - 86 y.o. male MRN [id number]  Date of birth: 11-27-1924  Office Visit Note: Visit Date: 02/16/2020 PCP: Colon Branch, MD Referred by: Colon Branch, MD  Subjective: Chief Complaint  Patient presents with  . Lower Back - Pain   HPI:  Christopher Hogan is a 86 y.o. male who comes in today For evaluation and possible repeat epidural injection.  Patient had L5-S1 interlaminar injection a couple weeks ago and reports significant relief of his L5 symptoms in the leg.  He still getting a lot of back pain with claudication type symptoms with walking.  MRI does show stenosis at L3-4 and L4-5 centrally and then there is this large disc herniation at L5-S1 on the left.  Because he got pretty good relief with the leg pain but still having the claudication symptoms were going to try the same injection at L4-5 from an interlaminar approach.  Depending on relief could still look at transforaminal approach.  Hopefully the patient will do well with injection and this could be something that is intermittent.  I still have hope that the large disc herniation will start to resolve over time.  His stenosis is not severe.  ROS Otherwise per HPI.  Assessment & Plan: Visit Diagnoses:  1. Lumbar radiculopathy     Plan: No additional findings.   Meds & Orders:  Meds ordered this encounter  Medications  . methylPREDNISolone acetate (DEPO-MEDROL) injection 40 mg    Orders Placed This Encounter  Procedures  . XR C-ARM NO REPORT  . Epidural Steroid injection    Follow-up: Return if symptoms worsen or fail to improve.   Procedures: No procedures performed  Lumbar Epidural Steroid Injection - Interlaminar Approach with Fluoroscopic Guidance  Patient: Christopher Hogan      Date of Birth: Dec 16, 1924 MRN: [id number] PCP: Colon Branch, MD      Visit Date: 02/16/2020   Universal Protocol:     Consent Given By: the patient  Position: PRONE  Additional Comments: Vital signs were monitored  before and after the procedure. Patient was prepped and draped in the usual sterile fashion. The correct patient, procedure, and site was verified.   Injection Procedure Details:  Procedure Site One Meds Administered:  Meds ordered this encounter  Medications  . methylPREDNISolone acetate (DEPO-MEDROL) injection 40 mg     Laterality: Left  Location/Site:  L4-L5  Needle size: 20 G  Needle type: Tuohy  Needle Placement: Paramedian epidural  Findings:   -Comments: Excellent flow of contrast into the epidural space.  Procedure Details: Using a paramedian approach from the side mentioned above, the region overlying the inferior lamina was localized under fluoroscopic visualization and the soft tissues overlying this structure were infiltrated with 4 ml. of 1% Lidocaine without Epinephrine. The Tuohy needle was inserted into the epidural space using a paramedian approach.   The epidural space was localized using loss of resistance along with lateral and bi-planar fluoroscopic views.  After negative aspirate for air, blood, and CSF, a 2 ml. volume of Isovue-250 was injected into the epidural space and the flow of contrast was observed. Radiographs were obtained for documentation purposes.    The injectate was administered into the level noted above.   Additional Comments:  The patient tolerated the procedure well Dressing: 2 x 2 sterile gauze and Band-Aid    Post-procedure details: Patient was observed during the procedure. Post-procedure instructions were reviewed.  Patient left the clinic in stable condition.  Clinical History: MRI LUMBAR SPINE WITHOUT CONTRAST  TECHNIQUE: Multiplanar, multisequence MR imaging of the lumbar spine was performed. No intravenous contrast was administered.  COMPARISON:  Radiography 09/18/2019  FINDINGS: Segmentation:  5 lumbar type vertebrae  Alignment:  Levoscoliosis and exaggerated lumbar lordosis.  Vertebrae:  No evidence  of fracture or aggressive bone lesion.  Conus medullaris and cauda equina: Conus extends to the L1 level. Conus and cauda equina appear normal.  Paraspinal and other soft tissues: Evidence of treated abdominal aortic aneurysm when correlated with CTA report from 2003.  Disc levels:  T12- L1: Unremarkable.  L1-L2: Spondylosis and disc narrowing with mild bulge. No impingement  L2-L3: Spondylosis with disc narrowing and bulging. Mild facet spurring. Mild spinal stenosis. No foraminal compression.  L3-L4: Advanced disc narrowing with asymmetric right-sided collapse and bulging/ridging. There is degenerative facet spurring on both sides. Moderate to advanced spinal stenosis with biforaminal impingement that is advanced on the right.  L4-L5: Disc narrowing and bulging with endplate spurring. Facet osteoarthritis. Spinal stenosis and bilateral foraminal stenosis is moderate.  L5-S1:Left paracentral to foraminal extrusion migrates superiorly and severely compresses the L5 and descending S1 nerve roots. Facet osteoarthritis on both sides. Disc narrowing and endplate spurring asymmetric to the left.  IMPRESSION: 1. Sagittal T2 weighted imaging was inadvertently not acquired. The available images show a convincing symptomatic finding such that the patient was not automatically recalled, given his limited mobility. If this is needed on clinical grounds the patient can be rescheduled promptly for the missing images. 2. L5-S1 large left foraminal to paracentral extrusion with severe compression of the L5 and S1 nerve roots. 3. There is diffuse degenerative disease with levoscoliosis. 4. Mild-to-moderate spinal stenosis at L2-3 to L4-5, especially affecting the subarticular recesses. 5. Foraminal impingement on the right at L3-4 and bilaterally at L4-5.   Electronically Signed   By: Monte Fantasia M.D.   On: 01/04/2020 11:30     Objective:  VS:  HT:    WT:   BMI:      BP:(!) 153/75  HR:68bpm  TEMP: ( )  RESP:  Physical Exam  Ortho Exam Imaging: No results found.

## 2020-02-25 ENCOUNTER — Encounter (INDEPENDENT_AMBULATORY_CARE_PROVIDER_SITE_OTHER): Payer: Medicare Other | Admitting: Ophthalmology

## 2020-02-25 ENCOUNTER — Other Ambulatory Visit: Payer: Self-pay

## 2020-02-25 DIAGNOSIS — H43813 Vitreous degeneration, bilateral: Secondary | ICD-10-CM | POA: Diagnosis not present

## 2020-02-25 DIAGNOSIS — I10 Essential (primary) hypertension: Secondary | ICD-10-CM

## 2020-02-25 DIAGNOSIS — H353231 Exudative age-related macular degeneration, bilateral, with active choroidal neovascularization: Secondary | ICD-10-CM

## 2020-02-25 DIAGNOSIS — H35033 Hypertensive retinopathy, bilateral: Secondary | ICD-10-CM | POA: Diagnosis not present

## 2020-03-24 ENCOUNTER — Encounter (INDEPENDENT_AMBULATORY_CARE_PROVIDER_SITE_OTHER): Payer: Medicare Other | Admitting: Ophthalmology

## 2020-03-28 ENCOUNTER — Other Ambulatory Visit: Payer: Self-pay

## 2020-03-28 ENCOUNTER — Encounter (INDEPENDENT_AMBULATORY_CARE_PROVIDER_SITE_OTHER): Payer: Medicare Other | Admitting: Ophthalmology

## 2020-03-28 DIAGNOSIS — I10 Essential (primary) hypertension: Secondary | ICD-10-CM

## 2020-03-28 DIAGNOSIS — H35033 Hypertensive retinopathy, bilateral: Secondary | ICD-10-CM

## 2020-03-28 DIAGNOSIS — H353231 Exudative age-related macular degeneration, bilateral, with active choroidal neovascularization: Secondary | ICD-10-CM | POA: Diagnosis not present

## 2020-03-28 DIAGNOSIS — H43813 Vitreous degeneration, bilateral: Secondary | ICD-10-CM | POA: Diagnosis not present

## 2020-04-05 ENCOUNTER — Encounter: Payer: Self-pay | Admitting: Internal Medicine

## 2020-04-05 ENCOUNTER — Telehealth (INDEPENDENT_AMBULATORY_CARE_PROVIDER_SITE_OTHER): Payer: Medicare Other | Admitting: Internal Medicine

## 2020-04-05 VITALS — Ht 68.0 in | Wt 158.0 lb

## 2020-04-05 DIAGNOSIS — R059 Cough, unspecified: Secondary | ICD-10-CM

## 2020-04-05 DIAGNOSIS — R05 Cough: Secondary | ICD-10-CM

## 2020-04-05 MED ORDER — PANTOPRAZOLE SODIUM 40 MG PO TBEC: 40.0000 mg | DELAYED_RELEASE_TABLET | Freq: Every day | ORAL | 3 refills | Status: DC

## 2020-04-05 NOTE — Telephone Encounter (Signed)
Error

## 2020-04-05 NOTE — Progress Notes (Signed)
Pre visit review using our clinic review tool, if applicable. No additional management support is needed unless otherwise documented below in the visit note. 

## 2020-04-05 NOTE — Progress Notes (Signed)
Subjective:    Patient ID: Christopher Hogan, male    DOB: 11-Nov-1925, 86 y.o.   MRN: [id number]  DOS:  04/05/2020 Type of visit - description: Virtual Visit via Telephone  Attempted  to make this a video visit, due to technical difficulties from the patient side it was not possible  thus we proceeded with a Virtual Visit via Telephone    I connected with above mentioned patient  by telephone and verified that I am speaking with the correct person using two identifiers.  THIS ENCOUNTER IS A VIRTUAL VISIT DUE TO COVID-19 - PATIENT WAS NOT SEEN IN THE OFFICE. PATIENT HAS CONSENTED TO VIRTUAL VISIT / TELEMEDICINE VISIT   Location of patient: home  Location of provider: office  I discussed the limitations, risks, security and privacy concerns of performing an evaluation and management service by telephone and the availability of in person appointments. I also discussed with the patient that there may be a patient responsible charge related to this service. The patient expressed understanding and agreed to proceed.  Symptoms started a while back. Is the patient's observation that whenever he eats he started coughing temporarily. No symptoms otherwise.  He did have some postnasal dripping but after he took Mucinex DM and Claritin that got better.  Denies dysphagia per se or odynophagia. He is on a low-dose of PPIs and currently with no classic heartburn. No fever chills No weight loss No chest pain. No nausea, vomiting, diarrhea or blood in the stools.  No abdominal pain.    Review of Systems See above   Past Medical History:  Diagnosis Date  . AAA (abdominal aortic aneurysm) ([place])    repaired 02/2002, renal ultrasound, Sept 2010: normal abdominal aorta, normal renal arteries   . Age-related macular degeneration, wet, left eye ([place])   . Allergic rhinitis   . Arthritis   . At risk for pulmonary fibrosis 06/25/2018  . CAD (coronary artery disease)    stable stress test 7/11  . CHF  (congestive heart failure) ([place])   . Chronic systolic CHF (congestive heart failure) ([place]) 12/17/2013  . Coronary atherosclerosis 06/09/2007   H/O inferior wall MI- PTCA- 1989 Last stress test 2/17- inferior scar, no ischemia    . DEGENERATIVE JOINT DISEASE 03/17/2010   Qualifier: Diagnosis of  By: Dawson Bills    . Dyslipidemia 05/06/2007   LDL 59 Jan 2019     . Eczema 07/09/2013  . ED (erectile dysfunction)   . Epidermoid cyst of skin of chest 09/29/2018  . ERECTILE DYSFUNCTION 12/09/2007   Qualifier: Diagnosis of  By: Larose Kells MD, [place] hypertension 04/30/2007   Qualifier: Diagnosis of  By: Larose Kells MD, [place] GERD 07/21/2008   Qualifier: Diagnosis of  By: Larose Kells MD, [place] GERD (gastroesophageal reflux disease)   . Glaucoma and macular degeneration 12/31/2011  . Hyperlipidemia   . Hypertension   . Inguinal hernia 12/31/2011  . Insomnia 02/22/2018  . Ischemic cardiomyopathy 06/23/2018   Last EF 30-44% by My[facility] Feb 2017  . Left inguinal hernia 06/30/2018  . Myocardial infarction ([place]) 1989   interior wall infarction  . PCP notes >>>>>>>>>>>>>>>>>>>> 07/17/2016  . Pneumonia 2019  . Pre-operative cardiovascular examination 12/31/2011  . Rhinitis 04/20/2008   Qualifier: Diagnosis of  By: Larose Kells MD, [place] Vertigo 06/20/2015    Past Surgical History:  Procedure Laterality Date  . AAA repair  2003  . CARDIAC  CATHETERIZATION    . CHOLECYSTECTOMY    . INGUINAL HERNIA REPAIR Right 1985  . INGUINAL HERNIA REPAIR Left 06/30/2018   open; w/mesh  . INGUINAL HERNIA REPAIR Left 06/30/2018   Procedure: OPEN LEFT INGUINAL HERNIA REPAIR;  Surgeon: Fanny Skates, MD;  Location: [facility];  Service: General;  Laterality: Left;  . INSERTION OF MESH Left 06/30/2018   Procedure: INSERTION OF MESH;  Surgeon: Fanny Skates, MD;  Location: [facility];  Service: General;  Laterality: Left;  [place] MASS EXCISION N/A 09/29/2018   Procedure: EXCISION OF 3 CM MASS ANTERIOR CHEST WALL;  Surgeon: Fanny Skates, MD;  Location: [facility];  Service: General;  Laterality: N/A;  . [place]    Allergies as of 04/05/2020      Reactions   Ace Inhibitors Cough   Aspirin Nausea Only, Other (See Comments)   Full strength only   Spironolactone Rash      Medication List       Accurate as of Apr 05, 2020 11:59 PM. If you have any questions, ask your nurse or doctor.        STOP taking these medications   omeprazole 20 MG capsule Commonly known as: PRILOSEC Stopped by: [name], MD     TAKE these medications   amLODipine 2.5 MG tablet Commonly known as: NORVASC Take 1 tablet (2.5 mg total) by mouth daily.   aspirin 81 MG chewable tablet Chew 81 mg by mouth daily.   atenolol 50 MG tablet Commonly known as: TENORMIN Take 1 tablet (50 mg total) by mouth daily.   azelastine 0.1 % nasal spray Commonly known as: ASTELIN Place 2 sprays into both nostrils 2 (two) times daily. Use in each nostril as directed   dextromethorphan-guaiFENesin 30-600 MG 12hr tablet Commonly known as: MUCINEX DM Take 1 tablet by mouth 2 (two) times daily.   dorzolamidel-timolol 22.3-6.8 MG/ML Soln ophthalmic solution Commonly known as: COSOPT Place 1 drop into both eyes 2 (two) times daily.   fenofibrate 48 MG tablet Commonly known as: TRICOR Take 48 mg by mouth daily.   HYDROcodone-acetaminophen 5-325 MG tablet Commonly known as: Norco 0.5-1 tablet po bid prn pain   ICAPS AREDS FORMULA PO Take 1 capsule by mouth daily.   ipratropium 0.06 % nasal spray Commonly known as: ATROVENT Place 2 sprays into both nostrils 4 (four) times daily.   meclizine 25 MG tablet Commonly known as: Antivert Take 1 tablet (25 mg total) by mouth 2 (two) times daily as needed for dizziness.   MELATONIN PO Take 1 tablet by mouth at bedtime as needed (sleep).   multivitamin capsule Take 1 capsule by mouth daily.   pantoprazole 40 MG tablet Commonly known as: PROTONIX Take 1 tablet (40 mg  total) by mouth daily. Started by: [name], MD   simvastatin 10 MG tablet Commonly known as: ZOCOR Take 1 tablet (10 mg total) by mouth at bedtime.   tamsulosin 0.4 MG Caps capsule Commonly known as: FLOMAX Take 1 capsule (0.4 mg total) by mouth daily after supper.          Objective:   Physical Exam Ht 5\' 8"  (1.727 m)   Wt 158 lb (71.7 kg)   BMI 24.02 kg/m  This is a virtual telephone visit, he is alert oriented x3.  Speaking in complete sentences.  The wife is with him and she provides also very good information.     Assessment       Assessment  HTN Hyperlipidemia GERD Insomnia-- used to use elavil, re start  prn All rhinitis CV: --CAD, MI 1989, stress test (-) 2011 ,lexiscan no ischemia  08-2016  --CHF --AAA, s/p repair 2003, [place] 2010 ok Opht: glaucoma, macular degeneration  [facility]  Pulmonary fibrosis (on clinical grounds, see note from 10/2019)  PLAN: Cough, associated with food intake. Must rule out aspiration. Plan: Change omeprazole to pantoprazole Arrange speech therapy evaluation as an outpatient. Definitely call if symptoms increase, chest pain, fever or chills. Encouraged to eat small bites and chew his food thoroughly.   I discussed the assessment and treatment plan with the patient. The patient was provided an opportunity to ask questions and all were answered. The patient agreed with the plan and demonstrated an understanding of the instructions.   The patient was advised to call back or seek an in-person evaluation if the symptoms worsen or if the condition fails to improve as anticipated.  I provided 22 minutes of non-face-to-face time during this encounter.  [name], MD

## 2020-04-07 NOTE — Assessment & Plan Note (Signed)
Cough, associated with food intake. Must rule out aspiration. Plan: Change omeprazole to pantoprazole Arrange speech therapy evaluation as an outpatient. Definitely call if symptoms increase, chest pain, fever or chills. Encouraged to eat small bites and chew his food thoroughly.

## 2020-04-25 ENCOUNTER — Other Ambulatory Visit: Payer: Self-pay

## 2020-04-25 ENCOUNTER — Encounter (INDEPENDENT_AMBULATORY_CARE_PROVIDER_SITE_OTHER): Payer: Medicare Other | Admitting: Ophthalmology

## 2020-04-25 DIAGNOSIS — H43813 Vitreous degeneration, bilateral: Secondary | ICD-10-CM | POA: Diagnosis not present

## 2020-04-25 DIAGNOSIS — H353231 Exudative age-related macular degeneration, bilateral, with active choroidal neovascularization: Secondary | ICD-10-CM | POA: Diagnosis not present

## 2020-04-25 DIAGNOSIS — H35033 Hypertensive retinopathy, bilateral: Secondary | ICD-10-CM

## 2020-04-25 DIAGNOSIS — I10 Essential (primary) hypertension: Secondary | ICD-10-CM | POA: Diagnosis not present

## 2020-04-27 ENCOUNTER — Ambulatory Visit: Payer: Medicare Other | Admitting: Internal Medicine

## 2020-04-27 ENCOUNTER — Encounter: Payer: Self-pay | Admitting: Internal Medicine

## 2020-04-27 ENCOUNTER — Other Ambulatory Visit: Payer: Self-pay

## 2020-04-27 VITALS — BP 161/70 | HR 64 | Temp 97.2°F | Resp 18 | Ht 68.0 in | Wt 150.0 lb

## 2020-04-27 DIAGNOSIS — R05 Cough: Secondary | ICD-10-CM | POA: Diagnosis not present

## 2020-04-27 DIAGNOSIS — M545 Low back pain, unspecified: Secondary | ICD-10-CM

## 2020-04-27 DIAGNOSIS — I10 Essential (primary) hypertension: Secondary | ICD-10-CM | POA: Diagnosis not present

## 2020-04-27 DIAGNOSIS — T17908D Unspecified foreign body in respiratory tract, part unspecified causing other injury, subsequent encounter: Secondary | ICD-10-CM

## 2020-04-27 DIAGNOSIS — R059 Cough, unspecified: Secondary | ICD-10-CM

## 2020-04-27 LAB — CBC WITH DIFFERENTIAL/PLATELET
Basophils Absolute: 0.1 10*3/uL (ref 0.0–0.1)
Basophils Relative: 1.1 % (ref 0.0–3.0)
Eosinophils Absolute: 0.3 10*3/uL (ref 0.0–0.7)
Eosinophils Relative: 5.1 % — ABNORMAL HIGH (ref 0.0–5.0)
HCT: 36.5 % — ABNORMAL LOW (ref 39.0–52.0)
Hemoglobin: 12.2 g/dL — ABNORMAL LOW (ref 13.0–17.0)
Lymphocytes Relative: 19.3 % (ref 12.0–46.0)
Lymphs Abs: 1 10*3/uL (ref 0.7–4.0)
MCHC: 33.3 g/dL (ref 30.0–36.0)
MCV: 95.8 fl (ref 78.0–100.0)
Monocytes Absolute: 0.6 10*3/uL (ref 0.1–1.0)
Monocytes Relative: 11.5 % (ref 3.0–12.0)
Neutro Abs: 3.4 10*3/uL (ref 1.4–7.7)
Neutrophils Relative %: 63 % (ref 43.0–77.0)
Platelets: 159 10*3/uL (ref 150.0–400.0)
RBC: 3.81 Mil/uL — ABNORMAL LOW (ref 4.22–5.81)
RDW: 14.5 % (ref 11.5–15.5)
WBC: 5.4 10*3/uL (ref 4.0–10.5)

## 2020-04-27 LAB — COMPREHENSIVE METABOLIC PANEL
ALT: 12 U/L (ref 0–53)
AST: 17 U/L (ref 0–37)
Albumin: 4 g/dL (ref 3.5–5.2)
Alkaline Phosphatase: 55 U/L (ref 39–117)
BUN: 14 mg/dL (ref 6–23)
CO2: 30 mEq/L (ref 19–32)
Calcium: 8.8 mg/dL (ref 8.4–10.5)
Chloride: 103 mEq/L (ref 96–112)
Creatinine, Ser: 0.96 mg/dL (ref 0.40–1.50)
GFR: 73.16 mL/min (ref >60.00)
Glucose, Bld: 88 mg/dL (ref 70–99)
Potassium: 4.4 mEq/L (ref 3.5–5.1)
Sodium: 137 mEq/L (ref 135–145)
Total Bilirubin: 0.6 mg/dL (ref 0.2–1.2)
Total Protein: 6 g/dL (ref 6.0–8.3)

## 2020-04-27 NOTE — Patient Instructions (Addendum)
For pain: Okay to take 1 over-the-counter Aleve a day with food if needed You may like to try Tylenol 500 mg: 2 tablets up to 3 times a day You also have a stronger pain medication called hydrocodone, okay to take it sporadically   Continue checking your blood pressure BP GOAL is between 110/65 and  135/85. If it is consistently higher or lower, let me know    GO TO THE LAB : Get the blood work     [place], [place] back for   a checkup in 4 months

## 2020-04-27 NOTE — Progress Notes (Signed)
Pre visit review using our clinic review tool, if applicable. No additional management support is needed unless otherwise documented below in the visit note. 

## 2020-04-27 NOTE — Progress Notes (Signed)
Subjective:    Patient ID: Christopher Hogan, male    DOB: [birth year], 86 y.o.   MRN: [id number]  DOS:  04/27/2020 Type of visit - description: Follow-up Since the last office visit he is doing okay. Still has cough after eating, slightly less frequently?. Continue with postnasal dripping Continue with episodic dizziness the last few seconds when he moves his head. Still having back pain, taking Aleve daily.   Review of Systems Denies fever chills No actual shortness of breath. Currently L UTS minimal.  Past Medical History:  Diagnosis Date  . AAA (abdominal aortic aneurysm) ([place])    repaired 02/2002, renal ultrasound, Sept 2010: normal abdominal aorta, normal renal arteries   . Age-related macular degeneration, wet, left eye ([place])   . Allergic rhinitis   . Arthritis   . At risk for pulmonary fibrosis 06/25/2018  . CAD (coronary artery disease)    stable stress test 7/11  . CHF (congestive heart failure) ([place])   . Chronic systolic CHF (congestive heart failure) ([place]) 12/17/2013  . Coronary atherosclerosis 06/09/2007   H/O inferior wall MI- PTCA- 1989 Last stress test 2/17- inferior scar, no ischemia    . DEGENERATIVE JOINT DISEASE 03/17/2010   Qualifier: Diagnosis of  By: Dawson Bills    . Dyslipidemia 05/06/2007   LDL 59 Jan 2019     . Eczema 07/09/2013  . ED (erectile dysfunction)   . Epidermoid cyst of skin of chest 09/29/2018  . ERECTILE DYSFUNCTION 12/09/2007   Qualifier: Diagnosis of  By: Larose Kells MD, [place] hypertension 04/30/2007   Qualifier: Diagnosis of  By: Larose Kells MD, [place] GERD 07/21/2008   Qualifier: Diagnosis of  By: Larose Kells MD, [place] GERD (gastroesophageal reflux disease)   . Glaucoma and macular degeneration 12/31/2011  . Hyperlipidemia   . Hypertension   . Inguinal hernia 12/31/2011  . Insomnia 02/22/2018  . Ischemic cardiomyopathy 06/23/2018   Last EF 30-44% by Myoview Feb 2017  . Left inguinal hernia 06/30/2018  . Myocardial infarction ([place]) 1989    interior wall infarction  . PCP notes >>>>>>>>>>>>>>>>>>>> 07/17/2016  . Pneumonia 2019  . Pre-operative cardiovascular examination 12/31/2011  . Rhinitis 04/20/2008   Qualifier: Diagnosis of  By: Larose Kells MD, [place] Vertigo 06/20/2015    Past Surgical History:  Procedure Laterality Date  . AAA repair  2003  . CARDIAC CATHETERIZATION    . CHOLECYSTECTOMY    . INGUINAL HERNIA REPAIR Right 1985  . INGUINAL HERNIA REPAIR Left 06/30/2018   open; w/mesh  . INGUINAL HERNIA REPAIR Left 06/30/2018   Procedure: OPEN LEFT INGUINAL HERNIA REPAIR;  Surgeon: Fanny Skates, MD;  Location: [facility];  Service: General;  Laterality: Left;  . INSERTION OF MESH Left 06/30/2018   Procedure: INSERTION OF MESH;  Surgeon: Fanny Skates, MD;  Location: [facility];  Service: General;  Laterality: Left;  Marland Kitchen MASS EXCISION N/A 09/29/2018   Procedure: EXCISION OF 3 CM MASS ANTERIOR CHEST WALL;  Surgeon: Fanny Skates, MD;  Location: [facility];  Service: General;  Laterality: N/A;  . [place]    Allergies as of 04/27/2020      Reactions   Ace Inhibitors Cough   Aspirin Nausea Only, Other (See Comments)   Full strength only   Spironolactone Rash      Medication List       Accurate as of April 27, 2020 10:49 AM. If  you have any questions, ask your nurse or doctor.        STOP taking these medications   dextromethorphan-guaiFENesin 30-600 MG 12hr tablet Commonly known as: MUCINEX DM Stopped by: [name], MD     TAKE these medications   amLODipine 2.5 MG tablet Commonly known as: NORVASC Take 1 tablet (2.5 mg total) by mouth daily.   aspirin 81 MG chewable tablet Chew 81 mg by mouth daily.   atenolol 50 MG tablet Commonly known as: TENORMIN Take 1 tablet (50 mg total) by mouth daily.   azelastine 0.1 % nasal spray Commonly known as: ASTELIN Place 2 sprays into both nostrils 2 (two) times daily. Use in each nostril as directed   dorzolamidel-timolol 22.3-6.8 MG/ML Soln  ophthalmic solution Commonly known as: COSOPT Place 1 drop into both eyes 2 (two) times daily.   fenofibrate 48 MG tablet Commonly known as: TRICOR Take 48 mg by mouth daily.   HYDROcodone-acetaminophen 5-325 MG tablet Commonly known as: Norco 0.5-1 tablet po bid prn pain   ICAPS AREDS FORMULA PO Take 1 capsule by mouth daily.   ipratropium 0.06 % nasal spray Commonly known as: ATROVENT Place 2 sprays into both nostrils 4 (four) times daily.   meclizine 25 MG tablet Commonly known as: Antivert Take 1 tablet (25 mg total) by mouth 2 (two) times daily as needed for dizziness.   MELATONIN PO Take 1 tablet by mouth at bedtime as needed (sleep).   multivitamin capsule Take 1 capsule by mouth daily.   pantoprazole 40 MG tablet Commonly known as: PROTONIX Take 1 tablet (40 mg total) by mouth daily.   simvastatin 10 MG tablet Commonly known as: ZOCOR Take 1 tablet (10 mg total) by mouth at bedtime.   tamsulosin 0.4 MG Caps capsule Commonly known as: FLOMAX Take 1 capsule (0.4 mg total) by mouth daily after supper.          Objective:   Physical Exam BP (!) 161/70 (BP Location: Left Arm, Patient Position: Sitting, Cuff Size: Small)   Pulse 64   Temp (!) 97.2 F (36.2 C) (Temporal)   Resp 18   Ht 5\' 8"  (1.727 m)   Wt 150 lb (68 kg)   SpO2 99%   BMI 22.81 kg/m  General:   Well developed, NAD, BMI noted. HEENT:  Normocephalic . Face symmetric, atraumatic Lungs:  Dry crackles at bases. Normal respiratory effort, no intercostal retractions, no accessory muscle use. Heart: RRR,  no murmur.  Lower extremities: no pretibial edema bilaterally  Skin: Not pale. Not jaundice Neurologic:  alert & oriented X3.  Speech normal, gait appropriate for age and unassisted Psych--  Cognition and judgment appear intact.  Cooperative with normal attention span and concentration.  Behavior appropriate. No anxious or depressed appearing.      Assessment       Assessment HTN Hyperlipidemia GERD Insomnia-- used to use elavil, re start  prn All rhinitis CV: --CAD, MI 1989, stress test (-) 2011 ,lexiscan no ischemia  08-2016  --CHF --AAA, s/p repair 2003, [place] 2010 ok Opht: glaucoma, macular degeneration  [facility]  Pulmonary fibrosis (on clinical grounds, see note from 10/2019)  PLAN: HTN: BP today slightly elevated at home is consistently in the 130s.  No change, continue amlodipine, Tenormin, check a CMP and CBC Cough, associated with food intake: Has not been able to see SP, I called the speech pathology office, they recommend to do first a modified barium swallow.  Refer to  them if needed after the test. Left low back pain: Sxs relatively well controlled, he is taking a single tablet of Aleve OTC.  I recommended Tylenol but he said that does not help as well. We agreed on: Okay to continue Aleve with GI precautions, only 1 tablet daily.  Checking kidney function today. If he likes to, he can try Tylenol, see AVS. Still has few hydrocodones to take for "bad days".RF as needed. RTC 4 months   This visit occurred during the SARS-CoV-2 public health emergency.  Safety protocols were in place, including screening questions prior to the visit, additional usage of staff PPE, and extensive cleaning of exam room while observing appropriate contact time as indicated for disinfecting solutions.

## 2020-04-28 DIAGNOSIS — Z961 Presence of intraocular lens: Secondary | ICD-10-CM | POA: Diagnosis not present

## 2020-04-28 DIAGNOSIS — H401113 Primary open-angle glaucoma, right eye, severe stage: Secondary | ICD-10-CM | POA: Diagnosis not present

## 2020-04-28 DIAGNOSIS — H401121 Primary open-angle glaucoma, left eye, mild stage: Secondary | ICD-10-CM | POA: Diagnosis not present

## 2020-04-28 DIAGNOSIS — H353222 Exudative age-related macular degeneration, left eye, with inactive choroidal neovascularization: Secondary | ICD-10-CM | POA: Diagnosis not present

## 2020-04-28 NOTE — Assessment & Plan Note (Signed)
HTN: BP today slightly elevated at home is consistently in the 130s.  No change, continue amlodipine, Tenormin, check a CMP and CBC Cough, associated with food intake: Has not been able to see SP, I called the speech pathology office, they recommend to do first a modified barium swallow.  Refer to them if needed after the test. Left low back pain: Sxs relatively well controlled, he is taking a single tablet of Aleve OTC.  I recommended Tylenol but he said that does not help as well. We agreed on: Okay to continue Aleve with GI precautions, only 1 tablet daily.  Checking kidney function today. If he likes to, he can try Tylenol, see AVS. Still has few hydrocodones to take for "bad days".RF as needed. RTC 4 months

## 2020-04-28 NOTE — Addendum Note (Signed)
Addended byDamita Dunnings D on: 04/28/2020 03:07 PM   Modules accepted: Orders

## 2020-05-03 ENCOUNTER — Other Ambulatory Visit (HOSPITAL_COMMUNITY): Payer: Self-pay | Admitting: *Deleted

## 2020-05-03 DIAGNOSIS — R1310 Dysphagia, unspecified: Secondary | ICD-10-CM

## 2020-05-13 ENCOUNTER — Ambulatory Visit (HOSPITAL_COMMUNITY)
Admission: RE | Admit: 2020-05-13 | Discharge: 2020-05-13 | Disposition: A | Discharge status: Home / Self Care | Payer: Medicare Other | Source: Ambulatory Visit | Attending: Internal Medicine | Admitting: Internal Medicine

## 2020-05-13 ENCOUNTER — Other Ambulatory Visit: Payer: Self-pay

## 2020-05-13 DIAGNOSIS — T17908D Unspecified foreign body in respiratory tract, part unspecified causing other injury, subsequent encounter: Secondary | ICD-10-CM | POA: Diagnosis not present

## 2020-05-13 DIAGNOSIS — R1310 Dysphagia, unspecified: Secondary | ICD-10-CM | POA: Diagnosis not present

## 2020-05-13 DIAGNOSIS — R05 Cough: Secondary | ICD-10-CM | POA: Insufficient documentation

## 2020-05-13 NOTE — Progress Notes (Deleted)
05/12/20 1400  SLP Visit Information  SLP Received On 05/12/20, late entry on note 05/13/20 - imaging not yet available in chart, will add second note in imaging documentation when/if available.   General Information  HPI 86 y.o. male with Chronic cough,  per MD "no sinus symptoms though certainly may be attributable to postnasal drip, previously on PPI for GERD, not really experiencing GERD symptoms but could certainly try PPI for potential subclinical acid reflux." MBS ordered. 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   Type of Study MBS-Modified Barium Swallow Study  Previous Swallow Assessment NOne  Diet Prior to this Study Regular;Thin liquids  Temperature Spikes Noted No  Respiratory Status Room air  History of Recent Intubation No  Behavior/Cognition Alert;Cooperative;Pleasant mood  Oral Cavity Assessment WFL  Oral Care Completed by SLP No  Oral Cavity - Dentition Adequate natural dentition  Vision Functional for self feeding  Self-Feeding Abilities Able to feed self  Patient Positioning Upright in chair  Baseline Vocal Quality Normal  Volitional Cough Strong  Volitional Swallow Able to elicit  Anatomy [facility]  Pharyngeal Secretions Not observed secondary MBS  Oral Motor/Sensory Function  Overall Oral Motor/Sensory Function WFL  Oral Preparation/Oral Phase  Oral Phase WFL  Pharyngeal Phase  Pharyngeal Phase Impaired  Pharyngeal - Nectar  Pharyngeal- Nectar Cup Reduced pharyngeal peristalsis;Reduced epiglottic inversion;Reduced anterior laryngeal mobility;Reduced laryngeal elevation;Reduced airway/laryngeal closure;Reduced tongue base retraction;Penetration/Aspiration during swallow;Penetration/Apiration after swallow;Trace aspiration;Pharyngeal residue - valleculae;Pharyngeal residue -  pyriform  Pharyngeal Material enters airway, CONTACTS cords and not ejected out  Pharyngeal - Thin  Pharyngeal- Thin Cup Reduced pharyngeal peristalsis;Reduced epiglottic inversion;Reduced anterior laryngeal mobility;Reduced laryngeal elevation;Reduced airway/laryngeal closure;Reduced tongue base retraction;Penetration/Aspiration during swallow;Penetration/Apiration after swallow;Trace aspiration;Pharyngeal residue - valleculae;Pharyngeal residue - pyriform  Pharyngeal- Thin Straw Reduced pharyngeal peristalsis;Reduced epiglottic inversion;Reduced anterior laryngeal mobility;Reduced laryngeal elevation;Reduced airway/laryngeal closure;Reduced tongue base retraction;Penetration/Aspiration during swallow;Penetration/Apiration after swallow;Trace aspiration;Pharyngeal residue - valleculae;Pharyngeal residue - pyriform  Pharyngeal Material enters airway, passes BELOW cords without attempt by patient to eject out (silent aspiration);Material enters airway, CONTACTS cords and not ejected out  Pharyngeal Material enters airway, passes BELOW cords without attempt by patient to eject out (silent aspiration);Material enters airway, CONTACTS cords and not ejected out  Pharyngeal - Solids  Pharyngeal- Puree Reduced pharyngeal peristalsis;Reduced epiglottic inversion;Reduced anterior laryngeal mobility;Reduced laryngeal elevation;Reduced airway/laryngeal closure;Reduced tongue base retraction;Pharyngeal residue - valleculae;Pharyngeal residue - pyriform  Pharyngeal- Regular Reduced pharyngeal peristalsis;Reduced epiglottic inversion;Reduced anterior laryngeal mobility;Reduced laryngeal elevation;Reduced airway/laryngeal closure;Reduced tongue base retraction;Pharyngeal residue - valleculae;Pharyngeal residue - pyriform  Pharyngeal Material does not enter airway  Clinical Impression  Clinical Impression Pt demonstrates a moderate to severe oropahryngeal dyspahgia with silent aspiration of liquids during the swallow.  Pt had decreased base of tongue retraction, epiglottic deflection and mobility of hyolaryngeal mechanism, causing a slight decreased in laryngeal closure and moderate vallecular and pyriform residue. Increasing viscosity with nectar thick liquids increased residue. A chin tuck with thin was trials with promising result. The pt increased laryngeal closure and decreased residue, though there was still silent trace frank penetrate events. A f/u throat clear and second swallow ejected penetrate. The pt was advised verbally and with written instruction to tuck chin, swallow hard, clear throat and swallow again. He is also advised to follow solids with liquids to clear residue. Given that pt  has been tolerating silent aspiration without respiratory infection is a good sign for tolerance. Family not present in room during study, but wife was apparently in the waiting room. Will f/u with a call to wife to reinforce recommendations and suggestion to f/u with home health SLP for strengthening exercises.   SLP Visit Diagnosis Dysphagia, oropharyngeal phase (R13.12)  Impact on safety and function Severe aspiration risk  Swallow Evaluation Recommendations  SLP Diet Recommendations Regular solids;Thin liquid  Liquid Administration via Cup;Straw  Medication Administration Whole meds with puree  Supervision Patient able to self feed  Compensations Clear throat after each swallow;Follow solids with liquid;Multiple dry swallows after each bite/sip;Chin tuck;Effortful swallow  Postural Changes Seated upright at 90 degrees  Treatment Plan  Oral Care Recommendations Oral care BID  Treatment Recommendations Defer treatment plan to f/u with SLP  Follow up Recommendations Home health SLP  Individuals Consulted  Consulted and Agree with Results and Recommendations Patient;Family member/caregiver  Family Member Consulted wife, the next day  Progression Toward Goals  Progression toward goals Progressing toward goals  SLP  Time Calculation  SLP Start Time (ACUTE ONLY) 1110  SLP Stop Time (ACUTE ONLY) 1127  SLP Time Calculation (min) (ACUTE ONLY) 17 min  SLP Evaluations  $ SLP Speech Visit 1 Visit  SLP Evaluations  $MBS Swallow 1 Procedure  $Swallowing Treatment 1 Procedure

## 2020-05-13 NOTE — Progress Notes (Signed)
Modified Barium Swallow Progress Note  Patient Details  Name: NAIN RUDD MRN: [id number] Date of Birth: May 17, 1925  Today's Date: 05/13/2020  Modified Barium Swallow completed.  Full report located under Chart Review in the Imaging Section.  Brief recommendations include the following:  Clinical Impression  Pt demonstrates a mild delay in swallow initiation resulting in one instance of silent aspiration before the swallow thin liquids with a large consecutive bolus. Trials a chin tuck, which in trials did not result in any aspiration. Pt also performed better with single cup sips. Additionally, pt found to have a small Zenker's diverticulum that did not impact the swallow. Pt recommended to continue current home diet, but attempt a chin tuck to reduce aspiration events. Small sips also beneficial if a chin tuck is not possible, though pt appears agreeable. Discussed with wife. They may f/u with OP SLP if problem persists.    Swallow Evaluation Recommendations       SLP Diet Recommendations: Regular solids;Thin liquid   Liquid Administration via: Cup;Straw   Medication Administration: Whole meds with liquid   Supervision: Patient able to self feed   Compensations: Slow rate;Small sips/bites                Annalicia Renfrew, Katherene Ponto 05/13/2020,2:03 PM

## 2020-05-26 ENCOUNTER — Encounter (INDEPENDENT_AMBULATORY_CARE_PROVIDER_SITE_OTHER): Payer: Self-pay | Admitting: Ophthalmology

## 2020-06-03 ENCOUNTER — Encounter (INDEPENDENT_AMBULATORY_CARE_PROVIDER_SITE_OTHER): Payer: Medicare Other | Admitting: Ophthalmology

## 2020-06-03 ENCOUNTER — Other Ambulatory Visit: Payer: Self-pay

## 2020-06-03 DIAGNOSIS — H43813 Vitreous degeneration, bilateral: Secondary | ICD-10-CM | POA: Diagnosis not present

## 2020-06-03 DIAGNOSIS — I10 Essential (primary) hypertension: Secondary | ICD-10-CM

## 2020-06-03 DIAGNOSIS — H353231 Exudative age-related macular degeneration, bilateral, with active choroidal neovascularization: Secondary | ICD-10-CM | POA: Diagnosis not present

## 2020-06-03 DIAGNOSIS — H35033 Hypertensive retinopathy, bilateral: Secondary | ICD-10-CM | POA: Diagnosis not present

## 2020-06-07 ENCOUNTER — Telehealth: Payer: Self-pay | Admitting: Internal Medicine

## 2020-06-07 MED ORDER — HYDROCODONE-HOMATROPINE 5-1.5 MG/5ML PO SYRP: 5.0000 mL | ORAL_SOLUTION | Freq: Two times a day (BID) | ORAL | 0 refills | Status: DC | PRN

## 2020-06-07 NOTE — Telephone Encounter (Signed)
Spoke w/ Christopher Hogan- informed of recommendations. States they have moved to [place]- wanted to know if they are doing covid testing closer to their home, informed she can [facility] or [facility] but I believe they require scheduling online, which she states they don't have a computer, number for COVID-19 scheduling at [facility] given. Informed if he is having any of the listed symptoms below to take Pt to urgent care we can not see him in office w/ his symptoms. Also informed that Hycodan syrup [facility] [place]- can maybe have covid testing when they go pick up prescription. I informed her to watch for excessive sedation. Christopher Hogan verbalized understanding.

## 2020-06-07 NOTE — Telephone Encounter (Signed)
Please advise 

## 2020-06-07 NOTE — Telephone Encounter (Signed)
Advise patient's wife: -Please check for Covid -If he has fever, chills, chest pain, difficulty breathing, myalgias, nausea or vomiting, if he is not gradually better: Needs to be seen at the urgent care or ER. -I am sending a small amount of a cough suppressant, hydrocodone, may cause drowsiness, this is only a "Band-Aid" for the cough.

## 2020-06-07 NOTE — Telephone Encounter (Signed)
Per patient's spouse Patient had a terrible cough, patient was unable to sleep last night.Patient taking robitussin no relief.    Patient offered a vitural appointment, patient unable to do a vitural appointment. No access to smart phone or computer.

## 2020-06-08 ENCOUNTER — Ambulatory Visit: Payer: Medicare Other | Attending: Internal Medicine

## 2020-06-08 DIAGNOSIS — Z20822 Contact with and (suspected) exposure to covid-19: Secondary | ICD-10-CM

## 2020-06-09 LAB — SARS-COV-2, NAA 2 DAY TAT

## 2020-06-09 LAB — NOVEL CORONAVIRUS, NAA: SARS-CoV-2, NAA: NOT DETECTED

## 2020-06-10 ENCOUNTER — Telehealth: Payer: Self-pay | Admitting: Internal Medicine

## 2020-06-10 NOTE — Telephone Encounter (Signed)
Caller : Christopher Hogan  Call Back # [phone]  Patient states spouse's covid test come back negative.

## 2020-06-10 NOTE — Telephone Encounter (Signed)
Just an FYI

## 2020-06-13 ENCOUNTER — Encounter: Payer: Self-pay | Admitting: Internal Medicine

## 2020-06-13 ENCOUNTER — Telehealth: Payer: Self-pay

## 2020-06-13 ENCOUNTER — Ambulatory Visit: Payer: Medicare Other | Admitting: Internal Medicine

## 2020-06-13 ENCOUNTER — Other Ambulatory Visit: Payer: Self-pay

## 2020-06-13 VITALS — BP 152/83 | HR 62 | Temp 98.3°F | Resp 18 | Ht 68.0 in | Wt 152.2 lb

## 2020-06-13 DIAGNOSIS — J40 Bronchitis, not specified as acute or chronic: Secondary | ICD-10-CM

## 2020-06-13 MED ORDER — AZITHROMYCIN 250 MG PO TABS
ORAL_TABLET | ORAL | 0 refills | Status: DC
Start: 2020-06-13 — End: 2020-08-31

## 2020-06-13 MED ORDER — ALBUTEROL SULFATE HFA 108 (90 BASE) MCG/ACT IN AERS
2.0000 | INHALATION_SPRAY | Freq: Four times a day (QID) | RESPIRATORY_TRACT | 1 refills | Status: AC | PRN
Start: 2020-06-13 — End: ?

## 2020-06-13 NOTE — Telephone Encounter (Signed)
Pt began coughing again last night, through the night.  Pt had not done that in the past 4 nights.  Still has right much congestion.  Pt's Spouse is wondering if you want pt to come in to be seen to find out what is going on with him now that he has been tested and is Covid negative.  Coughing up phlegm this morning.  Please advise.  Pt's spouse is also wanting to [facility] [address], Store # [phone].

## 2020-06-13 NOTE — Progress Notes (Signed)
Pre visit review using our clinic review tool, if applicable. No additional management support is needed unless otherwise documented below in the visit note. 

## 2020-06-13 NOTE — Telephone Encounter (Signed)
Pharmacy updated. Okay to schedule in person visit. 40 mins please.

## 2020-06-13 NOTE — Patient Instructions (Addendum)
Take the antibiotic Zithromax as prescribed  For cough.  Robitussin-DM over-the-counter, follow the instructions in the package.  You also can continue taking the syrup I sent  (hydrocodone)  at night  Also use albuterol: 2 puffs up to 4 times a day as needed for persisting cough, wheezing or chest congestion  Drink plenty fluids  Call if not gradually better in the next 3 to 4 days  Call or go to the ER if your symptoms get worse

## 2020-06-13 NOTE — Progress Notes (Signed)
Subjective:    Patient ID: Christopher Hogan, male    DOB: [birth year], 86 y.o.   MRN: [id number]  DOS:  06/13/2020 Type of visit - description: Acute Patient called  06/07/2020 c/o cough, he was advised to be checked for Covid, test came back negative. He continue with cough,I sent  Hydrocodone; initially it helped but the cough has increased and last night he coughed frequently.  No fever chills Mild sputum production Has noted some wheezing Denies any sinus congestion or pain No chest pain + DOE, it was worse last week than today. No nausea, vomiting, diarrhea.  No myalgias He has been quite fatigued for a while, worse in the last few days. Appetite remains normal.   Review of Systems See above   Past Medical History:  Diagnosis Date  . AAA (abdominal aortic aneurysm) ([place])    repaired 02/2002, renal ultrasound, Sept 2010: normal abdominal aorta, normal renal arteries   . Age-related macular degeneration, wet, left eye ([place])   . Allergic rhinitis   . Arthritis   . At risk for pulmonary fibrosis 06/25/2018  . CAD (coronary artery disease)    stable stress test 7/11  . CHF (congestive heart failure) ([place])   . Chronic systolic CHF (congestive heart failure) ([place]) 12/17/2013  . Coronary atherosclerosis 06/09/2007   H/O inferior wall MI- PTCA- 1989 Last stress test 2/17- inferior scar, no ischemia    . DEGENERATIVE JOINT DISEASE 03/17/2010   Qualifier: Diagnosis of  By: Dawson Bills    . Dyslipidemia 05/06/2007   LDL 59 Jan 2019     . Eczema 07/09/2013  . ED (erectile dysfunction)   . Epidermoid cyst of skin of chest 09/29/2018  . ERECTILE DYSFUNCTION 12/09/2007   Qualifier: Diagnosis of  By: Larose Kells MD, [place] hypertension 04/30/2007   Qualifier: Diagnosis of  By: Larose Kells MD, [place] GERD 07/21/2008   Qualifier: Diagnosis of  By: Larose Kells MD, [place] GERD (gastroesophageal reflux disease)   . Glaucoma and macular degeneration 12/31/2011  . Hyperlipidemia   .  Hypertension   . Inguinal hernia 12/31/2011  . Insomnia 02/22/2018  . Ischemic cardiomyopathy 06/23/2018   Last EF 30-44% by Myoview Feb 2017  . Left inguinal hernia 06/30/2018  . Myocardial infarction ([place]) 1989   interior wall infarction  . PCP notes >>>>>>>>>>>>>>>>>>>> 07/17/2016  . Pneumonia 2019  . Pre-operative cardiovascular examination 12/31/2011  . Rhinitis 04/20/2008   Qualifier: Diagnosis of  By: Larose Kells MD, [place] Vertigo 06/20/2015    Past Surgical History:  Procedure Laterality Date  . AAA repair  2003  . CARDIAC CATHETERIZATION    . CHOLECYSTECTOMY    . INGUINAL HERNIA REPAIR Right 1985  . INGUINAL HERNIA REPAIR Left 06/30/2018   open; w/mesh  . INGUINAL HERNIA REPAIR Left 06/30/2018   Procedure: OPEN LEFT INGUINAL HERNIA REPAIR;  Surgeon: Fanny Skates, MD;  Location: [place] [facility];  Service: General;  Laterality: Left;  . INSERTION OF MESH Left 06/30/2018   Procedure: INSERTION OF MESH;  Surgeon: Fanny Skates, MD;  Location: [facility];  Service: General;  Laterality: Left;  [place] MASS EXCISION N/A 09/29/2018   Procedure: EXCISION OF 3 CM MASS ANTERIOR CHEST WALL;  Surgeon: Fanny Skates, MD;  Location: [facility];  Service: General;  Laterality: N/A;  . [place] as of 06/13/2020      Reactions  Ace Inhibitors Cough   Aspirin Nausea Only, Other (See Comments)   Full strength only   Spironolactone Rash      Medication List       Accurate as of June 13, 2020 11:59 PM. If you have any questions, ask your nurse or doctor.        albuterol 108 (90 Base) MCG/ACT inhaler Commonly known as: VENTOLIN HFA Inhale 2 puffs into the lungs every 6 (six) hours as needed for wheezing or shortness of breath. Started by: [name], MD   amLODipine 2.5 MG tablet Commonly known as: NORVASC Take 1 tablet (2.5 mg total) by mouth daily.   aspirin 81 MG chewable tablet Chew 81 mg by mouth daily.   atenolol 50 MG tablet Commonly known as:  TENORMIN Take 1 tablet (50 mg total) by mouth daily.   azelastine 0.1 % nasal spray Commonly known as: ASTELIN Place 2 sprays into both nostrils 2 (two) times daily. Use in each nostril as directed   azithromycin 250 MG tablet Commonly known as: Zithromax Z-Pak 2 tabs a day the first day, then 1 tab a day x 4 days Started by: [name], MD   dorzolamidel-timolol 22.3-6.8 MG/ML Soln ophthalmic solution Commonly known as: COSOPT Place 1 drop into both eyes 2 (two) times daily.   fenofibrate 48 MG tablet Commonly known as: TRICOR Take 48 mg by mouth daily.   HYDROcodone-acetaminophen 5-325 MG tablet Commonly known as: Norco 0.5-1 tablet po bid prn pain   HYDROcodone-homatropine 5-1.5 MG/5ML syrup Commonly known as: HYCODAN Take 5 mLs by mouth 2 (two) times daily as needed for cough.   ICAPS AREDS FORMULA PO Take 1 capsule by mouth daily.   ipratropium 0.06 % nasal spray Commonly known as: ATROVENT Place 2 sprays into both nostrils 4 (four) times daily.   meclizine 25 MG tablet Commonly known as: Antivert Take 1 tablet (25 mg total) by mouth 2 (two) times daily as needed for dizziness.   MELATONIN PO Take 1 tablet by mouth at bedtime as needed (sleep).   multivitamin capsule Take 1 capsule by mouth daily.   pantoprazole 40 MG tablet Commonly known as: PROTONIX Take 1 tablet (40 mg total) by mouth daily.   simvastatin 10 MG tablet Commonly known as: ZOCOR Take 1 tablet (10 mg total) by mouth at bedtime.   tamsulosin 0.4 MG Caps capsule Commonly known as: FLOMAX Take 1 capsule (0.4 mg total) by mouth daily after supper.          Objective:   Physical Exam BP (!) 152/83 (BP Location: Left Arm, Patient Position: Sitting, Cuff Size: Small)   Pulse 62   Temp 98.3 F (36.8 C) (Oral)   Resp 18   Ht 5\' 8"  (1.727 m)   Wt 152 lb 4 oz (69.1 kg)   SpO2 98%   BMI 23.15 kg/m  General:   Well developed, NAD, BMI noted. HEENT:  Normocephalic . Face symmetric,  atraumatic. Nose: Not congested.  Sinuses no TTP Lungs:  Bilateral dry crackles, few rhonchi, no wheezing Normal respiratory effort, no intercostal retractions, no accessory muscle use. Heart: RRR,  no murmur.  Lower extremities: no pretibial edema bilaterally  Skin: Not pale. Not jaundice Neurologic:  alert & oriented X3.  Speech normal, gait appropriate for age and unassisted Psych--  Cognition and judgment appear intact.  Cooperative with normal attention span and concentration.  Behavior appropriate. No anxious or depressed appearing.      Assessment  Assessment HTN Hyperlipidemia GERD Insomnia-- used to use elavil, re start  prn All rhinitis CV: --CAD, MI 1989, stress test (-) 2011 ,lexiscan no ischemia  08-2016  --CHF --AAA, s/p repair 2003, [place] 2010 ok Opht: glaucoma, macular degeneration  [facility]  Pulmonary fibrosis (on clinical grounds, see note from 10/2019)  PLAN: Bronchitis Suspect bronchitis on clinical grounds, in the context of pulmonary fibrosis. Recent Covid testing negative Plan: Zithromax, Robitussin, continue hydrocodone only at night (patient states not getting excessively sleepy). Reports wheezing, at some point in the past when he had pneumonia he use inhalers, prescribed albuterol, how to use it discussed. Call if not gradually better, call or ER if symptoms worsen.    This visit occurred during the SARS-CoV-2 public health emergency.  Safety protocols were in place, including screening questions prior to the visit, additional usage of staff PPE, and extensive cleaning of exam room while observing appropriate contact time as indicated for disinfecting solutions.

## 2020-06-14 NOTE — Assessment & Plan Note (Signed)
Bronchitis Suspect bronchitis on clinical grounds, in the context of pulmonary fibrosis. Recent Covid testing negative Plan: Zithromax, Robitussin, continue hydrocodone only at night (patient states not getting excessively sleepy). Reports wheezing, at some point in the past when he had pneumonia he use inhalers, prescribed albuterol, how to use it discussed. Call if not gradually better, call or ER if symptoms worsen.

## 2020-06-27 ENCOUNTER — Telehealth: Payer: Self-pay | Admitting: Physical Medicine and Rehabilitation

## 2020-06-27 NOTE — Telephone Encounter (Signed)
Please have him obtain through Dr. Larose Kells, PCP

## 2020-06-27 NOTE — Telephone Encounter (Signed)
Called patient's wife to advise.

## 2020-06-27 NOTE — Telephone Encounter (Signed)
Patient's wife [name] called advised patient need Rx refilled Hydrocodone. [facility] [id number] [facility] [place]   PH# [phone] The number to contact [name] is [phone]

## 2020-06-27 NOTE — Telephone Encounter (Signed)
Please advise 

## 2020-06-28 ENCOUNTER — Telehealth: Payer: Self-pay | Admitting: Internal Medicine

## 2020-06-28 NOTE — Telephone Encounter (Signed)
Patient's spouse calling in reference to medications patient is taking.   Patient's spouse would like a call back .

## 2020-06-29 NOTE — Telephone Encounter (Signed)
Left message on machine to call back  

## 2020-06-30 MED ORDER — HYDROCODONE-ACETAMINOPHEN 5-325 MG PO TABS: ORAL_TABLET | ORAL | 0 refills | Status: DC

## 2020-06-30 NOTE — Telephone Encounter (Signed)
Advise patient's wife I sent a prescription

## 2020-06-30 NOTE — Telephone Encounter (Signed)
Patient wife wanted to know if you could refill his hydrocodone/apap.  They called ortho and they told them to call you.  She states that pt only sparingly.

## 2020-06-30 NOTE — Telephone Encounter (Signed)
Patient wife notified.

## 2020-06-30 NOTE — Telephone Encounter (Signed)
Please call patient back

## 2020-07-01 ENCOUNTER — Encounter (INDEPENDENT_AMBULATORY_CARE_PROVIDER_SITE_OTHER): Payer: Medicare Other | Admitting: Ophthalmology

## 2020-07-01 ENCOUNTER — Other Ambulatory Visit: Payer: Self-pay

## 2020-07-01 DIAGNOSIS — H43813 Vitreous degeneration, bilateral: Secondary | ICD-10-CM | POA: Diagnosis not present

## 2020-07-01 DIAGNOSIS — H353231 Exudative age-related macular degeneration, bilateral, with active choroidal neovascularization: Secondary | ICD-10-CM

## 2020-07-01 DIAGNOSIS — H35033 Hypertensive retinopathy, bilateral: Secondary | ICD-10-CM

## 2020-07-01 DIAGNOSIS — I10 Essential (primary) hypertension: Secondary | ICD-10-CM | POA: Diagnosis not present

## 2020-07-22 ENCOUNTER — Other Ambulatory Visit: Payer: Self-pay | Admitting: Internal Medicine

## 2020-07-29 ENCOUNTER — Encounter (INDEPENDENT_AMBULATORY_CARE_PROVIDER_SITE_OTHER): Payer: Medicare Other | Admitting: Ophthalmology

## 2020-07-29 ENCOUNTER — Other Ambulatory Visit: Payer: Self-pay

## 2020-07-29 DIAGNOSIS — I10 Essential (primary) hypertension: Secondary | ICD-10-CM | POA: Diagnosis not present

## 2020-07-29 DIAGNOSIS — H43813 Vitreous degeneration, bilateral: Secondary | ICD-10-CM | POA: Diagnosis not present

## 2020-07-29 DIAGNOSIS — H35033 Hypertensive retinopathy, bilateral: Secondary | ICD-10-CM | POA: Diagnosis not present

## 2020-07-29 DIAGNOSIS — H353231 Exudative age-related macular degeneration, bilateral, with active choroidal neovascularization: Secondary | ICD-10-CM | POA: Diagnosis not present

## 2020-08-10 ENCOUNTER — Telehealth: Payer: Self-pay | Admitting: Internal Medicine

## 2020-08-10 NOTE — Telephone Encounter (Signed)
fluticasone propionate spray  Patient's spouse states he wonders if the medication above he is allergic to? Patient states he can not remember the name of the nasal spray .   Please advise

## 2020-08-10 NOTE — Telephone Encounter (Signed)
Spoke w/ [name]- informed I didn't see on allergy list that he is allergic to this medication. Thelma verbalized understanding.

## 2020-08-17 IMAGING — MR MR LUMBAR SPINE W/O CM
4 series · 45 of 48 positions shown · non-contrast
Comparison: Radiography 09/18/2019

CLINICAL DATA: Low back pain for over 6 weeks on the left side
radiating into the leg.

EXAM:
MRI LUMBAR SPINE WITHOUT CONTRAST
TECHNIQUE: Multiplanar, multisequence MR imaging of the lumbar spine was
performed. No intravenous contrast was administered.

[Series 3: tirm sag · sagittal · 4.0mm · 0.55mm/px · 7 of 17 slices shown]
[im 1/17]
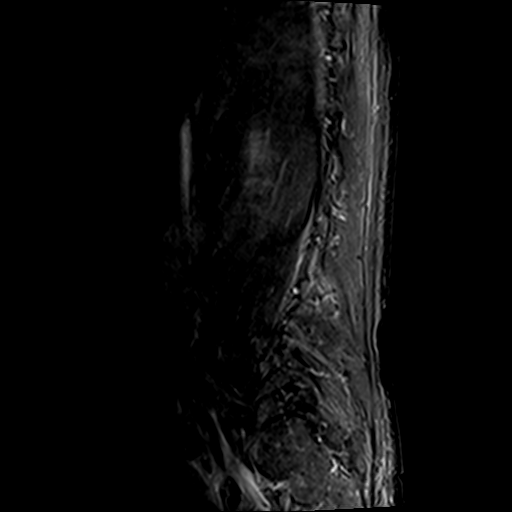
[im 3/17]
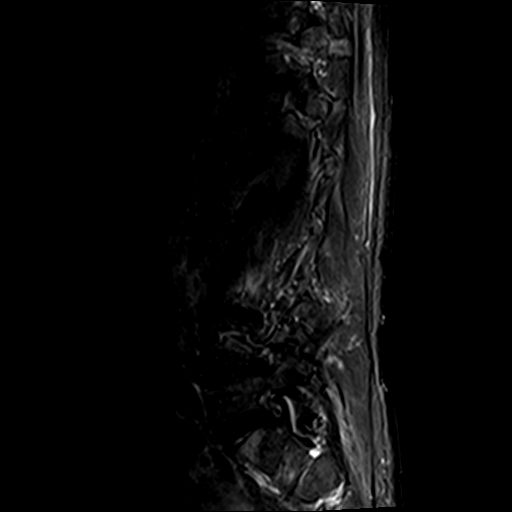
[im 6/17]
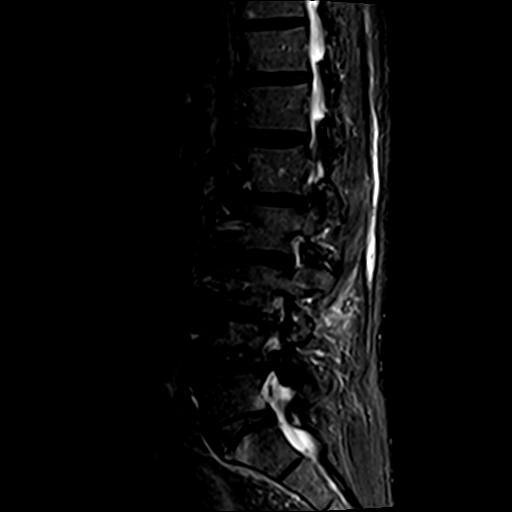
[im 9/17]
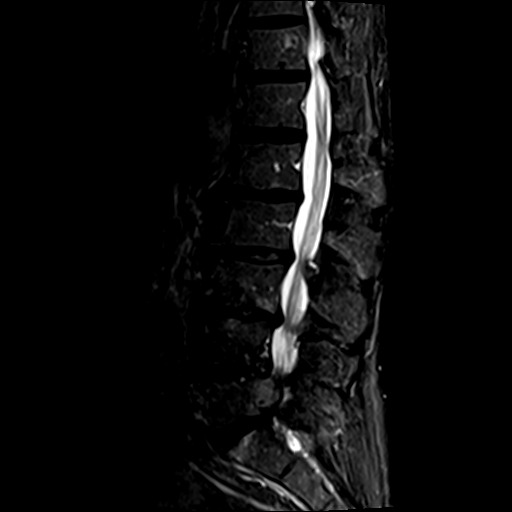
[im 11/17]
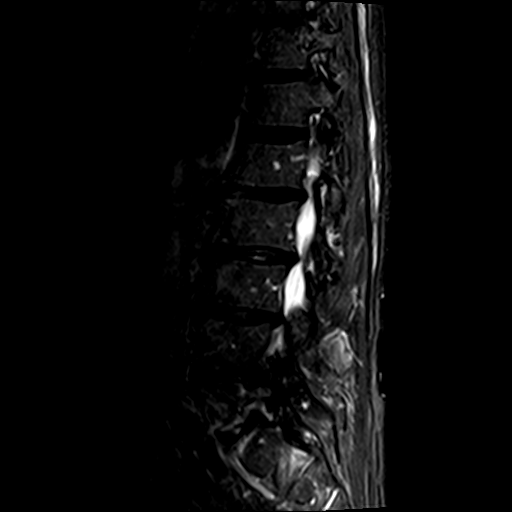
[im 14/17]
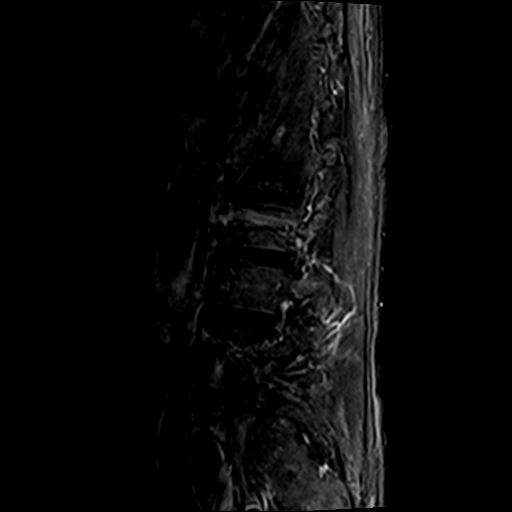
[im 17/17]
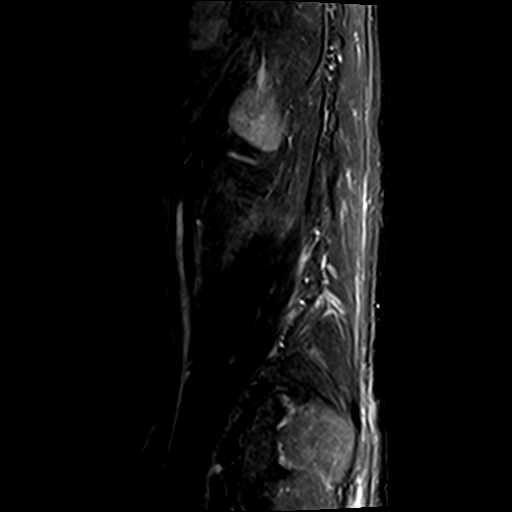

[Series 4: T1 · sagittal · 4.0mm · 1.09mm/px · 7 of 17 slices shown (1 of 2)]
[im 1/17]
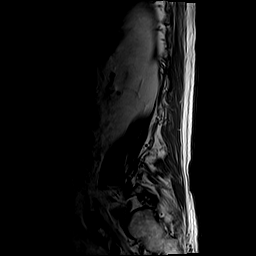
[im 3/17]
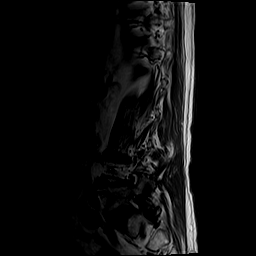
[im 6/17]
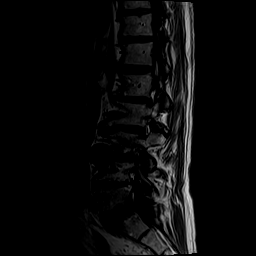
[im 9/17]
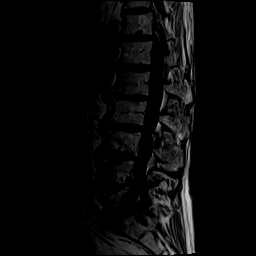
[im 11/17]
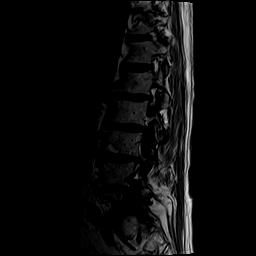
[im 14/17]
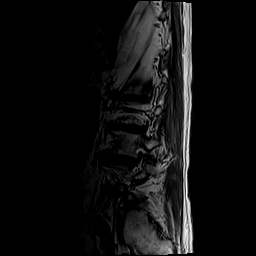
[im 17/17]
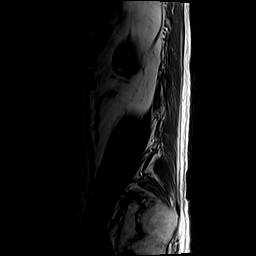

[Series 5: T1 · axial · 4.0mm · 0.78mm/px · z∈[-83,+92]mm · 14 of 41 slices shown (2 of 2)]
[im 1/41]
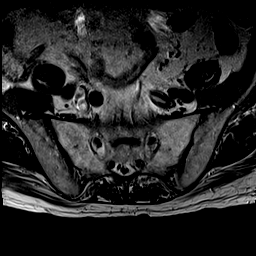
[im 3/41]
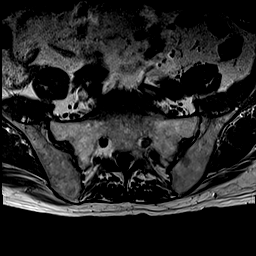
[im 6/41]
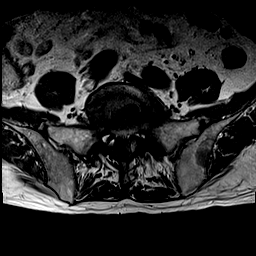
[im 8/41]
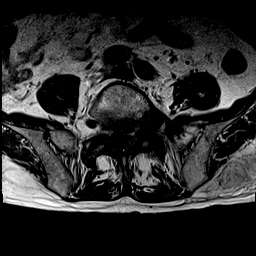
[im 11/41]
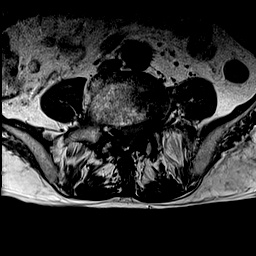
[im 13/41]
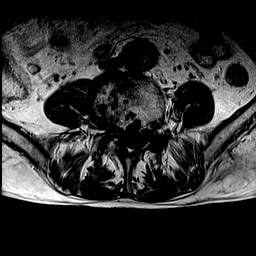
[im 16/41]
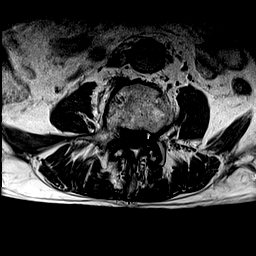
[im 18/41]
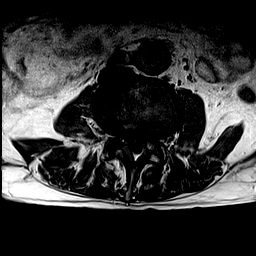
[im 21/41]
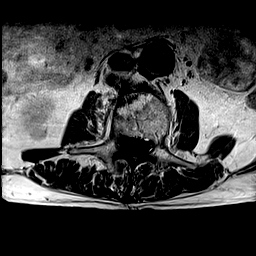
[im 23/41]
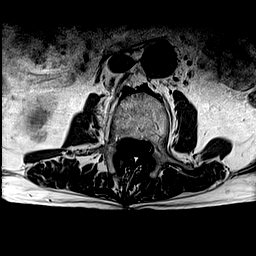
[im 28/41]
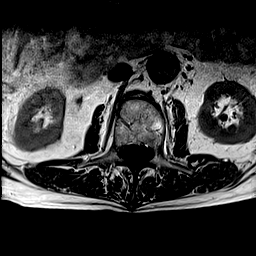
[im 33/41]
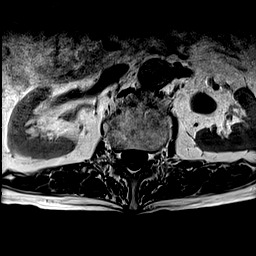
[im 36/41]
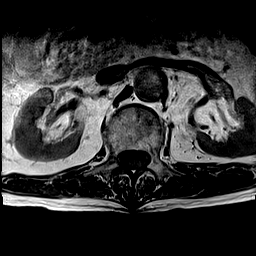
[im 38/41]
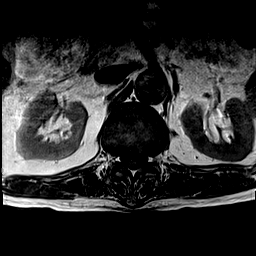

[Series 6: T2 · axial · 4.0mm · 0.78mm/px · z∈[-83,+137]mm · 17 of 41 slices shown]
[im 1/41]
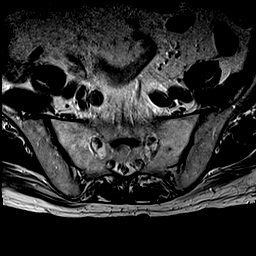
[im 3/41]
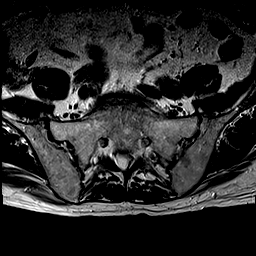
[im 6/41]
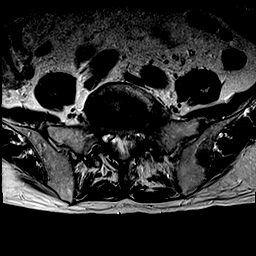
[im 8/41]
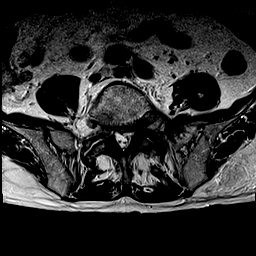
[im 11/41]
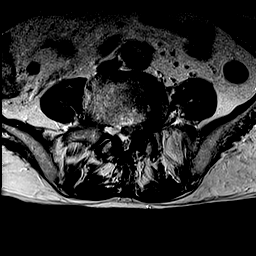
[im 13/41]
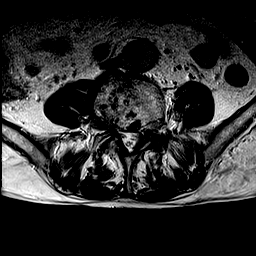
[im 16/41]
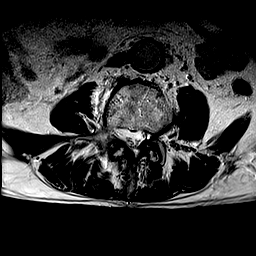
[im 18/41]
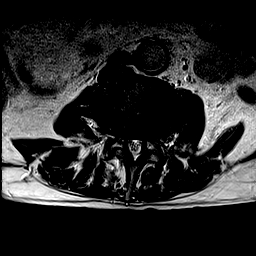
[im 21/41]
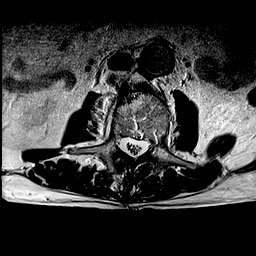
[im 23/41]
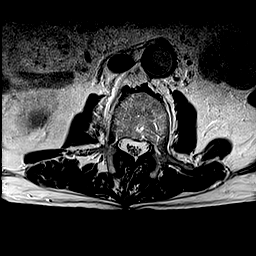
[im 26/41]
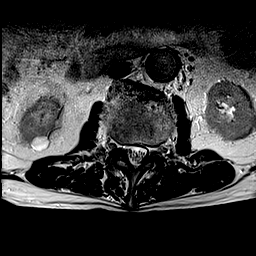
[im 28/41]
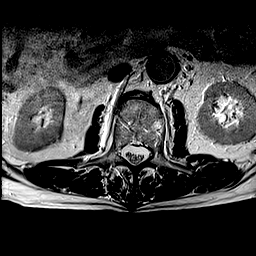
[im 31/41]
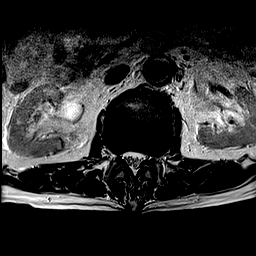
[im 33/41]
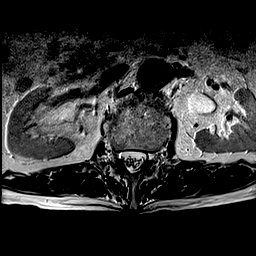
[im 36/41]
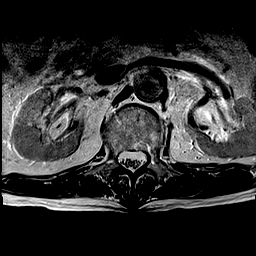
[im 38/41]
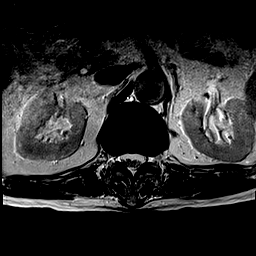
[im 41/41]
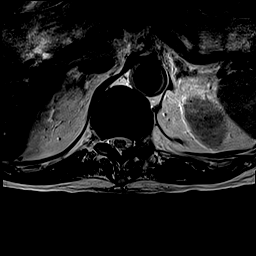

[45 of 48 positions shown; findings below may reference images not displayed]

FINDINGS: Segmentation:  5 lumbar type vertebrae

Alignment:  Levoscoliosis and exaggerated lumbar lordosis.

Vertebrae:  No evidence of fracture or aggressive bone lesion.

Conus medullaris and cauda equina: Conus extends to the L1 level.
Conus and cauda equina appear normal.

Paraspinal and other soft tissues: Evidence of treated abdominal
aortic aneurysm when correlated with CTA report from 1112.

Disc levels:

T12- L1: Unremarkable.

L1-L2: Spondylosis and disc narrowing with mild bulge. No
impingement

L2-L3: Spondylosis with disc narrowing and bulging. Mild facet
spurring. Mild spinal stenosis. No foraminal compression.

L3-L4: Advanced disc narrowing with asymmetric right-sided collapse
and bulging/ridging. There is degenerative facet spurring on both
sides. Moderate to advanced spinal stenosis with biforaminal
impingement that is advanced on the right.

L4-L5: Disc narrowing and bulging with endplate spurring. Facet
osteoarthritis. Spinal stenosis and bilateral foraminal stenosis is
moderate.

L5-S1:Left paracentral to foraminal extrusion migrates superiorly
and severely compresses the L5 and descending S1 nerve roots. Facet
osteoarthritis on both sides. Disc narrowing and endplate spurring
asymmetric to the left.
IMPRESSION: 1. Sagittal T2 weighted imaging was inadvertently not acquired. The
available images show a convincing symptomatic finding such that the
patient was not automatically recalled, given his limited mobility.
If this is needed on clinical grounds the patient can be rescheduled
promptly for the missing images.
2. L5-S1 large left foraminal to paracentral extrusion with severe
compression of the L5 and S1 nerve roots.
3. There is diffuse degenerative disease with levoscoliosis.
4. Mild-to-moderate spinal stenosis at L2-3 to L4-5, especially
affecting the subarticular recesses.
5. Foraminal impingement on the right at L3-4 and bilaterally at
L4-5.

## 2020-08-24 ENCOUNTER — Encounter (INDEPENDENT_AMBULATORY_CARE_PROVIDER_SITE_OTHER): Payer: Medicare Other | Admitting: Ophthalmology

## 2020-08-31 ENCOUNTER — Ambulatory Visit: Payer: Medicare Other | Admitting: Internal Medicine

## 2020-08-31 ENCOUNTER — Other Ambulatory Visit: Payer: Self-pay

## 2020-08-31 VITALS — BP 134/69 | HR 71 | Temp 97.8°F | Resp 16 | Ht 68.0 in | Wt 149.0 lb

## 2020-08-31 DIAGNOSIS — R7989 Other specified abnormal findings of blood chemistry: Secondary | ICD-10-CM | POA: Diagnosis not present

## 2020-08-31 DIAGNOSIS — I5022 Chronic systolic (congestive) heart failure: Secondary | ICD-10-CM

## 2020-08-31 DIAGNOSIS — Z23 Encounter for immunization: Secondary | ICD-10-CM

## 2020-08-31 DIAGNOSIS — D649 Anemia, unspecified: Secondary | ICD-10-CM

## 2020-08-31 DIAGNOSIS — I10 Essential (primary) hypertension: Secondary | ICD-10-CM

## 2020-08-31 DIAGNOSIS — G8929 Other chronic pain: Secondary | ICD-10-CM

## 2020-08-31 DIAGNOSIS — M545 Low back pain, unspecified: Secondary | ICD-10-CM

## 2020-08-31 MED ORDER — PANTOPRAZOLE SODIUM 40 MG PO TBEC: 40.0000 mg | DELAYED_RELEASE_TABLET | Freq: Every day | ORAL | 3 refills | Status: DC

## 2020-08-31 MED ORDER — TAMSULOSIN HCL 0.4 MG PO CAPS: 0.4000 mg | ORAL_CAPSULE | Freq: Every day | ORAL | 3 refills | Status: AC

## 2020-08-31 NOTE — Progress Notes (Signed)
Subjective:    Patient ID: Christopher Hogan, male    DOB: [birth year], 86 y.o.   MRN: [id number]  DOS:  08/31/2020 Type of visit - description: Here with his wife  HTN: Reports normal ambulatory BPs Ongoing left-sided back pain, wonders what options he has. History of cough: That seems improving History of choking: That is improved Also feels tired on and off throughout the day. Denies chest pain, difficulty breathing or lower extremity edema   Review of Systems See above   Past Medical History:  Diagnosis Date  . AAA (abdominal aortic aneurysm) ([place])    repaired 02/2002, renal ultrasound, Sept 2010: normal abdominal aorta, normal renal arteries   . Age-related macular degeneration, wet, left eye ([place])   . Allergic rhinitis   . Arthritis   . At risk for pulmonary fibrosis 06/25/2018  . CAD (coronary artery disease)    stable stress test 7/11  . CHF (congestive heart failure) ([place])   . Chronic systolic CHF (congestive heart failure) ([place]) 12/17/2013  . Coronary atherosclerosis 06/09/2007   H/O inferior wall MI- PTCA- 1989 Last stress test 2/17- inferior scar, no ischemia    . DEGENERATIVE JOINT DISEASE 03/17/2010   Qualifier: Diagnosis of  By: Dawson Bills    . Dyslipidemia 05/06/2007   LDL 59 Jan 2019     . Eczema 07/09/2013  . ED (erectile dysfunction)   . Epidermoid cyst of skin of chest 09/29/2018  . ERECTILE DYSFUNCTION 12/09/2007   Qualifier: Diagnosis of  By: Larose Kells MD, [place] hypertension 04/30/2007   Qualifier: Diagnosis of  By: Larose Kells MD, [place] GERD 07/21/2008   Qualifier: Diagnosis of  By: Larose Kells MD, [place] GERD (gastroesophageal reflux disease)   . Glaucoma and macular degeneration 12/31/2011  . Hyperlipidemia   . Hypertension   . Inguinal hernia 12/31/2011  . Insomnia 02/22/2018  . Ischemic cardiomyopathy 06/23/2018   Last EF 30-44% by Myoview Feb 2017  . Left inguinal hernia 06/30/2018  . Myocardial infarction ([place]) 1989   interior wall infarction   . PCP notes >>>>>>>>>>>>>>>>>>>> 07/17/2016  . Pneumonia 2019  . Pre-operative cardiovascular examination 12/31/2011  . Rhinitis 04/20/2008   Qualifier: Diagnosis of  By: Larose Kells MD, [place] Vertigo 06/20/2015    Past Surgical History:  Procedure Laterality Date  . AAA repair  2003  . CARDIAC CATHETERIZATION    . CHOLECYSTECTOMY    . INGUINAL HERNIA REPAIR Right 1985  . INGUINAL HERNIA REPAIR Left 06/30/2018   open; w/mesh  . INGUINAL HERNIA REPAIR Left 06/30/2018   Procedure: OPEN LEFT INGUINAL HERNIA REPAIR;  Surgeon: Fanny Skates, MD;  Location: [facility];  Service: General;  Laterality: Left;  . INSERTION OF MESH Left 06/30/2018   Procedure: INSERTION OF MESH;  Surgeon: Fanny Skates, MD;  Location: [facility];  Service: General;  Laterality: Left;  [place] Kitchen MASS EXCISION N/A 09/29/2018   Procedure: EXCISION OF 3 CM MASS ANTERIOR CHEST WALL;  Surgeon: Fanny Skates, MD;  Location: [facility];  Service: General;  Laterality: N/A;  . [place]    Allergies as of 08/31/2020      Reactions   Ace Inhibitors Cough   Aspirin Nausea Only, Other (See Comments)   Full strength only   Spironolactone Rash      Medication List       Accurate as of August 31, 2020 11:59 PM. If you have  any questions, ask your nurse or doctor.        STOP taking these medications   azithromycin 250 MG tablet Commonly known as: Zithromax Z-Pak Stopped by: [name], MD   HYDROcodone-homatropine 5-1.5 MG/5ML syrup Commonly known as: HYCODAN Stopped by: [name], MD     TAKE these medications   albuterol 108 (90 Base) MCG/ACT inhaler Commonly known as: VENTOLIN HFA Inhale 2 puffs into the lungs every 6 (six) hours as needed for wheezing or shortness of breath.   amLODipine 2.5 MG tablet Commonly known as: NORVASC Take 1 tablet (2.5 mg total) by mouth daily.   aspirin 81 MG chewable tablet Chew 81 mg by mouth daily.   atenolol 50 MG tablet Commonly known as: TENORMIN Take 1  tablet (50 mg total) by mouth daily.   azelastine 0.1 % nasal spray Commonly known as: ASTELIN Place 2 sprays into both nostrils 2 (two) times daily. Use in each nostril as directed   dorzolamidel-timolol 22.3-6.8 MG/ML Soln ophthalmic solution Commonly known as: COSOPT Place 1 drop into both eyes 2 (two) times daily.   fenofibrate 48 MG tablet Commonly known as: TRICOR Take 48 mg by mouth daily.   HYDROcodone-acetaminophen 5-325 MG tablet Commonly known as: Norco 0.5-1 tablet po bid prn pain   ICAPS AREDS FORMULA PO Take 1 capsule by mouth daily.   ipratropium 0.06 % nasal spray Commonly known as: ATROVENT Place 2 sprays into both nostrils 4 (four) times daily.   meclizine 25 MG tablet Commonly known as: Antivert Take 1 tablet (25 mg total) by mouth 2 (two) times daily as needed for dizziness.   MELATONIN PO Take 1 tablet by mouth at bedtime as needed (sleep).   multivitamin capsule Take 1 capsule by mouth daily.   pantoprazole 40 MG tablet Commonly known as: PROTONIX Take 1 tablet (40 mg total) by mouth daily.   simvastatin 10 MG tablet Commonly known as: ZOCOR Take 1 tablet (10 mg total) by mouth at bedtime.   tamsulosin 0.4 MG Caps capsule Commonly known as: FLOMAX Take 1 capsule (0.4 mg total) by mouth daily after supper.          Objective:   Physical Exam BP 134/69 (BP Location: Left Arm, Patient Position: Sitting, Cuff Size: Small)   Pulse 71   Temp 97.8 F (36.6 C) (Oral)   Resp 16   Ht 5\' 8"  (1.727 m)   Wt 149 lb (67.6 kg)   SpO2 97%   BMI 22.66 kg/m  General:   Well developed, NAD, BMI noted. HEENT:  Normocephalic . Face symmetric, atraumatic Lungs:  Dry crackles at bases Normal respiratory effort, no intercostal retractions, no accessory muscle use. Heart: RRR,  no murmur.  Lower extremities: no pretibial edema bilaterally  Skin: Not pale. Not jaundice Neurologic:  alert & oriented X3.  Speech normal, gait appropriate for age and  unassisted Psych--  Cognition and judgment appear intact.  Cooperative with normal attention span and concentration.  Behavior appropriate. No anxious or depressed appearing.      Assessment      Assessment HTN Hyperlipidemia GERD Insomnia-- used to use elavil, re start  prn All rhinitis CV: --CAD, MI 1989, stress test (-) 2011 ,lexiscan no ischemia  08-2016  --CHF --AAA, s/p repair 2003, [place] 2010 ok Opht: glaucoma, macular degeneration  [facility]  Pulmonary fibrosis (on clinical grounds, see note from 10/2019) Barium swallow on 05/13/2020: Rx chin tuck, small sips Back pain   PLAN:  OMV:EHMCNOBS Tenormin, amlodipine, last BMP satisfactory. CHF: No evidence of volume overload. Low back pain: Ongoing issue, saw Ortho, had an MRI, had 2 local injections.  Wonders what else can be done. Recommend to make an appointment again ortho: 3th injection?  We had extensive discussion about pain management: Plan is for mild pain take Tylenol, if pain is most severe take hydrocodone, do not mix them.  Try to avoid NSAIDs. Fatigue: Feeling tired on and off, no specific cardiopulmonary symptoms, likely multifactorial. Mild anemia: Recheck a CBC Slight increased TSH: Recheck Dysphagia: Cough after eating decreased, following recommendations from barium swallow on 05/13/2020. LUTS: on tamsulosin, it is helping to some extent. Preventive care: Flu shot today RTC 4 months  This visit occurred during the SARS-CoV-2 public health emergency.  Safety protocols were in place, including screening questions prior to the visit, additional usage of staff PPE, and extensive cleaning of exam room while observing appropriate contact time as indicated for disinfecting solutions.

## 2020-08-31 NOTE — Patient Instructions (Signed)
Check the  blood pressure regularly BP GOAL is between 110/65 and  135/85. If it is consistently higher or lower, let me know   For pain: Take Tylenol if the pain is mild but take hydrocodone if the pain is moderate or severe.  Do not take them together Try to avoid ibuprofen, naproxen, Advil, Motrin.  They can affect your stomach and kidneys.   Tylenol  500 mg OTC 1 or  2 tabs a day every 8 hours as needed for pain Hydrocodone: Half or 1 tablet twice a day as needed   GO TO THE LAB : Get the blood work     [place], [place] back for a checkup in 4 months

## 2020-09-01 ENCOUNTER — Encounter (INDEPENDENT_AMBULATORY_CARE_PROVIDER_SITE_OTHER): Payer: Medicare Other | Admitting: Ophthalmology

## 2020-09-01 DIAGNOSIS — H43813 Vitreous degeneration, bilateral: Secondary | ICD-10-CM

## 2020-09-01 DIAGNOSIS — I10 Essential (primary) hypertension: Secondary | ICD-10-CM | POA: Diagnosis not present

## 2020-09-01 DIAGNOSIS — H353231 Exudative age-related macular degeneration, bilateral, with active choroidal neovascularization: Secondary | ICD-10-CM | POA: Diagnosis not present

## 2020-09-01 DIAGNOSIS — H35033 Hypertensive retinopathy, bilateral: Secondary | ICD-10-CM

## 2020-09-01 LAB — CBC WITH DIFFERENTIAL/PLATELET
Absolute Monocytes: 912 cells/uL (ref 200–950)
Basophils Absolute: 63 cells/uL (ref 0–200)
Basophils Relative: 1.1 %
Eosinophils Absolute: 274 cells/uL (ref 15–500)
Eosinophils Relative: 4.8 %
HCT: 36.2 % — ABNORMAL LOW (ref 38.5–50.0)
Hemoglobin: 12 g/dL — ABNORMAL LOW (ref 13.2–17.1)
Lymphs Abs: 923 cells/uL (ref 850–3900)
MCH: 31.8 pg (ref 27.0–33.0)
MCHC: 33.1 g/dL (ref 32.0–36.0)
MCV: 96 fL (ref 80.0–100.0)
MPV: 9.7 fL (ref 7.5–12.5)
Monocytes Relative: 16 %
Neutro Abs: 3528 cells/uL (ref 1500–7800)
Neutrophils Relative %: 61.9 %
Platelets: 143 10*3/uL (ref 140–400)
RBC: 3.77 10*6/uL — ABNORMAL LOW (ref 4.20–5.80)
RDW: 13.2 % (ref 11.0–15.0)
Total Lymphocyte: 16.2 %
WBC: 5.7 10*3/uL (ref 3.8–10.8)

## 2020-09-01 LAB — TSH: TSH: 5.44 mIU/L — ABNORMAL HIGH (ref 0.40–4.50)

## 2020-09-03 DIAGNOSIS — M5440 Lumbago with sciatica, unspecified side: Secondary | ICD-10-CM

## 2020-09-03 DIAGNOSIS — G8929 Other chronic pain: Secondary | ICD-10-CM

## 2020-09-03 DIAGNOSIS — M5441 Lumbago with sciatica, right side: Secondary | ICD-10-CM | POA: Insufficient documentation

## 2020-09-03 DIAGNOSIS — M5442 Lumbago with sciatica, left side: Secondary | ICD-10-CM

## 2020-09-03 HISTORY — DX: Other chronic pain: G89.29

## 2020-09-03 HISTORY — DX: Lumbago with sciatica, unspecified side: M54.40

## 2020-09-03 HISTORY — DX: Lumbago with sciatica, left side: M54.42

## 2020-09-03 NOTE — Assessment & Plan Note (Signed)
MMC:[id number] Tenormin, amlodipine, last BMP satisfactory. CHF: No evidence of volume overload. Low back pain: Ongoing issue, saw Ortho, had an MRI, had 2 local injections.  Wonders what else can be done. Recommend to make an appointment again ortho: 3th injection?  We had extensive discussion about pain management: Plan is for mild pain take Tylenol, if pain is most severe take hydrocodone, do not mix them.  Try to avoid NSAIDs. Fatigue: Feeling tired on and off, no specific cardiopulmonary symptoms, likely multifactorial. Mild anemia: Recheck a CBC Slight increased TSH: Recheck Dysphagia: Cough after eating decreased, following recommendations from barium swallow on 05/13/2020. LUTS: on tamsulosin, it is helping to some extent. Preventive care: Flu shot today RTC 4 months

## 2020-09-05 ENCOUNTER — Encounter: Payer: Self-pay | Admitting: Internal Medicine

## 2020-09-07 ENCOUNTER — Telehealth: Payer: Self-pay | Admitting: Internal Medicine

## 2020-09-07 MED ORDER — HYDROCODONE-ACETAMINOPHEN 5-325 MG PO TABS: ORAL_TABLET | ORAL | 0 refills | Status: DC

## 2020-09-07 NOTE — Telephone Encounter (Signed)
PDMP okay, Rx sent 

## 2020-09-07 NOTE — Telephone Encounter (Signed)
Requesting: hydrocodone-acetaminophen  Contract: None UDS: None Last Visit: 08/31/2020 Next Visit: 01/04/2021 Last Refill: 06/30/2020 #20 and 0RF Pt sig: 1/2 to 1 tab bid prn  Will need UDS and contract at next OV.   Please Advise

## 2020-09-07 NOTE — Telephone Encounter (Signed)
Medication: HYDROcodone-acetaminophen   Has the patient contacted their pharmacy? Yes.   (If no, request that the patient contact the pharmacy for the refill.) (If yes, when and what did the pharmacy advise?)  Preferred Pharmacy (with phone number or street name):   [id number] [place] [place] [address], [address]  [place], [address], [place] [address]  Phone:  [phone] Fax:  [phone]   Agent: Please be advised that RX refills may take up to 3 business days. We ask that you follow-up with your pharmacy.

## 2020-09-08 ENCOUNTER — Ambulatory Visit (INDEPENDENT_AMBULATORY_CARE_PROVIDER_SITE_OTHER): Payer: Medicare Other

## 2020-09-08 ENCOUNTER — Other Ambulatory Visit: Payer: Self-pay

## 2020-09-08 DIAGNOSIS — R531 Weakness: Secondary | ICD-10-CM | POA: Diagnosis not present

## 2020-09-08 MED ORDER — CYANOCOBALAMIN 1000 MCG/ML IJ SOLN
1000.0000 ug | Freq: Once | INTRAMUSCULAR | Status: AC
  Administered 2020-09-08: 1000 ug via INTRAMUSCULAR

## 2020-09-08 NOTE — Progress Notes (Addendum)
Christopher Hogan is a 86 y.o. male presents to the office today for cyanocobalamin 1000 mg/ml injections, per physician's orders. Original order on patient message from 09-05-20, for weakness. B12 shot was administered  today. Patient tolerated injection.  Patient next injection due: in one month, appt made for 10-12-20.  Jolene Provost Arrie Aran, MD

## 2020-09-13 NOTE — Progress Notes (Signed)
Virtual Visit via Telephone Note   This visit type was conducted due to national recommendations for restrictions regarding the COVID-19 Pandemic (e.g. social distancing) in an effort to limit this patient's exposure and mitigate transmission in our community.  Due to his co-morbid illnesses, this patient is at least at moderate risk for complications without adequate follow up.  This format is felt to be most appropriate for this patient at this time.  The patient did not have access to video technology/had technical difficulties with video requiring transitioning to audio format only (telephone).  All issues noted in this document were discussed and addressed.  No physical exam could be performed with this format.  Please refer to the patient's chart for his  consent to telehealth for [facility].    Date:  09/14/2020   ID:  Christopher Hogan, DOB 1925/01/31, MRN [id number] The patient was identified using 2 identifiers.  Patient Location: Home Provider Location: Home Office  PCP:  Colon Branch, MD  Cardiologist:  Ena Dawley, MD   Electrophysiologist:  None   Evaluation Performed:  Follow-Up Visit  Chief Complaint:  Follow-up (CAD, CHF), Shortness of Breath, and Chest Pain    Patient Profile: Christopher Hogan is a 86 y.o. male with:  Coronary artery disease  S/p remote inf MI 1989 tx with PTCA   Myoview in 2017 - inf scar, no ischemia   Heart failure with reduced ejection fraction   Ischemic CM  Myoview 2/17: EF 33  Hypertension   Hyperlipidemia   AAA s/p repair in 2003   Prior CV Studies: Myoview 01/13/16 EF 33, ant-lat and apical lat infarct, inf-lat and inf infarct, apical septal infarct; no ischemia   History of Present Illness:   Christopher Hogan was last seen by Dr. Meda Coffee in 11/2019.  He is seen for follow up.  His wife is on the telephone with him and helps somewhat with the history.  The patient notes that he has been exhausted over the past 30 days.  He  sleeps a lot during the day and has decreased appetite.  He has shortness of breath with activity and also notes substernal chest discomfort.  He has not had any radiating symptoms.  He feels dizzy when he tries to do things but has not had syncope.  He has not had orthopnea, paroxysmal nocturnal dyspnea or lower extremity swelling.  He was treated for bronchitis back in July.  He feels he has gotten over that.  However, recently has had a dry cough.  He has not had fevers or chills.  He has lumbar disc disease which contributes to a lot of pain and limits his mobility.  He has not had melena, hematochezia, hematuria.  He saw primary care recently.  His hemoglobin is minimally depressed and his TSH is minimally elevated.  Past Medical History:  Diagnosis Date  . AAA (abdominal aortic aneurysm) ([place])    repaired 02/2002, renal ultrasound, Sept 2010: normal abdominal aorta, normal renal arteries   . Age-related macular degeneration, wet, left eye ([place])   . Allergic rhinitis   . Arthritis   . At risk for pulmonary fibrosis 06/25/2018  . CAD (coronary artery disease)    stable stress test 7/11  . CHF (congestive heart failure) ([place])   . Chronic systolic CHF (congestive heart failure) ([place]) 12/17/2013  . Coronary atherosclerosis 06/09/2007   H/O inferior wall MI- PTCA- 1989 Last stress test 2/17- inferior scar, no ischemia    . DEGENERATIVE JOINT  DISEASE 03/17/2010   Qualifier: Diagnosis of  By: Dawson Bills    . Dyslipidemia 05/06/2007   LDL 59 Jan 2019     . Eczema 07/09/2013  . ED (erectile dysfunction)   . Epidermoid cyst of skin of chest 09/29/2018  . ERECTILE DYSFUNCTION 12/09/2007   Qualifier: Diagnosis of  By: Larose Kells MD, [place] hypertension 04/30/2007   Qualifier: Diagnosis of  By: Larose Kells MD, [place] GERD 07/21/2008   Qualifier: Diagnosis of  By: Larose Kells MD, [place] GERD (gastroesophageal reflux disease)   . Glaucoma and macular degeneration 12/31/2011  . Hyperlipidemia   .  Hypertension   . Inguinal hernia 12/31/2011  . Insomnia 02/22/2018  . Ischemic cardiomyopathy 06/23/2018   Last EF 30-44% by Myoview Feb 2017  . Left inguinal hernia 06/30/2018  . Myocardial infarction ([place]) 1989   interior wall infarction  . PCP notes >>>>>>>>>>>>>>>>>>>> 07/17/2016  . Pneumonia 2019  . Pre-operative cardiovascular examination 12/31/2011  . Rhinitis 04/20/2008   Qualifier: Diagnosis of  By: Larose Kells MD, [place] Vertigo 06/20/2015   Past Surgical History:  Procedure Laterality Date  . AAA repair  2003  . CARDIAC CATHETERIZATION    . CHOLECYSTECTOMY    . INGUINAL HERNIA REPAIR Right 1985  . INGUINAL HERNIA REPAIR Left 06/30/2018   open; w/mesh  . INGUINAL HERNIA REPAIR Left 06/30/2018   Procedure: OPEN LEFT INGUINAL HERNIA REPAIR;  Surgeon: Fanny Skates, MD;  Location: [place];  Service: General;  Laterality: Left;  . INSERTION OF MESH Left 06/30/2018   Procedure: INSERTION OF MESH;  Surgeon: Fanny Skates, MD;  Location: [place];  Service: General;  Laterality: Left;  Marland Kitchen MASS EXCISION N/A 09/29/2018   Procedure: EXCISION OF 3 CM MASS ANTERIOR CHEST WALL;  Surgeon: Fanny Skates, MD;  Location: [place];  Service: General;  Laterality: N/A;  . PTCA  1989, 1992     Current Meds  Medication Sig  . albuterol (VENTOLIN HFA) 108 (90 Base) MCG/ACT inhaler Inhale 2 puffs into the lungs every 6 (six) hours as needed for wheezing or shortness of breath.  Marland Kitchen amLODipine (NORVASC) 2.5 MG tablet Take 1 tablet (2.5 mg total) by mouth daily.  Marland Kitchen aspirin 81 MG chewable tablet Chew 81 mg by mouth daily.   Marland Kitchen atenolol (TENORMIN) 50 MG tablet Take 1 tablet (50 mg total) by mouth daily.  Marland Kitchen azelastine (ASTELIN) 0.1 % nasal spray Place 2 sprays into both nostrils 2 (two) times daily. Use in each nostril as directed  . Dorzolamide HCl-Timolol Mal PF 22.3-6.8 MG/ML SOLN Place 1 drop into both eyes 2 (two) times daily.   . fenofibrate (TRICOR) 48 MG tablet Take 48 mg by mouth daily.   Marland Kitchen HYDROcodone-acetaminophen (NORCO) 5-325 MG tablet 0.5-1 tablet po bid prn pain  . ipratropium (ATROVENT) 0.06 % nasal spray Place 2 sprays into both nostrils 4 (four) times daily.  . meclizine (ANTIVERT) 25 MG tablet Take 1 tablet (25 mg total) by mouth 2 (two) times daily as needed for dizziness.  Marland Kitchen MELATONIN PO Take 1 tablet by mouth at bedtime as needed (sleep).   . Multiple Vitamin (MULTIVITAMIN) capsule Take 1 capsule by mouth daily.    . Multiple Vitamins-Minerals (ICAPS AREDS FORMULA PO) Take 1 capsule by mouth daily.  . pantoprazole (PROTONIX) 40 MG tablet Take 1 tablet (40 mg total) by mouth daily.  . simvastatin (ZOCOR) 10 MG tablet Take 1  tablet (10 mg total) by mouth at bedtime.  . tamsulosin (FLOMAX) 0.4 MG CAPS capsule Take 1 capsule (0.4 mg total) by mouth daily after supper.     Allergies:   Ace inhibitors, Aspirin, and Spironolactone   Social History   Tobacco Use  . Smoking status: Former Research scientist (life sciences)  . Smokeless tobacco: Never Used  . Tobacco comment: quit in 1953  Vaping Use  . Vaping Use: Never used  Substance Use Topics  . Alcohol use: No  . Drug use: No     Family Hx: The patient's family history includes CAD in an other family member; Kidney Stones in his son; Lung cancer in his father. There is no history of Prostate cancer, Diabetes, or Colon cancer.  ROS:   Please see the history of present illness.    Labs/Other Tests and Data Reviewed:    EKG:  No ECG reviewed.  Recent Labs: 04/27/2020: ALT 12; BUN 14; Creatinine, Ser 0.96; Potassium 4.4; Sodium 137 08/31/2020: Hemoglobin 12.0; Platelets 143; TSH 5.44   Recent Lipid Panel Lab Results  Component Value Date/Time   CHOL 109 10/28/2019 10:56 AM   TRIG 152.0 (H) 10/28/2019 10:56 AM   HDL 38.80 (L) 10/28/2019 10:56 AM   CHOLHDL 3 10/28/2019 10:56 AM   LDLCALC 39 10/28/2019 10:56 AM   LDLDIRECT 57.3 07/29/2014 08:20 AM    Wt Readings from Last 3 Encounters:  09/14/20 149 lb (67.6 kg)  08/31/20  149 lb (67.6 kg)  06/13/20 152 lb 4 oz (69.1 kg)     Risk Assessment/Calculations:      Objective:    Vital Signs:  BP 119/66   Pulse 67   Ht 5\' 8"  (1.727 m)   Wt 149 lb (67.6 kg)   BMI 22.66 kg/m    VITAL SIGNS:  reviewed GEN:  no acute distress RESPIRATORY:  normal respiratory effort noted on the phone PSYCH:  normal affect  ASSESSMENT & PLAN:    1. Shortness of breath 2. Coronary artery disease involving native coronary artery of native heart with angina pectoris (HCC) 3. HFrEF (heart failure with reduced ejection fraction) ([place]) He has progressive symptoms of shortness of breath, fatigue and chest discomfort over the past month.  He has seen primary care.  He has mild anemia without significant change.  His TSH is minimally elevated.  Upon review of his chart, there is some evidence of pulmonary fibrosis based upon clinical findings in the past (see OV note from 10/2019).  Question if his symptoms could be explained by worsening pulmonary fibrosis.  In the past, he has been felt to be asymptomatic.  He certainly is at risk for progressive coronary artery disease.  Given his advanced age, I think it would be best to try to avoid cardiac catheterization which would carry significant risk.  Therefore, I'm not certain a Myoview would provide much benefit at this point and would prefer he be seen in person before we decide to pursue ischemic testing.  He also has a history of heart failure with reduced ejection fraction.  He is certainly at risk for volume excess.  However, his weights are stable.  He has no reports of lower extremity swelling or orthopnea.  I cannot find an echocardiogram on file for him.  He could have advanced valvular heart disease contributing to symptoms.  He does note significant issues with his back.  Question if this may be related to deconditioning and limitations related to his back problem.  It is difficult to  make a clear diagnosis over the telephone.  I have  recommended that we try long-acting nitrates, obtain labs and a chest x-ray and see him back in the office within the next week to get an EKG and further evaluate.  He does not take PDE-5 inhibitors.  He knows to go to the emergency room if his symptoms should progress or occur at rest.  -Start isosorbide mononitrate 15 mg daily  -Rx for sublingual nitroglycerin as needed  -Continue aspirin, beta-blocker, statin  -Obtain echocardiogram  -Obtain BMET, BNP, chest x-ray  -If BNP significantly elevated, start furosemide  -Follow-up with Dr. Meda Coffee or APP within the next 7 days with an EKG  4. Essential hypertension The patient's blood pressure is controlled on his current regimen.  Continue current therapy.   5. Mixed hyperlipidemia LDL optimal on most recent lab work.  Continue current Rx.      Time:   Today, I have spent 22 minutes with the patient with telehealth technology discussing the above problems.     Medication Adjustments/Labs and Tests Ordered: Current medicines are reviewed at length with the patient today.  Concerns regarding medicines are outlined above.   Tests Ordered: Orders Placed This Encounter  Procedures  . DG Chest 2 View  . Basic metabolic panel  . Pro b natriuretic peptide (BNP)  . ECHOCARDIOGRAM COMPLETE    Medication Changes: Meds ordered this encounter  Medications  . isosorbide mononitrate (IMDUR) 30 MG 24 hr tablet    Sig: Take 0.5 tablets (15 mg total) by mouth daily.    Dispense:  15 tablet    Refill:  11  . nitroGLYCERIN (NITROSTAT) 0.4 MG SL tablet    Sig: Place 1 tablet (0.4 mg total) under the tongue every 5 (five) minutes as needed for chest pain.    Dispense:  25 tablet    Refill:  3    Follow Up:  In Person in 7 day(s)  Signed, Richardson Dopp, PA-C  09/14/2020 5:03 PM     [facility]

## 2020-09-14 ENCOUNTER — Telehealth (INDEPENDENT_AMBULATORY_CARE_PROVIDER_SITE_OTHER): Payer: Medicare Other | Admitting: Physician Assistant

## 2020-09-14 ENCOUNTER — Telehealth: Payer: Self-pay

## 2020-09-14 ENCOUNTER — Encounter: Payer: Self-pay | Admitting: Physician Assistant

## 2020-09-14 ENCOUNTER — Other Ambulatory Visit: Payer: Self-pay

## 2020-09-14 VITALS — BP 119/66 | HR 67 | Ht 68.0 in | Wt 149.0 lb

## 2020-09-14 DIAGNOSIS — E782 Mixed hyperlipidemia: Secondary | ICD-10-CM

## 2020-09-14 DIAGNOSIS — R0602 Shortness of breath: Secondary | ICD-10-CM | POA: Diagnosis not present

## 2020-09-14 DIAGNOSIS — I2510 Atherosclerotic heart disease of native coronary artery without angina pectoris: Secondary | ICD-10-CM

## 2020-09-14 DIAGNOSIS — I25119 Atherosclerotic heart disease of native coronary artery with unspecified angina pectoris: Secondary | ICD-10-CM

## 2020-09-14 DIAGNOSIS — I5020 Unspecified systolic (congestive) heart failure: Secondary | ICD-10-CM | POA: Diagnosis not present

## 2020-09-14 DIAGNOSIS — I10 Essential (primary) hypertension: Secondary | ICD-10-CM

## 2020-09-14 MED ORDER — ISOSORBIDE MONONITRATE ER 30 MG PO TB24
15.0000 mg | ORAL_TABLET | Freq: Every day | ORAL | 11 refills | Status: DC
Start: 2020-09-14 — End: 2020-10-20

## 2020-09-14 MED ORDER — NITROGLYCERIN 0.4 MG SL SUBL: 0.4000 mg | SUBLINGUAL_TABLET | SUBLINGUAL | 3 refills | Status: AC | PRN

## 2020-09-14 NOTE — Patient Instructions (Signed)
Medication Instructions:  Your physician has recommended you make the following change in your medication:   1) Start Isosorbide 30 mg, take 0.5 tablet (15 mg) by mouth once a day 2) Start Nitroglycerin 0.4 mg, take 1 tablet sublingual as needed for chest pain  *If you need a refill on your cardiac medications before your next appointment, please call your pharmacy*  Lab Work: Your physician recommends that you return for lab work on for BMET/BNP  If you have labs (blood work) drawn today and your tests are completely normal, you will receive your results only by: Marland Kitchen MyChart Message (if you have MyChart) OR . A paper copy in the mail If you have any lab test that is abnormal or we need to change your treatment, we will call you to review the results.  Testing/Procedures: Your physician has requested that you have an echocardiogram. Echocardiography is a painless test that uses sound waves to create images of your heart. It provides your doctor with information about the size and shape of your heart and how well your heart's chambers and valves are working. This procedure takes approximately one hour. There are no restrictions for this procedure.  Chest X-ray Instructions:    1. You may have this done at the [facility], located in the [place] on the 1st floor.    2. You do no have to have an appointment.    3. [place], [place] [id number]        [phone]        Monday - Friday  8:00 am - 5:00 pm   Follow-Up:

## 2020-09-14 NOTE — Telephone Encounter (Signed)
  Patient Consent for Virtual Visit         Christopher Hogan has provided verbal consent on 09/14/2020 for a virtual visit (video or telephone).   CONSENT FOR VIRTUAL VISIT FOR:  Christopher Hogan  By participating in this virtual visit I agree to the following:  I hereby voluntarily request, consent and authorize [facility] and its employed or contracted physicians, physician assistants, nurse practitioners or other licensed health care professionals (the Practitioner), to provide me with telemedicine health care services (the "Services") as deemed necessary by the treating Practitioner. I acknowledge and consent to receive the Services by the Practitioner via telemedicine. I understand that the telemedicine visit will involve communicating with the Practitioner through live audiovisual communication technology and the disclosure of certain medical information by electronic transmission. I acknowledge that I have been given the opportunity to request an in-person assessment or other available alternative prior to the telemedicine visit and am voluntarily participating in the telemedicine visit.  I understand that I have the right to withhold or withdraw my consent to the use of telemedicine in the course of my care at any time, without affecting my right to future care or treatment, and that the Practitioner or I may terminate the telemedicine visit at any time. I understand that I have the right to inspect all information obtained and/or recorded in the course of the telemedicine visit and may receive copies of available information for a reasonable fee.  I understand that some of the potential risks of receiving the Services via telemedicine include:  Marland Kitchen Delay or interruption in medical evaluation due to technological equipment failure or disruption; . Information transmitted may not be sufficient (e.g. poor resolution of images) to allow for appropriate medical decision making by the  Practitioner; and/or  . In rare instances, security protocols could fail, causing a breach of personal health information.  Furthermore, I acknowledge that it is my responsibility to provide information about my medical history, conditions and care that is complete and accurate to the best of my ability. I acknowledge that Practitioner's advice, recommendations, and/or decision may be based on factors not within their control, such as incomplete or inaccurate data provided by me or distortions of diagnostic images or specimens that may result from electronic transmissions. I understand that the practice of medicine is not an exact science and that Practitioner makes no warranties or guarantees regarding treatment outcomes. I acknowledge that a copy of this consent can be made available to me via my patient portal ([facility]), or I can request a printed copy by calling the office of [facility].    I understand that my insurance will be billed for this visit.   I have read or had this consent read to me. . I understand the contents of this consent, which adequately explains the benefits and risks of the Services being provided via telemedicine.  . I have been provided ample opportunity to ask questions regarding this consent and the Services and have had my questions answered to my satisfaction. . I give my informed consent for the services to be provided through the use of telemedicine in my medical care

## 2020-09-14 NOTE — Addendum Note (Signed)
Addended by: [name] E on: 09/14/2020 04:36 PM   Modules accepted: Level of Service

## 2020-09-16 ENCOUNTER — Other Ambulatory Visit: Payer: Self-pay

## 2020-09-16 ENCOUNTER — Inpatient Hospital Stay (HOSPITAL_COMMUNITY): Payer: Medicare Other

## 2020-09-16 ENCOUNTER — Other Ambulatory Visit: Payer: Medicare Other

## 2020-09-16 ENCOUNTER — Inpatient Hospital Stay (HOSPITAL_COMMUNITY)
Admission: EM | Admit: 2020-09-16 | Discharge: 2020-09-20 | DRG: 193 | Disposition: A | Discharge status: Home Health | Payer: Medicare Other | Attending: Student | Admitting: Student

## 2020-09-16 ENCOUNTER — Emergency Department (HOSPITAL_COMMUNITY): Payer: Medicare Other

## 2020-09-16 ENCOUNTER — Encounter (HOSPITAL_COMMUNITY): Payer: Self-pay

## 2020-09-16 DIAGNOSIS — H409 Unspecified glaucoma: Secondary | ICD-10-CM | POA: Diagnosis present

## 2020-09-16 DIAGNOSIS — Z888 Allergy status to other drugs, medicaments and biological substances status: Secondary | ICD-10-CM

## 2020-09-16 DIAGNOSIS — R54 Age-related physical debility: Secondary | ICD-10-CM | POA: Diagnosis present

## 2020-09-16 DIAGNOSIS — I2510 Atherosclerotic heart disease of native coronary artery without angina pectoris: Secondary | ICD-10-CM | POA: Diagnosis not present

## 2020-09-16 DIAGNOSIS — Z7982 Long term (current) use of aspirin: Secondary | ICD-10-CM

## 2020-09-16 DIAGNOSIS — K219 Gastro-esophageal reflux disease without esophagitis: Secondary | ICD-10-CM | POA: Diagnosis not present

## 2020-09-16 DIAGNOSIS — I255 Ischemic cardiomyopathy: Secondary | ICD-10-CM | POA: Diagnosis present

## 2020-09-16 DIAGNOSIS — J9601 Acute respiratory failure with hypoxia: Secondary | ICD-10-CM | POA: Diagnosis present

## 2020-09-16 DIAGNOSIS — R5381 Other malaise: Secondary | ICD-10-CM | POA: Diagnosis not present

## 2020-09-16 DIAGNOSIS — J129 Viral pneumonia, unspecified: Principal | ICD-10-CM | POA: Diagnosis present

## 2020-09-16 DIAGNOSIS — K5900 Constipation, unspecified: Secondary | ICD-10-CM | POA: Diagnosis not present

## 2020-09-16 DIAGNOSIS — E871 Hypo-osmolality and hyponatremia: Secondary | ICD-10-CM | POA: Diagnosis not present

## 2020-09-16 DIAGNOSIS — J9691 Respiratory failure, unspecified with hypoxia: Secondary | ICD-10-CM | POA: Diagnosis present

## 2020-09-16 DIAGNOSIS — H353290 Exudative age-related macular degeneration, unspecified eye, stage unspecified: Secondary | ICD-10-CM | POA: Diagnosis not present

## 2020-09-16 DIAGNOSIS — Z801 Family history of malignant neoplasm of trachea, bronchus and lung: Secondary | ICD-10-CM | POA: Diagnosis not present

## 2020-09-16 DIAGNOSIS — R531 Weakness: Secondary | ICD-10-CM | POA: Diagnosis not present

## 2020-09-16 DIAGNOSIS — R0902 Hypoxemia: Secondary | ICD-10-CM

## 2020-09-16 DIAGNOSIS — I252 Old myocardial infarction: Secondary | ICD-10-CM

## 2020-09-16 DIAGNOSIS — Z79899 Other long term (current) drug therapy: Secondary | ICD-10-CM

## 2020-09-16 DIAGNOSIS — Z8249 Family history of ischemic heart disease and other diseases of the circulatory system: Secondary | ICD-10-CM | POA: Diagnosis not present

## 2020-09-16 DIAGNOSIS — I10 Essential (primary) hypertension: Secondary | ICD-10-CM

## 2020-09-16 DIAGNOSIS — D519 Vitamin B12 deficiency anemia, unspecified: Secondary | ICD-10-CM | POA: Diagnosis present

## 2020-09-16 DIAGNOSIS — J189 Pneumonia, unspecified organism: Secondary | ICD-10-CM | POA: Diagnosis not present

## 2020-09-16 DIAGNOSIS — I110 Hypertensive heart disease with heart failure: Secondary | ICD-10-CM | POA: Diagnosis not present

## 2020-09-16 DIAGNOSIS — Z20822 Contact with and (suspected) exposure to covid-19: Secondary | ICD-10-CM | POA: Diagnosis present

## 2020-09-16 DIAGNOSIS — I951 Orthostatic hypotension: Secondary | ICD-10-CM | POA: Diagnosis not present

## 2020-09-16 DIAGNOSIS — N400 Enlarged prostate without lower urinary tract symptoms: Secondary | ICD-10-CM | POA: Diagnosis not present

## 2020-09-16 DIAGNOSIS — Z87891 Personal history of nicotine dependence: Secondary | ICD-10-CM

## 2020-09-16 DIAGNOSIS — I5042 Chronic combined systolic (congestive) and diastolic (congestive) heart failure: Secondary | ICD-10-CM | POA: Diagnosis present

## 2020-09-16 DIAGNOSIS — M1990 Unspecified osteoarthritis, unspecified site: Secondary | ICD-10-CM | POA: Diagnosis not present

## 2020-09-16 DIAGNOSIS — R7989 Other specified abnormal findings of blood chemistry: Secondary | ICD-10-CM

## 2020-09-16 DIAGNOSIS — I700 Atherosclerosis of aorta: Secondary | ICD-10-CM | POA: Diagnosis not present

## 2020-09-16 DIAGNOSIS — E785 Hyperlipidemia, unspecified: Secondary | ICD-10-CM | POA: Diagnosis not present

## 2020-09-16 DIAGNOSIS — I5031 Acute diastolic (congestive) heart failure: Secondary | ICD-10-CM | POA: Diagnosis not present

## 2020-09-16 DIAGNOSIS — Z8679 Personal history of other diseases of the circulatory system: Secondary | ICD-10-CM | POA: Diagnosis not present

## 2020-09-16 DIAGNOSIS — L309 Dermatitis, unspecified: Secondary | ICD-10-CM | POA: Diagnosis present

## 2020-09-16 DIAGNOSIS — R059 Cough, unspecified: Secondary | ICD-10-CM | POA: Diagnosis not present

## 2020-09-16 DIAGNOSIS — R0602 Shortness of breath: Secondary | ICD-10-CM | POA: Diagnosis not present

## 2020-09-16 DIAGNOSIS — D649 Anemia, unspecified: Secondary | ICD-10-CM | POA: Diagnosis not present

## 2020-09-16 DIAGNOSIS — R9389 Abnormal findings on diagnostic imaging of other specified body structures: Secondary | ICD-10-CM | POA: Diagnosis not present

## 2020-09-16 DIAGNOSIS — I517 Cardiomegaly: Secondary | ICD-10-CM | POA: Diagnosis not present

## 2020-09-16 DIAGNOSIS — I5022 Chronic systolic (congestive) heart failure: Secondary | ICD-10-CM | POA: Diagnosis not present

## 2020-09-16 HISTORY — DX: Respiratory failure, unspecified with hypoxia: J96.91

## 2020-09-16 LAB — URINALYSIS, ROUTINE W REFLEX MICROSCOPIC
Bilirubin Urine: NEGATIVE
Glucose, UA: NEGATIVE mg/dL
Hgb urine dipstick: NEGATIVE
Ketones, ur: NEGATIVE mg/dL
Leukocytes,Ua: NEGATIVE
Nitrite: NEGATIVE
Protein, ur: NEGATIVE mg/dL
Specific Gravity, Urine: 1.016 (ref 1.005–1.030)
pH: 8 (ref 5.0–8.0)

## 2020-09-16 LAB — COMPREHENSIVE METABOLIC PANEL
ALT: 34 U/L (ref 0–44)
AST: 38 U/L (ref 15–41)
Albumin: 3.3 g/dL — ABNORMAL LOW (ref 3.5–5.0)
Alkaline Phosphatase: 59 U/L (ref 38–126)
Anion gap: 10 (ref 5–15)
BUN: 11 mg/dL (ref 8–23)
CO2: 24 mmol/L (ref 22–32)
Calcium: 8.8 mg/dL — ABNORMAL LOW (ref 8.9–10.3)
Chloride: 100 mmol/L (ref 98–111)
Creatinine, Ser: 0.96 mg/dL (ref 0.61–1.24)
GFR, Estimated: >60 mL/min (ref >60)
Glucose, Bld: 121 mg/dL — ABNORMAL HIGH (ref 70–99)
Potassium: 4.1 mmol/L (ref 3.5–5.1)
Sodium: 134 mmol/L — ABNORMAL LOW (ref 135–145)
Total Bilirubin: 1.1 mg/dL (ref 0.3–1.2)
Total Protein: 6.2 g/dL — ABNORMAL LOW (ref 6.5–8.1)

## 2020-09-16 LAB — CBC WITH DIFFERENTIAL/PLATELET
Abs Immature Granulocytes: 0.06 10*3/uL (ref 0.00–0.07)
Basophils Absolute: 0.1 10*3/uL (ref 0.0–0.1)
Basophils Relative: 1 %
Eosinophils Absolute: 0.1 10*3/uL (ref 0.0–0.5)
Eosinophils Relative: 1 %
HCT: 39.7 % (ref 39.0–52.0)
Hemoglobin: 12.7 g/dL — ABNORMAL LOW (ref 13.0–17.0)
Immature Granulocytes: 1 %
Lymphocytes Relative: 10 %
Lymphs Abs: 0.8 10*3/uL (ref 0.7–4.0)
MCH: 31.2 pg (ref 26.0–34.0)
MCHC: 32 g/dL (ref 30.0–36.0)
MCV: 97.5 fL (ref 80.0–100.0)
Monocytes Absolute: 0.9 10*3/uL (ref 0.1–1.0)
Monocytes Relative: 11 %
Neutro Abs: 6.2 10*3/uL (ref 1.7–7.7)
Neutrophils Relative %: 76 %
Platelets: 264 10*3/uL (ref 150–400)
RBC: 4.07 MIL/uL — ABNORMAL LOW (ref 4.22–5.81)
RDW: 14.3 % (ref 11.5–15.5)
WBC: 8.1 10*3/uL (ref 4.0–10.5)
nRBC: 0 % (ref 0.0–0.2)

## 2020-09-16 LAB — TSH: TSH: 2.903 u[IU]/mL (ref 0.350–4.500)

## 2020-09-16 LAB — TROPONIN I (HIGH SENSITIVITY)
Troponin I (High Sensitivity): 6 ng/L (ref <18)
Troponin I (High Sensitivity): 7 ng/L (ref <18)

## 2020-09-16 LAB — LACTATE DEHYDROGENASE: LDH: 200 U/L — ABNORMAL HIGH (ref 98–192)

## 2020-09-16 LAB — RESPIRATORY PANEL BY RT PCR (FLU A&B, COVID)
Influenza A by PCR: NEGATIVE
Influenza A by PCR: NEGATIVE
Influenza B by PCR: NEGATIVE
Influenza B by PCR: NEGATIVE
SARS Coronavirus 2 by RT PCR: NEGATIVE
SARS Coronavirus 2 by RT PCR: NEGATIVE

## 2020-09-16 LAB — T4, FREE: Free T4: 0.9 ng/dL (ref 0.61–1.12)

## 2020-09-16 LAB — LACTIC ACID, PLASMA: Lactic Acid, Venous: 1.6 mmol/L (ref 0.5–1.9)

## 2020-09-16 LAB — BRAIN NATRIURETIC PEPTIDE: B Natriuretic Peptide: 389.3 pg/mL — ABNORMAL HIGH (ref 0.0–100.0)

## 2020-09-16 LAB — D-DIMER, QUANTITATIVE: D-Dimer, Quant: 3.46 ug/mL-FEU — ABNORMAL HIGH (ref 0.00–0.50)

## 2020-09-16 LAB — C-REACTIVE PROTEIN: CRP: 4.4 mg/dL — ABNORMAL HIGH (ref <1.0)

## 2020-09-16 LAB — MAGNESIUM: Magnesium: 1.9 mg/dL (ref 1.7–2.4)

## 2020-09-16 LAB — FERRITIN: Ferritin: 182 ng/mL (ref 24–336)

## 2020-09-16 LAB — PHOSPHORUS: Phosphorus: 3.3 mg/dL (ref 2.5–4.6)

## 2020-09-16 MED ORDER — AMLODIPINE BESYLATE 2.5 MG PO TABS
2.5000 mg | ORAL_TABLET | Freq: Every day | ORAL | Status: DC
  Administered 2020-09-17 – 2020-09-18 (×2): 2.5 mg via ORAL
  Filled 2020-09-16 (×2): qty 1

## 2020-09-16 MED ORDER — SODIUM CHLORIDE 0.9 % IV SOLN
500.0000 mg | INTRAVENOUS | Status: DC
  Administered 2020-09-16 – 2020-09-19 (×4): 500 mg via INTRAVENOUS
  Filled 2020-09-16 (×5): qty 500

## 2020-09-16 MED ORDER — IPRATROPIUM-ALBUTEROL 20-100 MCG/ACT IN AERS
1.0000 | INHALATION_SPRAY | Freq: Four times a day (QID) | RESPIRATORY_TRACT | Status: DC
  Administered 2020-09-17 (×2): 1 via RESPIRATORY_TRACT
  Filled 2020-09-16 (×2): qty 4

## 2020-09-16 MED ORDER — IPRATROPIUM BROMIDE 0.06 % NA SOLN: 2.0000 | Freq: Four times a day (QID) | NASAL | Status: DC

## 2020-09-16 MED ORDER — PREDNISONE 50 MG PO TABS
50.0000 mg | ORAL_TABLET | Freq: Every day | ORAL | Status: DC
  Administered 2020-09-20 (×2): 50 mg via ORAL
  Filled 2020-09-16 (×2): qty 1

## 2020-09-16 MED ORDER — IOHEXOL 350 MG/ML SOLN
65.0000 mL | Freq: Once | INTRAVENOUS | Status: AC | PRN
  Administered 2020-09-16: 65 mL via INTRAVENOUS

## 2020-09-16 MED ORDER — TAMSULOSIN HCL 0.4 MG PO CAPS
0.4000 mg | ORAL_CAPSULE | Freq: Every day | ORAL | Status: DC
  Administered 2020-09-17 – 2020-09-19 (×3): 0.4 mg via ORAL
  Filled 2020-09-16 (×4): qty 1

## 2020-09-16 MED ORDER — PIPERACILLIN-TAZOBACTAM 3.375 G IVPB
3.3750 g | Freq: Three times a day (TID) | INTRAVENOUS | Status: DC
  Administered 2020-09-16 – 2020-09-17 (×2): 3.375 g via INTRAVENOUS
  Filled 2020-09-16 (×3): qty 50

## 2020-09-16 MED ORDER — PANTOPRAZOLE SODIUM 40 MG PO TBEC
40.0000 mg | DELAYED_RELEASE_TABLET | Freq: Every day | ORAL | Status: DC
  Administered 2020-09-16 – 2020-09-20 (×5): 40 mg via ORAL
  Filled 2020-09-16 (×5): qty 1

## 2020-09-16 MED ORDER — AZELASTINE HCL 0.1 % NA SOLN
2.0000 | Freq: Two times a day (BID) | NASAL | Status: DC
  Administered 2020-09-17 – 2020-09-20 (×7): 2 via NASAL
  Filled 2020-09-16: qty 30

## 2020-09-16 MED ORDER — ASCORBIC ACID 500 MG PO TABS
500.0000 mg | ORAL_TABLET | Freq: Every day | ORAL | Status: DC
  Administered 2020-09-16 – 2020-09-20 (×5): 500 mg via ORAL
  Filled 2020-09-16 (×5): qty 1

## 2020-09-16 MED ORDER — HEPARIN SODIUM (PORCINE) 5000 UNIT/ML IJ SOLN
5000.0000 [IU] | Freq: Three times a day (TID) | INTRAMUSCULAR | Status: DC
  Administered 2020-09-16 – 2020-09-20 (×11): 5000 [IU] via SUBCUTANEOUS
  Filled 2020-09-16 (×11): qty 1

## 2020-09-16 MED ORDER — ZINC SULFATE 220 (50 ZN) MG PO CAPS
220.0000 mg | ORAL_CAPSULE | Freq: Every day | ORAL | Status: DC
  Administered 2020-09-16 – 2020-09-20 (×5): 220 mg via ORAL
  Filled 2020-09-16 (×5): qty 1

## 2020-09-16 MED ORDER — DORZOLAMIDE HCL-TIMOLOL MAL 2-0.5 % OP SOLN
1.0000 [drp] | Freq: Two times a day (BID) | OPHTHALMIC | Status: DC
  Administered 2020-09-17 – 2020-09-20 (×7): 1 [drp] via OPHTHALMIC
  Filled 2020-09-16: qty 10

## 2020-09-16 MED ORDER — FENOFIBRATE 54 MG PO TABS
54.0000 mg | ORAL_TABLET | Freq: Every day | ORAL | Status: DC
  Administered 2020-09-17 – 2020-09-20 (×4): 54 mg via ORAL
  Filled 2020-09-16 (×4): qty 1

## 2020-09-16 MED ORDER — GUAIFENESIN-DM 100-10 MG/5ML PO SYRP: 10.0000 mL | ORAL_SOLUTION | ORAL | Status: DC | PRN

## 2020-09-16 MED ORDER — ISOSORBIDE MONONITRATE ER 30 MG PO TB24
15.0000 mg | ORAL_TABLET | Freq: Every day | ORAL | Status: DC
  Administered 2020-09-17 – 2020-09-20 (×4): 15 mg via ORAL
  Filled 2020-09-16 (×4): qty 1

## 2020-09-16 MED ORDER — SIMVASTATIN 20 MG PO TABS
10.0000 mg | ORAL_TABLET | Freq: Every day | ORAL | Status: DC
  Administered 2020-09-16 – 2020-09-19 (×4): 10 mg via ORAL
  Filled 2020-09-16 (×4): qty 1

## 2020-09-16 MED ORDER — HYDROCODONE-ACETAMINOPHEN 5-325 MG PO TABS
1.0000 | ORAL_TABLET | ORAL | Status: DC | PRN
  Administered 2020-09-18: 1 via ORAL
  Filled 2020-09-16: qty 1

## 2020-09-16 MED ORDER — METHYLPREDNISOLONE SODIUM SUCC 125 MG IJ SOLR
1.0000 mg/kg | Freq: Two times a day (BID) | INTRAMUSCULAR | Status: AC
  Administered 2020-09-16 – 2020-09-19 (×6): 68.75 mg via INTRAVENOUS
  Filled 2020-09-16 (×6): qty 2

## 2020-09-16 MED ORDER — DORZOLAMIDE HCL-TIMOLOL MAL PF 22.3-6.8 MG/ML OP SOLN: 1.0000 [drp] | Freq: Two times a day (BID) | OPHTHALMIC | Status: DC

## 2020-09-16 MED ORDER — HYDROCOD POLST-CPM POLST ER 10-8 MG/5ML PO SUER: 5.0000 mL | Freq: Two times a day (BID) | ORAL | Status: DC | PRN

## 2020-09-16 MED ORDER — ALBUTEROL SULFATE HFA 108 (90 BASE) MCG/ACT IN AERS
2.0000 | INHALATION_SPRAY | Freq: Four times a day (QID) | RESPIRATORY_TRACT | Status: DC | PRN
  Filled 2020-09-16: qty 6.7

## 2020-09-16 MED ORDER — ATENOLOL 50 MG PO TABS
50.0000 mg | ORAL_TABLET | Freq: Every day | ORAL | Status: DC
  Administered 2020-09-17: 50 mg via ORAL
  Filled 2020-09-16: qty 1

## 2020-09-16 MED ORDER — ASPIRIN 81 MG PO CHEW
81.0000 mg | CHEWABLE_TABLET | Freq: Every day | ORAL | Status: DC
  Administered 2020-09-17 – 2020-09-20 (×4): 81 mg via ORAL
  Filled 2020-09-16 (×4): qty 1

## 2020-09-16 MED ORDER — LATANOPROST 0.005 % OP SOLN
1.0000 [drp] | Freq: Every day | OPHTHALMIC | Status: DC
  Administered 2020-09-17 – 2020-09-19 (×3): 1 [drp] via OPHTHALMIC
  Filled 2020-09-16: qty 2.5

## 2020-09-16 NOTE — Progress Notes (Signed)
Pharmacy Antibiotic Note  Christopher Hogan is a 86 y.o. male admitted on 09/16/2020 with pneumonia.  Pharmacy has been consulted for zosyn dosing.  Plan: Zosyn 3.375g IV every 8 hours (extended infusion) Monitor renal function, clinical progression and LOT  Height: 5\' 7"  (170.2 cm) Weight: 68.5 kg (151 lb) IBW/kg (Calculated) : 66.1  Temp (24hrs), Avg:98.4 F (36.9 C), Min:98.4 F (36.9 C), Max:98.4 F (36.9 C)  Recent Labs  Lab 09/16/20 1216 09/16/20 1545  WBC 8.1  --   CREATININE 0.96  --   LATICACIDVEN  --  1.6    Estimated Creatinine Clearance: 45.9 mL/min (by C-G formula based on SCr of 0.96 mg/dL).    Allergies  Allergen Reactions  . Ace Inhibitors Cough  . Aspirin Nausea Only and Other (See Comments)    Full strength only  . Spironolactone Rash   Bertis Ruddy, PharmD Clinical Pharmacist ED Pharmacist Phone # [phone] 09/16/2020 8:22 PM

## 2020-09-16 NOTE — ED Provider Notes (Signed)
[facility] EMERGENCY DEPARTMENT Provider Note   CSN: [id number] Arrival date & time: 09/16/20  1154     History Chief Complaint  Patient presents with  . Weakness    "nothing taste right", cough, muscle aches, shortness of breath.     Christopher Hogan is a 86 y.o. male.  HPI 86 year old male with a history of AAA, CAD, CHF, ED presents to the ER with several days of nasal congestion with drainage, productive cough with yellow phlegm, weakness, "nothing tasting right" and exertional shortness of breath.  Patient denies any fevers or chills.  Denies any chest pain, dizziness, syncope, back pain.  Does not have any oxygen requirements at home.  He is vaccinated for Covid.  No other sick contacts. Denies any nausea, vomiting, diarrhea, dysuria, constipation    Past Medical History:  Diagnosis Date  . AAA (abdominal aortic aneurysm) ([place])    repaired 02/2002, renal ultrasound, Sept 2010: normal abdominal aorta, normal renal arteries   . Age-related macular degeneration, wet, left eye ([place])   . Allergic rhinitis   . Arthritis   . At risk for pulmonary fibrosis 06/25/2018  . CAD (coronary artery disease)    stable stress test 7/11  . CHF (congestive heart failure) ([place])   . Chronic systolic CHF (congestive heart failure) ([place]) 12/17/2013  . Coronary atherosclerosis 06/09/2007   H/O inferior wall MI- PTCA- 1989 Last stress test 2/17- inferior scar, no ischemia    . DEGENERATIVE JOINT DISEASE 03/17/2010   Qualifier: Diagnosis of  By: Dawson Bills    . Dyslipidemia 05/06/2007   LDL 59 Jan 2019     . Eczema 07/09/2013  . ED (erectile dysfunction)   . Epidermoid cyst of skin of chest 09/29/2018  . ERECTILE DYSFUNCTION 12/09/2007   Qualifier: Diagnosis of  By: Larose Kells MD, [place] hypertension 04/30/2007   Qualifier: Diagnosis of  By: Larose Kells MD, [place] GERD 07/21/2008   Qualifier: Diagnosis of  By: Larose Kells MD, [place] GERD (gastroesophageal reflux disease)   .  Glaucoma and macular degeneration 12/31/2011  . Hyperlipidemia   . Hypertension   . Inguinal hernia 12/31/2011  . Insomnia 02/22/2018  . Ischemic cardiomyopathy 06/23/2018   Last EF 30-44% by My[facility] Feb 2017  . Left inguinal hernia 06/30/2018  . Myocardial infarction ([place]) 1989   interior wall infarction  . PCP notes >>>>>>>>>>>>>>>>>>>> 07/17/2016  . Pneumonia 2019  . Pre-operative cardiovascular examination 12/31/2011  . Rhinitis 04/20/2008   Qualifier: Diagnosis of  By: Larose Kells MD, [place] Vertigo 06/20/2015    Patient Active Problem List   Diagnosis Date Noted  . Chronic bilateral low back pain without sciatica 09/03/2020  . Pulmonary fibrosis ([place]) 10/29/2019  . Epidermoid cyst of skin of chest 09/29/2018  . Left inguinal hernia 06/30/2018  . At risk for pulmonary fibrosis 06/25/2018  . Ischemic cardiomyopathy 06/23/2018  . Insomnia 02/22/2018  . PCP notes >>>>>>>>>>>>>>>>>>>> 07/17/2016  . Vertigo 06/20/2015  . Chronic systolic CHF (congestive heart failure) ([place]) 12/17/2013  . Eczema 07/09/2013  . Annual physical exam 12/31/2011  . Inguinal hernia 12/31/2011  . Glaucoma and macular degeneration 12/31/2011  . DEGENERATIVE JOINT DISEASE 03/17/2010  . GERD 07/21/2008  . Rhinitis 04/20/2008  . ERECTILE DYSFUNCTION 12/09/2007  . Coronary atherosclerosis 06/09/2007  . Dyslipidemia 05/06/2007  . Essential hypertension 04/30/2007    Past Surgical History:  Procedure Laterality Date  . AAA repair  2003  . CARDIAC CATHETERIZATION    . CHOLECYSTECTOMY    . INGUINAL HERNIA REPAIR Right 1985  . INGUINAL HERNIA REPAIR Left 06/30/2018   open; w/mesh  . INGUINAL HERNIA REPAIR Left 06/30/2018   Procedure: OPEN LEFT INGUINAL HERNIA REPAIR;  Surgeon: Fanny Skates, MD;  Location: [facility];  Service: General;  Laterality: Left;  . INSERTION OF MESH Left 06/30/2018   Procedure: INSERTION OF MESH;  Surgeon: Fanny Skates, MD;  Location: [facility];  Service: General;  Laterality: Left;  [place] Kitchen MASS  EXCISION N/A 09/29/2018   Procedure: EXCISION OF 3 CM MASS ANTERIOR CHEST WALL;  Surgeon: Fanny Skates, MD;  Location: [facility];  Service: General;  Laterality: N/A;  . PTCA  1989, 1992       Family History  Problem Relation Age of Onset  . Lung cancer Father   . CAD Other        mother?  . Kidney Stones Son   . Prostate cancer Neg Hx   . Diabetes Neg Hx   . Colon cancer Neg Hx     Social History   Tobacco Use  . Smoking status: Former Research scientist (life sciences)  . Smokeless tobacco: Never Used  . Tobacco comment: quit in 1953  Vaping Use  . Vaping Use: Never used  Substance Use Topics  . Alcohol use: No  . Drug use: No    Home Medications Prior to Admission medications   Medication Sig Start Date End Date Taking? Authorizing Provider  albuterol (VENTOLIN HFA) 108 (90 Base) MCG/ACT inhaler Inhale 2 puffs into the lungs every 6 (six) hours as needed for wheezing or shortness of breath. 06/13/20   Colon Branch, MD  amLODipine (NORVASC) 2.5 MG tablet Take 1 tablet (2.5 mg total) by mouth daily. 10/29/16   Dorothy Spark, MD  aspirin 81 MG chewable tablet Chew 81 mg by mouth daily.     [provider]  atenolol (TENORMIN) 50 MG tablet Take 1 tablet (50 mg total) by mouth daily. 07/01/18   Fanny Skates, MD  azelastine (ASTELIN) 0.1 % nasal spray Place 2 sprays into both nostrils 2 (two) times daily. Use in each nostril as directed 06/28/17   Colon Branch, MD  Dorzolamide HCl-Timolol Mal PF 22.3-6.8 MG/ML SOLN Place 1 drop into both eyes 2 (two) times daily.     [provider]  fenofibrate (TRICOR) 48 MG tablet Take 48 mg by mouth daily.    [provider]  HYDROcodone-acetaminophen [facility]) 5-325 MG tablet 0.5-1 tablet po bid prn pain 09/07/20   [name] E, MD  ipratropium (ATROVENT) 0.06 % nasal spray Place 2 sprays into both nostrils 4 (four) times daily. 07/29/19   Colon Branch, MD  isosorbide mononitrate (IMDUR) 30 MG 24 hr tablet Take 0.5 tablets  (15 mg total) by mouth daily. 09/14/20   Richardson Dopp T, PA-C  meclizine (ANTIVERT) 25 MG tablet Take 1 tablet (25 mg total) by mouth 2 (two) times daily as needed for dizziness. 06/20/15   Colon Branch, MD  MELATONIN PO Take 1 tablet by mouth at bedtime as needed (sleep).     [provider]  Multiple Vitamin (MULTIVITAMIN) capsule Take 1 capsule by mouth daily.      [provider]  Multiple Vitamins-Minerals (ICAPS AREDS FORMULA PO) Take 1 capsule by mouth daily.    [provider]  nitroGLYCERIN (NITROSTAT) 0.4 MG SL tablet Place 1 tablet (0.4 mg total) under the tongue every  5 (five) minutes as needed for chest pain. 09/14/20   Richardson Dopp T, PA-C  pantoprazole (PROTONIX) 40 MG tablet Take 1 tablet (40 mg total) by mouth daily. 08/31/20   Colon Branch, MD  simvastatin (ZOCOR) 10 MG tablet Take 1 tablet (10 mg total) by mouth at bedtime. 05/14/12   Colon Branch, MD  tamsulosin (FLOMAX) 0.4 MG CAPS capsule Take 1 capsule (0.4 mg total) by mouth daily after supper. 08/31/20   Colon Branch, MD    Allergies    Ace inhibitors, Aspirin, and Spironolactone  Review of Systems   Review of Systems All other review of systems negative as per HPI   Physical Exam Updated Vital Signs BP 134/74 (BP Location: Left Arm)   Pulse 75   Temp 98.4 F (36.9 C) (Oral)   Resp 20   Ht 5\' 7"  (1.702 m)   Wt 68.5 kg   SpO2 94% Comment: on RA  BMI 23.65 kg/m   Physical Exam Vitals and nursing note reviewed.  Constitutional:      General: He is not in acute distress.    Appearance: He is well-developed. He is not ill-appearing, toxic-appearing or diaphoretic.  HENT:     Head: Normocephalic and atraumatic.     Mouth/Throat:     Mouth: Mucous membranes are moist.     Pharynx: Oropharynx is clear.  Eyes:     Conjunctiva/sclera: Conjunctivae normal.     Pupils: Pupils are equal, round, and reactive to light.  Cardiovascular:     Rate and Rhythm: Normal rate and regular rhythm.      Pulses: Normal pulses.     Heart sounds: Normal heart sounds. No murmur heard.   Pulmonary:     Effort: Pulmonary effort is normal. No respiratory distress.     Breath sounds: Normal breath sounds.  Abdominal:     General: Abdomen is flat.     Palpations: Abdomen is soft.     Tenderness: There is no abdominal tenderness.  Musculoskeletal:        General: No swelling, tenderness, deformity or signs of injury.     Cervical back: Neck supple.  Skin:    General: Skin is warm and dry.     Capillary Refill: Capillary refill takes less than 2 seconds.     Findings: No erythema.  Neurological:     General: No focal deficit present.     Mental Status: He is alert and oriented to person, place, and time.     Sensory: No sensory deficit.     Motor: No weakness.     ED Results / Procedures / Treatments   Labs (all labs ordered are listed, but only abnormal results are displayed) Labs Reviewed  CBC WITH DIFFERENTIAL/PLATELET - Abnormal; Notable for the following components:      Result Value   RBC 4.07 (*)    Hemoglobin 12.7 (*)    All other components within normal limits  COMPREHENSIVE METABOLIC PANEL - Abnormal; Notable for the following components:   Sodium 134 (*)    Glucose, Bld 121 (*)    Calcium 8.8 (*)    Total Protein 6.2 (*)    Albumin 3.3 (*)    All other components within normal limits  URINALYSIS, ROUTINE W REFLEX MICROSCOPIC - Abnormal; Notable for the following components:   APPearance CLOUDY (*)    All other components within normal limits  BRAIN NATRIURETIC PEPTIDE - Abnormal; Notable for the following components:   B Natriuretic  Peptide 389.3 (*)    All other components within normal limits  RESPIRATORY PANEL BY RT PCR (FLU A&B, COVID)  TROPONIN I (HIGH SENSITIVITY)  TROPONIN I (HIGH SENSITIVITY)    EKG EKG Interpretation  Date/Time:  Friday September 16 2020 12:13:08 EDT Ventricular Rate:  72 PR Interval:    QRS Duration: 121 QT Interval:  385 QTC  Calculation: 422 R Axis:   49 Text Interpretation: Sinus arrhythmia LVH with IVCD and secondary repol abnrm Anterolateral infarct, age indeterminate When comapred to prior, similar apperance. No STEMI Confirmed by Antony Blackbird [phone]) on 09/16/2020 12:16:29 PM Also confirmed by Antony Blackbird [phone]), editor Hattie Perch [phone])  on 09/16/2020 2:41:55 PM   Radiology DG Chest Portable 1 View  Result Date: 09/16/2020 CLINICAL DATA:  Cough EXAM: PORTABLE CHEST 1 VIEW COMPARISON:  Apr 14, 2019 FINDINGS: There is fibrotic type change in the lung bases and upper lung regions. There is no edema or airspace opacity. Heart is mildly enlarged with pulmonary vascularity normal. There is aortic atherosclerosis. No adenopathy. There is degenerative change in each shoulder. There is superior migration of the humeral heads. IMPRESSION: Fibrotic type change in the bases upper lung regions. No edema or consolidation evident. Stable cardiac prominence. No adenopathy. Aortic Atherosclerosis (ICD10-I70.0). Arthropathy in each shoulder with superior migration of each humeral head, a finding likely indicative of chronic rotator cuff tears. Electronically Signed   By: Lowella Grip III M.D.   On: 09/16/2020 12:31    Procedures Procedures (including critical care time)  Medications Ordered in ED Medications - No data to display  ED Course  I have reviewed the triage vital signs and the nursing notes.  Pertinent labs & imaging results that were available during my care of the patient were reviewed by me and considered in my medical decision making (see chart for details).    MDM Rules/Calculators/A&P                          86 year old male with sinus congestion, productive cough, muscle aches and change in taste for several days Presentation, he is alert, oriented, nontoxic-appearing, no acute distress, resting comfortably in the ER bed, speaking full sentences without increased work of breathing.   Vitals overall reassuring on arrival,  no evidence of hypoxia.  Exam with clear lung sounds, soft nontender abdomen, no lower lower extremity swelling noted.  DDx includes Covid/flu, pneumonia, PE URI, heart failure exacerbation  Labs personally reviewed and interpreted by me -CBC without leukocytosis or any other significant abnormalities -CMP with mild hyponatremia, hypocalcemia but no other significant abnormalities. -BNP of 389, no other values to compare this to -Covid and flu test are negative  Imaging reviewed and interpreted by me -X-ray with some chronic fibrotic changes but no evidence of pneumonia or fluid overload  EKG reviewed and interpreted by Dr. Sherry Ruffing - No significant changes   MDM: Work-up without evidence of pneumonia or other infection.  His BNP does appear to be elevated, however there are no prior tests to compare this to.  He does not appear to be fluid overload on exam or chest x-ray.  However nursing staff attempted to ambulate the patient and he became hypoxic on room air at 86%.  Consulted hospitalist team Dr. Posey Pronto who will admit the patient for further management. PE study ordered per their request  Pt seen and evaluated by Dr.Tegeler who agrees with the above plan and disposition   Final Clinical Impression(s) /  ED Diagnoses Final diagnoses:  Hypoxia  Elevated brain natriuretic peptide (BNP) level    Rx / DC Orders ED Discharge Orders    None       Garald Balding, PA-C 09/16/20 1523    Tegeler, Gwenyth Allegra, MD 09/16/20 [phone]

## 2020-09-16 NOTE — ED Notes (Signed)
Pt resting pulse was 84, during assisted ambulation with a walker pt's pulse was 92.

## 2020-09-16 NOTE — ED Notes (Signed)
Pt up to bedside for urinal use. O2 sats at 86%, placed on O2 2L [place].

## 2020-09-16 NOTE — H&P (Addendum)
History and Physical    Christopher Hogan JQB:[id number] DOB: 15-Sep-1925 DOA: 09/16/2020  PCP: Colon Branch, MD    Patient coming from: Home   Chief Complaint:  SOB   HPI: Christopher Hogan is a 86 y.o. male with medical history significant of AAA,CAd,?IPF,[place] ed for worsening SOB and runny nose since past ten days. PCPC saw him as well but pt does not know what his diagnosis was.Today he was very fatigued and tired and so he came to er.   ED Course:  Blood pressure 131/73, pulse 73, temperature 98.4 F (36.9 C), temperature source Oral, resp. rate (!) 26, height 5\' 7"  (1.702 m), weight 68.5 kg, SpO2 99 %. SpO2: 99 % Labs shows cbc with c/h anemia with b of 12, and platelet count of 143. and CMP shows  Mild hyponatremia of 134 and normal serum creatinine.  Pt is covid negative however suspect this may be covid. We will continue isolation and and airborne precaution. .   Review of Systems: As per HPI otherwise all systems reviewed and negative.  Past Medical History:  Diagnosis Date  . AAA (abdominal aortic aneurysm) ([place])    repaired 02/2002, renal ultrasound, Sept 2010: normal abdominal aorta, normal renal arteries   . Age-related macular degeneration, wet, left eye ([place])   . Allergic rhinitis   . Arthritis   . At risk for pulmonary fibrosis 06/25/2018  . CAD (coronary artery disease)    stable stress test 7/11  . CHF (congestive heart failure) ([place])   . Chronic systolic CHF (congestive heart failure) ([place]) 12/17/2013  . Coronary atherosclerosis 06/09/2007   H/O inferior wall MI- [facility]- 1989 Last stress test 2/17- inferior scar, no ischemia    . DEGENERATIVE JOINT DISEASE 03/17/2010   Qualifier: Diagnosis of  By: Dawson Bills    . Dyslipidemia 05/06/2007   LDL 59 Jan 2019     . Eczema 07/09/2013  . ED (erectile dysfunction)   . Epidermoid cyst of skin of chest 09/29/2018  . ERECTILE DYSFUNCTION 12/09/2007   Qualifier: Diagnosis of  By: Larose Kells MD, [place]  hypertension 04/30/2007   Qualifier: Diagnosis of  By: Larose Kells MD, [place] GERD 07/21/2008   Qualifier: Diagnosis of  By: Larose Kells MD, [place] GERD (gastroesophageal reflux disease)   . Glaucoma and macular degeneration 12/31/2011  . Hyperlipidemia   . Hypertension   . Inguinal hernia 12/31/2011  . Insomnia 02/22/2018  . Ischemic cardiomyopathy 06/23/2018   Last EF 30-44% by Myoview Feb 2017  . Left inguinal hernia 06/30/2018  . Myocardial infarction ([place]) 1989   interior wall infarction  . PCP notes >>>>>>>>>>>>>>>>>>>> 07/17/2016  . Pneumonia 2019  . Pre-operative cardiovascular examination 12/31/2011  . Rhinitis 04/20/2008   Qualifier: Diagnosis of  By: Larose Kells MD, [place] Vertigo 06/20/2015    Past Surgical History:  Procedure Laterality Date  . AAA repair  2003  . CARDIAC CATHETERIZATION    . CHOLECYSTECTOMY    . INGUINAL HERNIA REPAIR Right 1985  . INGUINAL HERNIA REPAIR Left 06/30/2018   open; w/mesh  . INGUINAL HERNIA REPAIR Left 06/30/2018   Procedure: OPEN LEFT INGUINAL HERNIA REPAIR;  Surgeon: Fanny Skates, MD;  Location: [facility];  Service: General;  Laterality: Left;  . INSERTION OF MESH Left 06/30/2018   Procedure: INSERTION OF MESH;  Surgeon: Fanny Skates, MD;  Location: [facility];  Service: General;  Laterality: Left;  [place] Kitchen MASS  EXCISION N/A 09/29/2018   Procedure: EXCISION OF 3 CM MASS ANTERIOR CHEST WALL;  Surgeon: Fanny Skates, MD;  Location: [place] [place];  Service: General;  Laterality: N/A;  . [place]     reports that he has quit smoking. He has never used smokeless tobacco. He reports that he does not drink alcohol and does not use drugs.  Allergies  Allergen Reactions  . Ace Inhibitors Cough  . Aspirin Nausea Only and Other (See Comments)    Full strength only  . Spironolactone Rash    Family History  Problem Relation Age of Onset  . Lung cancer Father   . CAD Other        mother?  . Kidney Stones Son   . Prostate cancer Neg Hx   .  Diabetes Neg Hx   . Colon cancer Neg Hx     Prior to Admission medications   Medication Sig Start Date End Date Taking? Authorizing Provider  albuterol (VENTOLIN HFA) 108 (90 Base) MCG/ACT inhaler Inhale 2 puffs into the lungs every 6 (six) hours as needed for wheezing or shortness of breath. 06/13/20   Colon Branch, MD  amLODipine (NORVASC) 2.5 MG tablet Take 1 tablet (2.5 mg total) by mouth daily. 10/29/16   Dorothy Spark, MD  aspirin 81 MG chewable tablet Chew 81 mg by mouth daily.     [provider]  atenolol (TENORMIN) 50 MG tablet Take 1 tablet (50 mg total) by mouth daily. 07/01/18   Fanny Skates, MD  azelastine (ASTELIN) 0.1 % nasal spray Place 2 sprays into both nostrils 2 (two) times daily. Use in each nostril as directed 06/28/17   Colon Branch, MD  Dorzolamide HCl-Timolol Mal PF 22.3-6.8 MG/ML SOLN Place 1 drop into both eyes 2 (two) times daily.     [provider]  fenofibrate (TRICOR) 48 MG tablet Take 48 mg by mouth daily.    [provider]  HYDROcodone-acetaminophen [facility]) 5-325 MG tablet 0.5-1 tablet po bid prn pain 09/07/20   [name] E, MD  ipratropium (ATROVENT) 0.06 % nasal spray Place 2 sprays into both nostrils 4 (four) times daily. 07/29/19   Colon Branch, MD  isosorbide mononitrate (IMDUR) 30 MG 24 hr tablet Take 0.5 tablets (15 mg total) by mouth daily. 09/14/20   Richardson Dopp T, PA-C  meclizine (ANTIVERT) 25 MG tablet Take 1 tablet (25 mg total) by mouth 2 (two) times daily as needed for dizziness. 06/20/15   Colon Branch, MD  MELATONIN PO Take 1 tablet by mouth at bedtime as needed (sleep).     [provider]  Multiple Vitamin (MULTIVITAMIN) capsule Take 1 capsule by mouth daily.      [provider]  Multiple Vitamins-Minerals (ICAPS AREDS FORMULA PO) Take 1 capsule by mouth daily.    [provider]  nitroGLYCERIN (NITROSTAT) 0.4 MG SL tablet Place 1 tablet (0.4 mg total) under the tongue every 5 (five)  minutes as needed for chest pain. 09/14/20   Richardson Dopp T, PA-C  pantoprazole (PROTONIX) 40 MG tablet Take 1 tablet (40 mg total) by mouth daily. 08/31/20   Colon Branch, MD  simvastatin (ZOCOR) 10 MG tablet Take 1 tablet (10 mg total) by mouth at bedtime. 05/14/12   Colon Branch, MD  tamsulosin (FLOMAX) 0.4 MG CAPS capsule Take 1 capsule (0.4 mg total) by mouth daily after supper. 08/31/20   Colon Branch, MD    Physical  Exam: Vitals:   09/16/20 1205 09/16/20 1208 09/16/20 1500  BP: 134/74  131/73  Pulse: 75  73  Resp: 20  (!) 26  Temp: 98.4 F (36.9 C)    TempSrc: Oral    SpO2: 94%  99%  Weight:  68.5 kg   Height:  5\' 7"  (1.702 m)     Constitutional: NAD, calm, comfortable Vitals:   09/16/20 1205 09/16/20 1208 09/16/20 1500  BP: 134/74  131/73  Pulse: 75  73  Resp: 20  (!) 26  Temp: 98.4 F (36.9 C)    TempSrc: Oral    SpO2: 94%  99%  Weight:  68.5 kg   Height:  5\' 7"  (1.702 m)    Eyes: PERRL, EOMI lids and conjunctivae normal ENMT: Mucous membranes are moist. Posterior pharynx clear of any exudate or lesions.Normal dentition.  Neck: normal, supple, no masses, no thyromegaly, no carotid bruit  Respiratory: Bl crackles on both sides posteriorly.  Cardiovascular: Regular rate and rhythm, no murmurs / rubs / gallops. No extremity edema. 2+ pedal pulses. No carotid bruits.  Abdomen: no tenderness, no masses palpated. No hepatosplenomegaly. Bowel sounds positive.  Musculoskeletal: no clubbing / cyanosis. No joint deformity upper and lower extremities. Pt moving all four ext , no contractures. Normal muscle tone.  Skin: no rashes, lesions, ulcers. No induration Neurologic: CN 2-12 grossly intact. Sensation intact, DTR normal. Strength 5/5 in all 4.  Psychiatric: Normal judgment and insight. Alert and oriented x 3. Normal mood.   Labs on Admission: I have personally reviewed following labs and imaging studies  CBC: Recent Labs  Lab 09/16/20 1216  WBC 8.1  NEUTROABS 6.2   HGB 12.7*  HCT 39.7  MCV 97.5  PLT 737   Basic Metabolic Panel: Recent Labs  Lab 09/16/20 1216 09/16/20 1545  NA 134*  --   K 4.1  --   CL 100  --   CO2 24  --   GLUCOSE 121*  --   BUN 11  --   CREATININE 0.96  --   CALCIUM 8.8*  --   MG  --  1.9  PHOS  --  3.3   GFR: Estimated Creatinine Clearance: 45.9 mL/min (by C-G formula based on SCr of 0.96 mg/dL). Liver Function Tests: Recent Labs  Lab 09/16/20 1216  AST 38  ALT 34  ALKPHOS 59  BILITOT 1.1  PROT 6.2*  ALBUMIN 3.3*   No results for input(s): LIPASE, AMYLASE in the last 168 hours. No results for input(s): AMMONIA in the last 168 hours. Coagulation Profile: No results for input(s): INR, PROTIME in the last 168 hours. Cardiac Enzymes: No results for input(s): CKTOTAL, CKMB, CKMBINDEX, TROPONINI in the last 168 hours. BNP (last 3 results) No results for input(s): PROBNP in the last 8760 hours. HbA1C: No results for input(s): HGBA1C in the last 72 hours. CBG: No results for input(s): GLUCAP in the last 168 hours. Lipid Profile: No results for input(s): CHOL, HDL, LDLCALC, TRIG, CHOLHDL, LDLDIRECT in the last 72 hours. Thyroid Function Tests: No results for input(s): TSH, T4TOTAL, FREET4, T3FREE, THYROIDAB in the last 72 hours. Anemia Panel: No results for input(s): VITAMINB12, FOLATE, FERRITIN, TIBC, IRON, RETICCTPCT in the last 72 hours. Urine analysis:    Component Value Date/Time   COLORURINE YELLOW 09/16/2020 1207   APPEARANCEUR CLOUDY (A) 09/16/2020 1207   LABSPEC 1.016 09/16/2020 1207   PHURINE 8.0 09/16/2020 [place] 09/16/2020 [place] 09/16/2020 1207   BILIRUBINUR  NEGATIVE 09/16/2020 [place] 09/16/2020 [place] 09/16/2020 [place] 09/16/2020 [place] 09/16/2020 1207   No intake or output data in the 24 hours ending 09/16/20 1648 Lab Results  Component Value Date   CREATININE 0.96  09/16/2020   CREATININE 0.96 04/27/2020   CREATININE 1.05 10/28/2019    COVID-19 Labs  Recent Labs    09/16/20 1545  DDIMER 3.46*  LDH 200*    Lab Results  Component Value Date   SARSCOV2NAA NEGATIVE 09/16/2020   [place] Not Detected 06/08/2020    Radiological Exams on Admission: DG Chest Portable 1 View  Result Date: 09/16/2020 CLINICAL DATA:  Cough EXAM: PORTABLE CHEST 1 VIEW COMPARISON:  Apr 14, 2019 FINDINGS: There is fibrotic type change in the lung bases and upper lung regions. There is no edema or airspace opacity. Heart is mildly enlarged with pulmonary vascularity normal. There is aortic atherosclerosis. No adenopathy. There is degenerative change in each shoulder. There is superior migration of the humeral heads. IMPRESSION: Fibrotic type change in the bases upper lung regions. No edema or consolidation evident. Stable cardiac prominence. No adenopathy. Aortic Atherosclerosis (ICD10-I70.0). Arthropathy in each shoulder with superior migration of each humeral head, a finding likely indicative of chronic rotator cuff tears. Electronically Signed   By: Lowella Grip III M.D.   On: 09/16/2020 12:31    EKG: Independently reviewed. Sinus arrhythmia at 72 with LVH.  Assessment/Plan Principal Problem:   Respiratory failure with hypoxia (HCC) Active Problems:   Essential hypertension   Coronary atherosclerosis   Acute Respiratory failure with hypoxia.  Pt is covid negative however is suspect he has covid-19 as his covid labs are elevated. We will admit pt to cardiac telemetry unit under isolation. We will start pt on vit c/zn and steroids.  Remdesivir if all markers are elevated.  COVID-19 Labs  Recent Labs    09/16/20 1545  DDIMER 3.46*  FERRITIN 182  LDH 200*  CRP 4.4*    Lab Results  Component Value Date   SARSCOV2NAA NEGATIVE 09/16/2020   [place] Not Detected 06/08/2020   We have requested CTA of chest to identify VTE.  Essential HTN: We  will continue patient Imdur 15 mg, atenolol 50 mg, amlodipine 2.5.  CAD: Patient has an extensive history of heart disease in the past, we will obtain a 2D echocardiogram in the hospital.  Continue home regimen of atenolol 50, statin therapy, aspirin 81. Diuretic therapy once Echo is done for ef assessment. SpO2: 99 %   DVT prophylaxis:  Heparin.   Code Status:  Full Code.   Family Communication:  None at bedside.   Disposition Plan:  Home   Consults called:  None  Admission status: Inpatient.    Para Skeans MD [facility] Pager [phone] If 8PM-7AM, please contact night-coverage [email or web address] Password [facility] 09/16/2020, 4:48 PM

## 2020-09-17 ENCOUNTER — Inpatient Hospital Stay (HOSPITAL_COMMUNITY): Payer: Medicare Other

## 2020-09-17 DIAGNOSIS — R0602 Shortness of breath: Secondary | ICD-10-CM | POA: Diagnosis not present

## 2020-09-17 DIAGNOSIS — I5031 Acute diastolic (congestive) heart failure: Secondary | ICD-10-CM

## 2020-09-17 DIAGNOSIS — I951 Orthostatic hypotension: Secondary | ICD-10-CM

## 2020-09-17 DIAGNOSIS — R9389 Abnormal findings on diagnostic imaging of other specified body structures: Secondary | ICD-10-CM

## 2020-09-17 DIAGNOSIS — I5022 Chronic systolic (congestive) heart failure: Secondary | ICD-10-CM

## 2020-09-17 DIAGNOSIS — R7989 Other specified abnormal findings of blood chemistry: Secondary | ICD-10-CM | POA: Diagnosis not present

## 2020-09-17 DIAGNOSIS — J189 Pneumonia, unspecified organism: Secondary | ICD-10-CM

## 2020-09-17 LAB — COMPREHENSIVE METABOLIC PANEL
ALT: 39 U/L (ref 0–44)
AST: 39 U/L (ref 15–41)
Albumin: 3.2 g/dL — ABNORMAL LOW (ref 3.5–5.0)
Alkaline Phosphatase: 62 U/L (ref 38–126)
Anion gap: 10 (ref 5–15)
BUN: 12 mg/dL (ref 8–23)
CO2: 24 mmol/L (ref 22–32)
Calcium: 8.9 mg/dL (ref 8.9–10.3)
Chloride: 101 mmol/L (ref 98–111)
Creatinine, Ser: 1 mg/dL (ref 0.61–1.24)
GFR, Estimated: >60 mL/min (ref >60)
Glucose, Bld: 150 mg/dL — ABNORMAL HIGH (ref 70–99)
Potassium: 4.9 mmol/L (ref 3.5–5.1)
Sodium: 135 mmol/L (ref 135–145)
Total Bilirubin: 1.2 mg/dL (ref 0.3–1.2)
Total Protein: 6 g/dL — ABNORMAL LOW (ref 6.5–8.1)

## 2020-09-17 LAB — RESPIRATORY PANEL BY PCR

## 2020-09-17 LAB — CBC WITH DIFFERENTIAL/PLATELET
Abs Immature Granulocytes: 0.08 10*3/uL — ABNORMAL HIGH (ref 0.00–0.07)
Basophils Absolute: 0 10*3/uL (ref 0.0–0.1)
Basophils Relative: 0 %
Eosinophils Absolute: 0 10*3/uL (ref 0.0–0.5)
Eosinophils Relative: 0 %
HCT: 37.6 % — ABNORMAL LOW (ref 39.0–52.0)
Hemoglobin: 12.5 g/dL — ABNORMAL LOW (ref 13.0–17.0)
Immature Granulocytes: 1 %
Lymphocytes Relative: 7 %
Lymphs Abs: 0.5 10*3/uL — ABNORMAL LOW (ref 0.7–4.0)
MCH: 31.8 pg (ref 26.0–34.0)
MCHC: 33.2 g/dL (ref 30.0–36.0)
MCV: 95.7 fL (ref 80.0–100.0)
Monocytes Absolute: 0.1 10*3/uL (ref 0.1–1.0)
Monocytes Relative: 2 %
Neutro Abs: 6.5 10*3/uL (ref 1.7–7.7)
Neutrophils Relative %: 90 %
Platelets: 228 10*3/uL (ref 150–400)
RBC: 3.93 MIL/uL — ABNORMAL LOW (ref 4.22–5.81)
RDW: 14.2 % (ref 11.5–15.5)
WBC: 7.2 10*3/uL (ref 4.0–10.5)
nRBC: 0 % (ref 0.0–0.2)

## 2020-09-17 LAB — PROCALCITONIN: Procalcitonin: <0.1 ng/mL

## 2020-09-17 LAB — CK: Total CK: 58 U/L (ref 49–397)

## 2020-09-17 LAB — ECHOCARDIOGRAM COMPLETE
AR max vel: 2 cm2
AV Area VTI: 1.99 cm2
AV Area mean vel: 1.76 cm2
AV Mean grad: 4 mmHg
AV Peak grad: 6.2 mmHg
Ao pk vel: 1.24 m/s
Calc EF: 33 %
Height: 67 in
P 1/2 time: 795 msec
S' Lateral: 4.9 cm
Single Plane A2C EF: 32 %
Single Plane A4C EF: 34.2 %
Weight: 2172.85 oz

## 2020-09-17 LAB — PHOSPHORUS: Phosphorus: 3.7 mg/dL (ref 2.5–4.6)

## 2020-09-17 LAB — FERRITIN: Ferritin: 224 ng/mL (ref 24–336)

## 2020-09-17 LAB — MAGNESIUM: Magnesium: 1.9 mg/dL (ref 1.7–2.4)

## 2020-09-17 LAB — D-DIMER, QUANTITATIVE: D-Dimer, Quant: 3.42 ug/mL-FEU — ABNORMAL HIGH (ref 0.00–0.50)

## 2020-09-17 LAB — C-REACTIVE PROTEIN: CRP: 5.4 mg/dL — ABNORMAL HIGH (ref <1.0)

## 2020-09-17 MED ORDER — IPRATROPIUM-ALBUTEROL 20-100 MCG/ACT IN AERS
1.0000 | INHALATION_SPRAY | Freq: Four times a day (QID) | RESPIRATORY_TRACT | Status: DC | PRN
  Filled 2020-09-17: qty 4

## 2020-09-17 MED ORDER — POLYETHYLENE GLYCOL 3350 17 G PO PACK
17.0000 g | PACK | Freq: Two times a day (BID) | ORAL | Status: DC | PRN
  Administered 2020-09-18 – 2020-09-19 (×2): 17 g via ORAL
  Filled 2020-09-17 (×2): qty 1

## 2020-09-17 MED ORDER — LORATADINE 10 MG PO TABS
10.0000 mg | ORAL_TABLET | Freq: Every day | ORAL | Status: DC
  Administered 2020-09-18 – 2020-09-20 (×3): 10 mg via ORAL
  Filled 2020-09-17 (×2): qty 1

## 2020-09-17 MED ORDER — SENNOSIDES-DOCUSATE SODIUM 8.6-50 MG PO TABS
1.0000 | ORAL_TABLET | Freq: Two times a day (BID) | ORAL | Status: DC | PRN
  Administered 2020-09-17 – 2020-09-19 (×3): 1 via ORAL
  Filled 2020-09-17 (×3): qty 1

## 2020-09-17 MED ORDER — METOPROLOL SUCCINATE ER 50 MG PO TB24
50.0000 mg | ORAL_TABLET | Freq: Every day | ORAL | Status: DC
  Administered 2020-09-18 – 2020-09-20 (×3): 50 mg via ORAL
  Filled 2020-09-17 (×4): qty 1

## 2020-09-17 MED ORDER — FLUTICASONE PROPIONATE 50 MCG/ACT NA SUSP
1.0000 | Freq: Every day | NASAL | Status: DC
  Administered 2020-09-17 – 2020-09-20 (×4): 1 via NASAL
  Filled 2020-09-17: qty 16

## 2020-09-17 NOTE — Progress Notes (Signed)
PROGRESS NOTE  Christopher Hogan PRX:[id number] DOB: 06/26/1925   PCP: Colon Branch, MD  Patient is from: Home  DOA: 09/16/2020 LOS: 1  Chief complaints: Shortness of breath  Brief Narrative / Interim history: 86 year old male with PMH of CAD, pulmonary fibrosis?,  AAA, systolic CHF, HTN and postnasal drainage presenting with progressive dyspnea, fatigue and runny nose.    In ED, vital signs within normal except for elevated respiratory rate to mid and upper 20s.  94% on RA.  WBC 8.1.  Hgb 12.7. Na 134.  BNP 389.  Troponin negative x2.  D-dimer 3.46.  CTA chest negative for PE but multifocal pneumonia concerning for viral etiology.  Mildly elevated inflammatory markers including CRP and LDH as well.  COVID-19, influenza A and B and RSV negative x2.  Empirically started on azithromycin and Zosyn for community-acquired pneumonia and admitted for "hypoxemic respiratory failure".   The next day, procalcitonin negative.  Discontinued IV Zosyn.   Subjective: Seen and examined earlier this morning.  No major events overnight of this morning.  Reports some improvement in his breathing but felt lightheaded when he tries to get up.  He reports intermittent cough that he attributes to postnasal drainage.  He denies chest pain, GI or UTI symptoms.   Objective: Vitals:   09/17/20 0200 09/17/20 0400 09/17/20 0700 09/17/20 0831  BP: 116/66 111/62    Pulse: 64 64    Resp: (!) 25 (!) 25    Temp:  98.6 F (37 C) 97.7 F (36.5 C)   TempSrc:  Oral Oral   SpO2: 97% 98%  93%  Weight:      Height:       No intake or output data in the 24 hours ending 09/17/20 1149 Filed Weights   09/16/20 1208 09/17/20 0005  Weight: 68.5 kg 61.6 kg    Examination:  GENERAL: No apparent distress.  Nontoxic. HEENT: MMM.  Vision and hearing grossly intact.  NECK: Supple.  No apparent JVD.  RESP: 98% on 2 L.  No IWOB.  Bilateral crackles. CVS:  RRR. Heart sounds normal.  ABD/GI/GU: BS+. Abd soft, NTND.  MSK/EXT:   Moves extremities. No apparent deformity. No edema.  SKIN: no apparent skin lesion or wound NEURO: Awake, alert and oriented appropriately.  No apparent focal neuro deficit. PSYCH: Calm. Normal affect.  Procedures:  None  Microbiology summarized: COVID-19 PCR negative x2. Influenza A and B, and RSV negative x2.  Assessment & Plan: Dyspnea/cough: suspect viral pneumonia.  Has no leukocytosis, fever or significant procalcitonin to suggest bacterial etiology.  COVID-19 PCR, influenza and RSV negative x2.  CTA chest negative for PE but multifocal pneumonia consistent with viral etiology.  He has elevated BNP to 380s but no prior to compare to.  He does not appear fluid overloaded.  Some mention about risk for pulmonary fibrosis but not sure the basis of this.  He is tachypneic.  Has crackles on exam that goes along with CTA finding. -Discontinue IV Zosyn -Continue azithromycin and prednisone -Full panel RVP -Wean oxygen as able -Ambulate for oxygen saturation assessment  Chronic systolic CHF: No formal TEE result in his chart but EF of 30 to 40% by Myoview in 2017.  Slightly elevated BNP to 380s but appears euvolemic clinically.  Not on diuretics at home.  He seems to have orthostatic hypotension based on his description. -Monitor fluid status -May need appropriate GDMT if echocardiogram confirms sCHF -Check orthostatic vitals  Essential hypertension: Normotensive.  He reports orthostasis. -Check  orthostatic vitals -May have to adjust his cardiac medications based on orthostatic vitals.  Orthostasis-reports postural orthostasis.  She is on multiple antihypertensive medication which could contribute. -Check orthostatic vitals.  Elevated D-dimer: CTA chest negative for PE.  No signs of DVT. -Check lower extremity Doppler to exclude DVT  CAD/history of inferior wall MI: No ischemia on his stress test in 2017.  No chest pain. -Continue home medications  Postnasal drainage -Loratadine  and Flonase spray  Constipation -As needed MiraLAX and Senokot  GERD -PPI  BPH without LUTS -Continue home Flomax  Body mass index is 21.27 kg/m.         DVT prophylaxis:  heparin injection 5,000 Units Start: 09/16/20 1730 SCDs Start: 09/16/20 1713  Code Status: Full code Family Communication: Updated patient's wife over the phone. Status is: Inpatient   Remains inpatient appropriate because:Hemodynamically unstable, Unsafe d/c plan and Inpatient level of care appropriate due to severity of illness   Dispo: The patient is from: Home              Anticipated d/c is to: Home              Anticipated d/c date is: 1 day              Patient currently is not medically stable to d/c.       Consultants:  None   Sch Meds:  Scheduled Meds: . amLODipine  2.5 mg Oral Daily  . vitamin C  500 mg Oral Daily  . aspirin  81 mg Oral Daily  . atenolol  50 mg Oral Daily  . azelastine  2 spray Each Nare BID  . dorzolamide-timolol  1 drop Both Eyes BID  . fenofibrate  54 mg Oral Daily  . fluticasone  1 spray Each Nare Daily  . heparin  5,000 Units Subcutaneous Q8H  . Ipratropium-Albuterol  1 puff Inhalation Q6H  . isosorbide mononitrate  15 mg Oral Daily  . latanoprost  1 drop Right Eye QHS  . loratadine  10 mg Oral Daily  . methylPREDNISolone (SOLU-MEDROL) injection  1 mg/kg Intravenous Q12H   Followed by  . [START ON 09/19/2020] predniSONE  50 mg Oral Daily  . pantoprazole  40 mg Oral Daily  . simvastatin  10 mg Oral QHS  . tamsulosin  0.4 mg Oral QPC supper  . zinc sulfate  220 mg Oral Daily   Continuous Infusions: . azithromycin Stopped (09/16/20 2331)   PRN Meds:.albuterol, chlorpheniramine-HYDROcodone, guaiFENesin-dextromethorphan, HYDROcodone-acetaminophen, polyethylene glycol, senna-docusate  Antimicrobials: Anti-infectives (From admission, onward)   Start     Dose/Rate Route Frequency Ordered Stop   09/16/20 2200  piperacillin-tazobactam (ZOSYN) IVPB 3.375  g  Status:  Discontinued        3.375 g 12.5 mL/hr over 240 Minutes Intravenous Every 8 hours 09/16/20 2007 09/17/20 1100   09/16/20 2130  azithromycin (ZITHROMAX) 500 mg in sodium chloride 0.9 % 250 mL IVPB        500 mg 250 mL/hr over 60 Minutes Intravenous Every 24 hours 09/16/20 1933 09/21/20 2129       I have personally reviewed the following labs and images: CBC: Recent Labs  Lab 09/16/20 1216 09/17/20 0310  WBC 8.1 7.2  NEUTROABS 6.2 6.5  HGB 12.7* 12.5*  HCT 39.7 37.6*  MCV 97.5 95.7  PLT 264 228   BMP &GFR Recent Labs  Lab 09/16/20 1216 09/16/20 1545 09/17/20 0310  NA 134*  --  135  K 4.1  --  4.9  CL 100  --  101  CO2 24  --  24  GLUCOSE 121*  --  150*  BUN 11  --  12  CREATININE 0.96  --  1.00  CALCIUM 8.8*  --  8.9  MG  --  1.9 1.9  PHOS  --  3.3 3.7   Estimated Creatinine Clearance: 41.1 mL/min (by C-G formula based on SCr of 1 mg/dL). Liver & Pancreas: Recent Labs  Lab 09/16/20 1216 09/17/20 0310  AST 38 39  ALT 34 39  ALKPHOS 59 62  BILITOT 1.1 1.2  PROT 6.2* 6.0*  ALBUMIN 3.3* 3.2*   No results for input(s): LIPASE, AMYLASE in the last 168 hours. No results for input(s): AMMONIA in the last 168 hours. Diabetic: No results for input(s): HGBA1C in the last 72 hours. No results for input(s): GLUCAP in the last 168 hours. Cardiac Enzymes: Recent Labs  Lab 09/17/20 0310  CKTOTAL 58   No results for input(s): PROBNP in the last 8760 hours. Coagulation Profile: No results for input(s): INR, PROTIME in the last 168 hours. Thyroid Function Tests: Recent Labs    09/16/20 1545  TSH 2.903  FREET4 0.90   Lipid Profile: No results for input(s): CHOL, HDL, LDLCALC, TRIG, CHOLHDL, LDLDIRECT in the last 72 hours. Anemia Panel: Recent Labs    09/16/20 1545 09/17/20 0310  FERRITIN 182 224   Urine analysis:    Component Value Date/Time   COLORURINE YELLOW 09/16/2020 1207   APPEARANCEUR CLOUDY (A) 09/16/2020 1207   LABSPEC 1.016  09/16/2020 1207   PHURINE 8.0 09/16/2020 [place] 09/16/2020 1207   [place] 09/16/2020 1207   [place] 09/16/2020 [place] 09/16/2020 [place] 09/16/2020 1207   NITRITE NEGATIVE 09/16/2020 1207   LEUKOCYTESUR NEGATIVE 09/16/2020 1207   Sepsis Labs: Invalid input(s): PROCALCITONIN, [place]  Microbiology: Recent Results (from the past 240 hour(s))  Respiratory Panel by RT PCR (Flu A&B, Covid) - Nasopharyngeal Swab     Status: None   Collection Time: 09/16/20 12:16 PM   Specimen: Nasopharyngeal Swab  Result Value Ref Range Status   SARS Coronavirus 2 by RT PCR NEGATIVE NEGATIVE Final    Comment: (NOTE) SARS-CoV-2 target nucleic acids are NOT DETECTED.  The SARS-CoV-2 RNA is generally detectable in upper respiratoy specimens during the acute phase of infection. The lowest concentration of SARS-CoV-2 viral copies this assay can detect is 131 copies/mL. A negative result does not preclude SARS-Cov-2 infection and should not be used as the sole basis for treatment or other patient management decisions. A negative result may occur with  improper specimen collection/handling, submission of specimen other than nasopharyngeal swab, presence of viral mutation(s) within the areas targeted by this assay, and inadequate number of viral copies (<131 copies/mL). A negative result must be combined with clinical observations, patient history, and epidemiological information. The expected result is Negative.  Fact Sheet for Patients:  PinkCheek.be  Fact Sheet for Healthcare Providers:  GravelBags.it  This test is no t yet approved or cleared by the [place] FDA and  has been authorized for detection and/or diagnosis of SARS-CoV-2 by FDA under an Emergency Use Authorization (EUA). This EUA will remain  in effect (meaning this test can be used) for the duration  of the COVID-19 declaration under Section 564(b)(1) of the Act, 21 U.S.C. section 360bbb-3(b)(1), unless the authorization is terminated or revoked sooner.     Influenza A by PCR NEGATIVE NEGATIVE  Final   Influenza B by PCR NEGATIVE NEGATIVE Final    Comment: (NOTE) The Xpert Xpress SARS-CoV-2/FLU/RSV assay is intended as an aid in  the diagnosis of influenza from Nasopharyngeal swab specimens and  should not be used as a sole basis for treatment. Nasal washings and  aspirates are unacceptable for Xpert Xpress SARS-CoV-2/FLU/RSV  testing.  Fact Sheet for Patients: PinkCheek.be  Fact Sheet for Healthcare Providers: GravelBags.it  This test is not yet approved or cleared by the [place] FDA and  has been authorized for detection and/or diagnosis of SARS-CoV-2 by  FDA under an Emergency Use Authorization (EUA). This EUA will remain  in effect (meaning this test can be used) for the duration of the  Covid-19 declaration under Section 564(b)(1) of the Act, 21  U.S.C. section 360bbb-3(b)(1), unless the authorization is  terminated or revoked. Performed at [facility], [place] [address], [place], [place] [address]   Respiratory Panel by RT PCR (Flu A&B, Covid) - Nasopharyngeal Swab     Status: None   Collection Time: 09/16/20  7:44 PM   Specimen: Nasopharyngeal Swab  Result Value Ref Range Status   SARS Coronavirus 2 by RT PCR NEGATIVE NEGATIVE Final    Comment: (NOTE) SARS-CoV-2 target nucleic acids are NOT DETECTED.  The SARS-CoV-2 RNA is generally detectable in upper respiratoy specimens during the acute phase of infection. The lowest concentration of SARS-CoV-2 viral copies this assay can detect is 131 copies/mL. A negative result does not preclude SARS-Cov-2 infection and should not be used as the sole basis for treatment or other patient management decisions. A negative result may occur with  improper  specimen collection/handling, submission of specimen other than nasopharyngeal swab, presence of viral mutation(s) within the areas targeted by this assay, and inadequate number of viral copies (<131 copies/mL). A negative result must be combined with clinical observations, patient history, and epidemiological information. The expected result is Negative.  Fact Sheet for Patients:  PinkCheek.be  Fact Sheet for Healthcare Providers:  GravelBags.it  This test is no t yet approved or cleared by the [place] FDA and  has been authorized for detection and/or diagnosis of SARS-CoV-2 by FDA under an Emergency Use Authorization (EUA). This EUA will remain  in effect (meaning this test can be used) for the duration of the COVID-19 declaration under Section 564(b)(1) of the Act, 21 U.S.C. section 360bbb-3(b)(1), unless the authorization is terminated or revoked sooner.     Influenza A by PCR NEGATIVE NEGATIVE Final   Influenza B by PCR NEGATIVE NEGATIVE Final    Comment: (NOTE) The Xpert Xpress SARS-CoV-2/FLU/RSV assay is intended as an aid in  the diagnosis of influenza from Nasopharyngeal swab specimens and  should not be used as a sole basis for treatment. Nasal washings and  aspirates are unacceptable for Xpert Xpress SARS-CoV-2/FLU/RSV  testing.  Fact Sheet for Patients: PinkCheek.be  Fact Sheet for Healthcare Providers: GravelBags.it  This test is not yet approved or cleared by the [place] FDA and  has been authorized for detection and/or diagnosis of SARS-CoV-2 by  FDA under an Emergency Use Authorization (EUA). This EUA will remain  in effect (meaning this test can be used) for the duration of the  Covid-19 declaration under Section 564(b)(1) of the Act, 21  U.S.C. section 360bbb-3(b)(1), unless the authorization is  terminated or revoked. Performed at  [facility], [place] [address], [place], [place] [address]     Radiology Studies: CT Angio Chest PE  W and/or Wo Contrast  Result Date: 09/16/2020 CLINICAL DATA:  86 year old male with concern for pulmonary embolism. EXAM: CT ANGIOGRAPHY CHEST WITH CONTRAST TECHNIQUE: Multidetector CT imaging of the chest was performed using the standard protocol during bolus administration of intravenous contrast. Multiplanar CT image reconstructions and MIPs were obtained to evaluate the vascular anatomy. CONTRAST:  8mL OMNIPAQUE IOHEXOL 350 MG/ML SOLN COMPARISON:  Chest radiograph dated 09/16/2020. FINDINGS: Evaluation of this exam is limited due to respiratory motion artifact. Cardiovascular: There is mild cardiomegaly. No pericardial effusion. There is coronary vascular calcification of the LAD. Moderate atherosclerotic calcification of the thoracic aorta. Evaluation of the pulmonary arteries is limited due to respiratory motion artifact and suboptimal visualization of the peripheral branches. No large or central pulmonary artery embolus identified. Mediastinum/Nodes: There is no hilar or mediastinal adenopathy. The esophagus is grossly unremarkable. No mediastinal fluid collection. Lungs/Pleura: There is diffuse patchy ground-glass opacities throughout the lungs most consistent with multifocal pneumonia, likely viral or atypical in etiology including COVID-19. Clinical correlation and follow-up recommended. There is mild bronchiectatic changes. No pleural effusion or pneumothorax. The central airways are patent. Upper Abdomen: Cholecystectomy. Musculoskeletal: No chest wall abnormality. No acute or significant osseous findings. Review of the MIP images confirms the above findings. IMPRESSION: 1. No CT evidence of central pulmonary artery embolus. 2. Multifocal pneumonia, likely viral or atypical in etiology. Clinical correlation and follow-up recommended. 3. Aortic Atherosclerosis (ICD10-I70.0). Electronically  Signed   By: Anner Crete M.D.   On: 09/16/2020 19:18   DG Chest Portable 1 View  Result Date: 09/16/2020 CLINICAL DATA:  Cough EXAM: PORTABLE CHEST 1 VIEW COMPARISON:  Apr 14, 2019 FINDINGS: There is fibrotic type change in the lung bases and upper lung regions. There is no edema or airspace opacity. Heart is mildly enlarged with pulmonary vascularity normal. There is aortic atherosclerosis. No adenopathy. There is degenerative change in each shoulder. There is superior migration of the humeral heads. IMPRESSION: Fibrotic type change in the bases upper lung regions. No edema or consolidation evident. Stable cardiac prominence. No adenopathy. Aortic Atherosclerosis (ICD10-I70.0). Arthropathy in each shoulder with superior migration of each humeral head, a finding likely indicative of chronic rotator cuff tears. Electronically Signed   By: Lowella Grip III M.D.   On: 09/16/2020 12:31      Christopher Hogan  If 7PM-7AM, please contact night-coverage [email or web address] 09/17/2020, 11:49 AM

## 2020-09-17 NOTE — Progress Notes (Signed)
SATURATION QUALIFICATIONS: (This note is used to comply with regulatory documentation for home oxygen)  Patient Saturations on Room Air at Rest = 86%  Patient Saturations on Room Air while Ambulating = 82%  Patient Saturations on 4 Liters of oxygen while Ambulating = 88%  Please briefly explain why patient needs home oxygen: Patient oxygen saturation declines quickly during ambulation, patient having shortness of breath having to stop several times to catch his breath.

## 2020-09-17 NOTE — Plan of Care (Signed)

## 2020-09-17 NOTE — Progress Notes (Signed)
VASCULAR LAB    Bilateral lower extremity venous duplex has been performed.  See CV proc for preliminary results.   Moo Gravley, RVT 09/17/2020, 1:19 PM

## 2020-09-17 NOTE — Progress Notes (Signed)
  Echocardiogram 2D Echocardiogram has been performed.  Christopher Hogan 09/17/2020, 10:46 AM

## 2020-09-17 NOTE — Plan of Care (Signed)
  Problem: Education: Goal: Knowledge of General Education information will improve Description: Including pain rating scale, medication(s)/side effects and non-pharmacologic comfort measures Outcome: Progressing   Problem: Health Behavior/Discharge Planning: Goal: Ability to manage health-related needs will improve Outcome: Progressing   Problem: Clinical Measurements: Goal: Ability to maintain clinical measurements within normal limits will improve Outcome: Progressing Goal: Diagnostic test results will improve Outcome: Progressing   Problem: Nutrition: Goal: Adequate nutrition will be maintained Outcome: Progressing   

## 2020-09-18 DIAGNOSIS — J9601 Acute respiratory failure with hypoxia: Secondary | ICD-10-CM

## 2020-09-18 DIAGNOSIS — E871 Hypo-osmolality and hyponatremia: Secondary | ICD-10-CM

## 2020-09-18 DIAGNOSIS — D649 Anemia, unspecified: Secondary | ICD-10-CM

## 2020-09-18 LAB — RENAL FUNCTION PANEL
Albumin: 2.8 g/dL — ABNORMAL LOW (ref 3.5–5.0)
Anion gap: 9 (ref 5–15)
BUN: 17 mg/dL (ref 8–23)
CO2: 23 mmol/L (ref 22–32)
Calcium: 8.6 mg/dL — ABNORMAL LOW (ref 8.9–10.3)
Chloride: 100 mmol/L (ref 98–111)
Creatinine, Ser: 0.87 mg/dL (ref 0.61–1.24)
GFR, Estimated: >60 mL/min (ref >60)
Glucose, Bld: 128 mg/dL — ABNORMAL HIGH (ref 70–99)
Phosphorus: 2.9 mg/dL (ref 2.5–4.6)
Potassium: 4 mmol/L (ref 3.5–5.1)
Sodium: 132 mmol/L — ABNORMAL LOW (ref 135–145)

## 2020-09-18 LAB — PROCALCITONIN: Procalcitonin: <0.1 ng/mL

## 2020-09-18 LAB — CBC
HCT: 32.7 % — ABNORMAL LOW (ref 39.0–52.0)
Hemoglobin: 10.7 g/dL — ABNORMAL LOW (ref 13.0–17.0)
MCH: 30.8 pg (ref 26.0–34.0)
MCHC: 32.7 g/dL (ref 30.0–36.0)
MCV: 94.2 fL (ref 80.0–100.0)
Platelets: 249 10*3/uL (ref 150–400)
RBC: 3.47 MIL/uL — ABNORMAL LOW (ref 4.22–5.81)
RDW: 14.1 % (ref 11.5–15.5)
WBC: 8.2 10*3/uL (ref 4.0–10.5)
nRBC: 0 % (ref 0.0–0.2)

## 2020-09-18 LAB — FERRITIN: Ferritin: 246 ng/mL (ref 24–336)

## 2020-09-18 LAB — RETICULOCYTES
Immature Retic Fract: 24.8 % — ABNORMAL HIGH (ref 2.3–15.9)
RBC.: 4.1 MIL/uL — ABNORMAL LOW (ref 4.22–5.81)
Retic Count, Absolute: 124.6 10*3/uL (ref 19.0–186.0)
Retic Ct Pct: 3 % (ref 0.4–3.1)

## 2020-09-18 LAB — IRON AND TIBC
Iron: 84 ug/dL (ref 45–182)
Saturation Ratios: 36 % (ref 17.9–39.5)
TIBC: 231 ug/dL — ABNORMAL LOW (ref 250–450)
UIBC: 147 ug/dL

## 2020-09-18 LAB — SODIUM, URINE, RANDOM: Sodium, Ur: 40 mmol/L

## 2020-09-18 LAB — MAGNESIUM: Magnesium: 2 mg/dL (ref 1.7–2.4)

## 2020-09-18 LAB — FOLATE: Folate: 20.5 ng/mL (ref >5.9)

## 2020-09-18 LAB — TSH: TSH: 1.535 u[IU]/mL (ref 0.350–4.500)

## 2020-09-18 LAB — OSMOLALITY, URINE: Osmolality, Ur: 780 mOsm/kg (ref 300–900)

## 2020-09-18 LAB — VITAMIN B12: Vitamin B-12: 1141 pg/mL — ABNORMAL HIGH (ref 180–914)

## 2020-09-18 MED ORDER — FUROSEMIDE 10 MG/ML IJ SOLN
20.0000 mg | Freq: Once | INTRAMUSCULAR | Status: AC
  Administered 2020-09-18: 20 mg via INTRAVENOUS
  Filled 2020-09-18: qty 2

## 2020-09-18 MED ORDER — ALBUTEROL SULFATE HFA 108 (90 BASE) MCG/ACT IN AERS
2.0000 | INHALATION_SPRAY | Freq: Four times a day (QID) | RESPIRATORY_TRACT | Status: DC | PRN
  Filled 2020-09-18: qty 6.7

## 2020-09-18 NOTE — Consult Note (Incomplete)
NAME:  Christopher Hogan, MRN:  [id number], DOB:  1925/03/13, LOS: 2 ADMISSION DATE:  09/16/2020, CONSULTATION DATE:  10/31 REFERRING MD:  Cyndia Skeeters, CHIEF COMPLAINT:  Abnormal ct chest and exertional hypoxia    Brief History   ***  History of present illness   ***  Past Medical History  CAD, AAA, CHF, HTn,   Significant Hospital Events   ***  Consults:  ***  Procedures:  ***  Significant Diagnostic Tests:  ***  Micro Data:  ***  Antimicrobials:  ***   Interim history/subjective:  ***  Objective   Blood pressure 127/67, pulse 65, temperature 97.7 F (36.5 C), temperature source Oral, resp. rate 20, height 5\' 7"  (1.702 m), weight 61.6 kg, SpO2 95 %.        Intake/Output Summary (Last 24 hours) at 09/18/2020 1138 Last data filed at 09/18/2020 0130 Gross per 24 hour  Intake 489.7 ml  Output 800 ml  Net -310.3 ml   Filed Weights   09/16/20 1208 09/17/20 0005  Weight: 68.5 kg 61.6 kg    Examination: General: *** HENT: *** Lungs: *** Cardiovascular: *** Abdomen: *** Extremities: *** Neuro: *** GU: ***  Resolved Hospital Problem list   ***  Assessment & Plan:  ***  Best practice:  Diet: *** Pain/Anxiety/Delirium protocol (if indicated): *** VAP protocol (if indicated): *** DVT prophylaxis: *** GI prophylaxis: *** Glucose control: *** Mobility: *** Code Status: *** Family Communication: *** Disposition:   Labs   CBC: Recent Labs  Lab 09/16/20 1216 09/17/20 0310 09/18/20 0109  WBC 8.1 7.2 8.2  NEUTROABS 6.2 6.5  --   HGB 12.7* 12.5* 10.7*  HCT 39.7 37.6* 32.7*  MCV 97.5 95.7 94.2  PLT 264 228 606    Basic Metabolic Panel: Recent Labs  Lab 09/16/20 1216 09/16/20 1545 09/17/20 0310 09/18/20 0109  NA 134*  --  135 132*  K 4.1  --  4.9 4.0  CL 100  --  101 100  CO2 24  --  24 23  GLUCOSE 121*  --  150* 128*  BUN 11  --  12 17  CREATININE 0.96  --  1.00 0.87  CALCIUM 8.8*  --  8.9 8.6*  MG  --  1.9 1.9 2.0  PHOS  --  3.3  3.7 2.9   GFR: Estimated Creatinine Clearance: 47.2 mL/min (by C-G formula based on SCr of 0.87 mg/dL). Recent Labs  Lab 09/16/20 1216 09/16/20 1545 09/17/20 0310 09/18/20 0109  PROCALCITON  --   --  <0.10  --   WBC 8.1  --  7.2 8.2  LATICACIDVEN  --  1.6  --   --     Liver Function Tests: Recent Labs  Lab 09/16/20 1216 09/17/20 0310 09/18/20 0109  AST 38 39  --   ALT 34 39  --   ALKPHOS 59 62  --   BILITOT 1.1 1.2  --   PROT 6.2* 6.0*  --   ALBUMIN 3.3* 3.2* 2.8*   No results for input(s): LIPASE, AMYLASE in the last 168 hours. No results for input(s): AMMONIA in the last 168 hours.  ABG No results found for: PHART, PCO2ART, PO2ART, HCO3, TCO2, ACIDBASEDEF, O2SAT   Coagulation Profile: No results for input(s): INR, PROTIME in the last 168 hours.  Cardiac Enzymes: Recent Labs  Lab 09/17/20 0310  CKTOTAL 58    HbA1C: Hgb A1c MFr Bld  Date/Time Value Ref Range Status  09/19/2011 11:40 AM 4.9 4.6 - 6.5 % Final  Comment:    Glycemic Control Guidelines for People with Diabetes:Non Diabetic:  <6%Goal of Therapy: <7%Additional Action Suggested:  >8%     CBG: No results for input(s): GLUCAP in the last 168 hours.  Review of Systems:   ***  Past Medical History  He,  has a past medical history of AAA (abdominal aortic aneurysm) ([place]), Age-related macular degeneration, wet, left eye ([place]), Allergic rhinitis, Arthritis, At risk for pulmonary fibrosis (06/25/2018), CAD (coronary artery disease), CHF (congestive heart failure) ([place]), Chronic systolic CHF (congestive heart failure) ([place]) (12/17/2013), Coronary atherosclerosis (06/09/2007), DEGENERATIVE JOINT DISEASE (03/17/2010), Dyslipidemia (05/06/2007), Eczema (07/09/2013), ED (erectile dysfunction), Epidermoid cyst of skin of chest (09/29/2018), ERECTILE DYSFUNCTION (12/09/2007), Essential hypertension (04/30/2007), GERD (07/21/2008), GERD (gastroesophageal reflux disease), Glaucoma and macular degeneration (12/31/2011),  Hyperlipidemia, Hypertension, Inguinal hernia (12/31/2011), Insomnia (02/22/2018), Ischemic cardiomyopathy (06/23/2018), Left inguinal hernia (06/30/2018), Myocardial infarction ([place]) (1989), PCP notes >>>>>>>>>>>>>>>>>>>> (07/17/2016), Pneumonia (2019), Pre-operative cardiovascular examination (12/31/2011), Rhinitis (04/20/2008), and Vertigo (06/20/2015).   Surgical History    Past Surgical History:  Procedure Laterality Date  . AAA repair  2003  . CARDIAC CATHETERIZATION    . CHOLECYSTECTOMY    . INGUINAL HERNIA REPAIR Right 1985  . INGUINAL HERNIA REPAIR Left 06/30/2018   open; w/mesh  . INGUINAL HERNIA REPAIR Left 06/30/2018   Procedure: OPEN LEFT INGUINAL HERNIA REPAIR;  Surgeon: Fanny Skates, MD;  Location: [facility];  Service: General;  Laterality: Left;  . INSERTION OF MESH Left 06/30/2018   Procedure: INSERTION OF MESH;  Surgeon: Fanny Skates, MD;  Location: [facility];  Service: General;  Laterality: Left;  Marland Kitchen MASS EXCISION N/A 09/29/2018   Procedure: EXCISION OF 3 CM MASS ANTERIOR CHEST WALL;  Surgeon: Fanny Skates, MD;  Location: [facility];  Service: General;  Laterality: N/A;  . [place] History   reports that he has quit smoking. He has never used smokeless tobacco. He reports that he does not drink alcohol and does not use drugs.   Family History   His family history includes CAD in an other family member; Kidney Stones in his son; Lung cancer in his father. There is no history of Prostate cancer, Diabetes, or Colon cancer.   Allergies Allergies  Allergen Reactions  . Ace Inhibitors Cough  . Aspirin Nausea Only and Other (See Comments)    Full strength only  . Spironolactone Rash     Home Medications  Prior to Admission medications   Medication Sig Start Date End Date Taking? Authorizing Provider  acetaminophen (TYLENOL) 500 MG tablet Take 1,000 mg by mouth every 6 (six) hours as needed for mild pain.   Yes [provider]  albuterol  (VENTOLIN HFA) 108 (90 Base) MCG/ACT inhaler Inhale 2 puffs into the lungs every 6 (six) hours as needed for wheezing or shortness of breath. 06/13/20  Yes Paz, Alda Berthold, MD  amLODipine (NORVASC) 2.5 MG tablet Take 1 tablet (2.5 mg total) by mouth daily. 10/29/16  Yes Dorothy Spark, MD  aspirin 81 MG chewable tablet Chew 81 mg by mouth daily.    Yes [provider]  atenolol (TENORMIN) 50 MG tablet Take 1 tablet (50 mg total) by mouth daily. 07/01/18  Yes Fanny Skates, MD  azelastine (ASTELIN) 0.1 % nasal spray Place 2 sprays into both nostrils 2 (two) times daily. Use in each nostril as directed 06/28/17  Yes Paz, Jacqulyn Bath E, MD  betamethasone valerate (VALISONE) 0.1 % cream Apply 1 application topically  daily.   Yes [provider]  Dorzolamide HCl-Timolol Mal PF 22.3-6.8 MG/ML SOLN Place 1 drop into both eyes 2 (two) times daily.    Yes [provider]  fenofibrate (TRICOR) 48 MG tablet Take 48 mg by mouth daily.   Yes [provider]  HYDROcodone-acetaminophen (NORCO) 5-325 MG tablet 0.5-1 tablet po bid prn pain Patient taking differently: Take 0.5-1 tablets by mouth every 12 (twelve) hours as needed for moderate pain. 0.5-1 tablet po bid prn pain 09/07/20  Yes Paz, Jacqulyn Bath E, MD  ipratropium (ATROVENT) 0.06 % nasal spray Place 2 sprays into both nostrils 4 (four) times daily. 07/29/19  Yes Paz, Alda Berthold, MD  isosorbide mononitrate (IMDUR) 30 MG 24 hr tablet Take 0.5 tablets (15 mg total) by mouth daily. 09/14/20  Yes Weaver, Scott T, PA-C  latanoprost (XALATAN) 0.005 % ophthalmic solution Place 1 drop into the right eye at bedtime.   Yes [provider]  MELATONIN PO Take 3 mg by mouth at bedtime as needed (sleep).    Yes [provider]  Multiple Vitamin (MULTIVITAMIN) capsule Take 1 capsule by mouth at bedtime.    Yes [provider]  Multiple Vitamins-Minerals (ICAPS AREDS FORMULA PO) Take 1 capsule by mouth in the morning and at bedtime.     Yes [provider]  nitroGLYCERIN (NITROSTAT) 0.4 MG SL tablet Place 1 tablet (0.4 mg total) under the tongue every 5 (five) minutes as needed for chest pain. 09/14/20  Yes Weaver, Scott T, PA-C  pantoprazole (PROTONIX) 40 MG tablet Take 1 tablet (40 mg total) by mouth daily. 08/31/20  Yes Paz, Alda Berthold, MD  simvastatin (ZOCOR) 10 MG tablet Take 1 tablet (10 mg total) by mouth at bedtime. 05/14/12  Yes Paz, Alda Berthold, MD  tamsulosin (FLOMAX) 0.4 MG CAPS capsule Take 1 capsule (0.4 mg total) by mouth daily after supper. 08/31/20  Yes Paz, Jacqulyn Bath E, MD  lidocaine (LIDODERM) 5 % Place 1 patch onto the skin daily. Remove & Discard patch within 12 hours or as directed by MD Patient not taking: Reported on 09/17/2020    [provider]  meclizine (ANTIVERT) 25 MG tablet Take 1 tablet (25 mg total) by mouth 2 (two) times daily as needed for dizziness. Patient not taking: Reported on 09/17/2020 06/20/15   Colon Branch, MD  omeprazole (PRILOSEC) 40 MG capsule Take 40 mg by mouth daily. Patient not taking: Reported on 09/17/2020    [provider]     Critical care time: ***

## 2020-09-18 NOTE — Progress Notes (Signed)
PROGRESS NOTE  Christopher Hogan ZOX:[id number] DOB: 1925/01/13   PCP: Colon Branch, MD  Patient is from: Home  DOA: 09/16/2020 LOS: 2  Chief complaints: Shortness of breath  Brief Narrative / Interim history: 86 year old male with PMH of CAD, pulmonary fibrosis?,  AAA, systolic CHF, HTN and postnasal drainage presenting with progressive dyspnea, fatigue and runny nose.    In ED, vital signs within normal except for elevated respiratory rate to mid and upper 20s.  94% on RA.  WBC 8.1.  Hgb 12.7. Na 134.  BNP 389.  Troponin negative x2.  D-dimer 3.46.  CTA chest negative for PE but multifocal pneumonia concerning for viral etiology.  Mildly elevated inflammatory markers including CRP and LDH as well.  COVID-19, influenza A and B and RSV negative x2.  Empirically started on azithromycin and Zosyn for community-acquired pneumonia and admitted for "hypoxemic respiratory failure".   Procalcitonin negative.  Discontinued IV Zosyn.  Desaturated to 86% at rest and 82% with ambulation requiring 4 L to recover to 88%. TTE with EF of 35 to 40% and RWMA with hypokinesis of inferolateral wall and basal mid lateral wall.  There is no prior echo in his chart of care everywhere to compare to but history of systolic CHF with EF of 81-19% per last cardiology note in 11/2019. Siscussed TTE with on-call cardiologist, Dr. Radford Pax who recommended outpatient follow-up with his cardiologist, Dr. Meda Coffee.  He already has an upcoming appointment next week.  PCCM consulted about respiratory failure.    Subjective: Seen and examined earlier this morning.  No major events overnight of this morning.  He says he had a brief lightheadedness when he got up yesterday.  Breathing improved.  Still have some cough mostly dry.  He denies chest pain, orthopnea or PND.  No leg swelling.  Denies GI or UTI symptoms.  Objective: Vitals:   09/18/20 0100 09/18/20 0200 09/18/20 0322 09/18/20 0712  BP: 120/62 (!) 108/51 127/64 127/67   Pulse: 61 (!) 59 (!) 51 65  Resp: 20 (!) 22 (!) 22 20  Temp:   98.3 F (36.8 C) 97.7 F (36.5 C)  TempSrc:   Oral Oral  SpO2: 98% 99% 98% 95%  Weight:      Height:        Intake/Output Summary (Last 24 hours) at 09/18/2020 1124 Last data filed at 09/18/2020 0130 Gross per 24 hour  Intake 489.7 ml  Output 800 ml  Net -310.3 ml   Filed Weights   09/16/20 1208 09/17/20 0005  Weight: 68.5 kg 61.6 kg    Examination:  GENERAL: No apparent distress.  Nontoxic. HEENT: MMM.  Vision and hearing grossly intact.  NECK: Supple.  No apparent JVD.  RESP: 92% on RA at rest.  No IWOB.  Coarse breath sounds bilaterally CVS:  RRR. Heart sounds normal.  ABD/GI/GU: BS+. Abd soft, NTND.  MSK/EXT:  Moves extremities. No apparent deformity. No edema.  SKIN: no apparent skin lesion or wound NEURO: Awake, alert and oriented appropriately.  No apparent focal neuro deficit. PSYCH: Calm. Normal affect.  Procedures:  None  Microbiology summarized: COVID-19 PCR negative x2. Influenza A and B, and RSV negative x2. RVP negative.  Assessment & Plan: Acute hypoxemic respiratory failure/dyspnea/cough: suspect viral pneumonia. CTA chest negative for PE but multifocal pneumonia consistent with viral etiology.   Has no leukocytosis, fever or significant procalcitonin to suggest bacterial etiology.  COVID-19 PCR, influenza and RSV negative x2.  Broad RVP panel negative.  He has elevated  BNP to 380s but no prior to compare to.  He does not appear fluid overloaded.  Some mention about risk for pulmonary fibrosis but not sure the basis of this.  He is slightly tachypneic.  He has coarse breath sounds bilaterally. -Discontinued IV Zosyn on 10/30 -Continue azithromycin and prednisone -Recheck procalcitonin -Wean oxygen as able, incentive spirometry, OOB, PT/OT -PCCM consult -Ambulate for oxygen saturation assessment  Chronic systolic CHF: TTE with EF of 35 to 40% and RWMA with hypokinesis of inferolateral  wall and basal mid lateral wall.  There is no prior echo in his chart of care everywhere to compare to but history of systolic CHF with EF of 96-78% per last cardiology note in 11/2019.  He sees Dr. Meda Coffee outpatient.  He has upcoming appointment next week.  Discussed CT finding with on-call cardiologist, Dr. Radford Pax who recommended outpatient evaluation.  He has slightly elevated BNP to 380s but appears euvolemic clinically.  Not on diuretics at home.  He has some orthostasis but orthostatic vitals negative.  -Monitor fluid status -May consider low-dose Lasix challenge -Changed atenolol to metoprolol XL. -Outpatient follow-up with cardiology next week  Essential hypertension: Normotensive.  -Continue home medications  Orthostasis-reports postural orthostasis.  Ongoing issue for about a month. He is on multiple antihypertensive meds.  Some drop in SBP with standing at 0-minute but recovers at 3 minutes. -Apply TED hose -Check TSH  Elevated D-dimer: CTA chest negative for PE.  LE [place] negative for DVT.  CAD/history of inferior wall MI: No ischemia on his stress test in 2017.  No chest pain. -Continue home medications  Normocytic anemia: H&H relatively stable. -Check anemia panel  Mild hyponatremia: Na 132.  No specific medicine to contribute. -Check urine chemistry -Continue monitoring  Postnasal drainage -Loratadine and Flonase spray  Constipation -As needed MiraLAX and Senokot  GERD -PPI  BPH without LUTS -Continue home Flomax  Body mass index is 21.27 kg/m.         DVT prophylaxis:  Place TED hose Start: 09/18/20 1042 heparin injection 5,000 Units Start: 09/16/20 1730 SCDs Start: 09/16/20 1713  Code Status: Full code Family Communication: Updated patient's wife over the phone. Status is: Inpatient   Remains inpatient appropriate because:Unsafe d/c plan, Inpatient level of care appropriate due to severity of illness and Ongoing hypoxemic respiratory  failure   Dispo: The patient is from: Home              Anticipated d/c is to: Home              Anticipated d/c date is: 1 day              Patient currently is not medically stable to d/c.       Consultants:  Cardiology over the phone PCCM   Sch Meds:  Scheduled Meds: . amLODipine  2.5 mg Oral Daily  . vitamin C  500 mg Oral Daily  . aspirin  81 mg Oral Daily  . azelastine  2 spray Each Nare BID  . dorzolamide-timolol  1 drop Both Eyes BID  . fenofibrate  54 mg Oral Daily  . fluticasone  1 spray Each Nare Daily  . heparin  5,000 Units Subcutaneous Q8H  . isosorbide mononitrate  15 mg Oral Daily  . latanoprost  1 drop Right Eye QHS  . loratadine  10 mg Oral Daily  . methylPREDNISolone (SOLU-MEDROL) injection  1 mg/kg Intravenous Q12H   Followed by  . [START ON 09/19/2020] predniSONE  50  mg Oral Daily  . metoprolol succinate  50 mg Oral Daily  . pantoprazole  40 mg Oral Daily  . simvastatin  10 mg Oral QHS  . tamsulosin  0.4 mg Oral QPC supper  . zinc sulfate  220 mg Oral Daily   Continuous Infusions: . azithromycin 500 mg (09/17/20 2216)   PRN Meds:.albuterol, chlorpheniramine-HYDROcodone, guaiFENesin-dextromethorphan, HYDROcodone-acetaminophen, Ipratropium-Albuterol, polyethylene glycol, senna-docusate  Antimicrobials: Anti-infectives (From admission, onward)   Start     Dose/Rate Route Frequency Ordered Stop   09/16/20 2200  piperacillin-tazobactam (ZOSYN) IVPB 3.375 g  Status:  Discontinued        3.375 g 12.5 mL/hr over 240 Minutes Intravenous Every 8 hours 09/16/20 2007 09/17/20 1100   09/16/20 2130  azithromycin (ZITHROMAX) 500 mg in sodium chloride 0.9 % 250 mL IVPB        500 mg 250 mL/hr over 60 Minutes Intravenous Every 24 hours 09/16/20 1933 09/21/20 2129       I have personally reviewed the following labs and images: CBC: Recent Labs  Lab 09/16/20 1216 09/17/20 0310 09/18/20 0109  WBC 8.1 7.2 8.2  NEUTROABS 6.2 6.5  --   HGB 12.7* 12.5*  10.7*  HCT 39.7 37.6* 32.7*  MCV 97.5 95.7 94.2  PLT 264 228 249   BMP &GFR Recent Labs  Lab 09/16/20 1216 09/16/20 1545 09/17/20 0310 09/18/20 0109  NA 134*  --  135 132*  K 4.1  --  4.9 4.0  CL 100  --  101 100  CO2 24  --  24 23  GLUCOSE 121*  --  150* 128*  BUN 11  --  12 17  CREATININE 0.96  --  1.00 0.87  CALCIUM 8.8*  --  8.9 8.6*  MG  --  1.9 1.9 2.0  PHOS  --  3.3 3.7 2.9   Estimated Creatinine Clearance: 47.2 mL/min (by C-G formula based on SCr of 0.87 mg/dL). Liver & Pancreas: Recent Labs  Lab 09/16/20 1216 09/17/20 0310 09/18/20 0109  AST 38 39  --   ALT 34 39  --   ALKPHOS 59 62  --   BILITOT 1.1 1.2  --   PROT 6.2* 6.0*  --   ALBUMIN 3.3* 3.2* 2.8*   No results for input(s): LIPASE, AMYLASE in the last 168 hours. No results for input(s): AMMONIA in the last 168 hours. Diabetic: No results for input(s): HGBA1C in the last 72 hours. No results for input(s): GLUCAP in the last 168 hours. Cardiac Enzymes: Recent Labs  Lab 09/17/20 0310  CKTOTAL 58   No results for input(s): PROBNP in the last 8760 hours. Coagulation Profile: No results for input(s): INR, PROTIME in the last 168 hours. Thyroid Function Tests: Recent Labs    09/16/20 1545  TSH 2.903  FREET4 0.90   Lipid Profile: No results for input(s): CHOL, HDL, LDLCALC, TRIG, CHOLHDL, LDLDIRECT in the last 72 hours. Anemia Panel: Recent Labs    09/16/20 1545 09/17/20 0310  FERRITIN 182 224   Urine analysis:    Component Value Date/Time   COLORURINE YELLOW 09/16/2020 1207   APPEARANCEUR CLOUDY (A) 09/16/2020 1207   LABSPEC 1.016 09/16/2020 1207   PHURINE 8.0 09/16/2020 [place] 09/16/2020 1207   [place] 09/16/2020 1207   [place] 09/16/2020 [place] 09/16/2020 [place] 09/16/2020 1207   NITRITE NEGATIVE 09/16/2020 1207   LEUKOCYTESUR NEGATIVE 09/16/2020 1207   Sepsis Labs: Invalid input(s): PROCALCITONIN,  LACTICIDVEN  Microbiology: Recent Results (from the past 240 hour(s))  Respiratory Panel by RT PCR (Flu A&B, Covid) - Nasopharyngeal Swab     Status: None   Collection Time: 09/16/20 12:16 PM   Specimen: Nasopharyngeal Swab  Result Value Ref Range Status   SARS Coronavirus 2 by RT PCR NEGATIVE NEGATIVE Final    Comment: (NOTE) SARS-CoV-2 target nucleic acids are NOT DETECTED.  The SARS-CoV-2 RNA is generally detectable in upper respiratoy specimens during the acute phase of infection. The lowest concentration of SARS-CoV-2 viral copies this assay can detect is 131 copies/mL. A negative result does not preclude SARS-Cov-2 infection and should not be used as the sole basis for treatment or other patient management decisions. A negative result may occur with  improper specimen collection/handling, submission of specimen other than nasopharyngeal swab, presence of viral mutation(s) within the areas targeted by this assay, and inadequate number of viral copies (<131 copies/mL). A negative result must be combined with clinical observations, patient history, and epidemiological information. The expected result is Negative.  Fact Sheet for Patients:  PinkCheek.be  Fact Sheet for Healthcare Providers:  GravelBags.it  This test is no t yet approved or cleared by the [place] FDA and  has been authorized for detection and/or diagnosis of SARS-CoV-2 by FDA under an Emergency Use Authorization (EUA). This EUA will remain  in effect (meaning this test can be used) for the duration of the COVID-19 declaration under Section 564(b)(1) of the Act, 21 U.S.C. section 360bbb-3(b)(1), unless the authorization is terminated or revoked sooner.     Influenza A by PCR NEGATIVE NEGATIVE Final   Influenza B by PCR NEGATIVE NEGATIVE Final    Comment: (NOTE) The Xpert Xpress SARS-CoV-2/FLU/RSV assay is intended as an aid in  the diagnosis of  influenza from Nasopharyngeal swab specimens and  should not be used as a sole basis for treatment. Nasal washings and  aspirates are unacceptable for Xpert Xpress SARS-CoV-2/FLU/RSV  testing.  Fact Sheet for Patients: PinkCheek.be  Fact Sheet for Healthcare Providers: GravelBags.it  This test is not yet approved or cleared by the [place] FDA and  has been authorized for detection and/or diagnosis of SARS-CoV-2 by  FDA under an Emergency Use Authorization (EUA). This EUA will remain  in effect (meaning this test can be used) for the duration of the  Covid-19 declaration under Section 564(b)(1) of the Act, 21  U.S.C. section 360bbb-3(b)(1), unless the authorization is  terminated or revoked. Performed at [facility], [place] [address], [place], [place] [address]   Respiratory Panel by RT PCR (Flu A&B, Covid) - Nasopharyngeal Swab     Status: None   Collection Time: 09/16/20  7:44 PM   Specimen: Nasopharyngeal Swab  Result Value Ref Range Status   SARS Coronavirus 2 by RT PCR NEGATIVE NEGATIVE Final    Comment: (NOTE) SARS-CoV-2 target nucleic acids are NOT DETECTED.  The SARS-CoV-2 RNA is generally detectable in upper respiratoy specimens during the acute phase of infection. The lowest concentration of SARS-CoV-2 viral copies this assay can detect is 131 copies/mL. A negative result does not preclude SARS-Cov-2 infection and should not be used as the sole basis for treatment or other patient management decisions. A negative result may occur with  improper specimen collection/handling, submission of specimen other than nasopharyngeal swab, presence of viral mutation(s) within the areas targeted by this assay, and inadequate number of viral copies (<131 copies/mL). A negative result must be combined with clinical observations, patient history, and epidemiological  information. The expected result is  Negative.  Fact Sheet for Patients:  PinkCheek.be  Fact Sheet for Healthcare Providers:  GravelBags.it  This test is no t yet approved or cleared by the [place] FDA and  has been authorized for detection and/or diagnosis of SARS-CoV-2 by FDA under an Emergency Use Authorization (EUA). This EUA will remain  in effect (meaning this test can be used) for the duration of the COVID-19 declaration under Section 564(b)(1) of the Act, 21 U.S.C. section 360bbb-3(b)(1), unless the authorization is terminated or revoked sooner.     Influenza A by PCR NEGATIVE NEGATIVE Final   Influenza B by PCR NEGATIVE NEGATIVE Final    Comment: (NOTE) The Xpert Xpress SARS-CoV-2/FLU/RSV assay is intended as an aid in  the diagnosis of influenza from Nasopharyngeal swab specimens and  should not be used as a sole basis for treatment. Nasal washings and  aspirates are unacceptable for Xpert Xpress SARS-CoV-2/FLU/RSV  testing.  Fact Sheet for Patients: PinkCheek.be  Fact Sheet for Healthcare Providers: GravelBags.it  This test is not yet approved or cleared by the [place] FDA and  has been authorized for detection and/or diagnosis of SARS-CoV-2 by  FDA under an Emergency Use Authorization (EUA). This EUA will remain  in effect (meaning this test can be used) for the duration of the  Covid-19 declaration under Section 564(b)(1) of the Act, 21  U.S.C. section 360bbb-3(b)(1), unless the authorization is  terminated or revoked. Performed at [facility], [place] [address] [place], [place] [address]   Respiratory Panel by PCR     Status: None   Collection Time: 09/17/20 12:45 PM   Specimen: Nasopharyngeal Swab; Respiratory  Result Value Ref Range Status   Adenovirus NOT DETECTED NOT DETECTED Final   Coronavirus 229E NOT DETECTED NOT DETECTED Final    Comment: (NOTE) The  Coronavirus on the Respiratory Panel, DOES NOT test for the novel  Coronavirus (2019 nCoV)    Coronavirus HKU1 NOT DETECTED NOT DETECTED Final   Coronavirus NL63 NOT DETECTED NOT DETECTED Final   Coronavirus OC43 NOT DETECTED NOT DETECTED Final   Metapneumovirus NOT DETECTED NOT DETECTED Final   Rhinovirus / Enterovirus NOT DETECTED NOT DETECTED Final   Influenza A NOT DETECTED NOT DETECTED Final   Influenza B NOT DETECTED NOT DETECTED Final   Parainfluenza Virus 1 NOT DETECTED NOT DETECTED Final   Parainfluenza Virus 2 NOT DETECTED NOT DETECTED Final   Parainfluenza Virus 3 NOT DETECTED NOT DETECTED Final   Parainfluenza Virus 4 NOT DETECTED NOT DETECTED Final   Respiratory Syncytial Virus NOT DETECTED NOT DETECTED Final   Bordetella pertussis NOT DETECTED NOT DETECTED Final   Chlamydophila pneumoniae NOT DETECTED NOT DETECTED Final   Mycoplasma pneumoniae NOT DETECTED NOT DETECTED Final    Comment: Performed at [facility], [place]. [address] [place], [place] [address]    Radiology Studies: VAS [place] LOWER EXTREMITY VENOUS (DVT)  Result Date: 09/17/2020  Lower Venous DVT Study Indications: SOB, and elevated D-dimer.  Comparison Study: No prior study Performing Technologist: Sharion Dove RVS  Examination Guidelines: A complete evaluation includes B-mode imaging, spectral Doppler, color Doppler, and power Doppler as needed of all accessible portions of each vessel. Bilateral testing is considered an integral part of a complete examination. Limited examinations for reoccurring indications may be performed as noted. The reflux portion of the exam is performed with the patient in reverse Trendelenburg.  +---------+---------------+---------+-----------+----------+--------------+ RIGHT    CompressibilityPhasicitySpontaneityPropertiesThrombus Aging +---------+---------------+---------+-----------+----------+--------------+ CFV  Full           Yes      Yes                                  +---------+---------------+---------+-----------+----------+--------------+ SFJ      Full                                                        +---------+---------------+---------+-----------+----------+--------------+ FV Prox  Full                                                        +---------+---------------+---------+-----------+----------+--------------+ FV Mid   Full                                                        +---------+---------------+---------+-----------+----------+--------------+ FV DistalFull                                                        +---------+---------------+---------+-----------+----------+--------------+ PFV      Full                                                        +---------+---------------+---------+-----------+----------+--------------+ POP      Full           Yes      Yes                                 +---------+---------------+---------+-----------+----------+--------------+ PTV      Full                                                        +---------+---------------+---------+-----------+----------+--------------+ PERO     Full                                                        +---------+---------------+---------+-----------+----------+--------------+   +---------+---------------+---------+-----------+----------+--------------+ LEFT     CompressibilityPhasicitySpontaneityPropertiesThrombus Aging +---------+---------------+---------+-----------+----------+--------------+ CFV      Full           Yes      Yes                                 +---------+---------------+---------+-----------+----------+--------------+  SFJ      Full                                                        +---------+---------------+---------+-----------+----------+--------------+ FV Prox  Full                                                         +---------+---------------+---------+-----------+----------+--------------+ FV Mid   Full                                                        +---------+---------------+---------+-----------+----------+--------------+ FV DistalFull                                                        +---------+---------------+---------+-----------+----------+--------------+ PFV      Full                                                        +---------+---------------+---------+-----------+----------+--------------+ POP      Full           Yes      Yes                                 +---------+---------------+---------+-----------+----------+--------------+ PTV      Full                                                        +---------+---------------+---------+-----------+----------+--------------+ PERO     Full                                                        +---------+---------------+---------+-----------+----------+--------------+     Summary: BILATERAL: - No evidence of deep vein thrombosis seen in the lower extremities, bilaterally. -   *See table(s) above for measurements and observations. Electronically signed by Deitra Mayo MD on 09/17/2020 at 1:50:27 PM.    Final       Caitlen Worth T. [place]  If 7PM-7AM, please contact night-coverage [email or web address] 09/18/2020, 11:24 AM

## 2020-09-18 NOTE — Evaluation (Signed)
Physical Therapy Evaluation Patient Details Name: Christopher Hogan MRN: [id number] DOB: 12/11/1924 Today's Date: 09/18/2020   History of Present Illness  86 y/o M presenting to ED with c/o SOB, runny nose, and worsening fatigue. PMH includes AAA, CAD, and pulmonary fibrosis, CHF, HTN  Clinical Impression  Pt presents to PT with decreased endurance and safety awareness during gait. Pt was unable to keep spO2 above 90% on 2L of oxygen during gait, despite cues for deep breathing. Pt would benefit from skilled therapy in order to increase endurance while decreasing supplemental oxygen and educate on safe functional mobility and gait. Pre-gait spO2: 92% on no supplemental O2 During gait spO2: 90-95% on 3L    Follow Up Recommendations Home health PT;Supervision for mobility/OOB    Equipment Recommendations  Rolling walker with 5" wheels (wife reports she can get RW)    Recommendations for Other Services       Precautions / Restrictions Precautions Precautions: Fall Restrictions Weight Bearing Restrictions: No      Mobility  Bed Mobility   Transfers Overall transfer level: Needs assistance Equipment used: Rolling walker (2 wheeled) Transfers: Sit to/from Stand Sit to Stand: Min assist         General transfer comment: cues for hand placement as pt pulling up on RW despite cues    Ambulation/Gait Ambulation/Gait assistance: Min guard Gait Distance (Feet): 400 Feet Assistive device: Rolling walker (2 wheeled) Gait Pattern/deviations: Trunk flexed Gait velocity: pt was unable to maintain a safe gait speed despite several cues to slow down Gait velocity interpretation: >4.37 ft/sec, indicative of normal walking speed General Gait Details: cues for breathing technique, position in RW  Stairs            Wheelchair Mobility    Modified Rankin (Stroke Patients Only)       Balance Overall balance assessment: needs assistance                              Pertinent Vitals/Pain Pain Assessment: No/denies pain    Home Living Family/patient expects to be discharged to:: Private residence Living Arrangements: Spouse/significant other Available Help at Discharge: Family Type of Home: House Home Access: Level entry     Home Layout: One level Home Equipment: Environmental consultant - 2 wheels;Bedside commode;Shower seat Additional Comments: wife uses rollator    Prior Function Level of Independence: Independent         Comments: was independent without AD; recently using RW due to weakness. Daughter gets groceries, cooks, Microbiologist, and does IT consultant        Extremity/Trunk Assessment   Upper Extremity Assessment: Generalized weakness Lower Extremity Assessment: Generalized weakness       Communication   Communication: HOH  Cognition Arousal/Alertness: Awake/alert Behavior during Therapy: Impulsive Overall Cognitive Status: Impaired/Different from baseline General comments: pt had decreased safety awareness during gait and transfers                       General Comments      Exercises     Assessment/Plan    PT Assessment Patient needs continued PT services  PT Problem List Decreased strength;Decreased mobility;Decreased safety awareness;Decreased activity tolerance;Decreased knowledge of use of DME;Decreased balance;Cardiopulmonary status limiting activity;Decreased cognition       PT Treatment Interventions DME instruction;Therapeutic exercise;Gait training;Balance training;Functional mobility training;Therapeutic activities;Patient/family education;Cognitive remediation    PT Goals (Current goals can be found in  the Care Plan section)  Acute Rehab PT Goals Patient Stated Goal: return home PT Goal Formulation: With patient/family Time For Goal Achievement: 10/02/20 Potential to Achieve Goals: Good    Frequency Min 3X/week   Barriers to discharge Decreased caregiver support       Co-evaluation               AM-PAC PT "6 Clicks" Mobility  Outcome Measure Help needed turning from your back to your side while in a flat bed without using bedrails?: A Little Help needed moving from lying on your back to sitting on the side of a flat bed without using bedrails?: A Little Help needed moving to and from a bed to a chair (including a wheelchair)?: A Little Help needed standing up from a chair using your arms (e.g., wheelchair or bedside chair)?: A Little Help needed to walk in hospital room?: A Little Help needed climbing 3-5 steps with a railing? : A Lot 6 Click Score: 17    End of Session Equipment Utilized During Treatment: Gait belt;Oxygen Activity Tolerance: Patient tolerated treatment well Patient left: in chair;with call bell/phone within reach;with family/visitor present Nurse Communication: Mobility status PT Visit Diagnosis: Other abnormalities of gait and mobility (R26.89);Muscle weakness (generalized) (M62.81);Difficulty in walking, not elsewhere classified (R26.2)    Time: 7035-0093 PT Time Calculation (min) (ACUTE ONLY): 30 min   Charges:   PT Evaluation $PT Eval Moderate Complexity: 1 Mod PT Treatments $Gait Training: 8-22 mins        Caleb Popp, SPT [id number]  Serinity Ware 09/18/2020, 2:19 PM

## 2020-09-18 NOTE — Consult Note (Signed)
NAME:  Christopher Hogan, MRN:  [id number], DOB:  11-18-1925, LOS: 2 ADMISSION DATE:  09/16/2020, CONSULTATION DATE:  10/31 REFERRING MD:  Cyndia Skeeters, CHIEF COMPLAINT:  Abnormal ct chest and exertional hypoxia    Brief History   86 year old with reduced EF who presents with 2 weeks of worsening cough and mild DOE whom we are seeing in consultation for evaluation of exertional hypoxemia and abnormal CT scan. PCP note 05/2020 reviewed as well as notes this hospitalization.   History of present illness   Patient noted he had bronchitis 3-4 months ago. Started with a cough that naggingly persisted. Got a week of an antibiotic (not sure of name) and cough improved. However, over the last several weeks, his cough has returned. It is worse the last 2 weeks. Always worse when eating. Recent speech eval with silent aspiration. Recent symptoms associated with poor appetite, not eating much. Gradually the day or two prior developed DOE. No SOB at rest. No orthopnea or PND. He walked in his house and felt like he was going to pass out. He went to bed room and rested and this sensation resolved. That prompted presentation to ED.   In ED, no leukocytosis or fever. No eosinophilia (abs Eos 0). BNP elevated to ~390. CXR with what appears to be L lower infiltrate, low lung volumes on my interpretation. There was concern for PE which prompted CTA chest which revealed on my interpretation scattered central GGOs most prominent in bilateral upper lobes, motion artifact limits evaluation of lower lobes but look relatively clear. He was started on abx for CAP. His O2 sat on room air at rest in low 90s during our conversation. Noted to desaturate with exertion requiring O2 which is new. TTE this admission with reduced EF, no LA dilation, mild MR.  Past Medical History  CAD, AAA, CHF, HTN,   Significant Hospital Events   Admitted 10/30  Consults:  Pulmonary   Procedures:  n/a  Significant Diagnostic Tests:  As  above  Micro Data:  COVID negative, RVP negative  Antimicrobials:  Azithromycin 10/30 >>  Interim history/subjective:  As above  Objective   Blood pressure 122/69, pulse 68, temperature 97.7 F (36.5 C), temperature source Oral, resp. rate (!) 23, height 5\' 7"  (1.702 m), weight 61.6 kg, SpO2 91 %.        Intake/Output Summary (Last 24 hours) at 09/18/2020 1312 Last data filed at 09/18/2020 0815 Gross per 24 hour  Intake 726.7 ml  Output 800 ml  Net -73.3 ml   Filed Weights   09/16/20 1208 09/17/20 0005  Weight: 68.5 kg 61.6 kg    Examination: General: Frail, in NAD Eyes: EOMI, no icterus Neck: supple, no elevation in JVP sitting upright Lungs: CTAB, no wheeze or crackle Cardiovascular: RRR, no murmur Abdomen: soft, BS present Extremities: warm, no edema MSK: No joint effusion, no synovitis Neuro: no weakness, CN intact Psych: tearful, tangential at times, alert   Resolved Hospital Problem list   n/a  Assessment & Plan:  Exertional hypoxemia with abnormal CT scan: Predominantly central bilateral upper lobe GGOs. Differential is wide and include viral or atypical pneumonia, pulmonary edema, or other non-infectious pna (eos pna, HSP, organizing pna). His presentation is subacute which could explain the negative RVP and COVID tests. Most likely this is sequelae of viral illness that is too low to detect now on PCR. Atypical bacterial infection is also possible. Finally, his cough is worse with eating and has been observed to aspirate  on formal speech evaluation. Infiltrates could represent aspiration pneumonitis, upper lobes if occurred supine.  --Continue azithromycin --Recommend trial of gentle diuresis given reduced EF and pattern on imaging, no sign of RV overload on exam --Recommend repeat CT scan in 6-8 weeks to evaluate for resolution of infiltrates, will arrange pulm f/u  Best practice:  Per primary  Labs   CBC: Recent Labs  Lab 09/16/20 1216  09/17/20 0310 09/18/20 0109  WBC 8.1 7.2 8.2  NEUTROABS 6.2 6.5  --   HGB 12.7* 12.5* 10.7*  HCT 39.7 37.6* 32.7*  MCV 97.5 95.7 94.2  PLT 264 228 884    Basic Metabolic Panel: Recent Labs  Lab 09/16/20 1216 09/16/20 1545 09/17/20 0310 09/18/20 0109  NA 134*  --  135 132*  K 4.1  --  4.9 4.0  CL 100  --  101 100  CO2 24  --  24 23  GLUCOSE 121*  --  150* 128*  BUN 11  --  12 17  CREATININE 0.96  --  1.00 0.87  CALCIUM 8.8*  --  8.9 8.6*  MG  --  1.9 1.9 2.0  PHOS  --  3.3 3.7 2.9   GFR: Estimated Creatinine Clearance: 47.2 mL/min (by C-G formula based on SCr of 0.87 mg/dL). Recent Labs  Lab 09/16/20 1216 09/16/20 1545 09/17/20 0310 09/18/20 0109 09/18/20 1111  PROCALCITON  --   --  <0.10  --  <0.10  WBC 8.1  --  7.2 8.2  --   LATICACIDVEN  --  1.6  --   --   --     Liver Function Tests: Recent Labs  Lab 09/16/20 1216 09/17/20 0310 09/18/20 0109  AST 38 39  --   ALT 34 39  --   ALKPHOS 59 62  --   BILITOT 1.1 1.2  --   PROT 6.2* 6.0*  --   ALBUMIN 3.3* 3.2* 2.8*   No results for input(s): LIPASE, AMYLASE in the last 168 hours. No results for input(s): AMMONIA in the last 168 hours.  ABG No results found for: PHART, PCO2ART, PO2ART, HCO3, TCO2, ACIDBASEDEF, O2SAT   Coagulation Profile: No results for input(s): INR, PROTIME in the last 168 hours.  Cardiac Enzymes: Recent Labs  Lab 09/17/20 0310  CKTOTAL 58    HbA1C: Hgb A1c MFr Bld  Date/Time Value Ref Range Status  09/19/2011 11:40 AM 4.9 4.6 - 6.5 % Final    Comment:    Glycemic Control Guidelines for People with Diabetes:Non Diabetic:  <6%Goal of Therapy: <7%Additional Action Suggested:  >8%     CBG: No results for input(s): GLUCAP in the last 168 hours.  Review of Systems:   No LE edema. No chest pain. Endorses runny nose.  Past Medical History  He,  has a past medical history of AAA (abdominal aortic aneurysm) ([place]), Age-related macular degeneration, wet, left eye ([place]),  Allergic rhinitis, Arthritis, At risk for pulmonary fibrosis (06/25/2018), CAD (coronary artery disease), CHF (congestive heart failure) ([place]), Chronic systolic CHF (congestive heart failure) ([place]) (12/17/2013), Coronary atherosclerosis (06/09/2007), DEGENERATIVE JOINT DISEASE (03/17/2010), Dyslipidemia (05/06/2007), Eczema (07/09/2013), ED (erectile dysfunction), Epidermoid cyst of skin of chest (09/29/2018), ERECTILE DYSFUNCTION (12/09/2007), Essential hypertension (04/30/2007), GERD (07/21/2008), GERD (gastroesophageal reflux disease), Glaucoma and macular degeneration (12/31/2011), Hyperlipidemia, Hypertension, Inguinal hernia (12/31/2011), Insomnia (02/22/2018), Ischemic cardiomyopathy (06/23/2018), Left inguinal hernia (06/30/2018), Myocardial infarction ([place]) (1989), PCP notes >>>>>>>>>>>>>>>>>>>> (07/17/2016), Pneumonia (2019), Pre-operative cardiovascular examination (12/31/2011), Rhinitis (04/20/2008), and Vertigo (06/20/2015).   Surgical History  Past Surgical History:  Procedure Laterality Date  . AAA repair  2003  . CARDIAC CATHETERIZATION    . CHOLECYSTECTOMY    . INGUINAL HERNIA REPAIR Right 1985  . INGUINAL HERNIA REPAIR Left 06/30/2018   open; w/mesh  . INGUINAL HERNIA REPAIR Left 06/30/2018   Procedure: OPEN LEFT INGUINAL HERNIA REPAIR;  Surgeon: Fanny Skates, MD;  Location: [facility];  Service: General;  Laterality: Left;  . INSERTION OF MESH Left 06/30/2018   Procedure: INSERTION OF MESH;  Surgeon: Fanny Skates, MD;  Location: [facility];  Service: General;  Laterality: Left;  Marland Kitchen MASS EXCISION N/A 09/29/2018   Procedure: EXCISION OF 3 CM MASS ANTERIOR CHEST WALL;  Surgeon: Fanny Skates, MD;  Location: [facility];  Service: General;  Laterality: N/A;  . [place] History   reports that he has quit smoking. He has never used smokeless tobacco. He reports that he does not drink alcohol and does not use drugs.   Family History   His family history includes CAD in an  other family member; Kidney Stones in his son; Lung cancer in his father. There is no history of Prostate cancer, Diabetes, or Colon cancer.   Allergies Allergies  Allergen Reactions  . Ace Inhibitors Cough  . Aspirin Nausea Only and Other (See Comments)    Full strength only  . Spironolactone Rash     Home Medications  Prior to Admission medications   Medication Sig Start Date End Date Taking? Authorizing Provider  acetaminophen (TYLENOL) 500 MG tablet Take 1,000 mg by mouth every 6 (six) hours as needed for mild pain.   Yes [provider]  albuterol (VENTOLIN HFA) 108 (90 Base) MCG/ACT inhaler Inhale 2 puffs into the lungs every 6 (six) hours as needed for wheezing or shortness of breath. 06/13/20  Yes Paz, Alda Berthold, MD  amLODipine (NORVASC) 2.5 MG tablet Take 1 tablet (2.5 mg total) by mouth daily. 10/29/16  Yes Dorothy Spark, MD  aspirin 81 MG chewable tablet Chew 81 mg by mouth daily.    Yes [provider]  atenolol (TENORMIN) 50 MG tablet Take 1 tablet (50 mg total) by mouth daily. 07/01/18  Yes Fanny Skates, MD  azelastine (ASTELIN) 0.1 % nasal spray Place 2 sprays into both nostrils 2 (two) times daily. Use in each nostril as directed 06/28/17  Yes Paz, Jacqulyn Bath E, MD  betamethasone valerate (VALISONE) 0.1 % cream Apply 1 application topically daily.   Yes [provider]  Dorzolamide HCl-Timolol Mal PF 22.3-6.8 MG/ML SOLN Place 1 drop into both eyes 2 (two) times daily.    Yes [provider]  fenofibrate (TRICOR) 48 MG tablet Take 48 mg by mouth daily.   Yes [provider]  HYDROcodone-acetaminophen (NORCO) 5-325 MG tablet 0.5-1 tablet po bid prn pain Patient taking differently: Take 0.5-1 tablets by mouth every 12 (twelve) hours as needed for moderate pain. 0.5-1 tablet po bid prn pain 09/07/20  Yes Paz, Jacqulyn Bath E, MD  ipratropium (ATROVENT) 0.06 % nasal spray Place 2 sprays into both nostrils 4 (four) times daily. 07/29/19  Yes Paz, Alda Berthold, MD  isosorbide mononitrate (IMDUR) 30 MG 24 hr tablet Take 0.5 tablets (15 mg total) by mouth daily. 09/14/20  Yes Weaver, Scott T, PA-C  latanoprost (XALATAN) 0.005 % ophthalmic solution Place 1 drop into the right eye at bedtime.   Yes [provider]  MELATONIN PO Take 3 mg by mouth at  bedtime as needed (sleep).    Yes [provider]  Multiple Vitamin (MULTIVITAMIN) capsule Take 1 capsule by mouth at bedtime.    Yes [provider]  Multiple Vitamins-Minerals (ICAPS AREDS FORMULA PO) Take 1 capsule by mouth in the morning and at bedtime.    Yes [provider]  nitroGLYCERIN (NITROSTAT) 0.4 MG SL tablet Place 1 tablet (0.4 mg total) under the tongue every 5 (five) minutes as needed for chest pain. 09/14/20  Yes Weaver, Scott T, PA-C  pantoprazole (PROTONIX) 40 MG tablet Take 1 tablet (40 mg total) by mouth daily. 08/31/20  Yes Paz, Alda Berthold, MD  simvastatin (ZOCOR) 10 MG tablet Take 1 tablet (10 mg total) by mouth at bedtime. 05/14/12  Yes Paz, Alda Berthold, MD  tamsulosin (FLOMAX) 0.4 MG CAPS capsule Take 1 capsule (0.4 mg total) by mouth daily after supper. 08/31/20  Yes Paz, Jacqulyn Bath E, MD  lidocaine (LIDODERM) 5 % Place 1 patch onto the skin daily. Remove & Discard patch within 12 hours or as directed by MD Patient not taking: Reported on 09/17/2020    [provider]  meclizine (ANTIVERT) 25 MG tablet Take 1 tablet (25 mg total) by mouth 2 (two) times daily as needed for dizziness. Patient not taking: Reported on 09/17/2020 06/20/15   Colon Branch, MD  omeprazole (PRILOSEC) 40 MG capsule Take 40 mg by mouth daily. Patient not taking: Reported on 09/17/2020    [provider]     Critical care time: n/a

## 2020-09-18 NOTE — Progress Notes (Signed)
SATURATION QUALIFICATIONS: (This note is used to comply with regulatory documentation for home oxygen)  Patient Saturations on Room Air at Rest = 92%  Patient Saturations on Room Air while Ambulating = 85%  Patient Saturations on 3 Liters of oxygen while Ambulating = 90%  Please briefly explain why patient needs home oxygen: pt desaturating with activity on RA and required 3L for mobility [place] Pager: [phone] Office: [phone]

## 2020-09-18 NOTE — Evaluation (Addendum)
Occupational Therapy Evaluation Patient Details Name: Christopher Hogan MRN: [id number] DOB: 10/12/1925 Today's Date: 09/18/2020    History of Present Illness 86 y/o M presenting to ED with c/o SOB, runny nose, and worsening fatigue with suspect of being Covid positive (even though Covid test was negative). PMH includes AAA, CAD, and pulmonary fibrosis, CHF, HTN   Clinical Impression   PTA pt living with wife, functioning at independent level for ADLs. Pt does endorse progressive weakness over the past weak, requiring use of RW for stabilty. At time of eval, pt completing bed mobility and sit <> stands with min A and RW to manage posterior lean. Pt reports dizziness with initial stand (BP stable), which passed after standing and stepping feet in place. Pt was then able to complete mobility to sink for seated ADL tasks. Pt required min A to safely shave face. He then completed in room mobility to recliner to end session. Pt on 2L [place] at start of session, requiring 4L [place] to maintain O2 sats >88% with ADL activity. Noted some cognitive deficits in awareness and problem solving. Given current status, recommend HHOT at d/c to safely progress ADLs in home environment. Will continue to follow per POC listed below.     Follow Up Recommendations  Home health OT;Supervision/Assistance - 24 hour    Equipment Recommendations  None recommended by OT    Recommendations for Other Services       Precautions / Restrictions Precautions Precautions: Fall Restrictions Weight Bearing Restrictions: No      Mobility Bed Mobility Overal bed mobility: Needs Assistance Bed Mobility: Supine to Sit     Supine to sit: Min assist     General bed mobility comments: light assist to pull up into sitting and to scoot hips out to EOB    Transfers Overall transfer level: Needs assistance Equipment used: Rolling walker (2 wheeled) Transfers: Sit to/from Stand Sit to Stand: Min assist         General  transfer comment: assist for posterior lean with initial stand to steady; cues needed for safe hand placement on RW    Balance Overall balance assessment: Needs assistance Sitting-balance support: Bilateral upper extremity supported;Feet supported Sitting balance-Leahy Scale: Fair     Standing balance support: Bilateral upper extremity supported;During functional activity Standing balance-Leahy Scale: Poor Standing balance comment: reliant on external support with standing ADLs                           ADL either performed or assessed with clinical judgement   ADL Overall ADL's : Needs assistance/impaired Eating/Feeding: Set up;Sitting   Grooming: Minimal assistance;Sitting Grooming Details (indicate cue type and reason): pt able to perform basic grooming tasks with set up assist; required min A to safely shave beard Upper Body Bathing: Minimal assistance;Sitting   Lower Body Bathing: Minimal assistance;Sit to/from stand;Sitting/lateral leans   Upper Body Dressing : Minimal assistance;Sitting   Lower Body Dressing: Minimal assistance;Sit to/from stand;Sitting/lateral leans   Toilet Transfer: Minimal assistance;Ambulation;RW;BSC Toilet Transfer Details (indicate cue type and reason): pt with posterior lean; min A to correct Toileting- Clothing Manipulation and Hygiene: Minimal assistance;Sitting/lateral lean;Sit to/from stand       Functional mobility during ADLs: Minimal assistance;Rolling walker       Vision Patient Visual Report: No change from baseline       Perception     Praxis      Pertinent Vitals/Pain Pain Assessment: No/denies pain  Hand Dominance     Extremity/Trunk Assessment Upper Extremity Assessment Upper Extremity Assessment: Generalized weakness   Lower Extremity Assessment Lower Extremity Assessment: Generalized weakness;Defer to PT evaluation       Communication Communication Communication: HOH   Cognition  Arousal/Alertness: Awake/alert Behavior During Therapy: WFL for tasks assessed/performed Overall Cognitive Status: Impaired/Different from baseline Area of Impairment: Orientation;Memory;Safety/judgement;Problem solving                 Orientation Level: Disoriented to;Time (unsure if his lunch meal was breakfast or dinner)   Memory: Decreased short-term memory   Safety/Judgement: Decreased awareness of deficits;Decreased awareness of safety   Problem Solving: Slow processing;Requires verbal cues General Comments: pt disoriented to time and requiring increased cues to problem solve basic ADL tasks   General Comments       Exercises     Shoulder Instructions      Home Living Family/patient expects to be discharged to:: Private residence Living Arrangements: Spouse/significant other Available Help at Discharge: Family Type of Home: House Home Access: Level entry     Home Layout: One level     Bathroom Shower/Tub: Occupational psychologist: Standard Bathroom Accessibility: Yes How Accessible: Accessible via walker Home Equipment: [place] - 2 wheels;Bedside commode;Shower seat   Additional Comments: wife uses rollator      Prior Functioning/Environment Level of Independence: Independent        Comments: was independent without AD; recently using RW due to weakness. Daughter gets groceries, cooks, Microbiologist, and does Theatre manager Problem List: Decreased strength;Impaired vision/perception;Decreased knowledge of precautions;Cardiopulmonary status limiting activity;Decreased activity tolerance;Impaired balance (sitting and/or standing)      OT Treatment/Interventions: Self-care/ADL training;Therapeutic exercise;Patient/family education;Balance training;Energy conservation;Therapeutic activities;DME and/or AE instruction    OT Goals(Current goals can be found in the care plan section) Acute Rehab OT Goals Patient Stated Goal: return home OT Goal  Formulation: With patient Time For Goal Achievement: 10/02/20 Potential to Achieve Goals: Good  OT Frequency: Min 2X/week   Barriers to D/C:            Co-evaluation              AM-PAC OT "6 Clicks" Daily Activity     Outcome Measure Help from another person eating meals?: A Little Help from another person taking care of personal grooming?: A Little Help from another person toileting, which includes using toliet, bedpan, or urinal?: A Lot Help from another person bathing (including washing, rinsing, drying)?: A Little Help from another person to put on and taking off regular upper body clothing?: A Little Help from another person to put on and taking off regular lower body clothing?: A Lot 6 Click Score: 16   End of Session Equipment Utilized During Treatment: Gait belt;Rolling walker;Oxygen Nurse Communication: Mobility status  Activity Tolerance: Patient tolerated treatment well Patient left: in chair;with call bell/phone within reach;with family/visitor present  OT Visit Diagnosis: Unsteadiness on feet (R26.81);Other abnormalities of gait and mobility (R26.89);Muscle weakness (generalized) (M62.81)                Time: 1100-1140 OT Time Calculation (min): 40 min Charges:  OT General Charges $OT Visit: 1 Visit OT Evaluation $OT Eval Moderate Complexity: 1 Mod OT Treatments $Self Care/Home Management : 23-37 mins  Zenovia Jarred, MSOT, OTR/L Acute Rehabilitation Services [facility] Office Number: [phone] Pager: [phone]  Zenovia Jarred 09/18/2020, 1:51 PM

## 2020-09-19 ENCOUNTER — Inpatient Hospital Stay (HOSPITAL_COMMUNITY): Payer: Medicare Other

## 2020-09-19 DIAGNOSIS — J9601 Acute respiratory failure with hypoxia: Secondary | ICD-10-CM | POA: Diagnosis not present

## 2020-09-19 LAB — RENAL FUNCTION PANEL
Albumin: 3 g/dL — ABNORMAL LOW (ref 3.5–5.0)
Anion gap: 12 (ref 5–15)
BUN: 17 mg/dL (ref 8–23)
CO2: 23 mmol/L (ref 22–32)
Calcium: 8.5 mg/dL — ABNORMAL LOW (ref 8.9–10.3)
Chloride: 102 mmol/L (ref 98–111)
Creatinine, Ser: 0.93 mg/dL (ref 0.61–1.24)
GFR, Estimated: >60 mL/min (ref >60)
Glucose, Bld: 142 mg/dL — ABNORMAL HIGH (ref 70–99)
Phosphorus: 3.4 mg/dL (ref 2.5–4.6)
Potassium: 4.1 mmol/L (ref 3.5–5.1)
Sodium: 137 mmol/L (ref 135–145)

## 2020-09-19 LAB — CBC
HCT: 36.4 % — ABNORMAL LOW (ref 39.0–52.0)
Hemoglobin: 11.9 g/dL — ABNORMAL LOW (ref 13.0–17.0)
MCH: 31.4 pg (ref 26.0–34.0)
MCHC: 32.7 g/dL (ref 30.0–36.0)
MCV: 96 fL (ref 80.0–100.0)
Platelets: 238 10*3/uL (ref 150–400)
RBC: 3.79 MIL/uL — ABNORMAL LOW (ref 4.22–5.81)
RDW: 14.4 % (ref 11.5–15.5)
WBC: 8.4 10*3/uL (ref 4.0–10.5)
nRBC: 0 % (ref 0.0–0.2)

## 2020-09-19 LAB — MAGNESIUM: Magnesium: 2 mg/dL (ref 1.7–2.4)

## 2020-09-19 MED ORDER — FUROSEMIDE 20 MG PO TABS
20.0000 mg | ORAL_TABLET | Freq: Every day | ORAL | Status: DC
  Administered 2020-09-19 – 2020-09-20 (×2): 20 mg via ORAL
  Filled 2020-09-19 (×2): qty 1

## 2020-09-19 NOTE — Progress Notes (Signed)
Modified Barium Swallow Progress Note  Patient Details  Name: Christopher Hogan MRN: [id number] Date of Birth: January 04, 1925  Today's Date: 09/19/2020  Modified Barium Swallow completed.  Full report located under Chart Review in the Imaging Section.  Brief recommendations include the following:  Clinical Impression  Pt was seen for MBS which revealed a mild oropharyngeal dysphagia. Although the oral phase of swallowing was largely functional, premature spillage to the level of the valleculae was observed across POs. Given consecutive sips of thin liquid, premature spillage fell to the level of the pyriforms, and an instance of frank flash penetration occurred prior to the swallow. The pharyngeal swallow was otherwise adequate. Previously noted Zenker's divertuclum was observed during the study, but did not impact swallow function. SLP educated pt on the use of small, single sips and bites. Recommend regular diet and thin liquids. No further SLP treatment is warranted at this time.   Swallow Evaluation Recommendations       SLP Diet Recommendations: Regular solids;Thin liquid   Liquid Administration via: Straw;Cup   Medication Administration: Whole meds with liquid   Supervision: Patient able to self feed   Compensations: Small sips/bites       Oral Care Recommendations: Oral care BID        Greggory Keen 09/19/2020,1:28 PM

## 2020-09-19 NOTE — Progress Notes (Signed)
Physical Therapy Treatment Patient Details Name: Christopher Hogan MRN: [id number] DOB: [birth year] Today's Date: 09/19/2020    History of Present Illness 86 y/o M presenting to ED with c/o SOB, runny nose, and worsening fatigue with suspect of being Covid positive (even though Covid test was negative). PMH includes AAA, CAD, and pulmonary fibrosis, CHF, HTN    PT Comments    Patient reports feeling better today. Tolerated gait training with use of RW for support and close min guard-Min A for safety due to unsafe gait speed and difficulty with RW navigation. Sp02 dropped to 87% on RA with activity; donned 3L and able to maintain >90%. Continues to exhibit 2/4 DOE with activity. Will likely need supplemental 02 at d/c. Pt reports his family got him a rollator for home and he will use it for safety. Will follow.    Follow Up Recommendations  Home health PT;Supervision for mobility/OOB     Equipment Recommendations  None recommended by PT    Recommendations for Other Services       Precautions / Restrictions Precautions Precautions: Fall;Other (comment) Precaution Comments: watch 02 Restrictions Weight Bearing Restrictions: No    Mobility  Bed Mobility               General bed mobility comments: up in chair upon arrival  Transfers Overall transfer level: Needs assistance Equipment used: Rolling walker (2 wheeled) Transfers: Sit to/from Stand Sit to Stand: Min guard         General transfer comment: Min guard for safety. Stood from chair x1, returned to sitting EOB post walk to eat lunch.  Ambulation/Gait Ambulation/Gait assistance: Min assist;Min guard Gait Distance (Feet): 200 Feet Assistive device: Rolling walker (2 wheeled) Gait Pattern/deviations: Step-through pattern;Decreased stride length;Trunk flexed Gait velocity: unsafe gait speed   General Gait Details: Mildly unsteady gait with difficulty navigating RW in hallway needing cues and assist esp with  turns. Sp02 dropped to 87% on RA, needed 3L to maintain >90%.   Stairs             Wheelchair Mobility    Modified [place] (Stroke Patients Only)       Balance Overall balance assessment: Needs assistance Sitting-balance support: Feet supported;No upper extremity supported Sitting balance-Leahy Scale: Fair     Standing balance support: During functional activity Standing balance-Leahy Scale: Poor Standing balance comment: reliant on external support with ambulation                            Cognition Arousal/Alertness: Awake/alert Behavior During Therapy: WFL for tasks assessed/performed Overall Cognitive Status: Impaired/Different from baseline Area of Impairment: Safety/judgement;Awareness                     Memory: Decreased short-term memory   Safety/Judgement: Decreased awareness of deficits;Decreased awareness of safety Awareness: Emergent Problem Solving: Slow processing;Requires verbal cues General Comments: Poor safety awareness when using RW.      Exercises      General Comments        Pertinent Vitals/Pain Pain Assessment: No/denies pain    Home Living                      Prior Function            PT Goals (current goals can now be found in the care plan section) Acute Rehab PT Goals Patient Stated Goal: return home Progress towards PT goals: Progressing  toward goals    Frequency    Min 3X/week      PT Plan Current plan remains appropriate    Co-evaluation              AM-PAC PT "6 Clicks" Mobility   Outcome Measure  Help needed turning from your back to your side while in a flat bed without using bedrails?: A Little Help needed moving from lying on your back to sitting on the side of a flat bed without using bedrails?: A Little Help needed moving to and from a bed to a chair (including a wheelchair)?: A Little Help needed standing up from a chair using your arms (e.g., wheelchair or  bedside chair)?: A Little Help needed to walk in hospital room?: A Little Help needed climbing 3-5 steps with a railing? : A Lot 6 Click Score: 17    End of Session Equipment Utilized During Treatment: Gait belt;Oxygen Activity Tolerance: Patient tolerated treatment well;Treatment limited secondary to medical complications (Comment) (drop in SP02) Patient left: in bed;with call bell/phone within reach;with bed alarm set (sitting EOB eating lunch) Nurse Communication: Mobility status PT Visit Diagnosis: Other abnormalities of gait and mobility (R26.89);Muscle weakness (generalized) (M62.81);Difficulty in walking, not elsewhere classified (R26.2)     Time: 7416-3845 PT Time Calculation (min) (ACUTE ONLY): 18 min  Charges:  $Gait Training: 8-22 mins                     Marisa Severin, PT, DPT Acute Rehabilitation Services Pager [phone] Office [place] 09/19/2020, 12:40 PM

## 2020-09-19 NOTE — Progress Notes (Signed)
PROGRESS NOTE  Christopher Hogan BZJ:[id number] DOB: 05-15-1925   PCP: Colon Branch, MD  Patient is from: Home  DOA: 09/16/2020 LOS: 3  Chief complaints: Shortness of breath  Brief Narrative / Interim history: 86 year old male with PMH of CAD, pulmonary fibrosis?,  AAA, systolic CHF, HTN and postnasal drainage presenting with progressive dyspnea, fatigue and runny nose.    In ED, vital signs within normal except for elevated respiratory rate to mid and upper 20s.  94% on RA.  WBC 8.1.  Hgb 12.7. Na 134.  BNP 389.  Troponin negative x2.  D-dimer 3.46.  CTA chest negative for PE but multifocal pneumonia concerning for viral etiology.  Mildly elevated inflammatory markers including CRP and LDH as well.  COVID-19, influenza A and B and RSV negative x2.  Empirically started on azithromycin and Zosyn for community-acquired pneumonia and admitted for "hypoxemic respiratory failure".   Procalcitonin negative.  Discontinued IV Zosyn.  Desaturated to 86% at rest and 82% with ambulation requiring 4 L to recover to 88%. TTE with EF of 35 to 40% and RWMA with hypokinesis of inferolateral wall and basal mid lateral wall.  There is no prior echo in his chart of care everywhere to compare to but history of systolic CHF with EF of 01-75% per last cardiology note in 11/2019. Siscussed TTE with on-call cardiologist, Dr. Radford Pax who recommended outpatient follow-up with his cardiologist, Dr. Meda Coffee.  He already has an upcoming appointment next week.  PCCM consulted about respiratory failure.  Started on trial of IV Lasix.   Subjective: Seen and examined earlier this morning.  No major events overnight of this morning.  No complaints.  He says he feels better but continues to require supplemental oxygen to maintain appropriate saturation.  Still with some intermittent cough.   Objective: Vitals:   09/18/20 1655 09/18/20 1933 09/18/20 2318 09/19/20 0400  BP:  118/67 117/65 112/61  Pulse: 73 91 70 (!) 57  Resp:  20 (!) 28 20 20   Temp: (!) 97.5 F (36.4 C) 97.6 F (36.4 C) 97.8 F (36.6 C) 97.9 F (36.6 C)  TempSrc: Oral Oral Oral Oral  SpO2:  94% 98% 97%  Weight:    62.7 kg  Height:        Intake/Output Summary (Last 24 hours) at 09/19/2020 1034 Last data filed at 09/18/2020 2100 Gross per 24 hour  Intake --  Output 1700 ml  Net -1700 ml   Filed Weights   09/16/20 1208 09/17/20 0005 09/19/20 0400  Weight: 68.5 kg 61.6 kg 62.7 kg    Examination:  GENERAL: No apparent distress.  Nontoxic. HEENT: MMM.  Vision and hearing grossly intact.  NECK: Supple.  No apparent JVD.  RESP: 95% on 3 L at rest.  No IWOB.  Bilateral crackles. CVS:  RRR. Heart sounds normal.  ABD/GI/GU: BS+. Abd soft, NTND.  MSK/EXT:  Moves extremities. No apparent deformity. No edema.  SKIN: no apparent skin lesion or wound NEURO: Awake, alert and oriented appropriately.  No apparent focal neuro deficit. PSYCH: Calm. Normal affect.  Procedures:  None  Microbiology summarized: COVID-19 PCR negative x2. Influenza A and B, and RSV negative x2. RVP negative.  Assessment & Plan: Acute hypoxemic respiratory failure/dyspnea/cough: CTA chest negative for PE but multifocal pneumonia consistent with viral etiology.  Could also be atypical pneumonia.  However, no leukocytosis, fever or elevated procalcitonin. COVID-19 PCR, influenza and RSV negative x2.  Broad RVP panel negative. He has elevated BNP to 380s but no prior  to compare to.  He does not appear fluid overloaded.  TTE as below.  Some mention about risk for pulmonary fibrosis but not sure the basis of this.  Has bilateral crackles on exam.  Continues to require supplemental oxygen -Discontinued IV Zosyn on 10/30 -Continue azithromycin and prednisone 10/29 >>for a total of 5 days -Wean oxygen as able, incentive spirometry, OOB, PT/OT -Continue Lasix -Wean oxygen as able -Incentive spirometry, OOB, PT/OT and daily ambulation  -Appreciate PCCM input-will arrange  outpatient follow-up for repeat CT chest and further evaluation.  Chronic systolic CHF: TTE with EF of 35 to 40% and RWMA with hypokinesis of inferolateral wall and basal mid lateral wall.  There is no prior echo in his chart of care everywhere to compare to but history of systolic CHF with EF of 71-69% per last cardiology note in 11/2019.  He sees Dr. Meda Coffee outpatient.  He has upcoming appointment next week.  Discussed TTE finding with on-call cardiologist, Dr. Radford Pax who recommended outpatient evaluation.  He has slightly elevated BNP to 380s but appears euvolemic other than crackles which could be due to pneumonia.  Not on diuretics at home.  He has some orthostasis but orthostatic vitals negative.  Good urine output with IV Lasix.  BP and renal function relatively stable. -P.o. Lasix 20 mg daily -Monitor fluid status and renal function -Changed atenolol to metoprolol XL.  -Defer further GDMT to his cardiologist-has upcoming appointment  Essential hypertension: Normotensive.  -Continue metoprolol XL and Lasix -Discontinue atenolol and amlodipine.  Prefer GDMT for CHF   Orthostasis-reports postural orthostasis.  Ongoing issue for about a month. He is on multiple antihypertensive meds.  Some drop in SBP with standing at 0-minute but recovers at 3 minutes.  TSH within normal. -Apply TED hose  Elevated D-dimer: CTA chest negative for PE.  LE [place] negative for DVT.  CAD/history of inferior wall MI: No ischemia on his stress test in 2017.  No chest pain. -Continue home medications  Normocytic anemia: H&H relatively stable.  Anemia panel basically normal. -Monitor intermittently  Mild hyponatremia: Resolved.  Postnasal drainage -Loratadine and Flonase spray  Constipation -As needed MiraLAX and Senokot-S  GERD -Continue PPI while on steroid  BPH without LUTS -Continue home Flomax  Body mass index is 21.65 kg/m.         DVT prophylaxis:  Place TED hose Start: 09/18/20  1042 heparin injection 5,000 Units Start: 09/16/20 1730 SCDs Start: 09/16/20 1713  Code Status: Full code Family Communication: Updated patient's wife over the phone on 10/31. Status is: Inpatient   Remains inpatient appropriate because:Unsafe d/c plan, Inpatient level of care appropriate due to severity of illness and Ongoing hypoxemic respiratory failure   Dispo: The patient is from: Home              Anticipated d/c is to: Home              Anticipated d/c date is: 1 day              Patient currently is not medically stable to d/c.       Consultants:  Cardiology over the phone PCCM   Sch Meds:  Scheduled Meds: . vitamin C  500 mg Oral Daily  . aspirin  81 mg Oral Daily  . azelastine  2 spray Each Nare BID  . dorzolamide-timolol  1 drop Both Eyes BID  . fenofibrate  54 mg Oral Daily  . fluticasone  1 spray Each Nare Daily  .  furosemide  20 mg Oral Daily  . heparin  5,000 Units Subcutaneous Q8H  . isosorbide mononitrate  15 mg Oral Daily  . latanoprost  1 drop Right Eye QHS  . loratadine  10 mg Oral Daily  . methylPREDNISolone (SOLU-MEDROL) injection  1 mg/kg Intravenous Q12H   Followed by  . predniSONE  50 mg Oral Daily  . metoprolol succinate  50 mg Oral Daily  . pantoprazole  40 mg Oral Daily  . simvastatin  10 mg Oral QHS  . tamsulosin  0.4 mg Oral QPC supper  . zinc sulfate  220 mg Oral Daily   Continuous Infusions: . azithromycin 500 mg (09/18/20 2214)   PRN Meds:.albuterol, chlorpheniramine-HYDROcodone, guaiFENesin-dextromethorphan, HYDROcodone-acetaminophen, Ipratropium-Albuterol, polyethylene glycol, senna-docusate  Antimicrobials: Anti-infectives (From admission, onward)   Start     Dose/Rate Route Frequency Ordered Stop   09/16/20 2200  piperacillin-tazobactam (ZOSYN) IVPB 3.375 g  Status:  Discontinued        3.375 g 12.5 mL/hr over 240 Minutes Intravenous Every 8 hours 09/16/20 2007 09/17/20 1100   09/16/20 2130  azithromycin (ZITHROMAX) 500  mg in sodium chloride 0.9 % 250 mL IVPB        500 mg 250 mL/hr over 60 Minutes Intravenous Every 24 hours 09/16/20 1933 09/21/20 2129       I have personally reviewed the following labs and images: CBC: Recent Labs  Lab 09/16/20 1216 09/17/20 0310 09/18/20 0109 09/19/20 0512  WBC 8.1 7.2 8.2 8.4  NEUTROABS 6.2 6.5  --   --   HGB 12.7* 12.5* 10.7* 11.9*  HCT 39.7 37.6* 32.7* 36.4*  MCV 97.5 95.7 94.2 96.0  PLT 264 228 249 238   BMP &GFR Recent Labs  Lab 09/16/20 1216 09/16/20 1545 09/17/20 0310 09/18/20 0109 09/19/20 0512  NA 134*  --  135 132* 137  K 4.1  --  4.9 4.0 4.1  CL 100  --  101 100 102  CO2 24  --  24 23 23   GLUCOSE 121*  --  150* 128* 142*  BUN 11  --  12 17 17   CREATININE 0.96  --  1.00 0.87 0.93  CALCIUM 8.8*  --  8.9 8.6* 8.5*  MG  --  1.9 1.9 2.0 2.0  PHOS  --  3.3 3.7 2.9 3.4   Estimated Creatinine Clearance: 44.9 mL/min (by C-G formula based on SCr of 0.93 mg/dL). Liver & Pancreas: Recent Labs  Lab 09/16/20 1216 09/17/20 0310 09/18/20 0109 09/19/20 0512  AST 38 39  --   --   ALT 34 39  --   --   ALKPHOS 59 62  --   --   BILITOT 1.1 1.2  --   --   PROT 6.2* 6.0*  --   --   ALBUMIN 3.3* 3.2* 2.8* 3.0*   No results for input(s): LIPASE, AMYLASE in the last 168 hours. No results for input(s): AMMONIA in the last 168 hours. Diabetic: No results for input(s): HGBA1C in the last 72 hours. No results for input(s): GLUCAP in the last 168 hours. Cardiac Enzymes: Recent Labs  Lab 09/17/20 0310  CKTOTAL 58   No results for input(s): PROBNP in the last 8760 hours. Coagulation Profile: No results for input(s): INR, PROTIME in the last 168 hours. Thyroid Function Tests: Recent Labs    09/16/20 1545 09/16/20 1545 09/18/20 1318  TSH 2.903   < > 1.535  FREET4 0.90  --   --    < > = values  in this interval not displayed.   Lipid Profile: No results for input(s): CHOL, HDL, LDLCALC, TRIG, CHOLHDL, LDLDIRECT in the last 72 hours. Anemia  Panel: Recent Labs    09/17/20 0310 09/18/20 1318  VITAMINB12  --  1,141*  FOLATE  --  20.5  FERRITIN 224 246  TIBC  --  231*  IRON  --  84  RETICCTPCT  --  3.0   Urine analysis:    Component Value Date/Time   COLORURINE YELLOW 09/16/2020 1207   APPEARANCEUR CLOUDY (A) 09/16/2020 1207   LABSPEC 1.016 09/16/2020 1207   PHURINE 8.0 09/16/2020 [place] 09/16/2020 1207   [place] 09/16/2020 [place] 09/16/2020 [place] 09/16/2020 [place] 09/16/2020 1207   NITRITE NEGATIVE 09/16/2020 1207   LEUKOCYTESUR NEGATIVE 09/16/2020 1207   Sepsis Labs: Invalid input(s): PROCALCITONIN, [place]  Microbiology: Recent Results (from the past 240 hour(s))  Respiratory Panel by RT PCR (Flu A&B, Covid) - Nasopharyngeal Swab     Status: None   Collection Time: 09/16/20 12:16 PM   Specimen: Nasopharyngeal Swab  Result Value Ref Range Status   SARS Coronavirus 2 by RT PCR NEGATIVE NEGATIVE Final    Comment: (NOTE) SARS-CoV-2 target nucleic acids are NOT DETECTED.  The SARS-CoV-2 RNA is generally detectable in upper respiratoy specimens during the acute phase of infection. The lowest concentration of SARS-CoV-2 viral copies this assay can detect is 131 copies/mL. A negative result does not preclude SARS-Cov-2 infection and should not be used as the sole basis for treatment or other patient management decisions. A negative result may occur with  improper specimen collection/handling, submission of specimen other than nasopharyngeal swab, presence of viral mutation(s) within the areas targeted by this assay, and inadequate number of viral copies (<131 copies/mL). A negative result must be combined with clinical observations, patient history, and epidemiological information. The expected result is Negative.  Fact Sheet for Patients:  PinkCheek.be  Fact Sheet for Healthcare Providers:   GravelBags.it  This test is no t yet approved or cleared by the [place] FDA and  has been authorized for detection and/or diagnosis of SARS-CoV-2 by FDA under an Emergency Use Authorization (EUA). This EUA will remain  in effect (meaning this test can be used) for the duration of the COVID-19 declaration under Section 564(b)(1) of the Act, 21 U.S.C. section 360bbb-3(b)(1), unless the authorization is terminated or revoked sooner.     Influenza A by PCR NEGATIVE NEGATIVE Final   Influenza B by PCR NEGATIVE NEGATIVE Final    Comment: (NOTE) The Xpert Xpress SARS-CoV-2/FLU/RSV assay is intended as an aid in  the diagnosis of influenza from Nasopharyngeal swab specimens and  should not be used as a sole basis for treatment. Nasal washings and  aspirates are unacceptable for Xpert Xpress SARS-CoV-2/FLU/RSV  testing.  Fact Sheet for Patients: PinkCheek.be  Fact Sheet for Healthcare Providers: GravelBags.it  This test is not yet approved or cleared by the [place] FDA and  has been authorized for detection and/or diagnosis of SARS-CoV-2 by  FDA under an Emergency Use Authorization (EUA). This EUA will remain  in effect (meaning this test can be used) for the duration of the  Covid-19 declaration under Section 564(b)(1) of the Act, 21  U.S.C. section 360bbb-3(b)(1), unless the authorization is  terminated or revoked. Performed at [facility], [place] [address] [place], [place] [address]   Respiratory Panel by RT PCR (Flu A&B,  Covid) - Nasopharyngeal Swab     Status: None   Collection Time: 09/16/20  7:44 PM   Specimen: Nasopharyngeal Swab  Result Value Ref Range Status   SARS Coronavirus 2 by RT PCR NEGATIVE NEGATIVE Final    Comment: (NOTE) SARS-CoV-2 target nucleic acids are NOT DETECTED.  The SARS-CoV-2 RNA is generally detectable in upper respiratoy specimens during the  acute phase of infection. The lowest concentration of SARS-CoV-2 viral copies this assay can detect is 131 copies/mL. A negative result does not preclude SARS-Cov-2 infection and should not be used as the sole basis for treatment or other patient management decisions. A negative result may occur with  improper specimen collection/handling, submission of specimen other than nasopharyngeal swab, presence of viral mutation(s) within the areas targeted by this assay, and inadequate number of viral copies (<131 copies/mL). A negative result must be combined with clinical observations, patient history, and epidemiological information. The expected result is Negative.  Fact Sheet for Patients:  PinkCheek.be  Fact Sheet for Healthcare Providers:  GravelBags.it  This test is no t yet approved or cleared by the [place] FDA and  has been authorized for detection and/or diagnosis of SARS-CoV-2 by FDA under an Emergency Use Authorization (EUA). This EUA will remain  in effect (meaning this test can be used) for the duration of the COVID-19 declaration under Section 564(b)(1) of the Act, 21 U.S.C. section 360bbb-3(b)(1), unless the authorization is terminated or revoked sooner.     Influenza A by PCR NEGATIVE NEGATIVE Final   Influenza B by PCR NEGATIVE NEGATIVE Final    Comment: (NOTE) The Xpert Xpress SARS-CoV-2/FLU/RSV assay is intended as an aid in  the diagnosis of influenza from Nasopharyngeal swab specimens and  should not be used as a sole basis for treatment. Nasal washings and  aspirates are unacceptable for Xpert Xpress SARS-CoV-2/FLU/RSV  testing.  Fact Sheet for Patients: PinkCheek.be  Fact Sheet for Healthcare Providers: GravelBags.it  This test is not yet approved or cleared by the [place] FDA and  has been authorized for detection and/or diagnosis  of SARS-CoV-2 by  FDA under an Emergency Use Authorization (EUA). This EUA will remain  in effect (meaning this test can be used) for the duration of the  Covid-19 declaration under Section 564(b)(1) of the Act, 21  U.S.C. section 360bbb-3(b)(1), unless the authorization is  terminated or revoked. Performed at [facility], [place] [address] [place], [place] [address]   Respiratory Panel by PCR     Status: None   Collection Time: 09/17/20 12:45 PM   Specimen: Nasopharyngeal Swab; Respiratory  Result Value Ref Range Status   Adenovirus NOT DETECTED NOT DETECTED Final   Coronavirus 229E NOT DETECTED NOT DETECTED Final    Comment: (NOTE) The Coronavirus on the Respiratory Panel, DOES NOT test for the novel  Coronavirus (2019 nCoV)    Coronavirus HKU1 NOT DETECTED NOT DETECTED Final   Coronavirus NL63 NOT DETECTED NOT DETECTED Final   Coronavirus OC43 NOT DETECTED NOT DETECTED Final   Metapneumovirus NOT DETECTED NOT DETECTED Final   Rhinovirus / Enterovirus NOT DETECTED NOT DETECTED Final   Influenza A NOT DETECTED NOT DETECTED Final   Influenza B NOT DETECTED NOT DETECTED Final   Parainfluenza Virus 1 NOT DETECTED NOT DETECTED Final   Parainfluenza Virus 2 NOT DETECTED NOT DETECTED Final   Parainfluenza Virus 3 NOT DETECTED NOT DETECTED Final   Parainfluenza Virus 4 NOT DETECTED NOT DETECTED Final   Respiratory Syncytial Virus NOT  DETECTED NOT DETECTED Final   Bordetella pertussis NOT DETECTED NOT DETECTED Final   Chlamydophila pneumoniae NOT DETECTED NOT DETECTED Final   Mycoplasma pneumoniae NOT DETECTED NOT DETECTED Final    Comment: Performed at [facility], [place] [address] [place], [place] [address]    Radiology Studies: No results found.    Nike Southers T. [place]  If 7PM-7AM, please contact night-coverage [email or web address] 09/19/2020, 10:34 AM

## 2020-09-19 NOTE — Progress Notes (Signed)
NAME:  Christopher Hogan, MRN:  [id number], DOB:  [birth year], LOS: 3 ADMISSION DATE:  09/16/2020, CONSULTATION DATE:  10/31 REFERRING MD:  Cyndia Skeeters, CHIEF COMPLAINT:  Abnormal ct chest and exertional hypoxia    Brief History   86 year old with reduced EF who presents with 2 weeks of worsening cough and mild DOE whom we are seeing in consultation for evaluation of exertional hypoxemia and abnormal CT scan. PCP note 05/2020 reviewed as well as notes this hospitalization.   History of present illness   Patient noted he had bronchitis 3-4 months ago. Started with a cough that naggingly persisted. Got a week of an antibiotic (not sure of name) and cough improved. However, over the last several weeks, his cough has returned. It is worse the last 2 weeks. Always worse when eating. Recent speech eval with silent aspiration. Recent symptoms associated with poor appetite, not eating much. Gradually the day or two prior developed DOE. No SOB at rest. No orthopnea or PND. He walked in his house and felt like he was going to pass out. He went to bed room and rested and this sensation resolved. That prompted presentation to ED.   In ED, no leukocytosis or fever. No eosinophilia (abs Eos 0). BNP elevated to ~390. CXR with what appears to be L lower infiltrate, low lung volumes on my interpretation. There was concern for PE which prompted CTA chest which revealed on my interpretation scattered central GGOs most prominent in bilateral upper lobes, motion artifact limits evaluation of lower lobes but look relatively clear. He was started on abx for CAP. His O2 sat on room air at rest in low 90s during our conversation. Noted to desaturate with exertion requiring O2 which is new. TTE this admission with reduced EF, no LA dilation, mild MR.  Past Medical History  CAD, AAA, CHF, HTN,   Significant Hospital Events   Admitted 10/30  Consults:  Pulmonary   Procedures:  n/a  Significant Diagnostic Tests:  As  above  Micro Data:  COVID negative, RVP negative  Antimicrobials:  Azithromycin 10/30 >>  Interim history/subjective:  Reports breathing is improved. Oxygen requirement down to 1L  Objective   Blood pressure (!) 146/84, pulse 68, temperature 97.8 F (36.6 C), temperature source Oral, resp. rate 20, height 5\' 7"  (1.702 m), weight 62.7 kg, SpO2 97 %.        Intake/Output Summary (Last 24 hours) at 09/19/2020 1702 Last data filed at 09/18/2020 2100 Gross per 24 hour  Intake --  Output 1000 ml  Net -1000 ml   Filed Weights   09/16/20 1208 09/17/20 0005 09/19/20 0400  Weight: 68.5 kg 61.6 kg 62.7 kg   Physical Exam: General: Elderly, frail, no acute distress HENT: [place], AT, OP clear, MMM Eyes: EOMI, no scleral icterus Respiratory: Diminished breath sounds bilaterally.  No crackles, wheezing or rales Cardiovascular: RRR, -M/R/G, no JVD Extremities:-Edema,-tenderness Neuro: AAO x4, CNII-XII grossly intact Skin: Intact, no rashes or bruising Psych: Normal mood, normal affect  Resolved Hospital Problem list   n/a  Assessment & Plan:  Exertional hypoxemia with abnormal CT scan: Predominantly central bilateral upper lobe GGOs. Differential is wide and include viral or atypical pneumonia, pulmonary edema, or other non-infectious pna (eos pna, HSP, organizing pna). His presentation is subacute which could explain the negative RVP and COVID tests. Most likely this is sequelae of viral illness that is too low to detect now on PCR. Atypical bacterial infection is also possible. Finally, his cough is  worse with eating and has been observed to aspirate on formal speech evaluation. Infiltrates could represent aspiration pneumonitis, upper lobes if occurred supine.  --Complete azithromycin x 5 days --Diureses PRN --Recommend repeat CT scan in 6-8 weeks to evaluate for resolution of infiltrates, will arrange to be seen with Dr. Silas Flood in Pulmonary clinic.  Best practice:  Per  primary  Labs   CBC: Recent Labs  Lab 09/16/20 1216 09/17/20 0310 09/18/20 0109 09/19/20 0512  WBC 8.1 7.2 8.2 8.4  NEUTROABS 6.2 6.5  --   --   HGB 12.7* 12.5* 10.7* 11.9*  HCT 39.7 37.6* 32.7* 36.4*  MCV 97.5 95.7 94.2 96.0  PLT 264 228 249 235    Basic Metabolic Panel: Recent Labs  Lab 09/16/20 1216 09/16/20 1545 09/17/20 0310 09/18/20 0109 09/19/20 0512  NA 134*  --  135 132* 137  K 4.1  --  4.9 4.0 4.1  CL 100  --  101 100 102  CO2 24  --  24 23 23   GLUCOSE 121*  --  150* 128* 142*  BUN 11  --  12 17 17   CREATININE 0.96  --  1.00 0.87 0.93  CALCIUM 8.8*  --  8.9 8.6* 8.5*  MG  --  1.9 1.9 2.0 2.0  PHOS  --  3.3 3.7 2.9 3.4   GFR: Estimated Creatinine Clearance: 44.9 mL/min (by C-G formula based on SCr of 0.93 mg/dL). Recent Labs  Lab 09/16/20 1216 09/16/20 1545 09/17/20 0310 09/18/20 0109 09/18/20 1111 09/19/20 0512  PROCALCITON  --   --  <0.10  --  <0.10  --   WBC 8.1  --  7.2 8.2  --  8.4  LATICACIDVEN  --  1.6  --   --   --   --     Liver Function Tests: Recent Labs  Lab 09/16/20 1216 09/17/20 0310 09/18/20 0109 09/19/20 0512  AST 38 39  --   --   ALT 34 39  --   --   ALKPHOS 59 62  --   --   BILITOT 1.1 1.2  --   --   PROT 6.2* 6.0*  --   --   ALBUMIN 3.3* 3.2* 2.8* 3.0*   No results for input(s): LIPASE, AMYLASE in the last 168 hours. No results for input(s): AMMONIA in the last 168 hours.  ABG No results found for: PHART, PCO2ART, PO2ART, HCO3, TCO2, ACIDBASEDEF, O2SAT   Coagulation Profile: No results for input(s): INR, PROTIME in the last 168 hours.  Cardiac Enzymes: Recent Labs  Lab 09/17/20 0310  CKTOTAL 58    HbA1C: Hgb A1c MFr Bld  Date/Time Value Ref Range Status  09/19/2011 11:40 AM 4.9 4.6 - 6.5 % Final    Comment:    Glycemic Control Guidelines for People with Diabetes:Non Diabetic:  <6%Goal of Therapy: <7%Additional Action Suggested:  >8%     CBG: No results for input(s): GLUCAP in the last 168  hours.   Care Time: 25 min    Reviewed prior documentation, coordinating care and discussing medical diagnosis and plan with the patient/family. Imaging, labs and tests included in this note have been reviewed and interpreted independently by me.  Rodman Pickle, M.D. [facility] Pulmonary/Critical Care Medicine 09/19/2020 5:03 PM

## 2020-09-19 NOTE — Evaluation (Signed)
Clinical/Bedside Swallow Evaluation Patient Details  Name: Christopher Hogan MRN: [id number] Date of Birth: 1925-09-17  Today's Date: 09/19/2020 Time: SLP Start Time (ACUTE ONLY): 0930 SLP Stop Time (ACUTE ONLY): 0957 SLP Time Calculation (min) (ACUTE ONLY): 27 min  Past Medical History:  Past Medical History:  Diagnosis Date  . AAA (abdominal aortic aneurysm) ([place])    repaired 02/2002, renal ultrasound, Sept 2010: normal abdominal aorta, normal renal arteries   . Age-related macular degeneration, wet, left eye ([place])   . Allergic rhinitis   . Arthritis   . At risk for pulmonary fibrosis 06/25/2018  . CAD (coronary artery disease)    stable stress test 7/11  . CHF (congestive heart failure) ([place])   . Chronic systolic CHF (congestive heart failure) ([place]) 12/17/2013  . Coronary atherosclerosis 06/09/2007   H/O inferior wall MI- PTCA- 1989 Last stress test 2/17- inferior scar, no ischemia    . DEGENERATIVE JOINT DISEASE 03/17/2010   Qualifier: Diagnosis of  By: Dawson Bills    . Dyslipidemia 05/06/2007   LDL 59 Jan 2019     . Eczema 07/09/2013  . ED (erectile dysfunction)   . Epidermoid cyst of skin of chest 09/29/2018  . ERECTILE DYSFUNCTION 12/09/2007   Qualifier: Diagnosis of  By: Larose Kells MD, [place] hypertension 04/30/2007   Qualifier: Diagnosis of  By: Larose Kells MD, [place] GERD 07/21/2008   Qualifier: Diagnosis of  By: Larose Kells MD, [place] GERD (gastroesophageal reflux disease)   . Glaucoma and macular degeneration 12/31/2011  . Hyperlipidemia   . Hypertension   . Inguinal hernia 12/31/2011  . Insomnia 02/22/2018  . Ischemic cardiomyopathy 06/23/2018   Last EF 30-44% by Myoview Feb 2017  . Left inguinal hernia 06/30/2018  . Myocardial infarction ([place]) 1989   interior wall infarction  . PCP notes >>>>>>>>>>>>>>>>>>>> 07/17/2016  . Pneumonia 2019  . Pre-operative cardiovascular examination 12/31/2011  . Rhinitis 04/20/2008   Qualifier: Diagnosis of  By: Larose Kells MD, [place] Vertigo  06/20/2015   Past Surgical History:  Past Surgical History:  Procedure Laterality Date  . AAA repair  2003  . CARDIAC CATHETERIZATION    . CHOLECYSTECTOMY    . INGUINAL HERNIA REPAIR Right 1985  . INGUINAL HERNIA REPAIR Left 06/30/2018   open; w/mesh  . INGUINAL HERNIA REPAIR Left 06/30/2018   Procedure: OPEN LEFT INGUINAL HERNIA REPAIR;  Surgeon: Fanny Skates, MD;  Location: [place];  Service: General;  Laterality: Left;  . INSERTION OF MESH Left 06/30/2018   Procedure: INSERTION OF MESH;  Surgeon: Fanny Skates, MD;  Location: [facility];  Service: General;  Laterality: Left;  [place] Kitchen MASS EXCISION N/A 09/29/2018   Procedure: EXCISION OF 3 CM MASS ANTERIOR CHEST WALL;  Surgeon: Fanny Skates, MD;  Location: [facility];  Service: General;  Laterality: N/A;  . PTCA  1989, [id number]   HPI:  86 year old male with PMH of CAD, pulmonary fibrosis, AAA, systolic CHF, HTN and postnasal drainage presenting with progressive dyspnea, fatigue and runny nose. CTA chest negative for PE but multifocal pneumonia concerning for viral etiology. Pt has history of dysphagia and MBS 6/21: mild delay in swallow initiation resulting in one instance of silent aspiration before the swallow with thin liquids. Pt also found to have Zenker's diverticulum that did not impact swallow function. Recommended regular/thin with chin tuck.   Assessment / Plan / Recommendation Clinical Impression  Pt was seen for BSE and  was alert and cooperative. MBS done in 04/2020 made recommendations for use of chin tuck and small sips, but pt did not demonstrate use/awareness of these strategies in today's evaluation. Following 3 oz of thin liquid, he demonstrated immediate throat clear and coughing. No s/sx of aspiration were observed with puree or solid. Given the pt's decreased awareness of compensatory strategies, history of dysphagia, current presentation of PNA, and s/sx of aspiration at bedside, SLP will f/u with instrumental  testing.  SLP Visit Diagnosis: Dysphagia, unspecified (R13.10)    Aspiration Risk  Moderate aspiration risk    Diet Recommendation Regular;Thin liquid   Liquid Administration via: Straw Medication Administration: Whole meds with liquid Supervision: Patient able to self feed Compensations: Small sips/bites Postural Changes: Seated upright at 90 degrees    Other  Recommendations Oral Care Recommendations: Oral care BID   Follow up Recommendations        Frequency and Duration min 2x/week  2 weeks       Prognosis Prognosis for Safe Diet Advancement: Fair      Swallow Study   General HPI: 86 year old male with PMH of CAD, pulmonary fibrosis, AAA, systolic CHF, HTN and postnasal drainage presenting with progressive dyspnea, fatigue and runny nose. CTA chest negative for PE but multifocal pneumonia concerning for viral etiology. Pt has history of dysphagia and MBS 6/21: mild delay in swallow initiation resulting in one instance of silent aspiration before the swallow with thin liquids. Pt also found to have Zenker's diverticulum that did not impact swallow function. Recommended regular/thin with chin tuck. Type of Study: Bedside Swallow Evaluation Previous Swallow Assessment: MBS 04/2020 Diet Prior to this Study: Regular;Thin liquids Temperature Spikes Noted: No Respiratory Status: Nasal cannula History of Recent Intubation: No Behavior/Cognition: Alert;Cooperative;Pleasant mood;Distractible Oral Cavity Assessment: Within Functional Limits Oral Care Completed by SLP: No Oral Cavity - Dentition: Dentures, not available;Edentulous Vision: Functional for self-feeding Self-Feeding Abilities: Able to feed self Patient Positioning: Upright in bed Baseline Vocal Quality: Hoarse    Oral/Motor/Sensory Function Overall Oral Motor/Sensory Function: Within functional limits   Ice Chips Ice chips: Not tested   Thin Liquid Thin Liquid: Impaired Presentation: Straw Pharyngeal  Phase  Impairments: Cough - Immediate;Throat Clearing - Immediate    Nectar Thick Nectar Thick Liquid: Not tested   Honey Thick Honey Thick Liquid: Not tested   Puree Puree: Within functional limits Presentation: Spoon;Self Fed   Solid     Solid: Within functional limits Presentation: Self Fed      Greggory Keen 09/19/2020,10:09 AM

## 2020-09-19 NOTE — Progress Notes (Signed)
Occupational Therapy Treatment Patient Details Name: Christopher Hogan MRN: [id number] DOB: [birth year] Today's Date: 09/19/2020    History of present illness 86 y/o M presenting to ED with c/o SOB, runny nose, and worsening fatigue with suspect of being Covid positive (even though Covid test was negative). PMH includes AAA, CAD, and pulmonary fibrosis, CHF, HTN   OT comments  Patient continues to make steady progress towards goals in skilled OT session. Patient's session encompassed functional mobility to improve overall activity tolerance to complete ADLs. Pt received in chair after completing ADLs with nurse tech. Pt able to ambulate in hallway on 1L of O2 with no rest breaks, with O2 reading at 88% upon return to the room, bumped to 2L with numbers reading at 95% at end of session. Pt reliant on cues for safety and walker management when ambulation, often unaware of environmental surroundings without running into them. Discharge remains appropriate, will continue to follow acutely.     Follow Up Recommendations  Home health OT;Supervision/Assistance - 24 hour    Equipment Recommendations  None recommended by OT    Recommendations for Other Services      Precautions / Restrictions Precautions Precautions: Fall Restrictions Weight Bearing Restrictions: No       Mobility Bed Mobility               General bed mobility comments: up in chair upon arrival  Transfers Overall transfer level: Needs assistance Equipment used: Rolling walker (2 wheeled) Transfers: Sit to/from Stand Sit to Stand: Min guard         General transfer comment: for safety    Balance Overall balance assessment: Needs assistance Sitting-balance support: Bilateral upper extremity supported;Feet supported Sitting balance-Leahy Scale: Fair     Standing balance support: Bilateral upper extremity supported;During functional activity Standing balance-Leahy Scale: Poor Standing balance comment:  reliant on external support with ambulation                           ADL either performed or assessed with clinical judgement   ADL Overall ADL's : Needs assistance/impaired     Grooming: Set up;Brushing hair;Sitting Grooming Details (indicate cue type and reason): pt able to perform basic grooming tasks with set up assist                             Functional mobility during ADLs: Minimal assistance;Rolling walker General ADL Comments: Continues to progres, biggest limiting factor is SOB with increased ambulation     Vision       Perception     Praxis      Cognition Arousal/Alertness: Awake/alert Behavior During Therapy: WFL for tasks assessed/performed Overall Cognitive Status: Impaired/Different from baseline Area of Impairment: Safety/judgement;Awareness                         Safety/Judgement: Decreased awareness of deficits;Decreased awareness of safety Awareness: Emergent   General Comments: pt had decreased safety awareness during gait requiring cues for pacing and to be aware of environment        Exercises     Shoulder Instructions       General Comments      Pertinent Vitals/ Pain       Pain Assessment: No/denies pain  Home Living  Prior Functioning/Environment              Frequency  Min 2X/week        Progress Toward Goals  OT Goals(current goals can now be found in the care plan section)  Progress towards OT goals: Progressing toward goals  Acute Rehab OT Goals Patient Stated Goal: return home OT Goal Formulation: With patient Time For Goal Achievement: 10/02/20 Potential to Achieve Goals: Good  Plan Discharge plan remains appropriate    Co-evaluation                 AM-PAC OT "6 Clicks" Daily Activity     Outcome Measure   Help from another person eating meals?: None Help from another person taking care of personal  grooming?: A Little Help from another person toileting, which includes using toliet, bedpan, or urinal?: A Lot Help from another person bathing (including washing, rinsing, drying)?: A Little Help from another person to put on and taking off regular upper body clothing?: A Little Help from another person to put on and taking off regular lower body clothing?: A Lot 6 Click Score: 17    End of Session Equipment Utilized During Treatment: Gait belt;Rolling walker;Oxygen  OT Visit Diagnosis: Unsteadiness on feet (R26.81);Other abnormalities of gait and mobility (R26.89);Muscle weakness (generalized) (M62.81)   Activity Tolerance Patient tolerated treatment well   Patient Left in chair;with call bell/phone within reach;with family/visitor present   Nurse Communication Mobility status        Time: 0347-4259 OT Time Calculation (min): 24 min  Charges: OT General Charges $OT Visit: 1 Visit OT Treatments $Self Care/Home Management : 23-37 mins  [place], [place] [facility] [place] 09/19/2020, 11:46 AM

## 2020-09-20 ENCOUNTER — Other Ambulatory Visit (HOSPITAL_COMMUNITY): Payer: Self-pay | Admitting: Student

## 2020-09-20 DIAGNOSIS — R5381 Other malaise: Secondary | ICD-10-CM

## 2020-09-20 DIAGNOSIS — N400 Enlarged prostate without lower urinary tract symptoms: Secondary | ICD-10-CM

## 2020-09-20 DIAGNOSIS — R531 Weakness: Secondary | ICD-10-CM

## 2020-09-20 LAB — RENAL FUNCTION PANEL
Albumin: 2.7 g/dL — ABNORMAL LOW (ref 3.5–5.0)
Anion gap: 9 (ref 5–15)
BUN: 19 mg/dL (ref 8–23)
CO2: 26 mmol/L (ref 22–32)
Calcium: 8.2 mg/dL — ABNORMAL LOW (ref 8.9–10.3)
Chloride: 98 mmol/L (ref 98–111)
Creatinine, Ser: 0.9 mg/dL (ref 0.61–1.24)
GFR, Estimated: >60 mL/min (ref >60)
Glucose, Bld: 109 mg/dL — ABNORMAL HIGH (ref 70–99)
Phosphorus: 3 mg/dL (ref 2.5–4.6)
Potassium: 4 mmol/L (ref 3.5–5.1)
Sodium: 133 mmol/L — ABNORMAL LOW (ref 135–145)

## 2020-09-20 LAB — MAGNESIUM: Magnesium: 2 mg/dL (ref 1.7–2.4)

## 2020-09-20 MED ORDER — FLUTICASONE PROPIONATE 50 MCG/ACT NA SUSP
1.0000 | Freq: Every day | NASAL | 1 refills | Status: DC
Start: 2020-09-20 — End: 2020-09-20

## 2020-09-20 MED ORDER — SENNOSIDES-DOCUSATE SODIUM 8.6-50 MG PO TABS: 1.0000 | ORAL_TABLET | Freq: Two times a day (BID) | ORAL | Status: AC | PRN

## 2020-09-20 MED ORDER — METOPROLOL SUCCINATE ER 50 MG PO TB24
50.0000 mg | ORAL_TABLET | Freq: Every day | ORAL | 1 refills | Status: DC
Start: 2020-09-20 — End: 2020-10-19

## 2020-09-20 MED ORDER — LORATADINE 10 MG PO TABS: 10.0000 mg | ORAL_TABLET | Freq: Every day | ORAL | 0 refills | Status: AC

## 2020-09-20 MED ORDER — FUROSEMIDE 20 MG PO TABS
20.0000 mg | ORAL_TABLET | Freq: Every day | ORAL | 0 refills | Status: DC
Start: 2020-09-20 — End: 2020-12-22

## 2020-09-20 MED FILL — FLUTICASONE PROP 50 MCG SPR: 50 | 30 days supply | Qty: 16 | Fill #0

## 2020-09-20 MED FILL — FUROSEMIDE 20 MG TAB: 20 | 30 days supply | Qty: 30 | Fill #0

## 2020-09-20 MED FILL — METOPROLOL SUCCINATE ER 50: 50 | 90 days supply | Qty: 90 | Fill #0

## 2020-09-20 NOTE — TOC Transition Note (Signed)
Transition of Care [facility]) - CM/SW Discharge Note   Patient Details  Name: Christopher Hogan MRN: [id number] Date of Birth: Mar 01, 1925  Transition of Care [facility]) CM/SW Contact:  Zenon Mayo, RN Phone Number: 09/20/2020, 12:02 PM   Clinical Narrative:    Patient is for dc today, NCM offered choice for HHPT, [place], she states she does not have a preference.  NCM made referral to [facility] with Alvis Lemmings , he is able to take referral.  Soc will begin 24 to 48 hrs post ds.  Patient will also need home oxygen, wife states [employer] can supply the oxygen for them and he already has a walker at home.    Final next level of care: [place] Barriers to Discharge: No Barriers Identified   Patient Goals and CMS Choice Patient states their goals for this hospitalization and ongoing recovery are:: get better CMS Medicare.gov Compare Post Acute Care list provided to:: Patient Represenative (must comment) Choice offered to / list presented to : Spouse  Discharge Placement                       Discharge Plan and Services                DME Arranged: Oxygen DME Agency: [employer] Date DME Agency Contacted: 09/20/20 Time DME Agency Contacted: 1202 Representative spoke with at DME Agency: Thedore Mins HH Arranged: PT, OT [place] Agency: [place] Date [place]: 09/20/20 Time [place]: 1202 Representative spoke with at [place]: [place] ([place]) Interventions     Readmission Risk Interventions No flowsheet data found.

## 2020-09-20 NOTE — Discharge Summary (Signed)
Physician Discharge Summary  Christopher Hogan WUJ:[id number] DOB: 02/23/1925 DOA: 09/16/2020  PCP: Colon Branch, MD  Admit date: 09/16/2020 Discharge date: 09/20/2020  Admitted From: Home Disposition: Home  Recommendations for Outpatient Follow-up:  1. Follow ups as below. 2. Please obtain CBC/BMP/Mag at follow up 3. Please follow up on the following pending results: None  Home Health: PT/OT Equipment/Devices: Rolling walker and oxygen  Discharge Condition: Stable CODE STATUS: Full code   Follow-up Information    Colon Branch, MD. Schedule an appointment as soon as possible for a visit in 1 week(s).   Specialty: Internal Medicine Contact information: [address] [place] [address] [phone]        [facility] Follow up.   Why: oxygen, rolling walker Contact information: [address] [place] [address] [phone]                Hospital Course: 86 year old male with PMH of CAD, pulmonary fibrosis?, AAA, systolic CHF, HTN and postnasal drainage presenting with progressive dyspnea, fatigue and runny nose.   In ED, vital signs within normal except for elevated respiratory rate to mid and upper 20s. 94% on RA. WBC 8.1. Hgb 12.7. Na 134. BNP 389. Troponin negative x2. D-dimer 3.46. CTA chest negative for PE but multifocal pneumonia concerning for viral etiology. Mildly elevated inflammatory markers including CRP and LDH as well. COVID-19, influenza A and B and RSV negative x2. Empirically started on azithromycin and Zosyn for community-acquired pneumonia and admitted for "hypoxemic respiratory failure".   Procalcitonin negative. Discontinued IV Zosyn. Desaturated to 86% at rest and 82% with ambulation requiring 4 L to recover to 88%. TTE with EF of 35 to 40% and RWMA with hypokinesis of inferolateral wall and basal mid lateral wall. There is no prior echo in his chart of care everywhere to compare to but history  of systolic CHF with EF of 84-13% per last cardiology note in 11/2019. Siscussed TTE with on-call cardiologist, Dr. Radford Pax who recommended outpatient follow-up with his cardiologist, Dr. Meda Coffee. Patient was stareted ontrial of low dose IV Lasix, and eventually switched to oral lasix 20 mg daily. We also stopped amlodipine and switched atenolol to metoprolol XL given systolic CHF. He has upcoming appointment with cardiology in two days.   PCCM consulted about respiratory failure, and recommended continuing current care with azithromycine for atypical coverage. PCCM to arrange outpatient follow up and repeat CT in about 6-8 weeks.  On the day of discharge, he felt well but couldn't be liberated of oxygen. He was discharged on 2L by [facility]. HHPT/OT ordered as recommended by therapy.   See individual problem list below for more  Discharge Diagnoses:  Acute hypoxemic respiratory failure/dyspnea/cough:CTA chest negative for PE but multifocal pneumonia consistent with viral etiology. Could also be atypical pneumonia. However, no leukocytosis, fever or elevated procalcitonin. COVID-19 PCR, influenza and RSV negative x2. Broad RVP panel negative. He has elevated BNP to 380s but no prior to compare to. He does not appear fluid overloaded. TTE as below. Continues to require supplemental oxygen -Discontinued IV Zosyn on 10/30 -Azithromycin and prednisone10/29-11/2 -Home oxygen, 2L by [place] -Incentive spirometry, OOB, PT/OT -PCCM to arrange outpatient follow-up and repeat CT chest to check resolution  Chronic systolic CHF: TTE with EF of 35 to 40% and RWMA with hypokinesis of inferolateral wall and basal mid lateral wall. There is no prior echo in his chart of care everywhere to compare to but history of systolic  CHF with EF of 33-48% per last cardiology note in 11/2019. He sees Dr. Meda Coffee outpatient. Discussed TTEfinding with on-call cardiologist, Dr. Radford Pax who recommended outpatient evaluation. He has  slightly elevated BNP to 380s but appears euvolemic other than crackles which could be due to pneumonia. Not on diuretics at home. He has some orthostasis but orthostatic vitals negative. Good urine output with IV Lasix and PO lasix. BP and renal function relatively stable. -P.o. Lasix 20 mg daily -Changed atenolol to metoprolol XL. -Defer further GDMT to his cardiologist-has upcoming appointment -Check BMP and Mg at follow up  Essential hypertension:Normotensive.  -Continuemetoprolol XL and Lasix -Discontinue atenolol and amlodipine. Prefer GDMT for CHF   Orthostasis-reports postural orthostasis.Ongoing issue for about a month. He is on multiple antihypertensive meds. Some drop in SBP with standing at 0-minute but recovers at 3 minutes.TSH within normal. -Apply TED hose  Elevated D-dimer: CTA chest negative for PE. LE [place] negative for DVT.  CAD/history of inferior wall MI:No ischemia on his stress test in 2017. No chest pain. -Continue home medications  Normocytic anemia: H&H relatively stable.Anemia panel basically normal.  Mild hyponatremia:Stable  Postnasal drainage -Loratadine and Flonase spray  Constipation: resolved -Bowel regimen  GERD -Continue PPI while on steroid  BPH without LUTS -Continue home Flomax   Body mass index is 21.71 kg/m.            Discharge Exam: Vitals:   09/20/20 1026 09/20/20 1029  BP: 127/68 127/68  Pulse: 77 77  Resp:  (!) 24  Temp:  97.8 F (36.6 C)  SpO2:  96%    GENERAL: No apparent distress.  Nontoxic. HEENT: MMM.  Vision and hearing grossly intact.  NECK: Supple.  No apparent JVD.  RESP: 96% on 1 L at rest.  No IWOB.  Fair aeration.  Bilateral crackles CVS:  RRR. Heart sounds normal.  ABD/GI/GU: Bowel sounds present. Soft. Non tender.  MSK/EXT:  Moves extremities. No apparent deformity. No edema.  SKIN: no apparent skin lesion or wound NEURO: Awake, alert and oriented appropriately.  No  apparent focal neuro deficit. PSYCH: Calm. Normal affect.  Discharge Instructions  Discharge Instructions    Call MD for:  difficulty breathing, headache or visual disturbances   Complete by: As directed    Call MD for:  extreme fatigue   Complete by: As directed    Call MD for:  persistant dizziness or light-headedness   Complete by: As directed    Diet - low sodium heart healthy   Complete by: As directed    Discharge instructions   Complete by: As directed    It has been a pleasure taking care of you!  You were hospitalized due to shortness of breath, fatigue and runny nose.  You CT scan showed pneumonia which is consistent with viral pneumonia or atypical pneumonia.  We have treated you for atypical pneumonia.  We have also started you on fluid medication given your heart failure.  There are also other medication changes during this hospitalization.  Please review your new medication list and the directions on your medications before you take them.  Follow-up with your cardiologist as previously scheduled.  The pulmonologist (lung doctors) will arrange outpatient follow-up for further evaluation of your lungs.  Also follow-up with your primary care doctor in 1 to 2 weeks.   Take care,   Increase activity slowly   Complete by: As directed      Allergies as of 09/20/2020      Reactions  Ace Inhibitors Cough   Aspirin Nausea Only, Other (See Comments)   Full strength only   Spironolactone Rash      Medication List    STOP taking these medications   amLODipine 2.5 MG tablet Commonly known as: NORVASC   atenolol 50 MG tablet Commonly known as: TENORMIN   meclizine 25 MG tablet Commonly known as: Antivert   multivitamin capsule     TAKE these medications   acetaminophen 500 MG tablet Commonly known as: TYLENOL Take 1,000 mg by mouth every 6 (six) hours as needed for mild pain.   albuterol 108 (90 Base) MCG/ACT inhaler Commonly known as: VENTOLIN HFA Inhale 2  puffs into the lungs every 6 (six) hours as needed for wheezing or shortness of breath.   aspirin 81 MG chewable tablet Chew 81 mg by mouth daily.   azelastine 0.1 % nasal spray Commonly known as: ASTELIN Place 2 sprays into both nostrils 2 (two) times daily. Use in each nostril as directed   betamethasone valerate 0.1 % cream Commonly known as: VALISONE Apply 1 application topically daily.   dorzolamidel-timolol 22.3-6.8 MG/ML Soln ophthalmic solution Commonly known as: COSOPT Place 1 drop into both eyes 2 (two) times daily.   fenofibrate 48 MG tablet Commonly known as: TRICOR Take 48 mg by mouth daily.   fluticasone 50 MCG/ACT nasal spray Commonly known as: FLONASE Place 1 spray into both nostrils daily.   furosemide 20 MG tablet Commonly known as: LASIX Take 1 tablet (20 mg total) by mouth daily.   HYDROcodone-acetaminophen 5-325 MG tablet Commonly known as: Norco 0.5-1 tablet po bid prn pain What changed:   how much to take  how to take this  when to take this  reasons to take this   ICAPS AREDS FORMULA PO Take 1 capsule by mouth in the morning and at bedtime.   ipratropium 0.06 % nasal spray Commonly known as: ATROVENT Place 2 sprays into both nostrils 4 (four) times daily.   isosorbide mononitrate 30 MG 24 hr tablet Commonly known as: IMDUR Take 0.5 tablets (15 mg total) by mouth daily.   latanoprost 0.005 % ophthalmic solution Commonly known as: XALATAN Place 1 drop into the right eye at bedtime.   loratadine 10 MG tablet Commonly known as: CLARITIN Take 1 tablet (10 mg total) by mouth daily.   MELATONIN PO Take 3 mg by mouth at bedtime as needed (sleep).   metoprolol succinate 50 MG 24 hr tablet Commonly known as: TOPROL-XL Take 1 tablet (50 mg total) by mouth daily. Take with or immediately following a meal.   nitroGLYCERIN 0.4 MG SL tablet Commonly known as: NITROSTAT Place 1 tablet (0.4 mg total) under the tongue every 5 (five) minutes  as needed for chest pain.   pantoprazole 40 MG tablet Commonly known as: PROTONIX Take 1 tablet (40 mg total) by mouth daily.   senna-docusate 8.6-50 MG tablet Commonly known as: Senokot-S Take 1 tablet by mouth 2 (two) times daily as needed for moderate constipation.   simvastatin 10 MG tablet Commonly known as: ZOCOR Take 1 tablet (10 mg total) by mouth at bedtime.   tamsulosin 0.4 MG Caps capsule Commonly known as: FLOMAX Take 1 capsule (0.4 mg total) by mouth daily after supper.            Durable Medical Equipment  (From admission, onward)         Start     Ordered   09/20/20 1250  For home use only DME  oxygen  Once       Question Answer Comment  Length of Need 6 Months   Mode or (Route) Nasal cannula   Liters per Minute 2   Frequency Continuous (stationary and portable oxygen unit needed)   Oxygen delivery system Gas      09/20/20 1249   09/20/20 0830  DME Walker  Once       Question Answer Comment  Walker: With [place]   Patient needs a walker to treat with the following condition Generalized weakness      09/20/20 0830          Consultations:  Cardiology over the phone  Pulmonology  Procedures/Studies:  2D Echo on 09/17/2020 1. Left ventricular ejection fraction, by estimation, is 35 to 40%. The  left ventricle has moderately decreased function. The left ventricle  demonstrates regional wall motion abnormalities (see scoring  diagram/findings for description). Left ventricular  diastolic parameters are indeterminate. There is hypokinesis of the left  ventricular, entire inferolateral wall. There is hypokinesis of the left  ventricular, basal-mid lateral wall.  2. Right ventricular systolic function is normal. The right ventricular  size is normal.  3. The mitral valve is normal in structure. Trivial mitral valve  regurgitation. No evidence of mitral stenosis.  4. The aortic valve has an indeterminant number of cusps. Aortic valve   regurgitation is mild. Mild to moderate aortic valve  sclerosis/calcification is present, without any evidence of aortic  stenosis.  5. Aortic dilatation noted. There is mild dilatation of the aortic root,  measuring 40 mm. There is mild dilatation of the ascending aorta,  measuring 37 mm.  6. The inferior vena cava is normal in size with greater than 50%  respiratory variability, suggesting right atrial pressure of 3 mmHg.  7. Recommend limited echo with definity for better assessmen of wall  motion as anterior wall not well visualized in apical and parasternal  short axis views.   CT Angio Chest PE W and/or Wo Contrast  Result Date: 09/16/2020 CLINICAL DATA:  86 year old male with concern for pulmonary embolism. EXAM: CT ANGIOGRAPHY CHEST WITH CONTRAST TECHNIQUE: Multidetector CT imaging of the chest was performed using the standard protocol during bolus administration of intravenous contrast. Multiplanar CT image reconstructions and MIPs were obtained to evaluate the vascular anatomy. CONTRAST:  46mL OMNIPAQUE IOHEXOL 350 MG/ML SOLN COMPARISON:  Chest radiograph dated 09/16/2020. FINDINGS: Evaluation of this exam is limited due to respiratory motion artifact. Cardiovascular: There is mild cardiomegaly. No pericardial effusion. There is coronary vascular calcification of the LAD. Moderate atherosclerotic calcification of the thoracic aorta. Evaluation of the pulmonary arteries is limited due to respiratory motion artifact and suboptimal visualization of the peripheral branches. No large or central pulmonary artery embolus identified. Mediastinum/Nodes: There is no hilar or mediastinal adenopathy. The esophagus is grossly unremarkable. No mediastinal fluid collection. Lungs/Pleura: There is diffuse patchy ground-glass opacities throughout the lungs most consistent with multifocal pneumonia, likely viral or atypical in etiology including COVID-19. Clinical correlation and follow-up recommended.  There is mild bronchiectatic changes. No pleural effusion or pneumothorax. The central airways are patent. Upper Abdomen: Cholecystectomy. Musculoskeletal: No chest wall abnormality. No acute or significant osseous findings. Review of the MIP images confirms the above findings. IMPRESSION: 1. No CT evidence of central pulmonary artery embolus. 2. Multifocal pneumonia, likely viral or atypical in etiology. Clinical correlation and follow-up recommended. 3. Aortic Atherosclerosis (ICD10-I70.0). Electronically Signed   By: Anner Crete M.D.   On: 09/16/2020 19:18  DG Chest Portable 1 View  Result Date: 09/16/2020 CLINICAL DATA:  Cough EXAM: PORTABLE CHEST 1 VIEW COMPARISON:  Apr 14, 2019 FINDINGS: There is fibrotic type change in the lung bases and upper lung regions. There is no edema or airspace opacity. Heart is mildly enlarged with pulmonary vascularity normal. There is aortic atherosclerosis. No adenopathy. There is degenerative change in each shoulder. There is superior migration of the humeral heads. IMPRESSION: Fibrotic type change in the bases upper lung regions. No edema or consolidation evident. Stable cardiac prominence. No adenopathy. Aortic Atherosclerosis (ICD10-I70.0). Arthropathy in each shoulder with superior migration of each humeral head, a finding likely indicative of chronic rotator cuff tears. Electronically Signed   By: Lowella Grip III M.D.   On: 09/16/2020 12:31   DG Swallowing Func-Speech Pathology  Result Date: 09/19/2020 Study completed by Greggory Keen, SLP student Supervised and reviewed by Herbie Baltimore MA CCC-SLP Objective Swallowing Evaluation: Type of Study: MBS-Modified Barium Swallow Study  Patient Details Name: Christopher Hogan MRN: [id number] Date of Birth: February 22, 1925 Today's Date: 09/19/2020 Time: SLP Start Time (ACUTE ONLY): 3532 -SLP Stop Time (ACUTE ONLY): 1310 SLP Time Calculation (min) (ACUTE ONLY): 15 min Past Medical History: Past Medical History: Diagnosis  Date . AAA (abdominal aortic aneurysm) ([place])   repaired 02/2002, renal ultrasound, Sept 2010: normal abdominal aorta, normal renal arteries  . Age-related macular degeneration, wet, left eye ([place])  . Allergic rhinitis  . Arthritis  . At risk for pulmonary fibrosis 06/25/2018 . CAD (coronary artery disease)   stable stress test 7/11 . CHF (congestive heart failure) ([place])  . Chronic systolic CHF (congestive heart failure) ([place]) 12/17/2013 . Coronary atherosclerosis 06/09/2007  H/O inferior wall MI- PTCA- 1989 Last stress test 2/17- inferior scar, no ischemia   . DEGENERATIVE JOINT DISEASE 03/17/2010  Qualifier: Diagnosis of  By: Dawson Bills   . Dyslipidemia 05/06/2007  LDL 59 Jan 2019    . Eczema 07/09/2013 . ED (erectile dysfunction)  . Epidermoid cyst of skin of chest 09/29/2018 . ERECTILE DYSFUNCTION 12/09/2007  Qualifier: Diagnosis of  By: Larose Kells MD, [place] hypertension 04/30/2007  Qualifier: Diagnosis of  By: Larose Kells MD, [place] GERD 07/21/2008  Qualifier: Diagnosis of  By: Larose Kells MD, [place] GERD (gastroesophageal reflux disease)  . Glaucoma and macular degeneration 12/31/2011 . Hyperlipidemia  . Hypertension  . Inguinal hernia 12/31/2011 . Insomnia 02/22/2018 . Ischemic cardiomyopathy 06/23/2018  Last EF 30-44% by [facility] Feb 2017 . Left inguinal hernia 06/30/2018 . Myocardial infarction ([place]) 1989  interior wall infarction . PCP notes >>>>>>>>>>>>>>>>>>>> 07/17/2016 . Pneumonia 2019 . Pre-operative cardiovascular examination 12/31/2011 . Rhinitis 04/20/2008  Qualifier: Diagnosis of  By: Larose Kells MD, [place] Vertigo 06/20/2015 Past Surgical History: Past Surgical History: Procedure Laterality Date . AAA repair  2003 . CARDIAC CATHETERIZATION   . CHOLECYSTECTOMY   . INGUINAL HERNIA REPAIR Right 1985 . INGUINAL HERNIA REPAIR Left 06/30/2018  open; w/mesh . INGUINAL HERNIA REPAIR Left 06/30/2018  Procedure: OPEN LEFT INGUINAL HERNIA REPAIR;  Surgeon: Fanny Skates, MD;  Location: [facility];  Service: General;  Laterality: Left; .  INSERTION OF MESH Left 06/30/2018  Procedure: INSERTION OF MESH;  Surgeon: Fanny Skates, MD;  Location: [facility];  Service: General;  Laterality: Left; [place] MASS EXCISION N/A 09/29/2018  Procedure: EXCISION OF 3 CM MASS ANTERIOR CHEST WALL;  Surgeon: Fanny Skates, MD;  Location: [facility];  Service: General;  Laterality: N/A; . PTCA  1989, 3235 HPI: 86 year old male with PMH of CAD, pulmonary fibrosis, AAA, systolic CHF, HTN and postnasal drainage presenting with progressive dyspnea, fatigue and runny nose. CTA chest negative for PE but multifocal pneumonia concerning for viral etiology. Pt has history of dysphagia and MBS 6/21: mild delay in swallow initiation resulting in one instance of silent aspiration before the swallow with thin liquids. Pt also found to have Zenker's diverticulum that did not impact swallow function. Recommended regular/thin with chin tuck.  No data recorded Assessment / Plan / Recommendation CHL IP CLINICAL IMPRESSIONS 09/19/2020 Clinical Impression Pt was seen for MBS which revealed a mild oropharyngeal dysphagia. Although the oral phase of swallowing was largely functional, premature spillage to the level of the valleculae was observed across POs. Given consecutive sips of thin liquid, premature spillage fell to the level of the pyriforms, and an instance of frank flash penetration occurred prior to the swallow. The pharyngeal swallow was otherwise adequate. Previously noted Zenker's divertuclum was observed during the study, but did not impact swallow function. SLP educated pt on the use of small, single sips and bites. Recommend regular diet and thin liquids. No further SLP treatment is warranted at this time. SLP Visit Diagnosis Dysphagia, oropharyngeal phase (R13.12) Attention and concentration deficit following -- Frontal lobe and executive function deficit following -- Impact on safety and function Mild aspiration risk   CHL IP TREATMENT RECOMMENDATION 09/19/2020  Treatment Recommendations No treatment recommended at this time   Prognosis 09/19/2020 Prognosis for Safe Diet Advancement Fair Barriers to Reach Goals -- Barriers/Prognosis Comment -- CHL IP DIET RECOMMENDATION 09/19/2020 SLP Diet Recommendations Regular solids;Thin liquid Liquid Administration via Straw;Cup Medication Administration Whole meds with liquid Compensations Small sips/bites Postural Changes --   CHL IP OTHER RECOMMENDATIONS 09/19/2020 Recommended Consults -- Oral Care Recommendations Oral care BID Other Recommendations --   CHL IP FOLLOW UP RECOMMENDATIONS 05/13/2020 Follow up Recommendations Outpatient SLP   CHL IP FREQUENCY AND DURATION 09/19/2020 Speech Therapy Frequency (ACUTE ONLY) min 2x/week Treatment Duration 2 weeks      CHL IP ORAL PHASE 09/19/2020 Oral Phase Impaired Oral - Pudding Teaspoon -- Oral - Pudding Cup -- Oral - Honey Teaspoon -- Oral - Honey Cup -- Oral - Nectar Teaspoon -- Oral - Nectar Cup -- Oral - Nectar Straw Premature spillage Oral - Thin Teaspoon -- Oral - Thin Cup -- Oral - Thin Straw Premature spillage Oral - Puree Premature spillage Oral - Mech Soft -- Oral - Regular Premature spillage Oral - Multi-Consistency -- Oral - Pill -- Oral Phase - Comment --  CHL IP PHARYNGEAL PHASE 09/19/2020 Pharyngeal Phase Impaired Pharyngeal- Pudding Teaspoon -- Pharyngeal -- Pharyngeal- Pudding Cup -- Pharyngeal -- Pharyngeal- Honey Teaspoon -- Pharyngeal -- Pharyngeal- Honey Cup -- Pharyngeal -- Pharyngeal- Nectar Teaspoon -- Pharyngeal -- Pharyngeal- Nectar Cup -- Pharyngeal -- Pharyngeal- Nectar Straw WFL Pharyngeal -- Pharyngeal- Thin Teaspoon -- Pharyngeal -- Pharyngeal- Thin Cup -- Pharyngeal -- Pharyngeal- Thin Straw Trace aspiration;Penetration/Aspiration before swallow Pharyngeal Material enters airway, CONTACTS cords and then ejected out Pharyngeal- Puree WFL Pharyngeal -- Pharyngeal- Mechanical Soft -- Pharyngeal -- Pharyngeal- Regular WFL Pharyngeal -- Pharyngeal- Multi-consistency  -- Pharyngeal -- Pharyngeal- Pill -- Pharyngeal -- Pharyngeal Comment --  No flowsheet data found. Herbie Baltimore, MA CCC-SLP Acute Rehabilitation Services Pager [phone] Office [phone] Lynann Beaver 09/19/2020, 1:32 PM              ECHOCARDIOGRAM COMPLETE  Result Date: 09/17/2020    ECHOCARDIOGRAM REPORT   Patient Name:  Christopher Hogan Date of Exam: 09/17/2020 Medical Rec #:  [id number]        Height:       67.0 in Accession #:    [id number]       Weight:       135.8 lb Date of Birth:  [birth year]        BSA:          1.715 m Patient Age:    69 years         BP:           111/62 mmHg Patient Gender: M                HR:           64 bpm. Exam Location:  Inpatient Procedure: 2D Echo, Cardiac Doppler and Color Doppler Indications:    CHF-Acute Diastolic  History:        Patient has no prior history of Echocardiogram examinations.                 CAD, Signs/Symptoms:Shortness of Breath; Risk                 Factors:Hypertension and Dyslipidemia. AAA.  Sonographer:    Clayton Lefort RDCS (AE) Referring Phys: [phone] EKTA V PATEL  Sonographer Comments: No subcostal window. IMPRESSIONS  1. Left ventricular ejection fraction, by estimation, is 35 to 40%. The left ventricle has moderately decreased function. The left ventricle demonstrates regional wall motion abnormalities (see scoring diagram/findings for description). Left ventricular  diastolic parameters are indeterminate. There is hypokinesis of the left ventricular, entire inferolateral wall. There is hypokinesis of the left ventricular, basal-mid lateral wall.  2. Right ventricular systolic function is normal. The right ventricular size is normal.  3. The mitral valve is normal in structure. Trivial mitral valve regurgitation. No evidence of mitral stenosis.  4. The aortic valve has an indeterminant number of cusps. Aortic valve regurgitation is mild. Mild to moderate aortic valve sclerosis/calcification is present, without any evidence of  aortic stenosis.  5. Aortic dilatation noted. There is mild dilatation of the aortic root, measuring 40 mm. There is mild dilatation of the ascending aorta, measuring 37 mm.  6. The inferior vena cava is normal in size with greater than 50% respiratory variability, suggesting right atrial pressure of 3 mmHg.  7. Recommend limited echo with definity for better assessmen of wall motion as anterior wall not well visualized in apical and parasternal short axis views. FINDINGS  Left Ventricle: Left ventricular ejection fraction, by estimation, is 35 to 40%. The left ventricle has moderately decreased function. The left ventricle demonstrates regional wall motion abnormalities. The left ventricular internal cavity size was normal in size. There is no left ventricular hypertrophy. Abnormal (paradoxical) septal motion, consistent with left bundle branch block. Left ventricular diastolic parameters are indeterminate. Right Ventricle: The right ventricular size is normal. No increase in right ventricular wall thickness. Right ventricular systolic function is normal. Left Atrium: Left atrial size was normal in size. Right Atrium: Right atrial size was normal in size. Pericardium: There is no evidence of pericardial effusion. Mitral Valve: The mitral valve is normal in structure. Trivial mitral valve regurgitation. No evidence of mitral valve stenosis. Tricuspid Valve: The tricuspid valve is normal in structure. Tricuspid valve regurgitation is not demonstrated. No evidence of tricuspid stenosis. Aortic Valve: The aortic valve has an indeterminant number of cusps. Aortic valve regurgitation is mild. Aortic regurgitation PHT measures 795 msec. Mild to moderate  aortic valve sclerosis/calcification is present, without any evidence of aortic stenosis. Aortic valve mean gradient measures 4.0 mmHg. Aortic valve peak gradient measures 6.2 mmHg. Aortic valve area, by VTI measures 1.99 cm. Pulmonic Valve: The pulmonic valve was normal  in structure. Pulmonic valve regurgitation is trivial. No evidence of pulmonic stenosis. Aorta: Aortic dilatation noted. There is mild dilatation of the aortic root, measuring 40 mm. There is mild dilatation of the ascending aorta, measuring 37 mm. Venous: The inferior vena cava is normal in size with greater than 50% respiratory variability, suggesting right atrial pressure of 3 mmHg. IAS/Shunts: No atrial level shunt detected by color flow Doppler.  LEFT VENTRICLE PLAX 2D LVIDd:         5.40 cm LVIDs:         4.90 cm LV PW:         0.80 cm LV IVS:        1.40 cm LVOT diam:     2.20 cm LV SV:         50 LV SV Index:   29 LVOT Area:     3.80 cm  LV Volumes (MOD) LV vol d, MOD A2C: 84.4 ml LV vol d, MOD A4C: 114.0 ml LV vol s, MOD A2C: 57.4 ml LV vol s, MOD A4C: 75.0 ml LV SV MOD A2C:     27.0 ml LV SV MOD A4C:     114.0 ml LV SV MOD BP:      32.9 ml RIGHT VENTRICLE RV S prime:     7.62 cm/s TAPSE (M-mode): 1.2 cm LEFT ATRIUM             Index       RIGHT ATRIUM          Index LA diam:        4.20 cm 2.45 cm/m  RA Area:     8.51 cm LA Vol (A2C):   30.3 ml 17.66 ml/m RA Volume:   13.70 ml 7.99 ml/m LA Vol (A4C):   48.8 ml 28.45 ml/m LA Biplane Vol: 40.4 ml 23.55 ml/m  AORTIC VALVE AV Area (Vmax):    2.00 cm AV Area (Vmean):   1.76 cm AV Area (VTI):     1.99 cm AV Vmax:           124.00 cm/s AV Vmean:          89.400 cm/s AV VTI:            0.250 m AV Peak Grad:      6.2 mmHg AV Mean Grad:      4.0 mmHg LVOT Vmax:         65.40 cm/s LVOT Vmean:        41.400 cm/s LVOT VTI:          0.131 m LVOT/AV VTI ratio: 0.52 AI PHT:            795 msec  AORTA Ao Root diam: 4.00 cm Ao Asc diam:  3.70 cm  SHUNTS Systemic VTI:  0.13 m Systemic Diam: 2.20 cm Fransico Him MD Electronically signed by Fransico Him MD Signature Date/Time: 09/17/2020/12:24:13 PM    Final    VAS [place] LOWER EXTREMITY VENOUS (DVT)  Result Date: 09/17/2020  Lower Venous DVT Study Indications: SOB, and elevated D-dimer.  Comparison Study: No prior  study Performing Technologist: Sharion Dove RVS  Examination Guidelines: A complete evaluation includes B-mode imaging, spectral Doppler, color Doppler, and power Doppler as needed of all  accessible portions of each vessel. Bilateral testing is considered an integral part of a complete examination. Limited examinations for reoccurring indications may be performed as noted. The reflux portion of the exam is performed with the patient in reverse Trendelenburg.  +---------+---------------+---------+-----------+----------+--------------+ RIGHT    CompressibilityPhasicitySpontaneityPropertiesThrombus Aging +---------+---------------+---------+-----------+----------+--------------+ CFV      Full           Yes      Yes                                 +---------+---------------+---------+-----------+----------+--------------+ SFJ      Full                                                        +---------+---------------+---------+-----------+----------+--------------+ FV Prox  Full                                                        +---------+---------------+---------+-----------+----------+--------------+ FV Mid   Full                                                        +---------+---------------+---------+-----------+----------+--------------+ FV DistalFull                                                        +---------+---------------+---------+-----------+----------+--------------+ PFV      Full                                                        +---------+---------------+---------+-----------+----------+--------------+ POP      Full           Yes      Yes                                 +---------+---------------+---------+-----------+----------+--------------+ PTV      Full                                                        +---------+---------------+---------+-----------+----------+--------------+ PERO     Full                                                         +---------+---------------+---------+-----------+----------+--------------+   +---------+---------------+---------+-----------+----------+--------------+ LEFT  CompressibilityPhasicitySpontaneityPropertiesThrombus Aging +---------+---------------+---------+-----------+----------+--------------+ CFV      Full           Yes      Yes                                 +---------+---------------+---------+-----------+----------+--------------+ SFJ      Full                                                        +---------+---------------+---------+-----------+----------+--------------+ FV Prox  Full                                                        +---------+---------------+---------+-----------+----------+--------------+ FV Mid   Full                                                        +---------+---------------+---------+-----------+----------+--------------+ FV DistalFull                                                        +---------+---------------+---------+-----------+----------+--------------+ PFV      Full                                                        +---------+---------------+---------+-----------+----------+--------------+ POP      Full           Yes      Yes                                 +---------+---------------+---------+-----------+----------+--------------+ PTV      Full                                                        +---------+---------------+---------+-----------+----------+--------------+ PERO     Full                                                        +---------+---------------+---------+-----------+----------+--------------+     Summary: BILATERAL: - No evidence of deep vein thrombosis seen in the lower extremities, bilaterally. -   *See table(s) above for measurements and observations. Electronically signed by Deitra Mayo MD on 09/17/2020 at 1:50:27  PM.    Final  The results of significant diagnostics from this hospitalization (including imaging, microbiology, ancillary and laboratory) are listed below for reference.     Microbiology: Recent Results (from the past 240 hour(s))  Respiratory Panel by RT PCR (Flu A&B, Covid) - Nasopharyngeal Swab     Status: None   Collection Time: 09/16/20 12:16 PM   Specimen: Nasopharyngeal Swab  Result Value Ref Range Status   SARS Coronavirus 2 by RT PCR NEGATIVE NEGATIVE Final    Comment: (NOTE) SARS-CoV-2 target nucleic acids are NOT DETECTED.  The SARS-CoV-2 RNA is generally detectable in upper respiratoy specimens during the acute phase of infection. The lowest concentration of SARS-CoV-2 viral copies this assay can detect is 131 copies/mL. A negative result does not preclude SARS-Cov-2 infection and should not be used as the sole basis for treatment or other patient management decisions. A negative result may occur with  improper specimen collection/handling, submission of specimen other than nasopharyngeal swab, presence of viral mutation(s) within the areas targeted by this assay, and inadequate number of viral copies (<131 copies/mL). A negative result must be combined with clinical observations, patient history, and epidemiological information. The expected result is Negative.  Fact Sheet for Patients:  PinkCheek.be  Fact Sheet for Healthcare Providers:  GravelBags.it  This test is no t yet approved or cleared by the [place] FDA and  has been authorized for detection and/or diagnosis of SARS-CoV-2 by FDA under an Emergency Use Authorization (EUA). This EUA will remain  in effect (meaning this test can be used) for the duration of the COVID-19 declaration under Section 564(b)(1) of the Act, 21 U.S.C. section 360bbb-3(b)(1), unless the authorization is terminated or revoked sooner.     Influenza A by PCR  NEGATIVE NEGATIVE Final   Influenza B by PCR NEGATIVE NEGATIVE Final    Comment: (NOTE) The Xpert Xpress SARS-CoV-2/FLU/RSV assay is intended as an aid in  the diagnosis of influenza from Nasopharyngeal swab specimens and  should not be used as a sole basis for treatment. Nasal washings and  aspirates are unacceptable for Xpert Xpress SARS-CoV-2/FLU/RSV  testing.  Fact Sheet for Patients: PinkCheek.be  Fact Sheet for Healthcare Providers: GravelBags.it  This test is not yet approved or cleared by the [place] FDA and  has been authorized for detection and/or diagnosis of SARS-CoV-2 by  FDA under an Emergency Use Authorization (EUA). This EUA will remain  in effect (meaning this test can be used) for the duration of the  Covid-19 declaration under Section 564(b)(1) of the Act, 21  U.S.C. section 360bbb-3(b)(1), unless the authorization is  terminated or revoked. Performed at [facility], [place] [address] [place], [place] [address]   Respiratory Panel by RT PCR (Flu A&B, Covid) - Nasopharyngeal Swab     Status: None   Collection Time: 09/16/20  7:44 PM   Specimen: Nasopharyngeal Swab  Result Value Ref Range Status   SARS Coronavirus 2 by RT PCR NEGATIVE NEGATIVE Final    Comment: (NOTE) SARS-CoV-2 target nucleic acids are NOT DETECTED.  The SARS-CoV-2 RNA is generally detectable in upper respiratoy specimens during the acute phase of infection. The lowest concentration of SARS-CoV-2 viral copies this assay can detect is 131 copies/mL. A negative result does not preclude SARS-Cov-2 infection and should not be used as the sole basis for treatment or other patient management decisions. A negative result may occur with  improper specimen collection/handling, submission of specimen other than nasopharyngeal swab, presence of viral mutation(s) within the areas targeted by  this assay, and inadequate number of viral  copies (<131 copies/mL). A negative result must be combined with clinical observations, patient history, and epidemiological information. The expected result is Negative.  Fact Sheet for Patients:  PinkCheek.be  Fact Sheet for Healthcare Providers:  GravelBags.it  This test is no t yet approved or cleared by the [place] FDA and  has been authorized for detection and/or diagnosis of SARS-CoV-2 by FDA under an Emergency Use Authorization (EUA). This EUA will remain  in effect (meaning this test can be used) for the duration of the COVID-19 declaration under Section 564(b)(1) of the Act, 21 U.S.C. section 360bbb-3(b)(1), unless the authorization is terminated or revoked sooner.     Influenza A by PCR NEGATIVE NEGATIVE Final   Influenza B by PCR NEGATIVE NEGATIVE Final    Comment: (NOTE) The Xpert Xpress SARS-CoV-2/FLU/RSV assay is intended as an aid in  the diagnosis of influenza from Nasopharyngeal swab specimens and  should not be used as a sole basis for treatment. Nasal washings and  aspirates are unacceptable for Xpert Xpress SARS-CoV-2/FLU/RSV  testing.  Fact Sheet for Patients: PinkCheek.be  Fact Sheet for Healthcare Providers: GravelBags.it  This test is not yet approved or cleared by the [place] FDA and  has been authorized for detection and/or diagnosis of SARS-CoV-2 by  FDA under an Emergency Use Authorization (EUA). This EUA will remain  in effect (meaning this test can be used) for the duration of the  Covid-19 declaration under Section 564(b)(1) of the Act, 21  U.S.C. section 360bbb-3(b)(1), unless the authorization is  terminated or revoked. Performed at [facility], [place] [address] [place], [place] [address]   Respiratory Panel by PCR     Status: None   Collection Time: 09/17/20 12:45 PM   Specimen: Nasopharyngeal Swab;  Respiratory  Result Value Ref Range Status   Adenovirus NOT DETECTED NOT DETECTED Final   Coronavirus 229E NOT DETECTED NOT DETECTED Final    Comment: (NOTE) The Coronavirus on the Respiratory Panel, DOES NOT test for the novel  Coronavirus (2019 nCoV)    Coronavirus HKU1 NOT DETECTED NOT DETECTED Final   Coronavirus NL63 NOT DETECTED NOT DETECTED Final   Coronavirus OC43 NOT DETECTED NOT DETECTED Final   Metapneumovirus NOT DETECTED NOT DETECTED Final   Rhinovirus / Enterovirus NOT DETECTED NOT DETECTED Final   Influenza A NOT DETECTED NOT DETECTED Final   Influenza B NOT DETECTED NOT DETECTED Final   Parainfluenza Virus 1 NOT DETECTED NOT DETECTED Final   Parainfluenza Virus 2 NOT DETECTED NOT DETECTED Final   Parainfluenza Virus 3 NOT DETECTED NOT DETECTED Final   Parainfluenza Virus 4 NOT DETECTED NOT DETECTED Final   Respiratory Syncytial Virus NOT DETECTED NOT DETECTED Final   Bordetella pertussis NOT DETECTED NOT DETECTED Final   Chlamydophila pneumoniae NOT DETECTED NOT DETECTED Final   Mycoplasma pneumoniae NOT DETECTED NOT DETECTED Final    Comment: Performed at [facility], [place]. [address] [place], [place] [address]     Labs: BNP (last 3 results) Recent Labs    09/16/20 1216  BNP 546.5*   Basic Metabolic Panel: Recent Labs  Lab 09/16/20 1216 09/16/20 1545 09/17/20 0310 09/18/20 0109 09/19/20 0512 09/20/20 0114  NA 134*  --  135 132* 137 133*  K 4.1  --  4.9 4.0 4.1 4.0  CL 100  --  101 100 102 98  CO2 24  --  24 23 23 26   GLUCOSE 121*  --  150* 128* 142* 109*  BUN 11  --  12 17 17 19   CREATININE 0.96  --  1.00 0.87 0.93 0.90  CALCIUM 8.8*  --  8.9 8.6* 8.5* 8.2*  MG  --  1.9 1.9 2.0 2.0 2.0  PHOS  --  3.3 3.7 2.9 3.4 3.0   Liver Function Tests: Recent Labs  Lab 09/16/20 1216 09/17/20 0310 09/18/20 0109 09/19/20 0512 09/20/20 0114  AST 38 39  --   --   --   ALT 34 39  --   --   --   ALKPHOS 59 62  --   --   --   BILITOT 1.1 1.2  --   --    --   PROT 6.2* 6.0*  --   --   --   ALBUMIN 3.3* 3.2* 2.8* 3.0* 2.7*   No results for input(s): LIPASE, AMYLASE in the last 168 hours. No results for input(s): AMMONIA in the last 168 hours. CBC: Recent Labs  Lab 09/16/20 1216 09/17/20 0310 09/18/20 0109 09/19/20 0512  WBC 8.1 7.2 8.2 8.4  NEUTROABS 6.2 6.5  --   --   HGB 12.7* 12.5* 10.7* 11.9*  HCT 39.7 37.6* 32.7* 36.4*  MCV 97.5 95.7 94.2 96.0  PLT 264 228 249 238   Cardiac Enzymes: Recent Labs  Lab 09/17/20 0310  CKTOTAL 58   BNP: Invalid input(s): POCBNP CBG: No results for input(s): GLUCAP in the last 168 hours. D-Dimer No results for input(s): DDIMER in the last 72 hours. Hgb A1c No results for input(s): HGBA1C in the last 72 hours. Lipid Profile No results for input(s): CHOL, HDL, LDLCALC, TRIG, CHOLHDL, LDLDIRECT in the last 72 hours. Thyroid function studies Recent Labs    09/18/20 1318  TSH 1.535   Anemia work up Recent Labs    09/18/20 1318  VITAMINB12 1,141*  FOLATE 20.5  FERRITIN 246  TIBC 231*  IRON 84  RETICCTPCT 3.0   Urinalysis    Component Value Date/Time   COLORURINE YELLOW 09/16/2020 1207   APPEARANCEUR CLOUDY (A) 09/16/2020 1207   LABSPEC 1.016 09/16/2020 1207   PHURINE 8.0 09/16/2020 [place] 09/16/2020 1207   [place] 09/16/2020 1207   [place] 09/16/2020 [place] 09/16/2020 [place] 09/16/2020 1207   NITRITE NEGATIVE 09/16/2020 [place] 09/16/2020 1207   Sepsis Labs Invalid input(s): PROCALCITONIN,  WBC,  LACTICIDVEN   Time coordinating discharge: 40 minutes  SIGNED:  Mercy Riding, MD  [facility] 09/20/2020, 5:07 PM  If 7PM-7AM, please contact night-coverage [email or web address]

## 2020-09-20 NOTE — Progress Notes (Signed)
SATURATION QUALIFICATIONS: (This note is used to comply with regulatory documentation for home oxygen)  Patient Saturations on Room Air at Rest = 88%  Patient Saturations on Room Air while Ambulating = 86%  Patient Saturations on 2 Liters of oxygen while Ambulating = 90%  Please briefly explain why patient needs home oxygen: Pt needs supplemental 02 to maintain Sp02 >90% during activity.    Zettie Cooley, DPT Acute Rehabilitation Services Pager [phone] Office [phone]

## 2020-09-20 NOTE — Care Management Important Message (Signed)
Important Message  Patient Details  Name: Christopher Hogan MRN: [id number] Date of Birth: [birth year]   Medicare Important Message Given:  Yes     Orbie Pyo 09/20/2020, 2:18 PM

## 2020-09-20 NOTE — Progress Notes (Signed)
Physical Therapy Treatment Patient Details Name: Christopher Hogan MRN: [id number] DOB: 1925/08/01 Today's Date: 09/20/2020    History of Present Illness 86 y/o M presenting to ED with c/o SOB, runny nose, and worsening fatigue with suspect of being Covid positive (even though Covid test was negative). PMH includes AAA, CAD, and pulmonary fibrosis, CHF, HTN    PT Comments    Patient progressing well towards PT goals. Practiced using rollator for ambulation today with Min guard assist for safety and cues how to unlock brakes. Noted to have 2-3/4 DOE. Sp02 dropped to 87% on RA, donned 2L and able to maintain >90% with activity. Education re: energy conservation techniques and pursed lip breathing. Pt asking appropriate questions relating 02 needs. Will need supplemental 02 for home. Plans to d/c home today with HHPT. Will follow.   Follow Up Recommendations  Home health PT;Supervision for mobility/OOB     Equipment Recommendations  None recommended by PT    Recommendations for Other Services       Precautions / Restrictions Precautions Precautions: Fall;Other (comment) Precaution Comments: watch 02 Restrictions Weight Bearing Restrictions: No    Mobility  Bed Mobility               General bed mobility comments: up in chair upon arrival  Transfers Overall transfer level: Needs assistance Equipment used: Rolling walker (2 wheeled) Transfers: Sit to/from Stand Sit to Stand: Min guard         General transfer comment: Min guard for safety. Stood from Youth worker. good demo of hand placement.  Ambulation/Gait Ambulation/Gait assistance: Min guard Gait Distance (Feet): 200 Feet Assistive device: Rolling walker (2 wheeled) Gait Pattern/deviations: Step-through pattern;Decreased stride length;Trunk flexed;Drifts right/left Gait velocity: good speed Gait velocity interpretation: 1.31 - 2.62 ft/sec, indicative of limited community ambulator General Gait Details: More  steady gait today using rollator; cues how to unlock brakes. Better with turns. Sp02 dropped to 87% on RA, needed 2L to maintain >90%.   Stairs             Wheelchair Mobility    Modified Rankin (Stroke Patients Only)       Balance Overall balance assessment: Needs assistance Sitting-balance support: Feet supported;No upper extremity supported Sitting balance-Leahy Scale: Good Sitting balance - Comments: Assist to donn shoes however pt able to reach down and assist without LOB.   Standing balance support: During functional activity Standing balance-Leahy Scale: Poor Standing balance comment: reliant on external support with ambulation                            Cognition Arousal/Alertness: Awake/alert Behavior During Therapy: WFL for tasks assessed/performed Overall Cognitive Status: Impaired/Different from baseline Area of Impairment: Safety/judgement;Awareness                     Memory: Decreased short-term memory   Safety/Judgement: Decreased awareness of deficits;Decreased awareness of safety Awareness: Emergent Problem Solving: Slow processing;Requires verbal cues General Comments: better awareness today.      Exercises      General Comments        Pertinent Vitals/Pain Pain Assessment: No/denies pain    Home Living                      Prior Function            PT Goals (current goals can now be found in the care plan section) Progress towards PT  goals: Progressing toward goals    Frequency    Min 3X/week      PT Plan Current plan remains appropriate    Co-evaluation              AM-PAC PT "6 Clicks" Mobility   Outcome Measure  Help needed turning from your back to your side while in a flat bed without using bedrails?: A Little Help needed moving from lying on your back to sitting on the side of a flat bed without using bedrails?: A Little Help needed moving to and from a bed to a chair (including  a wheelchair)?: A Little Help needed standing up from a chair using your arms (e.g., wheelchair or bedside chair)?: A Little Help needed to walk in hospital room?: A Little Help needed climbing 3-5 steps with a railing? : A Lot 6 Click Score: 17    End of Session Equipment Utilized During Treatment: Gait belt;Oxygen Activity Tolerance: Treatment limited secondary to medical complications (Comment);Patient tolerated treatment well (drop in SP02 on RA) Patient left: in chair;with call bell/phone within reach Nurse Communication: Mobility status PT Visit Diagnosis: Other abnormalities of gait and mobility (R26.89);Muscle weakness (generalized) (M62.81);Difficulty in walking, not elsewhere classified (R26.2)     Time: 5009-3818 PT Time Calculation (min) (ACUTE ONLY): 22 min  Charges:  $Gait Training: 8-22 mins                     Marisa Severin, PT, DPT Acute Rehabilitation Services Pager [phone] Office [place] 09/20/2020, 12:51 PM

## 2020-09-21 ENCOUNTER — Telehealth: Payer: Self-pay

## 2020-09-21 ENCOUNTER — Telehealth: Payer: Self-pay | Admitting: Cardiology

## 2020-09-21 NOTE — Telephone Encounter (Signed)
Patient has an appt with Dr. Meda Coffee tomorrow. He just got home from the hospital yesterday for viral pneumonia. He has an oxygen tank that he brought home with him but he only used it for a few minutes and his wife states that he has been fine without it. Mrs. Going wants to know if Dr. Meda Coffee recommends he brings the oxygen with him to his appointment tomorrow or if she thinks he will be fine without it. Please advise.

## 2020-09-21 NOTE — Telephone Encounter (Signed)
Transition Care Management Follow-up Telephone Call  Date of discharge and from where: 09/20/20-[place]  How have you been since you were released from the hospital? Good-eating & sleeping well  Any questions or concerns? No  Items Reviewed:  Did the pt receive and understand the discharge instructions provided? Yes   Medications obtained and verified? Yes   Other? Yes   Any new allergies since your discharge? No   Dietary orders reviewed? Yes  Do you have support at home? Yes   Home Care and Equipment/Supplies: Were home health services ordered? yes If so, what is the name of the agency? Wife states they have called but she doesn't know thw name  Has the agency set up a time to come to the patient's home? no Were any new equipment or medical supplies ordered?  Yes: oxygen What is the name of the medical supply agency? [facility] Were you able to get the supplies/equipment? yes Do you have any questions related to the use of the equipment or supplies? No  Functional Questionnaire: (I = Independent and D = Dependent) ADLs: I  Bathing/Dressing- I  Meal Prep- I-with assistance  Eating- I  Maintaining continence- I  Transferring/Ambulation- I  Managing Meds- I  Follow up appointments reviewed:   PCP Hospital f/u appt confirmed? Yes  Scheduled to see Dr. Larose Kells on 09/29/20 @ 11:20.  [facility] f/u appt confirmed? N/A   Are transportation arrangements needed? No   If their condition worsens, is the pt aware to call PCP or go to the Emergency Dept.? Yes  Was the patient provided with contact information for the PCP's office or ED? Yes  Was to pt encouraged to call back with questions or concerns? Yes

## 2020-09-21 NOTE — Telephone Encounter (Signed)
Pts wife is calling to let Dr. Meda Coffee and I know that the pt just got discharged from the hospital late yesterday for viral pneumonia and he has a follow-up appt with Dr. Meda Coffee scheduled for tomorrow 11/4 at 0920.  Wife states he was discharged on continuous O2 at 2 L/min for his pneumonia and until he is fully recovered.  Wife states the pt is still very weak and trying to adapt to being on O2.  Wife states they prefer switching his OV to a virtual visit, if Dr. Meda Coffee is ok with that.  Wife states the pt will be here as scheduled for his echo on 11/17, as ordered in the hospital to have done outpatient. Wife states its just too much for her to handle having to get him in the car and to the office tomorrow, while helping him ambulate and being on oxygen. Informed the pts wife that we will switch his office visit to a virtual visit with Dr. Meda Coffee, as scheduled for tomorrow 11/4.  Advised the pts wife that we will call 15 mins ahead of his appt to get his BP/HR and review his meds.  Good contact number confirmed with the pts wife.  Informed the pts wife that I will route this message to Dr. Meda Coffee as an Juluis Rainier, to make her aware he will be virtual tomorrow with her, instead of an OV. Wife verbalized understanding and agrees with this plan. Wife was more than gracious for all the assistance provided.

## 2020-09-22 ENCOUNTER — Other Ambulatory Visit: Payer: Self-pay

## 2020-09-22 ENCOUNTER — Encounter: Payer: Self-pay | Admitting: *Deleted

## 2020-09-22 ENCOUNTER — Encounter: Payer: Self-pay | Admitting: Cardiology

## 2020-09-22 ENCOUNTER — Telehealth (INDEPENDENT_AMBULATORY_CARE_PROVIDER_SITE_OTHER): Payer: Medicare Other | Admitting: Cardiology

## 2020-09-22 VITALS — BP 91/50 | HR 95 | Ht 67.0 in | Wt 130.0 lb

## 2020-09-22 DIAGNOSIS — M1990 Unspecified osteoarthritis, unspecified site: Secondary | ICD-10-CM | POA: Diagnosis not present

## 2020-09-22 DIAGNOSIS — H3530 Unspecified macular degeneration: Secondary | ICD-10-CM | POA: Diagnosis not present

## 2020-09-22 DIAGNOSIS — K219 Gastro-esophageal reflux disease without esophagitis: Secondary | ICD-10-CM | POA: Diagnosis not present

## 2020-09-22 DIAGNOSIS — I5022 Chronic systolic (congestive) heart failure: Secondary | ICD-10-CM | POA: Diagnosis not present

## 2020-09-22 DIAGNOSIS — J9691 Respiratory failure, unspecified with hypoxia: Secondary | ICD-10-CM | POA: Diagnosis not present

## 2020-09-22 DIAGNOSIS — I252 Old myocardial infarction: Secondary | ICD-10-CM | POA: Diagnosis not present

## 2020-09-22 DIAGNOSIS — D649 Anemia, unspecified: Secondary | ICD-10-CM | POA: Diagnosis not present

## 2020-09-22 DIAGNOSIS — E782 Mixed hyperlipidemia: Secondary | ICD-10-CM | POA: Diagnosis not present

## 2020-09-22 DIAGNOSIS — I10 Essential (primary) hypertension: Secondary | ICD-10-CM

## 2020-09-22 DIAGNOSIS — M5430 Sciatica, unspecified side: Secondary | ICD-10-CM | POA: Diagnosis not present

## 2020-09-22 DIAGNOSIS — I2510 Atherosclerotic heart disease of native coronary artery without angina pectoris: Secondary | ICD-10-CM

## 2020-09-22 DIAGNOSIS — J189 Pneumonia, unspecified organism: Secondary | ICD-10-CM | POA: Diagnosis not present

## 2020-09-22 DIAGNOSIS — I110 Hypertensive heart disease with heart failure: Secondary | ICD-10-CM | POA: Diagnosis not present

## 2020-09-22 DIAGNOSIS — N400 Enlarged prostate without lower urinary tract symptoms: Secondary | ICD-10-CM | POA: Diagnosis not present

## 2020-09-22 DIAGNOSIS — I351 Nonrheumatic aortic (valve) insufficiency: Secondary | ICD-10-CM | POA: Diagnosis not present

## 2020-09-22 DIAGNOSIS — I5020 Unspecified systolic (congestive) heart failure: Secondary | ICD-10-CM

## 2020-09-22 DIAGNOSIS — I7100 Dissection of unspecified site of aorta: Secondary | ICD-10-CM | POA: Diagnosis not present

## 2020-09-22 DIAGNOSIS — I700 Atherosclerosis of aorta: Secondary | ICD-10-CM | POA: Diagnosis not present

## 2020-09-22 DIAGNOSIS — J9601 Acute respiratory failure with hypoxia: Secondary | ICD-10-CM | POA: Diagnosis not present

## 2020-09-22 DIAGNOSIS — J8410 Pulmonary fibrosis, unspecified: Secondary | ICD-10-CM | POA: Diagnosis not present

## 2020-09-22 DIAGNOSIS — E871 Hypo-osmolality and hyponatremia: Secondary | ICD-10-CM | POA: Diagnosis not present

## 2020-09-22 NOTE — Patient Instructions (Addendum)
Medication Instructions:   Your physician recommends that you continue on your current medications as directed. Please refer to the Current Medication list given to you today.  *If you need a refill on your cardiac medications before your next appointment, please call your pharmacy*    Testing/Procedures:  YOU DO NOT HAVE TO COME IN FOR YOUR ECHO ON 10/05/20 PER DR. Romero Liner TEST DOES NOT NEED TO BE REPEATED, SO WE WILL CANCEL THIS APPOINTMENT   Follow-Up:  IN THE OFFICE WITH VIN BHAGAT PA-C ON Tuesday 10/11/20 AT 3:15 PM.  THIS IS AN IN PERSON OFFICE VISIT.

## 2020-09-22 NOTE — Progress Notes (Signed)
Virtual Visit via Telephone Note   This visit type was conducted due to national recommendations for restrictions regarding the COVID-19 Pandemic (e.g. social distancing) in an effort to limit this patient's exposure and mitigate transmission in our community.  Due to his co-morbid illnesses, this patient is at least at moderate risk for complications without adequate follow up.  This format is felt to be most appropriate for this patient at this time.  The patient did not have access to video technology/had technical difficulties with video requiring transitioning to audio format only (telephone).  All issues noted in this document were discussed and addressed.  No physical exam could be performed with this format.  Please refer to the patient's chart for his  consent to telehealth for [facility].   Date:  09/22/2020   ID:  Christopher Hogan, DOB 1925-03-16, MRN [id number] The patient was identified using 2 identifiers.  Patient Location: Home Provider Location: Office/Clinic  PCP:  Colon Branch, MD  Cardiologist:  Ena Dawley, MD  Electrophysiologist:  None   Evaluation Performed:  Follow-Up Visit  Chief Complaint:  Follow-up (CAD, CHF), Shortness of Breath, and Chest Pain   Patient Profile: Christopher Hogan is a 86 y.o. male with:  Coronary artery disease  S/p remote inf MI 1989 tx with PTCA   Myoview in 2017 - inf scar, no ischemia   Heart failure with reduced ejection fraction   Ischemic CM  Myoview 2/17: EF 33  Hypertension   Hyperlipidemia   AAA s/p repair in 2003   Prior CV Studies: Myoview 01/13/16 EF 33, ant-lat and apical lat infarct, inf-lat and inf infarct, apical septal infarct; no ischemia  History of Present Illness:    Christopher Hogan is a 86 y.o. male with  with a history of remote inferior wall MI treated with PTCA. His last Myoview was 2017 and showed inferior scar without ischemia. He has LVD with an EF of 35-40%. In addition he has treated  HTN, HLD, and is s/p AAA repair in 2003.  The patient was seen in January 2021 and was doing well at that time he had no signs of heart failure or angina.  He called to our clinic last week with worsening dyspnea low oxygen saturations, fatigue, he was advised to go to the hospital and was admitted on September 14, 2020 with acute respiratory failure secondary to community-acquired pneumonia. In ED, vital signs within normal except for elevated respiratory rate to mid and upper 20s. 94% on RA. WBC 8.1. Hgb 12.7. Na 134. BNP 389. Troponin negative x2. D-dimer 3.46. CTA chest negative for PE but multifocal pneumonia concerning for viral etiology. Mildly elevated inflammatory markers including CRP and LDH as well. COVID-19, influenza A and B and RSV negative x2. Empirically started on azithromycin and Zosyn for community-acquired pneumonia and admitted for "hypoxemic respiratory failure".   Procalcitonin negative. Discontinued IV Zosyn. Desaturated to 86% at rest and 82% with ambulation requiring 4 L to recover to 88%. TTE with EF of 35 to 40% and RWMA with hypokinesis of inferolateral wall and basal mid lateral wall. There is no prior echo in his chart of care everywhere to compare to but history of systolic CHF with EF of 58-85% per last cardiology note in 11/2019. Siscussed TTE with on-call cardiologist, Dr. Radford Pax who recommended outpatient follow-up with his cardiologist, Dr. Meda Coffee.Patient was stareted ontrialoflow doseIV Lasix, and eventually switched to oral lasix 20 mg daily. We also stopped amlodipine and switched atenolol to  metoprolol XL given systolic CHF. He has upcoming appointment with cardiology in two days.  PCCM consulted about respiratory failure, and recommended continuing current care with azithromycine for atypical coverage. PCCM to arrange outpatient follow up and repeat CT in about 6-8 weeks.  On the day of discharge, he felt well but couldn't be liberated of oxygen.  He was discharged on 2L by [place]. HHPT/OT ordered as recommended by therapy.   Today he states he feels significantly better, he is now able to walk around his house without oxygen, he does not have a pulse ox at home to check it, he will have a visiting nurse today that we will obtain his vitals.  He feels he is almost back to baseline.  He denies any orthopnea proximal nocturnal dyspnea.  No lower extremity edema.  The patient does not have symptoms concerning for COVID-19 infection (fever, chills, cough, or new shortness of breath).   Past Medical History:  Diagnosis Date  . AAA (abdominal aortic aneurysm) ([place])    repaired 02/2002, renal ultrasound, Sept 2010: normal abdominal aorta, normal renal arteries   . Age-related macular degeneration, wet, left eye ([place])   . Allergic rhinitis   . Arthritis   . At risk for pulmonary fibrosis 06/25/2018  . CAD (coronary artery disease)    stable stress test 7/11  . CHF (congestive heart failure) ([place])   . Chronic systolic CHF (congestive heart failure) ([place]) 12/17/2013  . Coronary atherosclerosis 06/09/2007   H/O inferior wall MI- PTCA- 1989 Last stress test 2/17- inferior scar, no ischemia    . DEGENERATIVE JOINT DISEASE 03/17/2010   Qualifier: Diagnosis of  By: Dawson Bills    . Dyslipidemia 05/06/2007   LDL 59 Jan 2019     . Eczema 07/09/2013  . ED (erectile dysfunction)   . Epidermoid cyst of skin of chest 09/29/2018  . ERECTILE DYSFUNCTION 12/09/2007   Qualifier: Diagnosis of  By: Larose Kells MD, [place] hypertension 04/30/2007   Qualifier: Diagnosis of  By: Larose Kells MD, [place] GERD 07/21/2008   Qualifier: Diagnosis of  By: Larose Kells MD, [place] GERD (gastroesophageal reflux disease)   . Glaucoma and macular degeneration 12/31/2011  . Hyperlipidemia   . Hypertension   . Inguinal hernia 12/31/2011  . Insomnia 02/22/2018  . Ischemic cardiomyopathy 06/23/2018   Last EF 30-44% by Myoview Feb 2017  . Left inguinal hernia 06/30/2018  . Myocardial  infarction ([place]) 1989   interior wall infarction  . PCP notes >>>>>>>>>>>>>>>>>>>> 07/17/2016  . Pneumonia 2019  . Pre-operative cardiovascular examination 12/31/2011  . Rhinitis 04/20/2008   Qualifier: Diagnosis of  By: Larose Kells MD, [place] Vertigo 06/20/2015   Past Surgical History:  Procedure Laterality Date  . AAA repair  2003  . CARDIAC CATHETERIZATION    . CHOLECYSTECTOMY    . INGUINAL HERNIA REPAIR Right 1985  . INGUINAL HERNIA REPAIR Left 06/30/2018   open; w/mesh  . INGUINAL HERNIA REPAIR Left 06/30/2018   Procedure: OPEN LEFT INGUINAL HERNIA REPAIR;  Surgeon: Fanny Skates, MD;  Location: [facility];  Service: General;  Laterality: Left;  . INSERTION OF MESH Left 06/30/2018   Procedure: INSERTION OF MESH;  Surgeon: Fanny Skates, MD;  Location: [facility];  Service: General;  Laterality: Left;  Marland Kitchen MASS EXCISION N/A 09/29/2018   Procedure: EXCISION OF 3 CM MASS ANTERIOR CHEST WALL;  Surgeon: Fanny Skates, MD;  Location: [facility];  Service: General;  Laterality: N/A;  . PTCA  1989, 1992     Current Meds  Medication Sig  . acetaminophen (TYLENOL) 500 MG tablet Take 1,000 mg by mouth every 6 (six) hours as needed for mild pain.  Marland Kitchen albuterol (VENTOLIN HFA) 108 (90 Base) MCG/ACT inhaler Inhale 2 puffs into the lungs every 6 (six) hours as needed for wheezing or shortness of breath.  Marland Kitchen aspirin 81 MG chewable tablet Chew 81 mg by mouth daily.   Marland Kitchen azelastine (ASTELIN) 0.1 % nasal spray Place 2 sprays into both nostrils 2 (two) times daily. Use in each nostril as directed  . betamethasone valerate (VALISONE) 0.1 % cream Apply 1 application topically daily.  . Dorzolamide HCl-Timolol Mal PF 22.3-6.8 MG/ML SOLN Place 1 drop into both eyes 2 (two) times daily.   . fenofibrate (TRICOR) 48 MG tablet Take 48 mg by mouth daily.  . fluticasone (FLONASE) 50 MCG/ACT nasal spray Place 1 spray into both nostrils daily.  . furosemide (LASIX) 20 MG tablet Take 1 tablet (20 mg total) by  mouth daily.  Marland Kitchen HYDROcodone-acetaminophen (NORCO) 5-325 MG tablet 0.5-1 tablet po bid prn pain  . isosorbide mononitrate (IMDUR) 30 MG 24 hr tablet Take 0.5 tablets (15 mg total) by mouth daily.  Marland Kitchen latanoprost (XALATAN) 0.005 % ophthalmic solution Place 1 drop into the right eye at bedtime.  Marland Kitchen loratadine (CLARITIN) 10 MG tablet Take 1 tablet (10 mg total) by mouth daily.  Marland Kitchen MELATONIN PO Take 3 mg by mouth at bedtime as needed (sleep).   . metoprolol succinate (TOPROL-XL) 50 MG 24 hr tablet Take 1 tablet (50 mg total) by mouth daily. Take with or immediately following a meal.  . Multiple Vitamins-Minerals (ICAPS AREDS FORMULA PO) Take 1 capsule by mouth in the morning and at bedtime.   . nitroGLYCERIN (NITROSTAT) 0.4 MG SL tablet Place 1 tablet (0.4 mg total) under the tongue every 5 (five) minutes as needed for chest pain.  . pantoprazole (PROTONIX) 40 MG tablet Take 1 tablet (40 mg total) by mouth daily.  Marland Kitchen senna-docusate (SENOKOT-S) 8.6-50 MG tablet Take 1 tablet by mouth 2 (two) times daily as needed for moderate constipation.  . simvastatin (ZOCOR) 10 MG tablet Take 1 tablet (10 mg total) by mouth at bedtime.  . tamsulosin (FLOMAX) 0.4 MG CAPS capsule Take 1 capsule (0.4 mg total) by mouth daily after supper.     Allergies:   Ace inhibitors, Aspirin, and Spironolactone   Social History   Tobacco Use  . Smoking status: Former Research scientist (life sciences)  . Smokeless tobacco: Never Used  . Tobacco comment: quit in 1953  Vaping Use  . Vaping Use: Never used  Substance Use Topics  . Alcohol use: No  . Drug use: No     Family Hx: The patient's family history includes CAD in an other family member; Kidney Stones in his son; Lung cancer in his father. There is no history of Prostate cancer, Diabetes, or Colon cancer.  ROS:   Please see the history of present illness.    All other systems reviewed and are negative.   Prior CV studies:   The following studies were reviewed today:  09/17/2020 1. Left  ventricular ejection fraction, by estimation, is 35 to 40%. The  left ventricle has moderately decreased function. The left ventricle  demonstrates regional wall motion abnormalities (see scoring  diagram/findings for description). Left ventricular  diastolic parameters are indeterminate. There is hypokinesis of the left  ventricular, entire inferolateral wall. There  is hypokinesis of the left  ventricular, basal-mid lateral wall.  2. Right ventricular systolic function is normal. The right ventricular  size is normal.  3. The mitral valve is normal in structure. Trivial mitral valve  regurgitation. No evidence of mitral stenosis.  4. The aortic valve has an indeterminant number of cusps. Aortic valve  regurgitation is mild. Mild to moderate aortic valve  sclerosis/calcification is present, without any evidence of aortic  stenosis.  5. Aortic dilatation noted. There is mild dilatation of the aortic root,  measuring 40 mm. There is mild dilatation of the ascending aorta,  measuring 37 mm.  6. The inferior vena cava is normal in size with greater than 50%  respiratory variability, suggesting right atrial pressure of 3 mmHg.  7. Recommend limited echo with definity for better assessmen of wall  motion as anterior wall not well visualized in apical and parasternal  short axis views.   Labs/Other Tests and Data Reviewed:    EKG:  No ECG reviewed.  Recent Labs: 09/16/2020: B Natriuretic Peptide 389.3 09/17/2020: ALT 39 09/18/2020: TSH 1.535 09/19/2020: Hemoglobin 11.9; Platelets 238 09/20/2020: BUN 19; Creatinine, Ser 0.90; Magnesium 2.0; Potassium 4.0; Sodium 133   Recent Lipid Panel Lab Results  Component Value Date/Time   CHOL 109 10/28/2019 10:56 AM   TRIG 152.0 (H) 10/28/2019 10:56 AM   HDL 38.80 (L) 10/28/2019 10:56 AM   CHOLHDL 3 10/28/2019 10:56 AM   LDLCALC 39 10/28/2019 10:56 AM   LDLDIRECT 57.3 07/29/2014 08:20 AM    Wt Readings from Last 3 Encounters:   09/22/20 130 lb (59 kg)  09/20/20 138 lb 9.6 oz (62.9 kg)  09/14/20 149 lb (67.6 kg)     Objective:    Vital Signs:  BP (!) 91/50   Pulse 95   Ht 5\' 7"  (1.702 m)   Wt 130 lb (59 kg)   BMI 20.36 kg/m    VITAL SIGNS:  reviewed  ASSESSMENT & PLAN:    1. Community-acquired pneumonia, aided with azithromycin and IV Zosyn, significant improvement, we will bring him back in 10 to 14 days to reevaluate the need for home O2 therapy.  2. Coronary artery disease involving native coronary artery of native heart with angina pectoris [facility]) -he is currently asymptomatic.  Aspirin, simvastatin and fenofibrate.  As well as Toprol-XL  3. HFrEF (heart failure with reduced ejection fraction) ([place]) -he had echocardiogram performed in hospital with LVEF 35 to 40% that is unchanged from prior. Based on patient's symptoms he seems to be euvolemic.  In fact he has been losing tensive weight, his wife is encouraged to make him any foods that willing increase his appetite. Continue Toprol-XL.  Add ACE or ARB at the next visit if his blood pressure improves.  4. Essential hypertension The patient's blood pressure is rather low.   COVID-19 Education: The signs and symptoms of COVID-19 were discussed with the patient and how to seek care for testing (follow up with PCP or arrange E-visit).  The importance of social distancing was discussed today.  Time:   Today, I have spent 30 minutes with the patient with telehealth technology discussing the above problems.    Medication Adjustments/Labs and Tests Ordered: Current medicines are reviewed at length with the patient today.  Concerns regarding medicines are outlined above.   Tests Ordered: No orders of the defined types were placed in this encounter.   Medication Changes: No orders of the defined types were placed in this encounter.   Follow  Up:  In Person in 2 week(s)  Signed, Ena Dawley, MD  09/22/2020 10:50 AM    [place]

## 2020-09-23 ENCOUNTER — Telehealth: Payer: Self-pay | Admitting: Internal Medicine

## 2020-09-23 NOTE — Telephone Encounter (Signed)
Caller name: Gerald Stabs [facility]) Call back number: [phone]  Verbal for PT 1 time a week for 1 week 2 Time a week for 2 week 1 time a week for 2 week  Ok to leave message

## 2020-09-23 NOTE — Telephone Encounter (Signed)
Spoke w/ Gerald Stabs- verbal orders given.

## 2020-09-26 ENCOUNTER — Telehealth: Payer: Self-pay | Admitting: Internal Medicine

## 2020-09-26 NOTE — Telephone Encounter (Signed)
Spoke w/ Joseph Art- verbal orders given.

## 2020-09-26 NOTE — Telephone Encounter (Signed)
Call Back # [phone]  Leanne Chang  Is requesting a verbal order for in [place]

## 2020-09-28 ENCOUNTER — Encounter (INDEPENDENT_AMBULATORY_CARE_PROVIDER_SITE_OTHER): Payer: Medicare Other | Admitting: Ophthalmology

## 2020-09-29 ENCOUNTER — Ambulatory Visit: Payer: Medicare Other | Admitting: Internal Medicine

## 2020-09-29 ENCOUNTER — Encounter: Payer: Self-pay | Admitting: Internal Medicine

## 2020-09-29 ENCOUNTER — Other Ambulatory Visit: Payer: Self-pay

## 2020-09-29 VITALS — BP 120/68 | HR 75 | Temp 97.6°F | Resp 18 | Ht 67.0 in | Wt 141.5 lb

## 2020-09-29 DIAGNOSIS — R531 Weakness: Secondary | ICD-10-CM

## 2020-09-29 DIAGNOSIS — I5022 Chronic systolic (congestive) heart failure: Secondary | ICD-10-CM | POA: Diagnosis not present

## 2020-09-29 DIAGNOSIS — J9691 Respiratory failure, unspecified with hypoxia: Secondary | ICD-10-CM | POA: Diagnosis not present

## 2020-09-29 DIAGNOSIS — I10 Essential (primary) hypertension: Secondary | ICD-10-CM | POA: Diagnosis not present

## 2020-09-29 LAB — CBC WITH DIFFERENTIAL/PLATELET
Basophils Absolute: 0.1 10*3/uL (ref 0.0–0.1)
Basophils Relative: 1 % (ref 0.0–3.0)
Eosinophils Absolute: 0.2 10*3/uL (ref 0.0–0.7)
Eosinophils Relative: 2.6 % (ref 0.0–5.0)
HCT: 38.3 % — ABNORMAL LOW (ref 39.0–52.0)
Hemoglobin: 12.9 g/dL — ABNORMAL LOW (ref 13.0–17.0)
Lymphocytes Relative: 11.4 % — ABNORMAL LOW (ref 12.0–46.0)
Lymphs Abs: 1 10*3/uL (ref 0.7–4.0)
MCHC: 33.7 g/dL (ref 30.0–36.0)
MCV: 94.8 fl (ref 78.0–100.0)
Monocytes Absolute: 1 10*3/uL (ref 0.1–1.0)
Monocytes Relative: 11.2 % (ref 3.0–12.0)
Neutro Abs: 6.4 10*3/uL (ref 1.4–7.7)
Neutrophils Relative %: 73.8 % (ref 43.0–77.0)
Platelets: 160 10*3/uL (ref 150.0–400.0)
RBC: 4.04 Mil/uL — ABNORMAL LOW (ref 4.22–5.81)
RDW: 15.7 % — ABNORMAL HIGH (ref 11.5–15.5)
WBC: 8.7 10*3/uL (ref 4.0–10.5)

## 2020-09-29 LAB — COMPREHENSIVE METABOLIC PANEL
ALT: 14 U/L (ref 0–53)
AST: 14 U/L (ref 0–37)
Albumin: 3.7 g/dL (ref 3.5–5.2)
Alkaline Phosphatase: 49 U/L (ref 39–117)
BUN: 15 mg/dL (ref 6–23)
CO2: 29 mEq/L (ref 19–32)
Calcium: 8.5 mg/dL (ref 8.4–10.5)
Chloride: 97 mEq/L (ref 96–112)
Creatinine, Ser: 0.85 mg/dL (ref 0.40–1.50)
GFR: 75.17 mL/min (ref >60.00)
Glucose, Bld: 93 mg/dL (ref 70–99)
Potassium: 4.1 mEq/L (ref 3.5–5.1)
Sodium: 132 mEq/L — ABNORMAL LOW (ref 135–145)
Total Bilirubin: 0.6 mg/dL (ref 0.2–1.2)
Total Protein: 5.6 g/dL — ABNORMAL LOW (ref 6.0–8.3)

## 2020-09-29 NOTE — Progress Notes (Signed)
Subjective:    Patient ID: Christopher Hogan, male    DOB: Nov 23, 1924, 86 y.o.   MRN: [id number]  DOS:  09/29/2020 Type of visit - description: TCM 14 Patient was seen at this office on 08/31/2020, at the time he had non specific fatigue. Eventually presented to the ER 09/16/2020 with worsening shortness of breath, increasing fatigue, tiredness, increased respiratory rate, DX with respiratory failure, Covid test negative, D-dimer elevated, CTA chest negative for PE but noted multifocal pneumonia, likely viral. Was initially prescribed antibiotics, procalcitonin negative, Zosyn discontinued.  Zithromax continue for possible atypical pneumonia. He was also hypoxic. EF was decreased, cardiology, recommended evaluation as an outpatient, they did start Lasix, stop amlodipine and switch atenolol to metoprolol given DX of CHF. Was discharged home with PT, OT, need follow-up with pulmonary and cardiology.   Review of Systems Since he left the hospital he is at home. No fever chills Appetite is good No chest pain. No difficulty breathing at rest, some with exertion. Overall breathing is better he said. No nausea, vomiting, diarrhea. No chest congestion or wheezing. Occasional cough, he believes due to postnasal dripping.  Past Medical History:  Diagnosis Date  . AAA (abdominal aortic aneurysm) ([place])    repaired 02/2002, renal ultrasound, Sept 2010: normal abdominal aorta, normal renal arteries   . Age-related macular degeneration, wet, left eye ([place])   . Allergic rhinitis   . Arthritis   . At risk for pulmonary fibrosis 06/25/2018  . CAD (coronary artery disease)    stable stress test 7/11  . CHF (congestive heart failure) ([place])   . Chronic systolic CHF (congestive heart failure) ([place]) 12/17/2013  . Coronary atherosclerosis 06/09/2007   H/O inferior wall MI- [facility]- 1989 Last stress test 2/17- inferior scar, no ischemia    . DEGENERATIVE JOINT DISEASE 03/17/2010   Qualifier: Diagnosis of  By:  Dawson Bills    . Dyslipidemia 05/06/2007   LDL 59 Jan 2019     . Eczema 07/09/2013  . ED (erectile dysfunction)   . Epidermoid cyst of skin of chest 09/29/2018  . ERECTILE DYSFUNCTION 12/09/2007   Qualifier: Diagnosis of  By: Larose Kells MD, [place] hypertension 04/30/2007   Qualifier: Diagnosis of  By: Larose Kells MD, [place] GERD 07/21/2008   Qualifier: Diagnosis of  By: Larose Kells MD, [place] GERD (gastroesophageal reflux disease)   . Glaucoma and macular degeneration 12/31/2011  . Hyperlipidemia   . Hypertension   . Inguinal hernia 12/31/2011  . Insomnia 02/22/2018  . Ischemic cardiomyopathy 06/23/2018   Last EF 30-44% by Myoview Feb 2017  . Left inguinal hernia 06/30/2018  . Myocardial infarction ([place]) 1989   interior wall infarction  . PCP notes >>>>>>>>>>>>>>>>>>>> 07/17/2016  . Pneumonia 2019  . Pre-operative cardiovascular examination 12/31/2011  . Rhinitis 04/20/2008   Qualifier: Diagnosis of  By: Larose Kells MD, [place] Vertigo 06/20/2015    Past Surgical History:  Procedure Laterality Date  . AAA repair  2003  . CARDIAC CATHETERIZATION    . CHOLECYSTECTOMY    . INGUINAL HERNIA REPAIR Right 1985  . INGUINAL HERNIA REPAIR Left 06/30/2018   open; w/mesh  . INGUINAL HERNIA REPAIR Left 06/30/2018   Procedure: OPEN LEFT INGUINAL HERNIA REPAIR;  Surgeon: Fanny Skates, MD;  Location: [facility];  Service: General;  Laterality: Left;  . INSERTION OF MESH Left 06/30/2018   Procedure: INSERTION OF MESH;  Surgeon: Fanny Skates, MD;  Location:  [facility];  Service: General;  Laterality: Left;  Marland Kitchen MASS EXCISION N/A 09/29/2018   Procedure: EXCISION OF 3 CM MASS ANTERIOR CHEST WALL;  Surgeon: Fanny Skates, MD;  Location: [facility];  Service: General;  Laterality: N/A;  . [place]    Allergies as of 09/29/2020      Reactions   Ace Inhibitors Cough   Aspirin Nausea Only, Other (See Comments)   Full strength only   Spironolactone Rash      Medication List        Accurate as of September 29, 2020 11:59 PM. If you have any questions, ask your nurse or doctor.        acetaminophen 500 MG tablet Commonly known as: TYLENOL Take 1,000 mg by mouth every 6 (six) hours as needed for mild pain.   albuterol 108 (90 Base) MCG/ACT inhaler Commonly known as: VENTOLIN HFA Inhale 2 puffs into the lungs every 6 (six) hours as needed for wheezing or shortness of breath.   aspirin 81 MG chewable tablet Chew 81 mg by mouth daily.   azelastine 0.1 % nasal spray Commonly known as: ASTELIN Place 2 sprays into both nostrils 2 (two) times daily. Use in each nostril as directed   betamethasone valerate 0.1 % cream Commonly known as: VALISONE Apply 1 application topically daily.   dorzolamidel-timolol 22.3-6.8 MG/ML Soln ophthalmic solution Commonly known as: COSOPT Place 1 drop into both eyes 2 (two) times daily.   fenofibrate 48 MG tablet Commonly known as: TRICOR Take 48 mg by mouth daily.   fluticasone 50 MCG/ACT nasal spray Commonly known as: FLONASE Place 1 spray into both nostrils daily.   furosemide 20 MG tablet Commonly known as: LASIX Take 1 tablet (20 mg total) by mouth daily.   HYDROcodone-acetaminophen 5-325 MG tablet Commonly known as: Norco 0.5-1 tablet po bid prn pain   ICAPS AREDS FORMULA PO Take 1 capsule by mouth in the morning and at bedtime.   isosorbide mononitrate 30 MG 24 hr tablet Commonly known as: IMDUR Take 0.5 tablets (15 mg total) by mouth daily.   latanoprost 0.005 % ophthalmic solution Commonly known as: XALATAN Place 1 drop into the right eye at bedtime.   loratadine 10 MG tablet Commonly known as: CLARITIN Take 1 tablet (10 mg total) by mouth daily.   MELATONIN PO Take 3 mg by mouth at bedtime as needed (sleep).   metoprolol succinate 50 MG 24 hr tablet Commonly known as: TOPROL-XL Take 1 tablet (50 mg total) by mouth daily. Take with or immediately following a meal.   nitroGLYCERIN 0.4 MG SL  tablet Commonly known as: NITROSTAT Place 1 tablet (0.4 mg total) under the tongue every 5 (five) minutes as needed for chest pain.   pantoprazole 40 MG tablet Commonly known as: PROTONIX Take 1 tablet (40 mg total) by mouth daily.   senna-docusate 8.6-50 MG tablet Commonly known as: Senokot-S Take 1 tablet by mouth 2 (two) times daily as needed for moderate constipation.   simvastatin 10 MG tablet Commonly known as: ZOCOR Take 1 tablet (10 mg total) by mouth at bedtime.   tamsulosin 0.4 MG Caps capsule Commonly known as: FLOMAX Take 1 capsule (0.4 mg total) by mouth daily after supper.          Objective:   Physical Exam BP 120/68 (BP Location: Left Arm, Patient Position: Sitting, Cuff Size: Small)   Pulse 75   Temp 97.6 F (36.4 C) (Oral)   Resp 18  Ht 5\' 7"  (1.702 m)   Wt 141 lb 8 oz (64.2 kg)   SpO2 96%   BMI 22.16 kg/m   General:   Well developed, NAD, BMI noted. HEENT:  Normocephalic . Face symmetric, atraumatic Lungs:  Decreased breath sounds , + dry crackles at bases. Normal respiratory effort, no intercostal retractions, no accessory muscle use. Heart: RRR,  no murmur.  Lower extremities: no pretibial edema bilaterally  Skin: Not pale. Not jaundice Neurologic:  alert & oriented X3.  Speech normal, gait appropriate for age and unassisted Psych--  Cognition and judgment appear intact.  Cooperative with normal attention span and concentration.  Behavior appropriate. No anxious or depressed appearing.      Assessment    Assessment HTN Hyperlipidemia GERD Insomnia-- used to use elavil, re start  prn All rhinitis CV: --CAD, MI 1989, stress test (-) 2011 ,lexiscan no ischemia  08-2016  --CHF --AAA, s/p repair 2003, [place] 2010 ok Opht: glaucoma, macular degeneration  [facility]  Question of Pulmonary fibrosis (on clinical grounds, see note from 10/2019) Barium swallow on 05/13/2020: Rx chin tuck, small sips Back pain  admited 08-2020,  resp Failure, d/c on home O2   PLAN: Respiratory failure: Admitted to the hospital, work-up including a CTA, no PE but findings consistent with a viral pneumonia.  Covid test was negative. Was treated with initially IV antibiotics and subsequently Zithromax and prednisone. Discharge on oxygen. O2 sat upon arrival was low, recheck at rest: 96% He has a number of questions about it, I recommend to use it 24/7 however the portable oxygen tanks he receives are extremely heavy and they cannot use it. Recommend to discuss further with pulmonary CAD, CHF, HTN: EF was noted to be decreased, cardiology recommended outpatient eval.. Amlodipine stopped.  Lasix is started, atenolol switched to metoprolol. Good med compliance, were checking labs. BP at home checked by day patient's wife is a slightly low, but BP here today and when checked by the physical therapist is okay.  No change. Checking labs. RTC 2 months   This visit occurred during the SARS-CoV-2 public health emergency.  Safety protocols were in place, including screening questions prior to the visit, additional usage of staff PPE, and extensive cleaning of exam room while observing appropriate contact time as indicated for disinfecting solutions.

## 2020-09-29 NOTE — Patient Instructions (Addendum)
Happy belated Christopher Hogan!  Continue checking your blood pressure daily if possible BP GOAL is between 110/65 and  135/85. If it is consistently higher or lower, let me know  Use the oxygen all the time if possible.  GO TO THE LAB : Get the blood work     [place], [place] back for a checkup in 2 months

## 2020-09-29 NOTE — Progress Notes (Signed)
Pre visit review using our clinic review tool, if applicable. No additional management support is needed unless otherwise documented below in the visit note. 

## 2020-09-30 NOTE — Assessment & Plan Note (Signed)
Respiratory failure: Admitted to the hospital, work-up including a CTA, no PE but findings consistent with a viral pneumonia.  Covid test was negative. Was treated with initially IV antibiotics and subsequently Zithromax and prednisone. Discharge on oxygen. O2 sat upon arrival was low, recheck at rest: 96% He has a number of questions about it, I recommend to use it 24/7 however the portable oxygen tanks he receives are extremely heavy and they cannot use it. Recommend to discuss further with pulmonary CAD, CHF, HTN: EF was noted to be decreased, cardiology recommended outpatient eval.. Amlodipine stopped.  Lasix is started, atenolol switched to metoprolol. Good med compliance, were checking labs. BP at home checked by day patient's wife is a slightly low, but BP here today and when checked by the physical therapist is okay.  No change. Checking labs. RTC 2 months

## 2020-10-03 ENCOUNTER — Other Ambulatory Visit: Payer: Self-pay

## 2020-10-03 ENCOUNTER — Encounter (INDEPENDENT_AMBULATORY_CARE_PROVIDER_SITE_OTHER): Payer: Medicare Other | Admitting: Ophthalmology

## 2020-10-03 DIAGNOSIS — H35033 Hypertensive retinopathy, bilateral: Secondary | ICD-10-CM | POA: Diagnosis not present

## 2020-10-03 DIAGNOSIS — H43813 Vitreous degeneration, bilateral: Secondary | ICD-10-CM

## 2020-10-03 DIAGNOSIS — I10 Essential (primary) hypertension: Secondary | ICD-10-CM

## 2020-10-03 DIAGNOSIS — H353231 Exudative age-related macular degeneration, bilateral, with active choroidal neovascularization: Secondary | ICD-10-CM

## 2020-10-05 ENCOUNTER — Other Ambulatory Visit (HOSPITAL_COMMUNITY): Payer: Medicare Other

## 2020-10-05 DIAGNOSIS — I7100 Dissection of unspecified site of aorta: Secondary | ICD-10-CM

## 2020-10-05 DIAGNOSIS — Z9981 Dependence on supplemental oxygen: Secondary | ICD-10-CM

## 2020-10-05 DIAGNOSIS — D649 Anemia, unspecified: Secondary | ICD-10-CM

## 2020-10-05 DIAGNOSIS — M5430 Sciatica, unspecified side: Secondary | ICD-10-CM

## 2020-10-05 DIAGNOSIS — J9601 Acute respiratory failure with hypoxia: Secondary | ICD-10-CM

## 2020-10-05 DIAGNOSIS — Z9181 History of falling: Secondary | ICD-10-CM

## 2020-10-05 DIAGNOSIS — H3530 Unspecified macular degeneration: Secondary | ICD-10-CM

## 2020-10-05 DIAGNOSIS — N400 Enlarged prostate without lower urinary tract symptoms: Secondary | ICD-10-CM

## 2020-10-05 DIAGNOSIS — I351 Nonrheumatic aortic (valve) insufficiency: Secondary | ICD-10-CM

## 2020-10-05 DIAGNOSIS — I110 Hypertensive heart disease with heart failure: Secondary | ICD-10-CM

## 2020-10-05 DIAGNOSIS — E871 Hypo-osmolality and hyponatremia: Secondary | ICD-10-CM

## 2020-10-05 DIAGNOSIS — Z7982 Long term (current) use of aspirin: Secondary | ICD-10-CM

## 2020-10-05 DIAGNOSIS — I252 Old myocardial infarction: Secondary | ICD-10-CM

## 2020-10-05 DIAGNOSIS — I5022 Chronic systolic (congestive) heart failure: Secondary | ICD-10-CM

## 2020-10-05 DIAGNOSIS — J189 Pneumonia, unspecified organism: Secondary | ICD-10-CM

## 2020-10-05 DIAGNOSIS — M1990 Unspecified osteoarthritis, unspecified site: Secondary | ICD-10-CM

## 2020-10-05 DIAGNOSIS — I700 Atherosclerosis of aorta: Secondary | ICD-10-CM

## 2020-10-05 DIAGNOSIS — I2510 Atherosclerotic heart disease of native coronary artery without angina pectoris: Secondary | ICD-10-CM

## 2020-10-05 DIAGNOSIS — K219 Gastro-esophageal reflux disease without esophagitis: Secondary | ICD-10-CM

## 2020-10-06 ENCOUNTER — Telehealth: Payer: Self-pay

## 2020-10-06 NOTE — Telephone Encounter (Signed)
Plan of care signed and faxed back to [facility] at [phone]. Form sent for scanning.  °

## 2020-10-11 ENCOUNTER — Other Ambulatory Visit: Payer: Self-pay

## 2020-10-11 ENCOUNTER — Encounter: Payer: Self-pay | Admitting: Physician Assistant

## 2020-10-11 ENCOUNTER — Ambulatory Visit: Payer: Medicare Other | Admitting: Physician Assistant

## 2020-10-11 VITALS — BP 120/74 | HR 91 | Ht 67.0 in | Wt 137.2 lb

## 2020-10-11 DIAGNOSIS — I5020 Unspecified systolic (congestive) heart failure: Secondary | ICD-10-CM | POA: Diagnosis not present

## 2020-10-11 DIAGNOSIS — I2510 Atherosclerotic heart disease of native coronary artery without angina pectoris: Secondary | ICD-10-CM

## 2020-10-11 DIAGNOSIS — I5022 Chronic systolic (congestive) heart failure: Secondary | ICD-10-CM

## 2020-10-11 DIAGNOSIS — E782 Mixed hyperlipidemia: Secondary | ICD-10-CM | POA: Diagnosis not present

## 2020-10-11 DIAGNOSIS — I10 Essential (primary) hypertension: Secondary | ICD-10-CM

## 2020-10-11 MED ORDER — LOSARTAN POTASSIUM 25 MG PO TABS: 12.5000 mg | ORAL_TABLET | Freq: Every day | ORAL | 3 refills | Status: DC

## 2020-10-11 NOTE — Patient Instructions (Addendum)
Medication Instructions:  Your physician has recommended you make the following change in your medication:  1.  START Losartan 25 mg taking 1/2 tablet daily   *If you need a refill on your cardiac medications before your next appointment, please call your pharmacy*   Lab Work: None ordered  If you have labs (blood work) drawn today and your tests are completely normal, you will receive your results only by: Marland Kitchen MyChart Message (if you have MyChart) OR . A paper copy in the mail If you have any lab test that is abnormal or we need to change your treatment, we will call you to review the results.   Testing/Procedures: None ordered   Follow-Up: At [facility], you and your health needs are our priority.  As part of our continuing mission to provide you with exceptional heart care, we have created designated Provider Care Teams.  These Care Teams include your primary Cardiologist (physician) and Advanced Practice Providers (APPs -  Physician Assistants and Nurse Practitioners) who all work together to provide you with the care you need, when you need it.  We recommend signing up for the patient portal called "MyChart".  Sign up information is provided on this After Visit Summary.  MyChart is used to connect with patients for Virtual Visits (Telemedicine).  Patients are able to view lab/test results, encounter notes, upcoming appointments, etc.  Non-urgent messages can be sent to your provider as well.   To learn more about what you can do with MyChart, go to [email or web address].    Your next appointment:   3 month(s)  The format for your next appointment:   In Person  Provider:   You may see Ena Dawley, MD or one of the following Advanced Practice Providers on your designated Care Team:    Melina Copa, PA-C  Ermalinda Barrios, PA-C    Other Instructions

## 2020-10-11 NOTE — Progress Notes (Signed)
Cardiology Office Note:    Date:  10/11/2020   ID:  Christopher Hogan, DOB [birth year], MRN [id number]  PCP:  Colon Branch, MD  [facility] Cardiologist:  Ena Dawley, MD  [place] Electrophysiologist:  None   Chief Complaint: 3 weeks follow up  History of Present Illness:    Christopher Hogan is a 86 y.o. male with a hx of CAD, ICM, HTN, HLD, and s/p AAA repair in 2003 who presents for follow up.   Hx of CAD s/p  remote inf MI 1989 tx with PTCA. Last Myoview in 2017 - inf scar, no ischemia.   Admitted 08/2020 for acute respiratory failure secondary to community-acquired pneumonia.  BNP 389. Troponin negative x2. D-dimer 3.46. CTA chest negative for PE but multifocal pneumonia concerning for viral etiology. Mildly elevated inflammatory markers including CRP and LDH as well. COVID-19, influenza A and B and RSV negative x2. Empirically treated with abx. TTE with EF of 35 to 40% and RWMA with hypokinesis of inferolateral wall and basal mid lateral wall. No prior echo to compare.   Seen by Dr, Coralee Pesa  09/22/2020 with improved symptoms.   Here today for follow up.  His oxygen level is staying above 90% without oxygen.  He has oxygen at home and he uses as needed.  He denies chest discomfort, orthopnea, PND, syncope, lower extremity edema, dizziness or melena.  He has stable shortness of breath which is improving.  He is dealing with runny nose and postnasal drip.  Past Medical History:  Diagnosis Date  . AAA (abdominal aortic aneurysm) ([place])    repaired 02/2002, renal ultrasound, Sept 2010: normal abdominal aorta, normal renal arteries   . Age-related macular degeneration, wet, left eye ([place])   . Allergic rhinitis   . Arthritis   . At risk for pulmonary fibrosis 06/25/2018  . CAD (coronary artery disease)    stable stress test 7/11  . CHF (congestive heart failure) ([place])   . Chronic systolic CHF (congestive heart failure) ([place]) 12/17/2013  . Coronary atherosclerosis  06/09/2007   H/O inferior wall MI- PTCA- 1989 Last stress test 2/17- inferior scar, no ischemia    . DEGENERATIVE JOINT DISEASE 03/17/2010   Qualifier: Diagnosis of  By: Dawson Bills    . Dyslipidemia 05/06/2007   LDL 59 Jan 2019     . Eczema 07/09/2013  . ED (erectile dysfunction)   . Epidermoid cyst of skin of chest 09/29/2018  . ERECTILE DYSFUNCTION 12/09/2007   Qualifier: Diagnosis of  By: Larose Kells MD, [place] hypertension 04/30/2007   Qualifier: Diagnosis of  By: Larose Kells MD, [place] GERD 07/21/2008   Qualifier: Diagnosis of  By: Larose Kells MD, [place] GERD (gastroesophageal reflux disease)   . Glaucoma and macular degeneration 12/31/2011  . Hyperlipidemia   . Hypertension   . Inguinal hernia 12/31/2011  . Insomnia 02/22/2018  . Ischemic cardiomyopathy 06/23/2018   Last EF 30-44% by Myoview Feb 2017  . Left inguinal hernia 06/30/2018  . Myocardial infarction ([place]) 1989   interior wall infarction  . PCP notes >>>>>>>>>>>>>>>>>>>> 07/17/2016  . Pneumonia 2019  . Pre-operative cardiovascular examination 12/31/2011  . Rhinitis 04/20/2008   Qualifier: Diagnosis of  By: Larose Kells MD, [place] Vertigo 06/20/2015    Past Surgical History:  Procedure Laterality Date  . AAA repair  2003  . CARDIAC CATHETERIZATION    . CHOLECYSTECTOMY    . INGUINAL  HERNIA REPAIR Right 1985  . INGUINAL HERNIA REPAIR Left 06/30/2018   open; w/mesh  . INGUINAL HERNIA REPAIR Left 06/30/2018   Procedure: OPEN LEFT INGUINAL HERNIA REPAIR;  Surgeon: Fanny Skates, MD;  Location: [place];  Service: General;  Laterality: Left;  . INSERTION OF MESH Left 06/30/2018   Procedure: INSERTION OF MESH;  Surgeon: Fanny Skates, MD;  Location: [place];  Service: General;  Laterality: Left;  Marland Kitchen MASS EXCISION N/A 09/29/2018   Procedure: EXCISION OF 3 CM MASS ANTERIOR CHEST WALL;  Surgeon: Fanny Skates, MD;  Location: [place];  Service: General;  Laterality: N/A;  . PTCA  1989, 1992    Current  Medications: Current Meds  Medication Sig  . acetaminophen (TYLENOL) 500 MG tablet Take 1,000 mg by mouth every 6 (six) hours as needed for mild pain.  Marland Kitchen albuterol (VENTOLIN HFA) 108 (90 Base) MCG/ACT inhaler Inhale 2 puffs into the lungs every 6 (six) hours as needed for wheezing or shortness of breath.  Marland Kitchen aspirin 81 MG chewable tablet Chew 81 mg by mouth daily.   Marland Kitchen azelastine (ASTELIN) 0.1 % nasal spray Place 2 sprays into both nostrils 2 (two) times daily. Use in each nostril as directed  . betamethasone valerate (VALISONE) 0.1 % cream Apply 1 application topically daily.  . Dorzolamide HCl-Timolol Mal PF 22.3-6.8 MG/ML SOLN Place 1 drop into both eyes 2 (two) times daily.   . fenofibrate (TRICOR) 48 MG tablet Take 48 mg by mouth daily.  . fluticasone (FLONASE) 50 MCG/ACT nasal spray Place 1 spray into both nostrils daily.  . furosemide (LASIX) 20 MG tablet Take 1 tablet (20 mg total) by mouth daily.  Marland Kitchen HYDROcodone-acetaminophen (NORCO) 5-325 MG tablet 0.5-1 tablet po bid prn pain  . isosorbide mononitrate (IMDUR) 30 MG 24 hr tablet Take 0.5 tablets (15 mg total) by mouth daily.  Marland Kitchen latanoprost (XALATAN) 0.005 % ophthalmic solution Place 1 drop into the right eye at bedtime.  Marland Kitchen loratadine (CLARITIN) 10 MG tablet Take 1 tablet (10 mg total) by mouth daily.  Marland Kitchen MELATONIN PO Take 3 mg by mouth at bedtime as needed (sleep).   . metoprolol succinate (TOPROL-XL) 50 MG 24 hr tablet Take 1 tablet (50 mg total) by mouth daily. Take with or immediately following a meal.  . nitroGLYCERIN (NITROSTAT) 0.4 MG SL tablet Place 1 tablet (0.4 mg total) under the tongue every 5 (five) minutes as needed for chest pain.  . pantoprazole (PROTONIX) 40 MG tablet Take 1 tablet (40 mg total) by mouth daily.  Marland Kitchen senna-docusate (SENOKOT-S) 8.6-50 MG tablet Take 1 tablet by mouth 2 (two) times daily as needed for moderate constipation.  . simvastatin (ZOCOR) 10 MG tablet Take 1 tablet (10 mg total) by mouth at bedtime.  .  tamsulosin (FLOMAX) 0.4 MG CAPS capsule Take 1 capsule (0.4 mg total) by mouth daily after supper.     Allergies:   Ace inhibitors, Aspirin, and Spironolactone   Social History   Socioeconomic History  . Marital status: Married    Spouse name: Not on file  . Number of children: 3  . Years of education: Not on file  . Highest education level: Not on file  Occupational History  . Occupation: retired     Fish farm manager: RETIRED  Tobacco Use  . Smoking status: Former Research scientist (life sciences)  . Smokeless tobacco: Never Used  . Tobacco comment: quit in 1953  Vaping Use  . Vaping Use: Never used  Substance and Sexual Activity  .  Alcohol use: No  . Drug use: No  . Sexual activity: Not Currently  Other Topics Concern  . Not on file  Social History Narrative   WWII veteran   Independent on his ADL     Lives w/ wife   Social Determinants of Health   Financial Resource Strain:   . Difficulty of Paying Living Expenses: Not on file  Food Insecurity:   . Worried About Charity fundraiser in the Last Year: Not on file  . Ran Out of Food in the Last Year: Not on file  Transportation Needs:   . Lack of Transportation (Medical): Not on file  . Lack of Transportation (Non-Medical): Not on file  Physical Activity:   . Days of Exercise per Week: Not on file  . Minutes of Exercise per Session: Not on file  Stress:   . Feeling of Stress : Not on file  Social Connections:   . Frequency of Communication with Friends and Family: Not on file  . Frequency of Social Gatherings with Friends and Family: Not on file  . Attends Religious Services: Not on file  . Active Member of Clubs or Organizations: Not on file  . Attends Archivist Meetings: Not on file  . Marital Status: Not on file     Family History: The patient's family history includes CAD in an other family member; Kidney Stones in his son; Lung cancer in his father. There is no history of Prostate cancer, Diabetes, or Colon cancer.    ROS:    Please see the history of present illness.    All other systems reviewed and are negative.   EKGs/Labs/Other Studies Reviewed:    The following studies were reviewed today:  Echo 09/17/2020 1. Left ventricular ejection fraction, by estimation, is 35 to 40%. The  left ventricle has moderately decreased function. The left ventricle  demonstrates regional wall motion abnormalities (see scoring  diagram/findings for description). Left ventricular  diastolic parameters are indeterminate. There is hypokinesis of the left  ventricular, entire inferolateral wall. There is hypokinesis of the left  ventricular, basal-mid lateral wall.  2. Right ventricular systolic function is normal. The right ventricular  size is normal.  3. The mitral valve is normal in structure. Trivial mitral valve  regurgitation. No evidence of mitral stenosis.  4. The aortic valve has an indeterminant number of cusps. Aortic valve  regurgitation is mild. Mild to moderate aortic valve  sclerosis/calcification is present, without any evidence of aortic  stenosis.  5. Aortic dilatation noted. There is mild dilatation of the aortic root,  measuring 40 mm. There is mild dilatation of the ascending aorta,  measuring 37 mm.  6. The inferior vena cava is normal in size with greater than 50%  respiratory variability, suggesting right atrial pressure of 3 mmHg.  7. Recommend limited echo with definity for better assessmen of wall  motion as anterior wall not well visualized in apical and parasternal  short axis views.   EKG:  EKG is ordered today.  The ekg ordered today demonstrates sinus rhythm at rate of 91 bpm, PAC, PVC  Recent Labs: 09/16/2020: B Natriuretic Peptide 389.3 09/18/2020: TSH 1.535 09/20/2020: Magnesium 2.0 09/29/2020: ALT 14; BUN 15; Creatinine, Ser 0.85; Hemoglobin 12.9; Platelets 160.0; Potassium 4.1; Sodium 132  Recent Lipid Panel    Component Value Date/Time   CHOL 109 10/28/2019 1056    TRIG 152.0 (H) 10/28/2019 1056   HDL 38.80 (L) 10/28/2019 1056   CHOLHDL 3 10/28/2019  1056   VLDL 30.4 10/28/2019 1056   LDLCALC 39 10/28/2019 1056   LDLDIRECT 57.3 07/29/2014 0820   Physical Exam:    VS:  BP 120/74   Pulse 91   Ht 5\' 7"  (1.702 m)   Wt 137 lb 3.2 oz (62.2 kg)   SpO2 92%   BMI 21.49 kg/m     Wt Readings from Last 3 Encounters:  10/11/20 137 lb 3.2 oz (62.2 kg)  09/29/20 141 lb 8 oz (64.2 kg)  09/22/20 130 lb (59 kg)     GEN: Thin frail male in no acute distress HEENT: Normal NECK: No JVD; No carotid bruits LYMPHATICS: No lymphadenopathy CARDIAC: *RR, no murmurs, rubs, gallops RESPIRATORY:  Clear to auscultation without rales, wheezing or rhonchi  ABDOMEN: Soft, non-tender, non-distended MUSCULOSKELETAL:  No edema; No deformity  SKIN: Warm and dry NEUROLOGIC:  Alert and oriented x 3 PSYCHIATRIC:  Normal affect   ASSESSMENT AND PLAN:    1. CAD s/p p  remote inf MI 1989 tx with PTCA -No chest pain.  Continue aspirin, Imdur, beta-blocker and statin - Pt and wife favors medical therapy if angina, I agreed.   2. Chronic systolic CHF - Echo 14/7829 showed LVEF of 35-40% with WM abnormality and grade 2 DD. No prior echo but EF was 33% by stress test in 2017 (no ischemia but prior MI).  Patient is euvolemic by exam.  No orthopnea, PND or lower extremity edema. -Continue Toprol-XL 50 mg daily -Add low-dose losartan  3. HTN -Blood pressure stable -Continue beta-blocker -Add ARB as above  4. Recent hypoxic respiratory failure 2nd to CAP -Patient is dealing with postnasal drip and sinusitis. -Oxygen level stayed above 90% -He has oxygen at home and using as needed -Has follow-up with pulmonary  Medication Adjustments/Labs and Tests Ordered: Current medicines are reviewed at length with the patient today.  Concerns regarding medicines are outlined above.  Orders Placed This Encounter  Procedures  . EKG 12-Lead   Meds ordered this encounter   Medications  . losartan (COZAAR) 25 MG tablet    Sig: Take 0.5 tablets (12.5 mg total) by mouth daily.    Dispense:  45 tablet    Refill:  3    Patient Instructions  Medication Instructions:  Your physician has recommended you make the following change in your medication:  1.  START Losartan 25 mg taking 1/2 tablet daily   *If you need a refill on your cardiac medications before your next appointment, please call your pharmacy*   Lab Work: None ordered  If you have labs (blood work) drawn today and your tests are completely normal, you will receive your results only by: Marland Kitchen MyChart Message (if you have MyChart) OR . A paper copy in the mail If you have any lab test that is abnormal or we need to change your treatment, we will call you to review the results.   Testing/Procedures: None ordered   Follow-Up: At [facility], you and your health needs are our priority.  As part of our continuing mission to provide you with exceptional heart care, we have created designated Provider Care Teams.  These Care Teams include your primary Cardiologist (physician) and Advanced Practice Providers (APPs -  Physician Assistants and Nurse Practitioners) who all work together to provide you with the care you need, when you need it.  We recommend signing up for the patient portal called "MyChart".  Sign up information is provided on this After Visit Summary.  MyChart is  used to connect with patients for Virtual Visits (Telemedicine).  Patients are able to view lab/test results, encounter notes, upcoming appointments, etc.  Non-urgent messages can be sent to your provider as well.   To learn more about what you can do with MyChart, go to [email or web address].    Your next appointment:   3 month(s)  The format for your next appointment:   In Person  Provider:   You may see Ena Dawley, MD or one of the following Advanced Practice Providers on your designated Care Team:    Melina Copa,  PA-C  Ermalinda Barrios, PA-C    Other Instructions      Signed, Leanor Kail, [place]  10/11/2020 3:39 PM    [place]

## 2020-10-12 ENCOUNTER — Ambulatory Visit: Payer: Medicare Other

## 2020-10-17 ENCOUNTER — Institutional Professional Consult (permissible substitution): Payer: Medicare Other | Admitting: Pulmonary Disease

## 2020-10-18 ENCOUNTER — Telehealth: Payer: Self-pay | Admitting: Internal Medicine

## 2020-10-18 ENCOUNTER — Telehealth: Payer: Self-pay | Admitting: Cardiology

## 2020-10-18 NOTE — Telephone Encounter (Signed)
Please recommend to discontinue to use Imdur and keep [place] posted on symptoms

## 2020-10-18 NOTE — Telephone Encounter (Signed)
Caller Christopher Hogan Call Back # [phone]  [place] called in reference to patient's low blood pressure , patient's blood pressure is 104/58 in right arm and in left is 100/48. Also when entering patient's medication in the system  The nurse noticed there is a reaction between losartan and lasix. Patient prescribed losartan by cardiologist

## 2020-10-18 NOTE — Telephone Encounter (Signed)
Returned call to [facility] but  I now realize however that this issue has been addressed by cardiology. Kaylyn,  please call the patient tomorrow morning and see how he is doing.

## 2020-10-18 NOTE — Telephone Encounter (Signed)
Pt c/o BP issue: STAT if pt c/o blurred vision, one-sided weakness or slurred speech  1. What are your last 5 BP readings?  10/18/20 Right arm 102/58 Left arm 100/58  2. Are you having any other symptoms (ex. Dizziness, headache, blurred vision, passed out)? Dizziness, Oxygen 88-90, and Fatigue   3. What is your BP issue? Patient has been having hypotension since waking up this morning. Please advise.

## 2020-10-18 NOTE — Telephone Encounter (Signed)
Pts Physical Therapist was calling to report to Dr. Meda Coffee and pts PCP Dr. Larose Kells, that the pts blood pressures have been running systolic 168-372 and diastolic high 90S-11D.  Christopher Hogan with PT states the pt does complain with dizziness at times. Christopher Hogan PT states PCP Dr. Larose Kells ordered for the pt to have PT/OT in the home, for recent hospitalization for pneumonia.  Christopher Hogan PT wanted to run this by Dr. Meda Coffee from a cardiac standpoint, to see if she wanted to further advise on any med changes at this time. Christopher Hogan reports pt is currently at home, with no acute complaints.  Advised Christopher Hogan that if the pt has not taken his metoprolol yet today, then he should continue to hold it for the day, hydrate well, and recheck accordingly in the A.M. to see if improved or not. Advised Christopher Hogan that if BP is within-normal-limits tomorrow, then he can resume back with his metoprolol regimen, until other advisement is provided by his PCP or Dr. Meda Coffee.  Informed Christopher Hogan that I will route this message to Dr. Meda Coffee to further review and advise as needed, and follow-up with the pt accordingly thereafter.  Christopher Hogan PT that she should call Dr. Ethel Rana office with this information, for he closely follows the pts PT/OT.  Christopher Hogan verbalized understanding and agrees with this plan.

## 2020-10-19 NOTE — Telephone Encounter (Addendum)
Spoke w/ Christopher Hogan- she informed he is doing "okay", has no appetite and feeling fatigued, BP this morning was 121/71 has not meds yet this morning. Instructed to hold BP meds this morning until she hears back from [place]. Christopher Hogan verbalized understanding.

## 2020-10-19 NOTE — Telephone Encounter (Signed)
Spoke with the patient's wife, today he is not feeling well but he specifically denies chest pain, difficulty breathing, bleeding anywhere. His BP this morning was very good. Plan: Decrease metoprolol 50 mg to only half tablet daily Continue monitoring BPs If he has any specific symptoms such as fever, chills, chest pain, bleeding, difficulty breathing: Seek medical attention.

## 2020-10-20 NOTE — Telephone Encounter (Signed)
Spoke with the pt and wife and endorsed to them per Dr. Meda Coffee, for the pt to stop taking Imdur, and keep [place] posted as needed with his BP readings and symptoms.  Informed the pts wife if she notices his BPs elevating, to call our office and let us know this.  Advised them to continue his metoprolol regimen as Dr. Larose Kells has instructed him to, in the last day.  Wife verbalized understanding and agrees with this plan. Wife states they will stop imdur, continue metoprolol regimen as outlined by Dr. Larose Kells, and they will keep [place] posted as needed.  Wife gracious for all the assistance provided.

## 2020-10-21 ENCOUNTER — Telehealth: Payer: Self-pay | Admitting: Internal Medicine

## 2020-10-21 NOTE — Telephone Encounter (Signed)
BP at the cardiology office 10/11/2020: 120/74. Losartan was added Subsequently BP was low, cardiology recommended to recommended to stop Imdur. I recommended to decrease metoprolol to half tablet  Now patient has developed a low-grade temp, BP 93/53, some cough and sneezing. Please advise patient wife: I cannot tell exactly what is going on without seeing the patient, recommend to stop losartan completely, that may help with blood pressure. However low-grade fever, low blood pressure, respiratory symptoms may indicate a infection such as pneumonia thus I rec ER eval. If unwilling to go, recommend : Drink plenty fluids and take some Tylenol but if no improvement: seek medical attention

## 2020-10-21 NOTE — Telephone Encounter (Signed)
Patient wife is calling stating patient is wondering if Dr. Larose Kells will prescribe an antibiotic. He is having some sneezing, cough, chills blood pressure 93/53 temp 99.3. Patient decline appt wants Dr. Larose Kells opinion

## 2020-10-21 NOTE — Telephone Encounter (Signed)
Spoke to patient's wife. She states they already did decrease the metoprolol to half tablet about two days ago. BP running 85/63, 93/52 at home. I did advise her per your recommendation to stop losartan completely. She agrees and will do so. She declined going to ER tonight as they would have to call someone to drive them. Informed her of alternate options per pcp. She states she will take him to [facility] over the weekend if he remains ill. Will call 911 if sxs worsen.

## 2020-10-22 ENCOUNTER — Other Ambulatory Visit: Payer: Self-pay

## 2020-10-22 ENCOUNTER — Emergency Department (HOSPITAL_BASED_OUTPATIENT_CLINIC_OR_DEPARTMENT_OTHER): Payer: Medicare Other

## 2020-10-22 ENCOUNTER — Inpatient Hospital Stay (HOSPITAL_BASED_OUTPATIENT_CLINIC_OR_DEPARTMENT_OTHER)
Admission: EM | Admit: 2020-10-22 | Discharge: 2020-10-27 | DRG: 177 | Disposition: A | Discharge status: Rehabilitation Facility | Payer: Medicare Other | Source: Other Acute Inpatient Hospital | Attending: Family Medicine | Admitting: Family Medicine

## 2020-10-22 ENCOUNTER — Encounter (HOSPITAL_BASED_OUTPATIENT_CLINIC_OR_DEPARTMENT_OTHER): Payer: Self-pay | Admitting: Emergency Medicine

## 2020-10-22 DIAGNOSIS — R630 Anorexia: Secondary | ICD-10-CM | POA: Diagnosis present

## 2020-10-22 DIAGNOSIS — R5381 Other malaise: Secondary | ICD-10-CM | POA: Diagnosis not present

## 2020-10-22 DIAGNOSIS — Z87891 Personal history of nicotine dependence: Secondary | ICD-10-CM | POA: Diagnosis not present

## 2020-10-22 DIAGNOSIS — U071 COVID-19: Secondary | ICD-10-CM | POA: Diagnosis not present

## 2020-10-22 DIAGNOSIS — Z888 Allergy status to other drugs, medicaments and biological substances status: Secondary | ICD-10-CM | POA: Diagnosis not present

## 2020-10-22 DIAGNOSIS — Z8679 Personal history of other diseases of the circulatory system: Secondary | ICD-10-CM | POA: Diagnosis not present

## 2020-10-22 DIAGNOSIS — J8410 Pulmonary fibrosis, unspecified: Secondary | ICD-10-CM | POA: Diagnosis not present

## 2020-10-22 DIAGNOSIS — J9601 Acute respiratory failure with hypoxia: Secondary | ICD-10-CM | POA: Diagnosis present

## 2020-10-22 DIAGNOSIS — I77819 Aortic ectasia, unspecified site: Secondary | ICD-10-CM | POA: Diagnosis present

## 2020-10-22 DIAGNOSIS — J1282 Pneumonia due to coronavirus disease 2019: Secondary | ICD-10-CM | POA: Diagnosis not present

## 2020-10-22 DIAGNOSIS — J9600 Acute respiratory failure, unspecified whether with hypoxia or hypercapnia: Secondary | ICD-10-CM

## 2020-10-22 DIAGNOSIS — I700 Atherosclerosis of aorta: Secondary | ICD-10-CM | POA: Diagnosis not present

## 2020-10-22 DIAGNOSIS — N400 Enlarged prostate without lower urinary tract symptoms: Secondary | ICD-10-CM | POA: Diagnosis present

## 2020-10-22 DIAGNOSIS — I252 Old myocardial infarction: Secondary | ICD-10-CM | POA: Diagnosis not present

## 2020-10-22 DIAGNOSIS — I2510 Atherosclerotic heart disease of native coronary artery without angina pectoris: Secondary | ICD-10-CM | POA: Diagnosis not present

## 2020-10-22 DIAGNOSIS — D696 Thrombocytopenia, unspecified: Secondary | ICD-10-CM | POA: Diagnosis present

## 2020-10-22 DIAGNOSIS — H353220 Exudative age-related macular degeneration, left eye, stage unspecified: Secondary | ICD-10-CM | POA: Diagnosis present

## 2020-10-22 DIAGNOSIS — Z79899 Other long term (current) drug therapy: Secondary | ICD-10-CM

## 2020-10-22 DIAGNOSIS — R0602 Shortness of breath: Secondary | ICD-10-CM | POA: Diagnosis not present

## 2020-10-22 DIAGNOSIS — I10 Essential (primary) hypertension: Secondary | ICD-10-CM | POA: Diagnosis not present

## 2020-10-22 DIAGNOSIS — Z6820 Body mass index (BMI) 20.0-20.9, adult: Secondary | ICD-10-CM

## 2020-10-22 DIAGNOSIS — I5022 Chronic systolic (congestive) heart failure: Secondary | ICD-10-CM | POA: Diagnosis not present

## 2020-10-22 DIAGNOSIS — H409 Unspecified glaucoma: Secondary | ICD-10-CM | POA: Diagnosis not present

## 2020-10-22 DIAGNOSIS — Z8249 Family history of ischemic heart disease and other diseases of the circulatory system: Secondary | ICD-10-CM

## 2020-10-22 DIAGNOSIS — E785 Hyperlipidemia, unspecified: Secondary | ICD-10-CM | POA: Diagnosis present

## 2020-10-22 DIAGNOSIS — J309 Allergic rhinitis, unspecified: Secondary | ICD-10-CM | POA: Diagnosis present

## 2020-10-22 DIAGNOSIS — R531 Weakness: Secondary | ICD-10-CM | POA: Diagnosis not present

## 2020-10-22 DIAGNOSIS — K219 Gastro-esophageal reflux disease without esophagitis: Secondary | ICD-10-CM | POA: Diagnosis not present

## 2020-10-22 DIAGNOSIS — H8110 Benign paroxysmal vertigo, unspecified ear: Secondary | ICD-10-CM | POA: Diagnosis not present

## 2020-10-22 DIAGNOSIS — I110 Hypertensive heart disease with heart failure: Secondary | ICD-10-CM | POA: Diagnosis present

## 2020-10-22 DIAGNOSIS — Z801 Family history of malignant neoplasm of trachea, bronchus and lung: Secondary | ICD-10-CM

## 2020-10-22 DIAGNOSIS — M2580 Other specified joint disorders, unspecified joint: Secondary | ICD-10-CM | POA: Diagnosis not present

## 2020-10-22 DIAGNOSIS — J9691 Respiratory failure, unspecified with hypoxia: Secondary | ICD-10-CM | POA: Diagnosis not present

## 2020-10-22 DIAGNOSIS — I255 Ischemic cardiomyopathy: Secondary | ICD-10-CM | POA: Diagnosis not present

## 2020-10-22 DIAGNOSIS — M545 Low back pain, unspecified: Secondary | ICD-10-CM | POA: Diagnosis not present

## 2020-10-22 HISTORY — DX: Acute respiratory failure, unspecified whether with hypoxia or hypercapnia: J96.00

## 2020-10-22 HISTORY — DX: COVID-19: U07.1

## 2020-10-22 LAB — BASIC METABOLIC PANEL
Anion gap: 10 (ref 5–15)
BUN: 21 mg/dL (ref 8–23)
CO2: 27 mmol/L (ref 22–32)
Calcium: 8.2 mg/dL — ABNORMAL LOW (ref 8.9–10.3)
Chloride: 94 mmol/L — ABNORMAL LOW (ref 98–111)
Creatinine, Ser: 1.03 mg/dL (ref 0.61–1.24)
GFR, Estimated: >60 mL/min (ref >60)
Glucose, Bld: 100 mg/dL — ABNORMAL HIGH (ref 70–99)
Potassium: 3.9 mmol/L (ref 3.5–5.1)
Sodium: 131 mmol/L — ABNORMAL LOW (ref 135–145)

## 2020-10-22 LAB — CBC WITH DIFFERENTIAL/PLATELET
Abs Immature Granulocytes: 0.03 10*3/uL (ref 0.00–0.07)
Basophils Absolute: 0 10*3/uL (ref 0.0–0.1)
Basophils Relative: 1 %
Eosinophils Absolute: 0 10*3/uL (ref 0.0–0.5)
Eosinophils Relative: 0 %
HCT: 40.8 % (ref 39.0–52.0)
Hemoglobin: 13.6 g/dL (ref 13.0–17.0)
Immature Granulocytes: 1 %
Lymphocytes Relative: 22 %
Lymphs Abs: 0.8 10*3/uL (ref 0.7–4.0)
MCH: 31.6 pg (ref 26.0–34.0)
MCHC: 33.3 g/dL (ref 30.0–36.0)
MCV: 94.7 fL (ref 80.0–100.0)
Monocytes Absolute: 0.6 10*3/uL (ref 0.1–1.0)
Monocytes Relative: 16 %
Neutro Abs: 2.3 10*3/uL (ref 1.7–7.7)
Neutrophils Relative %: 60 %
Platelets: 131 10*3/uL — ABNORMAL LOW (ref 150–400)
RBC: 4.31 MIL/uL (ref 4.22–5.81)
RDW: 14.5 % (ref 11.5–15.5)
WBC: 3.8 10*3/uL — ABNORMAL LOW (ref 4.0–10.5)
nRBC: 0 % (ref 0.0–0.2)

## 2020-10-22 LAB — CBC
HCT: 40.8 % (ref 39.0–52.0)
Hemoglobin: 13.3 g/dL (ref 13.0–17.0)
MCH: 31.7 pg (ref 26.0–34.0)
MCHC: 32.6 g/dL (ref 30.0–36.0)
MCV: 97.4 fL (ref 80.0–100.0)
Platelets: 123 10*3/uL — ABNORMAL LOW (ref 150–400)
RBC: 4.19 MIL/uL — ABNORMAL LOW (ref 4.22–5.81)
RDW: 14.6 % (ref 11.5–15.5)
WBC: 2.9 10*3/uL — ABNORMAL LOW (ref 4.0–10.5)
nRBC: 0 % (ref 0.0–0.2)

## 2020-10-22 LAB — RESP PANEL BY RT-PCR (FLU A&B, COVID) ARPGX2
Influenza A by PCR: NEGATIVE
Influenza B by PCR: NEGATIVE
SARS Coronavirus 2 by RT PCR: POSITIVE — AB

## 2020-10-22 LAB — CREATININE, SERUM
Creatinine, Ser: 0.92 mg/dL (ref 0.61–1.24)
GFR, Estimated: >60 mL/min (ref >60)

## 2020-10-22 LAB — TROPONIN I (HIGH SENSITIVITY): Troponin I (High Sensitivity): 11 ng/L (ref <18)

## 2020-10-22 LAB — C-REACTIVE PROTEIN: CRP: 4.7 mg/dL — ABNORMAL HIGH (ref <1.0)

## 2020-10-22 LAB — BRAIN NATRIURETIC PEPTIDE: B Natriuretic Peptide: 94.4 pg/mL (ref 0.0–100.0)

## 2020-10-22 LAB — FERRITIN: Ferritin: 387 ng/mL — ABNORMAL HIGH (ref 24–336)

## 2020-10-22 LAB — LACTATE DEHYDROGENASE: LDH: 205 U/L — ABNORMAL HIGH (ref 98–192)

## 2020-10-22 LAB — PROCALCITONIN: Procalcitonin: <0.1 ng/mL

## 2020-10-22 LAB — D-DIMER, QUANTITATIVE: D-Dimer, Quant: 3.04 ug/mL-FEU — ABNORMAL HIGH (ref 0.00–0.50)

## 2020-10-22 MED ORDER — MELATONIN 3 MG PO TABS
3.0000 mg | ORAL_TABLET | Freq: Every evening | ORAL | Status: DC | PRN
  Administered 2020-10-22: 3 mg via ORAL
  Filled 2020-10-22 (×2): qty 1

## 2020-10-22 MED ORDER — SIMVASTATIN 10 MG PO TABS
10.0000 mg | ORAL_TABLET | Freq: Every day | ORAL | Status: DC
  Administered 2020-10-22 – 2020-10-26 (×5): 10 mg via ORAL
  Filled 2020-10-22 (×5): qty 1

## 2020-10-22 MED ORDER — SODIUM CHLORIDE 0.9% FLUSH: 3.0000 mL | INTRAVENOUS | Status: DC | PRN

## 2020-10-22 MED ORDER — LORATADINE 10 MG PO TABS
10.0000 mg | ORAL_TABLET | Freq: Every day | ORAL | Status: DC
  Administered 2020-10-23 – 2020-10-27 (×5): 10 mg via ORAL
  Filled 2020-10-22 (×5): qty 1

## 2020-10-22 MED ORDER — LATANOPROST 0.005 % OP SOLN
1.0000 [drp] | Freq: Every day | OPHTHALMIC | Status: DC
  Administered 2020-10-22 – 2020-10-26 (×5): 1 [drp] via OPHTHALMIC
  Filled 2020-10-22 (×2): qty 2.5

## 2020-10-22 MED ORDER — ALBUTEROL SULFATE HFA 108 (90 BASE) MCG/ACT IN AERS: 2.0000 | INHALATION_SPRAY | Freq: Four times a day (QID) | RESPIRATORY_TRACT | Status: DC | PRN

## 2020-10-22 MED ORDER — SODIUM CHLORIDE 0.9 % IV SOLN
100.0000 mg | Freq: Every day | INTRAVENOUS | Status: AC
  Administered 2020-10-23 – 2020-10-26 (×4): 100 mg via INTRAVENOUS
  Filled 2020-10-22 (×5): qty 20

## 2020-10-22 MED ORDER — PANTOPRAZOLE SODIUM 40 MG PO TBEC
40.0000 mg | DELAYED_RELEASE_TABLET | Freq: Every day | ORAL | Status: DC
  Administered 2020-10-23 – 2020-10-27 (×5): 40 mg via ORAL
  Filled 2020-10-22 (×5): qty 1

## 2020-10-22 MED ORDER — SODIUM CHLORIDE 0.9 % IV SOLN: 250.0000 mL | INTRAVENOUS | Status: DC | PRN

## 2020-10-22 MED ORDER — GUAIFENESIN-DM 100-10 MG/5ML PO SYRP
10.0000 mL | ORAL_SOLUTION | ORAL | Status: DC | PRN
  Filled 2020-10-22: qty 10

## 2020-10-22 MED ORDER — LOSARTAN POTASSIUM 25 MG PO TABS: 12.5000 mg | ORAL_TABLET | Freq: Every day | ORAL | Status: DC

## 2020-10-22 MED ORDER — ASCORBIC ACID 500 MG PO TABS
1000.0000 mg | ORAL_TABLET | Freq: Three times a day (TID) | ORAL | Status: DC
  Administered 2020-10-22 – 2020-10-27 (×15): 1000 mg via ORAL
  Filled 2020-10-22 (×15): qty 2

## 2020-10-22 MED ORDER — ZINC SULFATE 220 (50 ZN) MG PO CAPS
220.0000 mg | ORAL_CAPSULE | Freq: Every day | ORAL | Status: DC
  Administered 2020-10-22 – 2020-10-27 (×6): 220 mg via ORAL
  Filled 2020-10-22 (×6): qty 1

## 2020-10-22 MED ORDER — ACETAMINOPHEN 500 MG PO TABS: 1000.0000 mg | ORAL_TABLET | Freq: Four times a day (QID) | ORAL | Status: DC | PRN

## 2020-10-22 MED ORDER — TAMSULOSIN HCL 0.4 MG PO CAPS
0.4000 mg | ORAL_CAPSULE | Freq: Every day | ORAL | Status: DC
  Administered 2020-10-23 – 2020-10-27 (×5): 0.4 mg via ORAL
  Filled 2020-10-22 (×5): qty 1

## 2020-10-22 MED ORDER — SENNOSIDES-DOCUSATE SODIUM 8.6-50 MG PO TABS
1.0000 | ORAL_TABLET | Freq: Two times a day (BID) | ORAL | Status: DC | PRN
  Filled 2020-10-22: qty 1

## 2020-10-22 MED ORDER — METOPROLOL SUCCINATE ER 25 MG PO TB24: 25.0000 mg | ORAL_TABLET | Freq: Every day | ORAL | Status: DC

## 2020-10-22 MED ORDER — ACETAMINOPHEN 325 MG PO TABS
650.0000 mg | ORAL_TABLET | Freq: Four times a day (QID) | ORAL | Status: DC | PRN
  Administered 2020-10-27: 650 mg via ORAL
  Filled 2020-10-22: qty 2

## 2020-10-22 MED ORDER — SODIUM CHLORIDE 0.9 % IV SOLN
INTRAVENOUS | Status: DC | PRN
  Administered 2020-10-22: 250 mL via INTRAVENOUS

## 2020-10-22 MED ORDER — SODIUM CHLORIDE 0.9% FLUSH
3.0000 mL | Freq: Two times a day (BID) | INTRAVENOUS | Status: DC
  Administered 2020-10-22 – 2020-10-27 (×8): 3 mL via INTRAVENOUS

## 2020-10-22 MED ORDER — LOSARTAN POTASSIUM 25 MG PO TABS
12.5000 mg | ORAL_TABLET | Freq: Every day | ORAL | Status: DC
  Administered 2020-10-23 – 2020-10-27 (×5): 12.5 mg via ORAL
  Filled 2020-10-22 (×5): qty 1

## 2020-10-22 MED ORDER — SODIUM CHLORIDE 0.9 % IV SOLN
100.0000 mg | Freq: Once | INTRAVENOUS | Status: AC
  Administered 2020-10-22: 100 mg via INTRAVENOUS
  Filled 2020-10-22: qty 20

## 2020-10-22 MED ORDER — FUROSEMIDE 20 MG PO TABS
20.0000 mg | ORAL_TABLET | Freq: Every day | ORAL | Status: DC
  Administered 2020-10-23 – 2020-10-27 (×5): 20 mg via ORAL
  Filled 2020-10-22 (×5): qty 1

## 2020-10-22 MED ORDER — IOHEXOL 350 MG/ML SOLN
100.0000 mL | Freq: Once | INTRAVENOUS | Status: AC | PRN
  Administered 2020-10-22: 75 mL via INTRAVENOUS

## 2020-10-22 MED ORDER — ENOXAPARIN SODIUM 40 MG/0.4ML ~~LOC~~ SOLN
40.0000 mg | SUBCUTANEOUS | Status: DC
  Administered 2020-10-22: 40 mg via SUBCUTANEOUS
  Filled 2020-10-22: qty 0.4

## 2020-10-22 MED ORDER — METOPROLOL SUCCINATE ER 25 MG PO TB24
25.0000 mg | ORAL_TABLET | Freq: Every day | ORAL | Status: DC
  Administered 2020-10-23 – 2020-10-27 (×5): 25 mg via ORAL
  Filled 2020-10-22 (×5): qty 1

## 2020-10-22 MED ORDER — METHYLPREDNISOLONE SODIUM SUCC 125 MG IJ SOLR
60.0000 mg | Freq: Once | INTRAMUSCULAR | Status: AC
  Administered 2020-10-22: 60 mg via INTRAVENOUS
  Filled 2020-10-22: qty 2

## 2020-10-22 MED ORDER — DORZOLAMIDE HCL-TIMOLOL MAL PF 22.3-6.8 MG/ML OP SOLN: 1.0000 [drp] | Freq: Two times a day (BID) | OPHTHALMIC | Status: DC

## 2020-10-22 MED ORDER — PANTOPRAZOLE SODIUM 40 MG PO TBEC: 40.0000 mg | DELAYED_RELEASE_TABLET | Freq: Every day | ORAL | Status: DC

## 2020-10-22 MED ORDER — DORZOLAMIDE HCL-TIMOLOL MAL 2-0.5 % OP SOLN
1.0000 [drp] | Freq: Two times a day (BID) | OPHTHALMIC | Status: DC
  Administered 2020-10-23 – 2020-10-27 (×8): 1 [drp] via OPHTHALMIC
  Filled 2020-10-22 (×2): qty 10

## 2020-10-22 NOTE — ED Notes (Signed)
ED Provider at bedside. 

## 2020-10-22 NOTE — Progress Notes (Addendum)
Called patients' wife to let her know he has arrived and what room he is in.

## 2020-10-22 NOTE — ED Notes (Signed)
Assisted pt to stand to use urinal.

## 2020-10-22 NOTE — ED Notes (Signed)
Pt sts his son is being tested for Covid today s/t cold-like sxs; sts he was around him on Saturday; pt fully vaccinated 2 months ago

## 2020-10-22 NOTE — ED Provider Notes (Signed)
[facility] EMERGENCY DEPARTMENT Provider Note   CSN: [id number] Arrival date & time: 10/22/20  1226     History Chief Complaint  Patient presents with  . Shortness of Breath    Christopher Hogan is a 86 y.o. male w/ hx of AAA s/p repair, CAD, CHF (EF 35-40% on echo 09/18/20, hypokineses of LV and inferolateral wall, aortic dilation to 40 mm), presenting to ED with cough and fatigue.  He reports he been doing well at home, where he lives with his wife, since discharge from the hospital at the end of October.  However beginning 4 days ago, he has noticed he has had a productive cough.  He feels generally weak and short of breath.  He denies fevers or chills.  He feels like he has low energy.  He denies any nausea vomiting or diarrhea, but reports poor appetite.  He lives at home with his wife, and has home PT/OT.  Per medical record review, the patient was discharged from the hospital on September 20, 2020 approximately 1 month ago, after hospitalization for multifocal pneumonia.  This was likely felt to be viral in nature.  He had 2 - Covid and influenza swabs on the hospital.  She was given empiric antibiotics initially with azithromycin and Zosyn for community pneumonia, which was subsequently discontinued as this was not felt to be bacterial.  He was noted to have some desaturations to the low 80s while ambulating, and was discharged home with oxygen as needed.  Patient reports to me that he uses 2 to 4 L as needed at home.  He was also started on low-dose Lasix and switch to 20 mg daily, which the patient says he has been compliant with, given his history of systolic congestive heart failure.  He was discharged home with PT OT.  He has received 2 doses of the moderna covid vaccine in Feb, March 2021  HPI     Past Medical History:  Diagnosis Date  . AAA (abdominal aortic aneurysm) ([place])    repaired 02/2002, renal ultrasound, Sept 2010: normal abdominal aorta, normal renal arteries    . Age-related macular degeneration, wet, left eye ([employer])   . Allergic rhinitis   . Arthritis   . At risk for pulmonary fibrosis 06/25/2018  . CAD (coronary artery disease)    stable stress test 7/11  . CHF (congestive heart failure) ([place])   . Chronic systolic CHF (congestive heart failure) ([place]) 12/17/2013  . Coronary atherosclerosis 06/09/2007   H/O inferior wall MI- PTCA- 1989 Last stress test 2/17- inferior scar, no ischemia    . DEGENERATIVE JOINT DISEASE 03/17/2010   Qualifier: Diagnosis of  By: Dawson Bills    . Dyslipidemia 05/06/2007   LDL 59 Jan 2019     . Eczema 07/09/2013  . ED (erectile dysfunction)   . Epidermoid cyst of skin of chest 09/29/2018  . ERECTILE DYSFUNCTION 12/09/2007   Qualifier: Diagnosis of  By: Larose Kells MD, [place] hypertension 04/30/2007   Qualifier: Diagnosis of  By: Larose Kells MD, [place] GERD 07/21/2008   Qualifier: Diagnosis of  By: Larose Kells MD, [place] GERD (gastroesophageal reflux disease)   . Glaucoma and macular degeneration 12/31/2011  . Hyperlipidemia   . Hypertension   . Inguinal hernia 12/31/2011  . Insomnia 02/22/2018  . Ischemic cardiomyopathy 06/23/2018   Last EF 30-44% by Myoview Feb 2017  . Left inguinal hernia 06/30/2018  . Myocardial infarction (  [place]) 1989   interior wall infarction  . PCP notes >>>>>>>>>>>>>>>>>>>> 07/17/2016  . Pneumonia 2019  . Pre-operative cardiovascular examination 12/31/2011  . Rhinitis 04/20/2008   Qualifier: Diagnosis of  By: Larose Kells MD, [place] Vertigo 06/20/2015    Patient Active Problem List   Diagnosis Date Noted  . COVID-19 virus infection 10/22/2020  . Respiratory failure with hypoxia ([place]) 09/16/2020  . Chronic bilateral low back pain without sciatica 09/03/2020  . Pulmonary fibrosis ([place]) 10/29/2019  . Epidermoid cyst of skin of chest 09/29/2018  . Left inguinal hernia 06/30/2018  . At risk for pulmonary fibrosis 06/25/2018  . Ischemic cardiomyopathy 06/23/2018  . Insomnia 02/22/2018  . PCP notes  >>>>>>>>>>>>>>>>>>>> 07/17/2016  . Vertigo 06/20/2015  . Chronic systolic CHF (congestive heart failure) ([place]) 12/17/2013  . Eczema 07/09/2013  . Annual physical exam 12/31/2011  . Inguinal hernia 12/31/2011  . Glaucoma and macular degeneration 12/31/2011  . DEGENERATIVE JOINT DISEASE 03/17/2010  . GERD 07/21/2008  . Rhinitis 04/20/2008  . ERECTILE DYSFUNCTION 12/09/2007  . Coronary atherosclerosis 06/09/2007  . Dyslipidemia 05/06/2007  . Essential hypertension 04/30/2007    Past Surgical History:  Procedure Laterality Date  . AAA repair  2003  . CARDIAC CATHETERIZATION    . CHOLECYSTECTOMY    . INGUINAL HERNIA REPAIR Right 1985  . INGUINAL HERNIA REPAIR Left 06/30/2018   open; w/mesh  . INGUINAL HERNIA REPAIR Left 06/30/2018   Procedure: OPEN LEFT INGUINAL HERNIA REPAIR;  Surgeon: Fanny Skates, MD;  Location: [facility];  Service: General;  Laterality: Left;  . INSERTION OF MESH Left 06/30/2018   Procedure: INSERTION OF MESH;  Surgeon: Fanny Skates, MD;  Location: [facility];  Service: General;  Laterality: Left;  Marland Kitchen MASS EXCISION N/A 09/29/2018   Procedure: EXCISION OF 3 CM MASS ANTERIOR CHEST WALL;  Surgeon: Fanny Skates, MD;  Location: [facility];  Service: General;  Laterality: N/A;  . PTCA  1989, 1992       Family History  Problem Relation Age of Onset  . Lung cancer Father   . CAD Other        mother?  . Kidney Stones Son   . Prostate cancer Neg Hx   . Diabetes Neg Hx   . Colon cancer Neg Hx     Social History   Tobacco Use  . Smoking status: Former Research scientist (life sciences)  . Smokeless tobacco: Never Used  . Tobacco comment: quit in 1953  Vaping Use  . Vaping Use: Never used  Substance Use Topics  . Alcohol use: No  . Drug use: No    Home Medications Prior to Admission medications   Medication Sig Start Date End Date Taking? Authorizing Provider  acetaminophen (TYLENOL) 500 MG tablet Take 1,000 mg by mouth every 6 (six) hours as needed for mild pain.     [provider]  albuterol (VENTOLIN HFA) 108 (90 Base) MCG/ACT inhaler Inhale 2 puffs into the lungs every 6 (six) hours as needed for wheezing or shortness of breath. 06/13/20   Colon Branch, MD  Dorzolamide HCl-Timolol Mal PF 22.3-6.8 MG/ML SOLN Place 1 drop into both eyes 2 (two) times daily.     [provider]  fenofibrate (TRICOR) 48 MG tablet Take 48 mg by mouth daily.    [provider]  fluticasone (FLONASE) 50 MCG/ACT nasal spray Place 1 spray into both nostrils daily. 09/20/20   Mercy Riding, MD  furosemide (LASIX) 20 MG tablet Take 1 tablet (20  mg total) by mouth daily. 09/20/20   Mercy Riding, MD  HYDROcodone-acetaminophen (NORCO) 5-325 MG tablet 0.5-1 tablet po bid prn pain 09/07/20   Colon Branch, MD  latanoprost (XALATAN) 0.005 % ophthalmic solution Place 1 drop into the right eye at bedtime.    [provider]  loratadine (CLARITIN) 10 MG tablet Take 1 tablet (10 mg total) by mouth daily. 09/20/20   Mercy Riding, MD  losartan (COZAAR) 25 MG tablet Take 0.5 tablets (12.5 mg total) by mouth daily. 10/11/20 01/09/21  Bhagat, Crista Luria, PA  MELATONIN PO Take 3 mg by mouth at bedtime as needed (sleep).     [provider]  metoprolol succinate (TOPROL-XL) 50 MG 24 hr tablet Take 0.5 tablets (25 mg total) by mouth daily. Take with or immediately following a meal. 10/19/20   Colon Branch, MD  nitroGLYCERIN (NITROSTAT) 0.4 MG SL tablet Place 1 tablet (0.4 mg total) under the tongue every 5 (five) minutes as needed for chest pain. 09/14/20   Richardson Dopp T, PA-C  pantoprazole (PROTONIX) 40 MG tablet Take 1 tablet (40 mg total) by mouth daily. 08/31/20   Colon Branch, MD  senna-docusate (SENOKOT-S) 8.6-50 MG tablet Take 1 tablet by mouth 2 (two) times daily as needed for moderate constipation. 09/20/20   Mercy Riding, MD  simvastatin (ZOCOR) 10 MG tablet Take 1 tablet (10 mg total) by mouth at bedtime. 05/14/12   Colon Branch, MD  tamsulosin (FLOMAX) 0.4  MG CAPS capsule Take 1 capsule (0.4 mg total) by mouth daily after supper. 08/31/20   Colon Branch, MD    Allergies    Ace inhibitors, Aspirin, and Spironolactone  Review of Systems   Review of Systems  Constitutional: Positive for appetite change and fatigue. Negative for chills and fever.  HENT: Negative for ear pain and sore throat.   Eyes: Negative for pain and visual disturbance.  Respiratory: Positive for cough and shortness of breath.   Cardiovascular: Negative for chest pain and palpitations.  Gastrointestinal: Negative for abdominal pain, nausea and vomiting.  Genitourinary: Negative for dysuria and hematuria.  Musculoskeletal: Negative for arthralgias and back pain.  Skin: Negative for color change and rash.  Neurological: Negative for syncope and light-headedness.  Psychiatric/Behavioral: Negative for agitation and confusion.  All other systems reviewed and are negative.   Physical Exam Updated Vital Signs BP (!) 126/110 (BP Location: Right Arm)   Pulse 78   Temp 98.1 F (36.7 C) (Oral)   Resp 16   Ht 5\' 7"  (1.702 m)   Wt 59 kg   SpO2 100%   BMI 20.36 kg/m   Physical Exam Vitals and nursing note reviewed.  Constitutional:      Appearance: He is well-developed.  HENT:     Head: Normocephalic and atraumatic.  Eyes:     Conjunctiva/sclera: Conjunctivae normal.  Cardiovascular:     Rate and Rhythm: Normal rate and regular rhythm.  Pulmonary:     Effort: Pulmonary effort is normal. No respiratory distress.     Comments: 96% on 2L [place] in left lower lung field Abdominal:     Palpations: Abdomen is soft.     Tenderness: There is no abdominal tenderness.  Musculoskeletal:     Cervical back: Neck supple.  Skin:    General: Skin is warm and dry.  Neurological:     General: No focal deficit present.     Mental Status: He is alert and oriented to person, place,  and time.     ED Results / Procedures / Treatments   Labs (all labs ordered are listed, but  only abnormal results are displayed) Labs Reviewed  RESP PANEL BY RT-PCR (FLU A&B, COVID) ARPGX2 - Abnormal; Notable for the following components:      Result Value   SARS Coronavirus 2 by RT PCR POSITIVE (*)    All other components within normal limits  BASIC METABOLIC PANEL - Abnormal; Notable for the following components:   Sodium 131 (*)    Chloride 94 (*)    Glucose, Bld 100 (*)    Calcium 8.2 (*)    All other components within normal limits  CBC WITH DIFFERENTIAL/PLATELET - Abnormal; Notable for the following components:   WBC 3.8 (*)    Platelets 131 (*)    All other components within normal limits  D-DIMER, QUANTITATIVE (NOT AT [facility]) - Abnormal; Notable for the following components:   D-Dimer, Quant 3.04 (*)    All other components within normal limits  BRAIN NATRIURETIC PEPTIDE  C-REACTIVE PROTEIN  TROPONIN I (HIGH SENSITIVITY)    EKG EKG Interpretation  Date/Time:  Saturday October 22 2020 12:30:33 EST Ventricular Rate:  83 PR Interval:    QRS Duration: 119 QT Interval:  378 QTC Calculation: 445 R Axis:   49 Text Interpretation: Sinus rhythm Multiple premature complexes, vent & supraven Nonspecific intraventricular conduction delay Inferolateral infarct, old No significant change from Sep 16 2020 ECG, same T wave inversion pattern No STEMI Confirmed by Octaviano Glow [phone]) on 10/22/2020 1:50:45 PM   Radiology CT Angio Chest PE W and/or Wo Contrast  Result Date: 10/22/2020 CLINICAL DATA:  Hospitalized for multifocal pneumonia 1 month ago. Returned today with productive cough and shortness of breath. EXAM: CT ANGIOGRAPHY CHEST WITH CONTRAST TECHNIQUE: Multidetector CT imaging of the chest was performed using the standard protocol during bolus administration of intravenous contrast. Multiplanar CT image reconstructions and MIPs were obtained to evaluate the vascular anatomy. CONTRAST:  82mL OMNIPAQUE IOHEXOL 350 MG/ML SOLN COMPARISON:  09/16/2020 FINDINGS:  Cardiovascular: Study somewhat degraded by respiratory motion. Pulmonary arteries are well opacified. There is no evidence of a pulmonary embolism. Heart is mildly enlarged. Left coronary artery calcifications. No pericardial effusion. Great vessels are normal in caliber. There are aortic atherosclerotic calcifications. Mediastinum/Nodes: No enlarged mediastinal, hilar, or axillary lymph nodes. Thyroid gland, trachea, and esophagus demonstrate no significant findings. Lungs/Pleura: Bilateral ground-glass lung opacities are superimposed on chronic interstitial thickening. Mild lower lobe bronchiectasis. No lung mass or suspicious nodule. No pleural effusion or pneumothorax. Upper Abdomen: No acute abnormality. Musculoskeletal: No fracture or acute finding. No osteoblastic or osteolytic lesions. Review of the MIP images confirms the above findings. IMPRESSION: 1. No evidence of a pulmonary embolism. 2. Ground-glass lung opacities superimposed on chronic interstitial fibrotic changes. Suspect multifocal pneumonia versus is inflammation related to interstitial lung disease. 3. Aortic atherosclerosis. Aortic Atherosclerosis (ICD10-I70.0). Electronically Signed   By: Lajean Manes M.D.   On: 10/22/2020 15:48   DG Chest Portable 1 View  Result Date: 10/22/2020 CLINICAL DATA:  Shortness of breath EXAM: PORTABLE CHEST 1 VIEW COMPARISON:  09/16/2020 FINDINGS: Cardiac shadow is enlarged. Aortic calcifications are noted. Bibasilar fibrotic changes are again noted stable from the prior exam. No focal infiltrate or effusion is seen. No bony abnormality is noted. IMPRESSION: No acute abnormality noted. Electronically Signed   By: Inez Catalina M.D.   On: 10/22/2020 13:56    Procedures Procedures (including critical care time)  Medications Ordered in  ED Medications  simvastatin (ZOCOR) tablet 10 mg (has no administration in time range)  acetaminophen (TYLENOL) tablet 1,000 mg (has no administration in time range)   albuterol (VENTOLIN HFA) 108 (90 Base) MCG/ACT inhaler 2 puff (has no administration in time range)  furosemide (LASIX) tablet 20 mg (has no administration in time range)  latanoprost (XALATAN) 0.005 % ophthalmic solution 1 drop (has no administration in time range)  loratadine (CLARITIN) tablet 10 mg (has no administration in time range)  melatonin tablet 3 mg (has no administration in time range)  metoprolol succinate (TOPROL-XL) 24 hr tablet 25 mg (has no administration in time range)  senna-docusate (Senokot-S) tablet 1 tablet (has no administration in time range)  tamsulosin (FLOMAX) capsule 0.4 mg (has no administration in time range)  losartan (COZAAR) tablet 12.5 mg (has no administration in time range)  pantoprazole (PROTONIX) EC tablet 40 mg (has no administration in time range)  remdesivir 100 mg in sodium chloride 0.9 % 100 mL IVPB (100 mg Intravenous New Bag/Given 10/22/20 1648)    Followed by  remdesivir 100 mg in sodium chloride 0.9 % 100 mL IVPB (has no administration in time range)    Followed by  remdesivir 100 mg in sodium chloride 0.9 % 100 mL IVPB (has no administration in time range)  0.9 %  sodium chloride infusion (250 mLs Intravenous New Bag/Given 10/22/20 1641)  dorzolamide-timolol (COSOPT) 22.3-6.8 MG/ML ophthalmic solution 1 drop (has no administration in time range)  iohexol (OMNIPAQUE) 350 MG/ML injection 100 mL (75 mLs Intravenous Contrast Given 10/22/20 1520)  methylPREDNISolone sodium succinate (SOLU-MEDROL) 125 mg/2 mL injection 60 mg (60 mg Intravenous Given 10/22/20 1641)    ED Course  I have reviewed the triage vital signs and the nursing notes.  Pertinent labs & imaging results that were available during my care of the patient were reviewed by me and considered in my medical decision making (see chart for details).  This patient complains of dyspnea/SOB/cough.  This involves an extensive number of treatment options, and is a complaint that carries with it  a high risk of complications and morbidity.  The differential diagnosis includes new bacterial PNA vs PE vs atypical ACS vs symptomatic anemia vs other  I ordered, reviewed, and interpreted labs, which included Trop 11, BMP unremarkable, Hgb 13.6, WBC 3.8 Covid positive.  Flu negative BNP 94. UA pending I ordered imaging studies which included dg chest and CT PE  I independently visualized and interpreted imaging which showed no acute effusion on xray chest - pending CT PE  AUGUSTA HILBERT was evaluated in Emergency Department on 10/22/2020 for the symptoms described in the history of present illness. He was evaluated in the context of the global COVID-19 pandemic, which necessitated consideration that the patient might be at risk for infection with the SARS-CoV-2 virus that causes COVID-19. Institutional protocols and algorithms that pertain to the evaluation of patients at risk for COVID-19 are in a state of rapid change based on information released by regulatory bodies including the CDC and federal and state organizations. These policies and algorithms were followed during the patient's care in the ED.  After the interventions stated above, I reevaluated the patient and found his O2 requirement was stable, no worsening hypoxia.   Clinical Course as of Oct 22 1901  Sat Oct 22, 2020  1521 Covid positive.  Pt at CT now.  Given his age and weakness, plan for hospital admission.  His clinical condition may worsen and he may need  SNF placement.   [MT]  10 Pt and wife updated.  Admitted to hospitalist who recommended 60 mg IV solumedrol and remdesivir (given he is on oxygen here)   [MT]    Clinical Course User Index [MT] Shandon Matson, Carola Rhine, MD   Final Clinical Impression(s) / ED Diagnoses Final diagnoses:  COVID-19    Rx / DC Orders ED Discharge Orders    None       Jes Costales, Carola Rhine, MD 10/22/20 1902

## 2020-10-22 NOTE — ED Triage Notes (Signed)
Pt sts he uses his home O2 about twice daily; he did not have it on this morning and felt exhausted after walking to the bathroom; per EMS pt 74% on RA; on arrival to ED O2 dc'd, sats 91%; currently on 2L [place] with sats 98%

## 2020-10-22 NOTE — H&P (Addendum)
History and Physical    CHRISTOPHERJOHN SCHIELE MWU:[id number] DOB: 05-Dec-1924 DOA: 10/22/2020  PCP: Colon Branch, MD   Patient coming from: [facility] ER.   I have personally briefly reviewed patient's old medical records in [place]  Chief Complaint: weakness and cough  HPI: Christopher Hogan is a 86 y.o. male with medical history significant of systolic CHF, HTN, CAD, GERD, pulmonary fibrosis, hyperlipidemia, and recent hospital admit on 09/16/20 for respiratory failure with hypoxia. Thought was covid even though covid test negative. Multifocal viral pneumonia and sent home on prn oxygen. Was doing well at home until Tuesday of this past week.  He went to the ER because "I was so weak and had no strength." He states symptoms started Tuesday and he felt bad all week. He had a cough when he would walk and states this was productive at times. He was also short of breath at home. He has been on oxygen for about 2-3 hours/day since his discharge for pneumonia back in October of this year. He has not required more oxygen then normal over this past week. He denies any chest pain or pressure, tightness or shortness of breath/wheezing while sitting. He states if he gets up and walks he is short of breath with coughing. He denies any abdominal pain, diarrhea, nausea/vomiting. He denies any swelling in his legs. He denies any fever/chills.  He denies sick contacts.   He is fully vaccinated with moderna 01/01/20 and 01/28/20. Has not had his booster shot. Also has had his flu shot this year.   ED Course: arrived to [facility] with cough and fatigue x 4 days. On arrival his vitals were stable in ER with BP of 126/110, HR: 78, afebrile, RR of 16 and oxygen of 100% on 2L [place].  covid + test with elevated d-dimer to 3.04. flu negative. BNP was 94. CXR was unremarkable and his CTA showed on PE, but ground glass opacities superimposed on chronic interstitial fibrotic changes. Suspect multifocal  pneumonia vs. Inflammation related to interstitial lung disease. Aortic atherosclerosis. He was admitted and given 60mg  IV solumedrol and remdesivir due to oxygen requirement. Transferred to [facility] for admit.   Review of Systems: As per HPI otherwise all other systems reviewed and are negative.  Past Medical History:  Diagnosis Date  . AAA (abdominal aortic aneurysm) ([place])    repaired 02/2002, renal ultrasound, Sept 2010: normal abdominal aorta, normal renal arteries   . Age-related macular degeneration, wet, left eye ([place])   . Allergic rhinitis   . Arthritis   . At risk for pulmonary fibrosis 06/25/2018  . CAD (coronary artery disease)    stable stress test 7/11  . CHF (congestive heart failure) ([place])   . Chronic systolic CHF (congestive heart failure) ([place]) 12/17/2013  . Coronary atherosclerosis 06/09/2007   H/O inferior wall MI- PTCA- 1989 Last stress test 2/17- inferior scar, no ischemia    . DEGENERATIVE JOINT DISEASE 03/17/2010   Qualifier: Diagnosis of  By: Dawson Bills    . Dyslipidemia 05/06/2007   LDL 59 Jan 2019     . Eczema 07/09/2013  . ED (erectile dysfunction)   . Epidermoid cyst of skin of chest 09/29/2018  . ERECTILE DYSFUNCTION 12/09/2007   Qualifier: Diagnosis of  By: Larose Kells MD, [place] hypertension 04/30/2007   Qualifier: Diagnosis of  By: Larose Kells MD, [place] GERD 07/21/2008   Qualifier: Diagnosis of  By: Larose Kells MD,  [place] GERD (gastroesophageal reflux disease)   . Glaucoma and macular degeneration 12/31/2011  . Hyperlipidemia   . Hypertension   . Inguinal hernia 12/31/2011  . Insomnia 02/22/2018  . Ischemic cardiomyopathy 06/23/2018   Last EF 30-44% by Myov[facility] Feb 2017  . Left inguinal hernia 06/30/2018  . Myocardial infarction ([place]) 1989   interior wall infarction  . PCP notes >>>>>>>>>>>>>>>>>>>> 07/17/2016  . Pneumonia 2019  . Pre-operative cardiovascular examination 12/31/2011  . Rhinitis 04/20/2008   Qualifier: Diagnosis of  By: Larose Kells MD, [place] Vertigo 06/20/2015    Past Surgical History:  Procedure Laterality Date  . AAA repair  2003  . CARDIAC CATHETERIZATION    . CHOLECYSTECTOMY    . INGUINAL HERNIA REPAIR Right 1985  . INGUINAL HERNIA REPAIR Left 06/30/2018   open; w/mesh  . INGUINAL HERNIA REPAIR Left 06/30/2018   Procedure: OPEN LEFT INGUINAL HERNIA REPAIR;  Surgeon: Fanny Skates, MD;  Location: [facility];  Service: General;  Laterality: Left;  . INSERTION OF MESH Left 06/30/2018   Procedure: INSERTION OF MESH;  Surgeon: Fanny Skates, MD;  Location: [facility];  Service: General;  Laterality: Left;  [place] MASS EXCISION N/A 09/29/2018   Procedure: EXCISION OF 3 CM MASS ANTERIOR CHEST WALL;  Surgeon: Fanny Skates, MD;  Location: [facility];  Service: General;  Laterality: N/A;  . [place] History  reports that he has quit smoking. He has never used smokeless tobacco. He reports that he does not drink alcohol and does not use drugs.  Allergies  Allergen Reactions  . Ace Inhibitors Cough  . Aspirin Nausea Only and Other (See Comments)    Full strength only  . Spironolactone Rash    Family History  Problem Relation Age of Onset  . Lung cancer Father   . CAD Other        mother?  . Kidney Stones Son   . Prostate cancer Neg Hx   . Diabetes Neg Hx   . Colon cancer Neg Hx      Prior to Admission medications   Medication Sig Start Date End Date Taking? Authorizing Provider  acetaminophen (TYLENOL) 500 MG tablet Take 1,000 mg by mouth every 6 (six) hours as needed for mild pain.    [provider]  albuterol (VENTOLIN HFA) 108 (90 Base) MCG/ACT inhaler Inhale 2 puffs into the lungs every 6 (six) hours as needed for wheezing or shortness of breath. 06/13/20   Colon Branch, MD  Dorzolamide HCl-Timolol Mal PF 22.3-6.8 MG/ML SOLN Place 1 drop into both eyes 2 (two) times daily.     [provider]  fenofibrate (TRICOR) 48 MG tablet Take 48 mg by mouth daily.    [provider]  fluticasone (FLONASE) 50 MCG/ACT nasal spray Place 1 spray into both nostrils daily. 09/20/20   Mercy Riding, MD  furosemide (LASIX) 20 MG tablet Take 1 tablet (20 mg total) by mouth daily. 09/20/20   Mercy Riding, MD  HYDROcodone-acetaminophen (NORCO) 5-325 MG tablet 0.5-1 tablet po bid prn pain 09/07/20   Colon Branch, MD  latanoprost (XALATAN) 0.005 % ophthalmic solution Place 1 drop into the right eye at bedtime.    [provider]  loratadine (CLARITIN) 10 MG tablet Take 1 tablet (10 mg total) by mouth daily. 09/20/20   Mercy Riding, MD  losartan (COZAAR) 25 MG tablet Take 0.5 tablets (12.5 mg  total) by mouth daily. 10/11/20 01/09/21  Bhagat, Crista Luria, PA  MELATONIN PO Take 3 mg by mouth at bedtime as needed (sleep).     [provider]  metoprolol succinate (TOPROL-XL) 50 MG 24 hr tablet Take 0.5 tablets (25 mg total) by mouth daily. Take with or immediately following a meal. 10/19/20   Colon Branch, MD  nitroGLYCERIN (NITROSTAT) 0.4 MG SL tablet Place 1 tablet (0.4 mg total) under the tongue every 5 (five) minutes as needed for chest pain. 09/14/20   Richardson Dopp T, PA-C  pantoprazole (PROTONIX) 40 MG tablet Take 1 tablet (40 mg total) by mouth daily. 08/31/20   Colon Branch, MD  senna-docusate (SENOKOT-S) 8.6-50 MG tablet Take 1 tablet by mouth 2 (two) times daily as needed for moderate constipation. 09/20/20   Mercy Riding, MD  simvastatin (ZOCOR) 10 MG tablet Take 1 tablet (10 mg total) by mouth at bedtime. 05/14/12   Colon Branch, MD  tamsulosin (FLOMAX) 0.4 MG CAPS capsule Take 1 capsule (0.4 mg total) by mouth daily after supper. 08/31/20   Colon Branch, MD    Physical Exam: Vitals:   10/22/20 1700 10/22/20 1810 10/22/20 2257 10/23/20 0205  BP: (!) 103/57 (!) 126/110 111/65 110/71  Pulse: 80 78 80 69  Resp: 20 16 (!) 22 (!) 22  Temp:  98.1 F (36.7 C) 98.5 F (36.9 C) 98.1 F (36.7 C)  TempSrc:  Oral Oral Oral  SpO2: 98% 100% 98% 100%  Weight:       Height:        Constitutional: NAD, calm, comfortable. Hard of hearing. oxgygen cannula on side of face.  Vitals:   10/22/20 1700 10/22/20 1810 10/22/20 2257 10/23/20 0205  BP: (!) 103/57 (!) 126/110 111/65 110/71  Pulse: 80 78 80 69  Resp: 20 16 (!) 22 (!) 22  Temp:  98.1 F (36.7 C) 98.5 F (36.9 C) 98.1 F (36.7 C)  TempSrc:  Oral Oral Oral  SpO2: 98% 100% 98% 100%  Weight:      Height:       Eyes: PERRL, lids and conjunctivae normal ENMT: Mucous membranes are moist. Posterior pharynx clear of any exudate or lesions. Neck: normal, supple, no masses, no thyromegaly Respiratory: LLL velcro and rales. Decreased air movement in lower lung fields. No wheezing.  Cardiovascular: Regular rate and rhythm, no murmurs / rubs / gallops. No extremity edema. 2+ pedal pulses. No carotid bruits.  Abdomen: no tenderness, no masses palpated. No hepatosplenomegaly. Bowel sounds positive.  Musculoskeletal: no clubbing / cyanosis. No joint deformity upper and lower extremities. Good ROM, no contractures. Normal muscle tone.  Skin: no rashes, lesions, ulcers. No induration Neurologic: CN 2-12 grossly intact. Sensation intact, DTR normal. Strength 5/5 in all 4.  Psychiatric: Normal judgment and insight. Alert and oriented x 3. Normal mood.    Labs on Admission: I have personally reviewed following labs and imaging studies  CBC: Recent Labs  Lab 10/22/20 1240 10/22/20 2035  WBC 3.8* 2.9*  NEUTROABS 2.3  --   HGB 13.6 13.3  HCT 40.8 40.8  MCV 94.7 97.4  PLT 131* 123*    Basic Metabolic Panel: Recent Labs  Lab 10/22/20 1240 10/22/20 2035  NA 131*  --   K 3.9  --   CL 94*  --   CO2 27  --   GLUCOSE 100*  --   BUN 21  --   CREATININE 1.03 0.92  CALCIUM 8.2*  --  GFR: Estimated Creatinine Clearance: 41.9 mL/min (by C-G formula based on SCr of 0.92 mg/dL).  Liver Function Tests: No results for input(s): AST, ALT, ALKPHOS, BILITOT, PROT, ALBUMIN in the last 168  hours.  Urine analysis:    Component Value Date/Time   COLORURINE YELLOW 09/16/2020 1207   APPEARANCEUR CLOUDY (A) 09/16/2020 1207   LABSPEC 1.016 09/16/2020 1207   PHURINE 8.0 09/16/2020 [place] 09/16/2020 1207   [place] 09/16/2020 1207   [place] 09/16/2020 [place] 09/16/2020 [place] 09/16/2020 1207   NITRITE NEGATIVE 09/16/2020 1207   LEUKOCYTESUR NEGATIVE 09/16/2020 1207    Radiological Exams on Admission: CT Angio Chest PE W and/or Wo Contrast  Result Date: 10/22/2020 CLINICAL DATA:  Hospitalized for multifocal pneumonia 1 month ago. Returned today with productive cough and shortness of breath. EXAM: CT ANGIOGRAPHY CHEST WITH CONTRAST TECHNIQUE: Multidetector CT imaging of the chest was performed using the standard protocol during bolus administration of intravenous contrast. Multiplanar CT image reconstructions and MIPs were obtained to evaluate the vascular anatomy. CONTRAST:  65mL OMNIPAQUE IOHEXOL 350 MG/ML SOLN COMPARISON:  09/16/2020 FINDINGS: Cardiovascular: Study somewhat degraded by respiratory motion. Pulmonary arteries are well opacified. There is no evidence of a pulmonary embolism. Heart is mildly enlarged. Left coronary artery calcifications. No pericardial effusion. Great vessels are normal in caliber. There are aortic atherosclerotic calcifications. Mediastinum/Nodes: No enlarged mediastinal, hilar, or axillary lymph nodes. Thyroid gland, trachea, and esophagus demonstrate no significant findings. Lungs/Pleura: Bilateral ground-glass lung opacities are superimposed on chronic interstitial thickening. Mild lower lobe bronchiectasis. No lung mass or suspicious nodule. No pleural effusion or pneumothorax. Upper Abdomen: No acute abnormality. Musculoskeletal: No fracture or acute finding. No osteoblastic or osteolytic lesions. Review of the MIP images confirms the above findings. IMPRESSION: 1. No evidence  of a pulmonary embolism. 2. Ground-glass lung opacities superimposed on chronic interstitial fibrotic changes. Suspect multifocal pneumonia versus is inflammation related to interstitial lung disease. 3. Aortic atherosclerosis. Aortic Atherosclerosis (ICD10-I70.0). Electronically Signed   By: Lajean Manes M.D.   On: 10/22/2020 15:48   DG Chest Portable 1 View  Result Date: 10/22/2020 CLINICAL DATA:  Shortness of breath EXAM: PORTABLE CHEST 1 VIEW COMPARISON:  09/16/2020 FINDINGS: Cardiac shadow is enlarged. Aortic calcifications are noted. Bibasilar fibrotic changes are again noted stable from the prior exam. No focal infiltrate or effusion is seen. No bony abnormality is noted. IMPRESSION: No acute abnormality noted. Electronically Signed   By: Inez Catalina M.D.   On: 10/22/2020 13:56    EKG: Independently reviewed. No significant change from previous ekg in October. NSR   Assessment/Plan Principal Problem:   Pneumonia due to COVID-19 virus -5 days into symptoms. Given solumedrol and remdesivir in ER. Not sure if he is really requiring oxygen from his baseline and needs remdesivir. Was using oxygen at home 2-3x/day as needed and has not changed from this.  Changing him to methylprednisolone 40mg  BID -covid labs mildly elevated. D-dimer 3.04. CTA negative for PE -high dose vitamin C and melatonin -zinc  -start SSRI since day 5. . Will do prozac better tolerated than luvox. -encourage prone, up in chair with assistance -monitor oxygen, trend covid labs -IC -contact precautions/isolation   Active Problems:   Essential hypertension -to goal today  -continue home meds: cozaar and metroprolol  -imdur was held by pcp due to hypotension recently.     Chronic systolic CHF (congestive heart failure) (HCC) -bnp wnl and troponin wnl  -echo  last done 09/17/2020. EF of 35-40%. LV moderately decreased function. Hypokinesis of left ventricular. Aortic dilation noted.  -followed by cardiology, Dr.  Meda Coffee.  -euvolemic. Continue beta blocker. Lasix, arb. -I/o and weights.     Dyslipidemia -continue statin    Coronary atherosclerosis -continue statin    GERD -continue protonix     Pulmonary fibrosis ([place]) -unsure if new diagnosis. F/u outpatient as needed with pulm.      DVT prophylaxis: Lovenox.  Code Status:   Full code Family Communication:  Elsie Lincoln, wife of patient. [phone]  Disposition Plan:   Patient is from:  home  Anticipated DC to:  home  Anticipated DC date:  4-5 days  Admission status:  inpatient   Severity of Illness: The appropriate patient status for this patient is INPATIENT. Inpatient status is judged to be reasonable and necessary in order to provide the required intensity of service to ensure the patient's safety. The patient's presenting symptoms, physical exam findings, and initial radiographic and laboratory data in the context of their chronic comorbidities is felt to place them at high risk for further clinical deterioration. Furthermore, it is not anticipated that the patient will be medically stable for discharge from the hospital within 2 midnights of admission. The following factors support the patient status of inpatient.   " The patient's presenting symptoms include respiratory failure secondary to covid 19              requiring oxygen.  * I certify that at the point of admission it is my clinical judgment that the patient will require inpatient hospital care spanning beyond 2 midnights from the point of admission due to high intensity of service, high risk for further deterioration and high frequency of surveillance required.*     Orma Flaming MD [facility]  How to contact the [facility] Attending or Consulting provider [place] or covering provider during after hours [place], for this patient?   1. Check the care team in [facility] and look for a) attending/consulting [facility] provider listed and b) the [facility] team listed 2. Log into  [email or web address] and use [place]'s universal password to access. If you do not have the password, please contact the hospital operator. 3. Locate the [facility] provider you are looking for under [facility] and page to a number that you can be directly reached. 4. If you still have difficulty reaching the provider, please page the [facility] (Director on Call) for the Hospitalists listed on amion for assistance.  10/23/2020, 3:10 AM

## 2020-10-22 NOTE — ED Notes (Signed)
2L [place] O2

## 2020-10-23 DIAGNOSIS — I5022 Chronic systolic (congestive) heart failure: Secondary | ICD-10-CM

## 2020-10-23 DIAGNOSIS — J9600 Acute respiratory failure, unspecified whether with hypoxia or hypercapnia: Secondary | ICD-10-CM

## 2020-10-23 DIAGNOSIS — I10 Essential (primary) hypertension: Secondary | ICD-10-CM

## 2020-10-23 DIAGNOSIS — J8410 Pulmonary fibrosis, unspecified: Secondary | ICD-10-CM

## 2020-10-23 LAB — CBC
HCT: 37.5 % — ABNORMAL LOW (ref 39.0–52.0)
Hemoglobin: 12.5 g/dL — ABNORMAL LOW (ref 13.0–17.0)
MCH: 31.6 pg (ref 26.0–34.0)
MCHC: 33.3 g/dL (ref 30.0–36.0)
MCV: 94.7 fL (ref 80.0–100.0)
Platelets: 128 10*3/uL — ABNORMAL LOW (ref 150–400)
RBC: 3.96 MIL/uL — ABNORMAL LOW (ref 4.22–5.81)
RDW: 14.5 % (ref 11.5–15.5)
WBC: 2.1 10*3/uL — ABNORMAL LOW (ref 4.0–10.5)
nRBC: 0 % (ref 0.0–0.2)

## 2020-10-23 LAB — COMPREHENSIVE METABOLIC PANEL
ALT: 66 U/L — ABNORMAL HIGH (ref 0–44)
AST: 55 U/L — ABNORMAL HIGH (ref 15–41)
Albumin: 3.1 g/dL — ABNORMAL LOW (ref 3.5–5.0)
Alkaline Phosphatase: 65 U/L (ref 38–126)
Anion gap: 10 (ref 5–15)
BUN: 19 mg/dL (ref 8–23)
CO2: 24 mmol/L (ref 22–32)
Calcium: 8.1 mg/dL — ABNORMAL LOW (ref 8.9–10.3)
Chloride: 99 mmol/L (ref 98–111)
Creatinine, Ser: 0.63 mg/dL (ref 0.61–1.24)
GFR, Estimated: >60 mL/min (ref >60)
Glucose, Bld: 112 mg/dL — ABNORMAL HIGH (ref 70–99)
Potassium: 3.7 mmol/L (ref 3.5–5.1)
Sodium: 133 mmol/L — ABNORMAL LOW (ref 135–145)
Total Bilirubin: 0.5 mg/dL (ref 0.3–1.2)
Total Protein: 5.6 g/dL — ABNORMAL LOW (ref 6.5–8.1)

## 2020-10-23 LAB — D-DIMER, QUANTITATIVE: D-Dimer, Quant: 2.9 ug/mL-FEU — ABNORMAL HIGH (ref 0.00–0.50)

## 2020-10-23 LAB — CREATININE, SERUM
Creatinine, Ser: 0.69 mg/dL (ref 0.61–1.24)
GFR, Estimated: >60 mL/min (ref >60)

## 2020-10-23 LAB — FERRITIN: Ferritin: 343 ng/mL — ABNORMAL HIGH (ref 24–336)

## 2020-10-23 MED ORDER — ENOXAPARIN SODIUM 40 MG/0.4ML ~~LOC~~ SOLN
40.0000 mg | SUBCUTANEOUS | Status: DC
  Administered 2020-10-23 – 2020-10-26 (×4): 40 mg via SUBCUTANEOUS
  Filled 2020-10-23 (×4): qty 0.4

## 2020-10-23 MED ORDER — METHYLPREDNISOLONE SODIUM SUCC 40 MG IJ SOLR
40.0000 mg | Freq: Two times a day (BID) | INTRAMUSCULAR | Status: DC
  Administered 2020-10-23 – 2020-10-26 (×7): 40 mg via INTRAVENOUS
  Filled 2020-10-23 (×7): qty 1

## 2020-10-23 MED ORDER — FLUOXETINE HCL 20 MG PO CAPS
20.0000 mg | ORAL_CAPSULE | Freq: Every day | ORAL | Status: DC
  Administered 2020-10-23 – 2020-10-27 (×5): 20 mg via ORAL
  Filled 2020-10-23 (×5): qty 1

## 2020-10-23 MED ORDER — MELATONIN 3 MG PO TABS
6.0000 mg | ORAL_TABLET | Freq: Every day | ORAL | Status: DC
  Administered 2020-10-23 – 2020-10-26 (×4): 6 mg via ORAL
  Filled 2020-10-23 (×4): qty 2

## 2020-10-23 NOTE — Progress Notes (Signed)
[facility] PROGRESS NOTE    Progress Note  Christopher Hogan  ZDG:[id number] DOB: [birth year] DOA: 10/22/2020 PCP: Christopher Branch, MD     Brief Narrative:   Christopher Hogan is an 86 y.o. male past medical history significant for chronic systolic heart failure with an EF of 35% by echocardiogram in October 2021, essential hypertension, pulmonary fibrosis, fully vaccinated with his last vaccine on 01/28/2020 hyperlipidemia recently admitted on the hospital on 09/16/2020 for respiratory failure with hypoxia of unclear etiology but suspect viral etiology, respiratory panel including SARS-CoV-2 PCR and influenza PCR were negative home on oxygen but relates he has been getting exertional shortness of breath in the ED he was found to be satting 100% on 2 L of oxygen SARS-CoV-2 PCR was positive D-dimer was elevated, flu PCR was negative CT angio was negative for PE but showed bilateral infiltrates with interstitial fibrotic changes.  He was started on IV remdesivir and steroids  Assessment/Plan:   Acute respiratory failure due to COVID-19 [facility]) superimposed on pulmonary fibrosis: Symptoms started about 5 days prior to admission he was started on IV steroids and remdesivir. We will start him on baricitinib, The treatment plan and use of medications and known side effects were discussed with patient/family, they were clearly explained that there is no proven definitive treatment for COVID-19 infection, any medications used here are based on published clinical articles/anecdotal data which are not peer-reviewed or randomized control trials.  Complete risks and long-term side effects are unknown, however in the best clinical judgment they seem to be of some clinical benefit rather than medical risks.  Patient/family agree with the treatment plan and want to receive the given medications. Procalcitonin has remained negative, he is mildly leukopenic, his inflammatory markers are mildly elevated. Try to wean  the patient to room air, out of bed to chair encourage incentive spirometry and flutter valve.  Essential hypertension: Continue Cozaar and metoprolol.  Controlled.  Chronic systolic heart failure: Peers to be euvolemic on physical exam continue beta-blockers, Lasix and ARB.  Dyslipidemia: Continue statins.  Pulmonary fibrosis: Follow-up with pulmonary as an outpatient.  DVT prophylaxis: lovenxo Family Communication:none Status is: Inpatient  Remains inpatient appropriate because:Hemodynamically unstable   Dispo: The patient is from: Home              Anticipated d/c is to: SNF              Anticipated d/c date is: 2 days              Patient currently is not medically stable to d/c.        Code Status:     Code Status Orders  (From admission, onward)         Start     Ordered   10/22/20 2002  Full code  Continuous        10/22/20 2008        Code Status History    Date Active Date Inactive Code Status Order ID Comments User Context   09/16/2020 1716 09/20/2020 2006 Full Code [id number]  Para Skeans, MD ED   06/30/2018 1747 07/01/2018 1338 Full Code [id number]  Fanny Skates, MD Inpatient   Advance Care Planning Activity        IV Access:    Peripheral IV   Procedures and diagnostic studies:   CT Angio Chest PE W and/or Wo Contrast  Result Date: 10/22/2020 CLINICAL DATA:  Hospitalized for multifocal pneumonia 1 month ago. Returned today  with productive cough and shortness of breath. EXAM: CT ANGIOGRAPHY CHEST WITH CONTRAST TECHNIQUE: Multidetector CT imaging of the chest was performed using the standard protocol during bolus administration of intravenous contrast. Multiplanar CT image reconstructions and MIPs were obtained to evaluate the vascular anatomy. CONTRAST:  46mL OMNIPAQUE IOHEXOL 350 MG/ML SOLN COMPARISON:  09/16/2020 FINDINGS: Cardiovascular: Study somewhat degraded by respiratory motion. Pulmonary arteries are well opacified. There is no  evidence of a pulmonary embolism. Heart is mildly enlarged. Left coronary artery calcifications. No pericardial effusion. Great vessels are normal in caliber. There are aortic atherosclerotic calcifications. Mediastinum/Nodes: No enlarged mediastinal, hilar, or axillary lymph nodes. Thyroid gland, trachea, and esophagus demonstrate no significant findings. Lungs/Pleura: Bilateral ground-glass lung opacities are superimposed on chronic interstitial thickening. Mild lower lobe bronchiectasis. No lung mass or suspicious nodule. No pleural effusion or pneumothorax. Upper Abdomen: No acute abnormality. Musculoskeletal: No fracture or acute finding. No osteoblastic or osteolytic lesions. Review of the MIP images confirms the above findings. IMPRESSION: 1. No evidence of a pulmonary embolism. 2. Ground-glass lung opacities superimposed on chronic interstitial fibrotic changes. Suspect multifocal pneumonia versus is inflammation related to interstitial lung disease. 3. Aortic atherosclerosis. Aortic Atherosclerosis (ICD10-I70.0). Electronically Signed   By: Lajean Manes M.D.   On: 10/22/2020 15:48   DG Chest Portable 1 View  Result Date: 10/22/2020 CLINICAL DATA:  Shortness of breath EXAM: PORTABLE CHEST 1 VIEW COMPARISON:  09/16/2020 FINDINGS: Cardiac shadow is enlarged. Aortic calcifications are noted. Bibasilar fibrotic changes are again noted stable from the prior exam. No focal infiltrate or effusion is seen. No bony abnormality is noted. IMPRESSION: No acute abnormality noted. Electronically Signed   By: Inez Catalina M.D.   On: 10/22/2020 13:56     Medical Consultants:    None.  Anti-Infectives:   remdesivir  Subjective:    Christopher Hogan he relates his breathing is about the same compared to yesterday.  Objective:    Vitals:   10/22/20 2257 10/23/20 0205 10/23/20 0625 10/23/20 0800  BP: 111/65 110/71 112/77 126/75  Pulse: 80 69 68 65  Resp: (!) 22 (!) 22 (!) 21 (!) 22  Temp: 98.5 F  (36.9 C) 98.1 F (36.7 C) 97.7 F (36.5 C) 98.1 F (36.7 C)  TempSrc: Oral Oral Oral Oral  SpO2: 98% 100% 99% 97%  Weight:      Height:       SpO2: 97 % O2 Flow Rate (L/min): 2 L/min FiO2 (%): (!) 2 %   Intake/Output Summary (Last 24 hours) at 10/23/2020 0914 Last data filed at 10/22/2020 2100 Gross per 24 hour  Intake --  Output 200 ml  Net -200 ml   Filed Weights   10/22/20 1244  Weight: 59 kg    Exam: General exam: In no acute distress. Respiratory system: Good air movement and diffuse crackles bilaterally Cardiovascular system: S1 & S2 heard, RRR. Gastrointestinal system: Abdomen is nondistended, soft and nontender.  Extremities: No pedal edema. Skin: No rashes, lesions or ulcers Psychiatry: Judgement and insight appear normal. Mood & affect appropriate.    Data Reviewed:    Labs: Basic Metabolic Panel: Recent Labs  Lab 10/22/20 1240 10/22/20 2035 10/23/20 0413  NA 131*  --  133*  K 3.9  --  3.7  CL 94*  --  99  CO2 27  --  24  GLUCOSE 100*  --  112*  BUN 21  --  19  CREATININE 1.03 0.92 0.69  0.63  CALCIUM 8.2*  --  8.1*   GFR Estimated Creatinine Clearance: 48.1 mL/min (by C-G formula based on SCr of 0.63 mg/dL). Liver Function Tests: Recent Labs  Lab 10/23/20 0413  AST 55*  ALT 66*  ALKPHOS 65  BILITOT 0.5  PROT 5.6*  ALBUMIN 3.1*   No results for input(s): LIPASE, AMYLASE in the last 168 hours. No results for input(s): AMMONIA in the last 168 hours. Coagulation profile No results for input(s): INR, PROTIME in the last 168 hours. COVID-19 Labs  Recent Labs    10/22/20 1240 10/22/20 2035 10/23/20 0413  DDIMER 3.04*  --  2.90*  FERRITIN  --  387* 343*  LDH  --  205*  --   CRP 4.7*  --   --     Lab Results  Component Value Date   SARSCOV2NAA POSITIVE (A) 10/22/2020   [place] NEGATIVE 09/16/2020   [place] NEGATIVE 09/16/2020   [place] Not Detected 06/08/2020    CBC: Recent Labs  Lab 10/22/20 1240  10/22/20 2035 10/23/20 0413  WBC 3.8* 2.9* 2.1*  NEUTROABS 2.3  --   --   HGB 13.6 13.3 12.5*  HCT 40.8 40.8 37.5*  MCV 94.7 97.4 94.7  PLT 131* 123* 128*   Cardiac Enzymes: No results for input(s): CKTOTAL, CKMB, CKMBINDEX, TROPONINI in the last 168 hours. BNP (last 3 results) No results for input(s): PROBNP in the last 8760 hours. CBG: No results for input(s): GLUCAP in the last 168 hours. D-Dimer: Recent Labs    10/22/20 1240 10/23/20 0413  DDIMER 3.04* 2.90*   Hgb A1c: No results for input(s): HGBA1C in the last 72 hours. Lipid Profile: No results for input(s): CHOL, HDL, LDLCALC, TRIG, CHOLHDL, LDLDIRECT in the last 72 hours. Thyroid function studies: No results for input(s): TSH, T4TOTAL, T3FREE, THYROIDAB in the last 72 hours.  Invalid input(s): FREET3 Anemia work up: Recent Labs    10/22/20 2035 10/23/20 0413  FERRITIN 387* 343*   Sepsis Labs: Recent Labs  Lab 10/22/20 1240 10/22/20 2035 10/23/20 0413  PROCALCITON  --  <0.10  --   WBC 3.8* 2.9* 2.1*   Microbiology Recent Results (from the past 240 hour(s))  Resp Panel by RT-PCR (Flu A&B, Covid) Nasopharyngeal Swab     Status: Abnormal   Collection Time: 10/22/20  2:21 PM   Specimen: Nasopharyngeal Swab; Nasopharyngeal(NP) swabs in vial transport medium  Result Value Ref Range Status   SARS Coronavirus 2 by RT PCR POSITIVE (A) NEGATIVE Final    Comment: RESULT CALLED TO, READ BACK BY AND VERIFIED WITH: REED C RN [id number] PHILLIPS C (NOTE) SARS-CoV-2 target nucleic acids are DETECTED.  The SARS-CoV-2 RNA is generally detectable in upper respiratory specimens during the acute phase of infection. Positive results are indicative of the presence of the identified virus, but do not rule out bacterial infection or co-infection with other pathogens not detected by the test. Clinical correlation with patient history and other diagnostic information is necessary to determine patient infection status.  The expected result is Negative.  Fact Sheet for Patients: EntrepreneurPulse.com.au  Fact Sheet for Healthcare Providers: IncredibleEmployment.be  This test is not yet approved or cleared by the [place] FDA and  has been authorized for detection and/or diagnosis of SARS-CoV-2 by FDA under an Emergency Use Authorization (EUA).  This EUA will remain in effect (meaning this test can be  used) for the duration of  the COVID-19 declaration under Section 564(b)(1) of the Act, 21 U.S.C. section 360bbb-3(b)(1), unless the authorization is terminated or revoked  sooner.     Influenza A by PCR NEGATIVE NEGATIVE Final   Influenza B by PCR NEGATIVE NEGATIVE Final    Comment: (NOTE) The Xpert Xpress SARS-CoV-2/FLU/RSV plus assay is intended as an aid in the diagnosis of influenza from Nasopharyngeal swab specimens and should not be used as a sole basis for treatment. Nasal washings and aspirates are unacceptable for Xpert Xpress SARS-CoV-2/FLU/RSV testing.  Fact Sheet for Patients: EntrepreneurPulse.com.au  Fact Sheet for Healthcare Providers: IncredibleEmployment.be  This test is not yet approved or cleared by the [place] FDA and has been authorized for detection and/or diagnosis of SARS-CoV-2 by FDA under an Emergency Use Authorization (EUA). This EUA will remain in effect (meaning this test can be used) for the duration of the COVID-19 declaration under Section 564(b)(1) of the Act, 21 U.S.C. section 360bbb-3(b)(1), unless the authorization is terminated or revoked.  Performed at [facility], [address] [place], [place] [address]      Medications:   . vitamin C  1,000 mg Oral TID  . dorzolamide-timolol  1 drop Both Eyes BID  . enoxaparin (LOVENOX) injection  40 mg Subcutaneous Q24H  . FLUoxetine  20 mg Oral Daily  . furosemide  20 mg Oral Daily  . latanoprost  1 drop Right Eye  QHS  . loratadine  10 mg Oral Daily  . losartan  12.5 mg Oral Daily  . melatonin  6 mg Oral QHS  . methylPREDNISolone (SOLU-MEDROL) injection  40 mg Intravenous Q12H  . metoprolol succinate  25 mg Oral Q breakfast  . pantoprazole  40 mg Oral Daily  . simvastatin  10 mg Oral QHS  . sodium chloride flush  3 mL Intravenous Q12H  . tamsulosin  0.4 mg Oral QPC supper  . zinc sulfate  220 mg Oral Daily   Continuous Infusions: . sodium chloride 250 mL (10/22/20 1641)  . sodium chloride    . remdesivir 100 mg in NS 100 mL        LOS: 1 day   Charlynne Cousins  [facility]  10/23/2020, 9:14 AM

## 2020-10-24 ENCOUNTER — Telehealth: Payer: Self-pay | Admitting: Internal Medicine

## 2020-10-24 LAB — COMPREHENSIVE METABOLIC PANEL
ALT: 82 U/L — ABNORMAL HIGH (ref 0–44)
AST: 71 U/L — ABNORMAL HIGH (ref 15–41)
Albumin: 3.2 g/dL — ABNORMAL LOW (ref 3.5–5.0)
Alkaline Phosphatase: 67 U/L (ref 38–126)
Anion gap: 11 (ref 5–15)
BUN: 15 mg/dL (ref 8–23)
CO2: 26 mmol/L (ref 22–32)
Calcium: 8.3 mg/dL — ABNORMAL LOW (ref 8.9–10.3)
Chloride: 95 mmol/L — ABNORMAL LOW (ref 98–111)
Creatinine, Ser: 0.8 mg/dL (ref 0.61–1.24)
GFR, Estimated: >60 mL/min (ref >60)
Glucose, Bld: 142 mg/dL — ABNORMAL HIGH (ref 70–99)
Potassium: 4.4 mmol/L (ref 3.5–5.1)
Sodium: 132 mmol/L — ABNORMAL LOW (ref 135–145)
Total Bilirubin: 0.6 mg/dL (ref 0.3–1.2)
Total Protein: 5.7 g/dL — ABNORMAL LOW (ref 6.5–8.1)

## 2020-10-24 LAB — D-DIMER, QUANTITATIVE: D-Dimer, Quant: 3.02 ug/mL-FEU — ABNORMAL HIGH (ref 0.00–0.50)

## 2020-10-24 LAB — FERRITIN: Ferritin: 365 ng/mL — ABNORMAL HIGH (ref 24–336)

## 2020-10-24 LAB — C-REACTIVE PROTEIN: CRP: 1.9 mg/dL — ABNORMAL HIGH (ref <1.0)

## 2020-10-24 MED ORDER — FLUTICASONE PROPIONATE 50 MCG/ACT NA SUSP
2.0000 | Freq: Every day | NASAL | Status: DC
  Administered 2020-10-24 – 2020-10-27 (×4): 2 via NASAL
  Filled 2020-10-24: qty 16

## 2020-10-24 NOTE — Progress Notes (Signed)
[facility] PROGRESS NOTE    Progress Note  Christopher Hogan  KPT:[id number] DOB: 03/26/1925 DOA: 10/22/2020 PCP: Colon Branch, MD     Brief Narrative:   Christopher Hogan is an 86 y.o. male past medical history significant for chronic systolic heart failure with an EF of 35% by echocardiogram in October 2021, essential hypertension, pulmonary fibrosis, fully vaccinated with his last vaccine on 01/28/2020 hyperlipidemia recently admitted on the hospital on 09/16/2020 for respiratory failure with hypoxia of unclear etiology but suspect viral etiology, respiratory panel including SARS-CoV-2 PCR and influenza PCR were negative home on oxygen but relates he has been getting exertional shortness of breath in the ED he was found to be satting 100% on 2 L of oxygen SARS-CoV-2 PCR was positive D-dimer was elevated, flu PCR was negative CT angio was negative for PE but showed bilateral infiltrates with interstitial fibrotic changes.  He was started on IV remdesivir and steroids  Assessment/Plan:   Acute respiratory failure due to COVID-19 [facility]) superimposed on pulmonary fibrosis: He is currently on IV remdesivir and steroids and oral baricitinib. He satting 100% on 4 L of oxygen try to wean to room air. His inflammatory markers are pending this morning. Physical therapy will probably need skilled nursing facility.  Essential hypertension: Blood pressure seems to be well controlled continue current regimen.  Chronic systolic heart failure: Appears to be euvolemic on physical exam continue beta-blocker Lasix and ARB.  Dyslipidemia: Continue statins.  Pulmonary fibrosis: Follow-up with pulmonary as an outpatient.  DVT prophylaxis: lovenxo Family Communication:none Status is: Inpatient  Remains inpatient appropriate because:Hemodynamically unstable   Dispo: The patient is from: Home              Anticipated d/c is to: SNF              Anticipated d/c date is: 2 days               Patient currently is not medically stable to d/c. Code Status:     Code Status Orders  (From admission, onward)         Start     Ordered   10/22/20 2002  Full code  Continuous        10/22/20 2008        Code Status History    Date Active Date Inactive Code Status Order ID Comments User Context   09/16/2020 1716 09/20/2020 2006 Full Code [id number]  Para Skeans, MD ED   06/30/2018 1747 07/01/2018 1338 Full Code [id number]  Fanny Skates, MD Inpatient   Advance Care Planning Activity        IV Access:    Peripheral IV   Procedures and diagnostic studies:   CT Angio Chest PE W and/or Wo Contrast  Result Date: 10/22/2020 CLINICAL DATA:  Hospitalized for multifocal pneumonia 1 month ago. Returned today with productive cough and shortness of breath. EXAM: CT ANGIOGRAPHY CHEST WITH CONTRAST TECHNIQUE: Multidetector CT imaging of the chest was performed using the standard protocol during bolus administration of intravenous contrast. Multiplanar CT image reconstructions and MIPs were obtained to evaluate the vascular anatomy. CONTRAST:  78mL OMNIPAQUE IOHEXOL 350 MG/ML SOLN COMPARISON:  09/16/2020 FINDINGS: Cardiovascular: Study somewhat degraded by respiratory motion. Pulmonary arteries are well opacified. There is no evidence of a pulmonary embolism. Heart is mildly enlarged. Left coronary artery calcifications. No pericardial effusion. Great vessels are normal in caliber. There are aortic atherosclerotic calcifications. Mediastinum/Nodes: No enlarged mediastinal, hilar, or axillary lymph  nodes. Thyroid gland, trachea, and esophagus demonstrate no significant findings. Lungs/Pleura: Bilateral ground-glass lung opacities are superimposed on chronic interstitial thickening. Mild lower lobe bronchiectasis. No lung mass or suspicious nodule. No pleural effusion or pneumothorax. Upper Abdomen: No acute abnormality. Musculoskeletal: No fracture or acute finding. No osteoblastic or osteolytic  lesions. Review of the MIP images confirms the above findings. IMPRESSION: 1. No evidence of a pulmonary embolism. 2. Ground-glass lung opacities superimposed on chronic interstitial fibrotic changes. Suspect multifocal pneumonia versus is inflammation related to interstitial lung disease. 3. Aortic atherosclerosis. Aortic Atherosclerosis (ICD10-I70.0). Electronically Signed   By: Lajean Manes M.D.   On: 10/22/2020 15:48   DG Chest Portable 1 View  Result Date: 10/22/2020 CLINICAL DATA:  Shortness of breath EXAM: PORTABLE CHEST 1 VIEW COMPARISON:  09/16/2020 FINDINGS: Cardiac shadow is enlarged. Aortic calcifications are noted. Bibasilar fibrotic changes are again noted stable from the prior exam. No focal infiltrate or effusion is seen. No bony abnormality is noted. IMPRESSION: No acute abnormality noted. Electronically Signed   By: Inez Catalina M.D.   On: 10/22/2020 13:56     Medical Consultants:    None.  Anti-Infectives:   remdesivir  Subjective:    Christopher Hogan relates his breathing is about the same compared to yesterday.  Objective:    Vitals:   10/23/20 1200 10/23/20 1600 10/23/20 2036 10/24/20 0321  BP: 116/69 123/87 122/75 129/75  Pulse: 70 66 64 64  Resp: 20 (!) 22 18 18   Temp: 97.6 F (36.4 C) 97.8 F (36.6 C) 98.2 F (36.8 C) 97.9 F (36.6 C)  TempSrc: Oral Oral Oral Axillary  SpO2: 100% 100% 100% 100%  Weight:      Height:       SpO2: 100 % O2 Flow Rate (L/min): 4 L/min FiO2 (%): (!) 2 %   Intake/Output Summary (Last 24 hours) at 10/24/2020 0841 Last data filed at 10/24/2020 0130 Gross per 24 hour  Intake 869.96 ml  Output 900 ml  Net -30.04 ml   Filed Weights   10/22/20 1244  Weight: 59 kg    Exam: General exam: In no acute distress. Respiratory system: Good air movement and diffuse crackles bilaterally Cardiovascular system: S1 & S2 heard, RRR. No JVD. Gastrointestinal system: Abdomen is nondistended, soft and nontender.  Extremities:  No pedal edema. Skin: No rashes, lesions or ulcers Psychiatry: Judgement and insight appear normal. Mood & affect appropriate. Data Reviewed:    Labs: Basic Metabolic Panel: Recent Labs  Lab 10/22/20 1240 10/22/20 1240 10/22/20 2035 10/23/20 0413 10/24/20 0408  NA 131*  --   --  133* 132*  K 3.9   < >  --  3.7 4.4  CL 94*  --   --  99 95*  CO2 27  --   --  24 26  GLUCOSE 100*  --   --  112* 142*  BUN 21  --   --  19 15  CREATININE 1.03  --  0.92 0.69  0.63 0.80  CALCIUM 8.2*  --   --  8.1* 8.3*   < > = values in this interval not displayed.   GFR Estimated Creatinine Clearance: 48.1 mL/min (by C-G formula based on SCr of 0.8 mg/dL). Liver Function Tests: Recent Labs  Lab 10/23/20 0413 10/24/20 0408  AST 55* 71*  ALT 66* 82*  ALKPHOS 65 67  BILITOT 0.5 0.6  PROT 5.6* 5.7*  ALBUMIN 3.1* 3.2*   No results for input(s): LIPASE, AMYLASE in the  last 168 hours. No results for input(s): AMMONIA in the last 168 hours. Coagulation profile No results for input(s): INR, PROTIME in the last 168 hours. COVID-19 Labs  Recent Labs    10/22/20 1240 10/22/20 2035 10/23/20 0413 10/24/20 0408  DDIMER 3.04*  --  2.90*  --   FERRITIN  --  387* 343* 365*  LDH  --  205*  --   --   CRP 4.7*  --   --   --     Lab Results  Component Value Date   SARSCOV2NAA POSITIVE (A) 10/22/2020   [place] NEGATIVE 09/16/2020   [place] NEGATIVE 09/16/2020   [place] Not Detected 06/08/2020    CBC: Recent Labs  Lab 10/22/20 1240 10/22/20 2035 10/23/20 0413  WBC 3.8* 2.9* 2.1*  NEUTROABS 2.3  --   --   HGB 13.6 13.3 12.5*  HCT 40.8 40.8 37.5*  MCV 94.7 97.4 94.7  PLT 131* 123* 128*   Cardiac Enzymes: No results for input(s): CKTOTAL, CKMB, CKMBINDEX, TROPONINI in the last 168 hours. BNP (last 3 results) No results for input(s): PROBNP in the last 8760 hours. CBG: No results for input(s): GLUCAP in the last 168 hours. D-Dimer: Recent Labs    10/22/20 1240  10/23/20 0413  DDIMER 3.04* 2.90*   Hgb A1c: No results for input(s): HGBA1C in the last 72 hours. Lipid Profile: No results for input(s): CHOL, HDL, LDLCALC, TRIG, CHOLHDL, LDLDIRECT in the last 72 hours. Thyroid function studies: No results for input(s): TSH, T4TOTAL, T3FREE, THYROIDAB in the last 72 hours.  Invalid input(s): FREET3 Anemia work up: Recent Labs    10/23/20 0413 10/24/20 0408  FERRITIN 343* 365*   Sepsis Labs: Recent Labs  Lab 10/22/20 1240 10/22/20 2035 10/23/20 0413  PROCALCITON  --  <0.10  --   WBC 3.8* 2.9* 2.1*   Microbiology Recent Results (from the past 240 hour(s))  Resp Panel by RT-PCR (Flu A&B, Covid) Nasopharyngeal Swab     Status: Abnormal   Collection Time: 10/22/20  2:21 PM   Specimen: Nasopharyngeal Swab; Nasopharyngeal(NP) swabs in vial transport medium  Result Value Ref Range Status   SARS Coronavirus 2 by RT PCR POSITIVE (A) NEGATIVE Final    Comment: RESULT CALLED TO, READ BACK BY AND VERIFIED WITH: REED C RN [id number] PHILLIPS C (NOTE) SARS-CoV-2 target nucleic acids are DETECTED.  The SARS-CoV-2 RNA is generally detectable in upper respiratory specimens during the acute phase of infection. Positive results are indicative of the presence of the identified virus, but do not rule out bacterial infection or co-infection with other pathogens not detected by the test. Clinical correlation with patient history and other diagnostic information is necessary to determine patient infection status. The expected result is Negative.  Fact Sheet for Patients: EntrepreneurPulse.com.au  Fact Sheet for Healthcare Providers: IncredibleEmployment.be  This test is not yet approved or cleared by the [place] FDA and  has been authorized for detection and/or diagnosis of SARS-CoV-2 by FDA under an Emergency Use Authorization (EUA).  This EUA will remain in effect (meaning this test can be  used) for the  duration of  the COVID-19 declaration under Section 564(b)(1) of the Act, 21 U.S.C. section 360bbb-3(b)(1), unless the authorization is terminated or revoked sooner.     Influenza A by PCR NEGATIVE NEGATIVE Final   Influenza B by PCR NEGATIVE NEGATIVE Final    Comment: (NOTE) The Xpert Xpress SARS-CoV-2/FLU/RSV plus assay is intended as an aid in the diagnosis of influenza from  Nasopharyngeal swab specimens and should not be used as a sole basis for treatment. Nasal washings and aspirates are unacceptable for Xpert Xpress SARS-CoV-2/FLU/RSV testing.  Fact Sheet for Patients: EntrepreneurPulse.com.au  Fact Sheet for Healthcare Providers: IncredibleEmployment.be  This test is not yet approved or cleared by the [place] FDA and has been authorized for detection and/or diagnosis of SARS-CoV-2 by FDA under an Emergency Use Authorization (EUA). This EUA will remain in effect (meaning this test can be used) for the duration of the COVID-19 declaration under Section 564(b)(1) of the Act, 21 U.S.C. section 360bbb-3(b)(1), unless the authorization is terminated or revoked.  Performed at [facility], [address] [place], [place] [address]      Medications:   . vitamin C  1,000 mg Oral TID  . dorzolamide-timolol  1 drop Both Eyes BID  . enoxaparin (LOVENOX) injection  40 mg Subcutaneous Q24H  . FLUoxetine  20 mg Oral Daily  . furosemide  20 mg Oral Daily  . latanoprost  1 drop Right Eye QHS  . loratadine  10 mg Oral Daily  . losartan  12.5 mg Oral Daily  . melatonin  6 mg Oral QHS  . methylPREDNISolone (SOLU-MEDROL) injection  40 mg Intravenous Q12H  . metoprolol succinate  25 mg Oral Q breakfast  . pantoprazole  40 mg Oral Daily  . simvastatin  10 mg Oral QHS  . sodium chloride flush  3 mL Intravenous Q12H  . tamsulosin  0.4 mg Oral QPC supper  . zinc sulfate  220 mg Oral Daily   Continuous Infusions: . sodium  chloride 250 mL (10/22/20 1641)  . sodium chloride    . remdesivir 100 mg in NS 100 mL 100 mg (10/23/20 1038)      LOS: 2 days   Charlynne Cousins  [facility]  10/24/2020, 8:41 AM

## 2020-10-24 NOTE — Telephone Encounter (Signed)
Pt currently admitted.

## 2020-10-24 NOTE — Telephone Encounter (Signed)
Caller: amy Call back: [phone]  Amy from the [facility] called stating patient would like medication filled at [facility] in [place]  Please send patients last office notes supporting the reason why he needs the medication to fax number [phone] Attention amy  pantoprazole (PROTONIX) EC tablet 40 mg  [[id number]]

## 2020-10-24 NOTE — Telephone Encounter (Signed)
Records faxed.

## 2020-10-25 LAB — COMPREHENSIVE METABOLIC PANEL
ALT: 124 U/L — ABNORMAL HIGH (ref 0–44)
AST: 97 U/L — ABNORMAL HIGH (ref 15–41)
Albumin: 3 g/dL — ABNORMAL LOW (ref 3.5–5.0)
Alkaline Phosphatase: 64 U/L (ref 38–126)
Anion gap: 10 (ref 5–15)
BUN: 20 mg/dL (ref 8–23)
CO2: 27 mmol/L (ref 22–32)
Calcium: 8.2 mg/dL — ABNORMAL LOW (ref 8.9–10.3)
Chloride: 95 mmol/L — ABNORMAL LOW (ref 98–111)
Creatinine, Ser: 0.88 mg/dL (ref 0.61–1.24)
GFR, Estimated: >60 mL/min (ref >60)
Glucose, Bld: 140 mg/dL — ABNORMAL HIGH (ref 70–99)
Potassium: 4 mmol/L (ref 3.5–5.1)
Sodium: 132 mmol/L — ABNORMAL LOW (ref 135–145)
Total Bilirubin: 0.6 mg/dL (ref 0.3–1.2)
Total Protein: 5.3 g/dL — ABNORMAL LOW (ref 6.5–8.1)

## 2020-10-25 LAB — C-REACTIVE PROTEIN: CRP: 1 mg/dL — ABNORMAL HIGH (ref <1.0)

## 2020-10-25 LAB — D-DIMER, QUANTITATIVE: D-Dimer, Quant: 3.08 ug/mL-FEU — ABNORMAL HIGH (ref 0.00–0.50)

## 2020-10-25 NOTE — Care Management Important Message (Signed)
Important Message  Patient Details IM Letter given to the Patient. Name: Christopher Hogan MRN: [id number] Date of Birth: 1925/01/06   Medicare Important Message Given:  Yes     Kerin Salen 10/25/2020, 12:33 PM

## 2020-10-25 NOTE — Evaluation (Signed)
Physical Therapy Evaluation Patient Details Name: Christopher Hogan MRN: [id number] DOB: 1925-01-10 Today's Date: 10/25/2020   History of Present Illness  Pt is 86 yo male with PMH including CHF, HTN, pulmonary fibrosis, and hyperlipidemia.  Pt admitted with resp failure due to COVID 19 superimposed on pulmonary fibrosis.  He is vaccinated.  Clinical Impression  Pt admitted with above diagnosis. Pt presenting with mild deficits in mobility, balance, endurance, and activity tolerance.  He was able to demonstrate ambulation safely for short distance with RW but was unsteady without AD.  Advised use of rollator or RW at home.  Pt was on RA with sats dropping to 83% with 60' of ambulation requiring 1 minute and 2 LPM O2 to recover.  Pt reporting minimal DOE. Pt with DME at home and lives at [place] with accessible home environment. He lives with his wife and has additional intermittent support from family.  Expected to progress well with therapy. Pt currently with functional limitations due to the deficits listed below (see PT Problem List). Pt will benefit from skilled PT to increase their independence and safety with mobility to allow discharge to the venue listed below.       Follow Up Recommendations Home health PT;Supervision - Intermittent    Equipment Recommendations  None recommended by PT;Other (comment) (Pt reports he can use wife's rollator as she is no longer using)    Recommendations for Other Services       Precautions / Restrictions Precautions Precautions: Fall Precaution Comments: watch O2      Mobility  Bed Mobility Overal bed mobility: Needs Assistance Bed Mobility: Supine to Sit;Sit to Supine     Supine to sit: Modified independent (Device/Increase time);HOB elevated Sit to supine: Modified independent (Device/Increase time);HOB elevated   General bed mobility comments: performed without difficulty    Transfers Overall transfer level: Needs assistance Equipment  used: Rolling walker (2 wheeled);None Transfers: Sit to/from Stand Sit to Stand: Supervision         General transfer comment: Performed safely  Ambulation/Gait Ambulation/Gait assistance: Min guard;Min assist Gait Distance (Feet): 60 Feet Assistive device: Rolling walker (2 wheeled);None Gait Pattern/deviations: Step-through pattern;Decreased stride length Gait velocity: decreased   General Gait Details: Ambulated multiple laps in room as pt on COVID precautions.  Performed 15' with RW and 81' without AD.  Pt requiring min guard with RW.  Without RW pt required min A with turns due to unsteadiness.  Advised use of RW or rollator at home.  Stairs            Wheelchair Mobility    Modified Rankin (Stroke Patients Only)       Balance Overall balance assessment: Needs assistance Sitting-balance support: No upper extremity supported Sitting balance-Leahy Scale: Normal     Standing balance support: Bilateral upper extremity supported;No upper extremity supported Standing balance-Leahy Scale: Fair Standing balance comment: Decreased balance with turns without AD                             Pertinent Vitals/Pain Pain Assessment: No/denies pain    Home Living Family/patient expects to be discharged to:: Private residence Living Arrangements: Spouse/significant other Available Help at Discharge: Family;Available 24 hours/day Type of Home: Independent living facility Home Access: Level entry     Home Layout: One level Home Equipment: Walker - 2 wheels;Bedside commode;Shower seat;Cane - single point;Walker - 4 wheels Additional Comments: wife uses rollator    Prior Function Level  of Independence: Independent   Gait / Transfers Assistance Needed: Typically does not use AD; can ambulate community distances  ADL's / Homemaking Assistance Needed: Reports independent with ADLs and light IADLs, does not drive due to vision  Comments: Daughter assist with  groceries, cooking, laundry, but she broke her foot yesterday.  Her husband is helping though     Hand Dominance        Extremity/Trunk Assessment   Upper Extremity Assessment Upper Extremity Assessment: Overall WFL for tasks assessed    Lower Extremity Assessment Lower Extremity Assessment: Overall WFL for tasks assessed (ROM WFL; MMT 5/5)    Cervical / Trunk Assessment Cervical / Trunk Assessment: Normal  Communication   Communication: HOH  Cognition Arousal/Alertness: Awake/alert Behavior During Therapy: WFL for tasks assessed/performed Overall Cognitive Status: Within Functional Limits for tasks assessed                                 General Comments: Sometimes answers question incorrectly but seems more related to [place] Comments General comments (skin integrity, edema, etc.): Pt was on RA at arrival with sats 95%, dropped to 83% with mobility requiring 2 LPM O2 to recover in < 1 minute.  Able to wean back to RA with sats 95% at rest post walk.    Exercises     Assessment/Plan    PT Assessment Patient needs continued PT services  PT Problem List Decreased mobility;Decreased activity tolerance;Cardiopulmonary status limiting activity;Decreased balance;Decreased knowledge of use of DME       PT Treatment Interventions DME instruction;Therapeutic activities;Gait training;Therapeutic exercise;Patient/family education;Functional mobility training;Balance training;Stair training    PT Goals (Current goals can be found in the Care Plan section)  Acute Rehab PT Goals Patient Stated Goal: return home PT Goal Formulation: With patient Time For Goal Achievement: 11/08/20 Potential to Achieve Goals: Good    Frequency Min 3X/week   Barriers to discharge        Co-evaluation               AM-PAC PT "6 Clicks" Mobility  Outcome Measure Help needed turning from your back to your side while in a flat bed without using bedrails?: None  Help needed moving from lying on your back to sitting on the side of a flat bed without using bedrails?: None Help needed moving to and from a bed to a chair (including a wheelchair)?: A Little Help needed standing up from a chair using your arms (e.g., wheelchair or bedside chair)?: A Little Help needed to walk in hospital room?: A Little Help needed climbing 3-5 steps with a railing? : A Little 6 Click Score: 20    End of Session Equipment Utilized During Treatment: Gait belt;Oxygen Activity Tolerance: Patient tolerated treatment well Patient left: in bed;with call bell/phone within reach;with bed alarm set Nurse Communication: Mobility status PT Visit Diagnosis: Unsteadiness on feet (R26.81)    Time: 1779-3903 PT Time Calculation (min) (ACUTE ONLY): 23 min   Charges:   PT Evaluation $PT Eval Low Complexity: 1 Low PT Treatments $Gait Training: 8-22 mins        Abran Richard, PT Acute Rehab Services Pager [phone] [facility] 10/25/2020, 5:21 PM

## 2020-10-25 NOTE — Progress Notes (Signed)
[facility] PROGRESS NOTE    Progress Note  Christopher Hogan  JME:[id number] DOB: 1925/06/23 DOA: 10/22/2020 PCP: Colon Branch, MD     Brief Narrative:   Christopher Hogan is an 86 y.o. male past medical history significant for chronic systolic heart failure with an EF of 35% by echocardiogram in October 2021, essential hypertension, pulmonary fibrosis, fully vaccinated with his last vaccine on 01/28/2020 hyperlipidemia recently admitted on the hospital on 09/16/2020 for respiratory failure with hypoxia of unclear etiology but suspect viral etiology, respiratory panel including SARS-CoV-2 PCR and influenza PCR were negative home on oxygen but relates he has been getting exertional shortness of breath in the ED he was found to be satting 100% on 2 L of oxygen SARS-CoV-2 PCR was positive D-dimer was elevated, flu PCR was negative CT angio was negative for PE but showed bilateral infiltrates with interstitial fibrotic changes.  He was started on IV remdesivir and steroids  Assessment/Plan:   Acute respiratory failure due to COVID-19 ([place]) superimposed on pulmonary fibrosis: His oxygen requirements are slowly improving he is now requiring 2 L to keep saturations greater 98%. Continue remdesivir steroids and baricitinib. Wean to room air. Continue Lovenox for DVT prophylaxis. His inflammatory markers are improving. Physical therapy evaluated the patient the recommended skilled nursing facility.  Essential hypertension: Blood pressure seems to be well controlled continue current regimen.  Chronic systolic heart failure: Appears euvolemic on physical exam continue beta-blocker, Lasix and ARB.  Dyslipidemia: Continue statins.  Pulmonary fibrosis: Follow-up with pulmonary as an outpatient.  Elevated transaminitis: Question due to possible Covid, he denies any abdominal pain he has alkaline phosphatase and bilirubin are normal.  His ALT is twice as much as AST. He denies any any  abdominal pain, his physical exam is unremarkable no right upper quadrant pain on the palpation.  DVT prophylaxis: lovenxo Family Communication:none Status is: Inpatient  Remains inpatient appropriate because:Hemodynamically unstable   Dispo: The patient is from: Home              Anticipated d/c is to: SNF              Anticipated d/c date is: 2 days              Patient currently is not medically stable to d/c. Code Status:     Code Status Orders  (From admission, onward)         Start     Ordered   10/22/20 2002  Full code  Continuous        10/22/20 2008        Code Status History    Date Active Date Inactive Code Status Order ID Comments User Context   09/16/2020 1716 09/20/2020 2006 Full Code [id number]  Para Skeans, MD ED   06/30/2018 1747 07/01/2018 1338 Full Code [id number]  Fanny Skates, MD Inpatient   Advance Care Planning Activity        IV Access:    Peripheral IV   Procedures and diagnostic studies:   No results found.   Medical Consultants:    None.  Anti-Infectives:   remdesivir  Subjective:    Christopher Hogan relates he feels slightly better compared to yesterday.  Objective:    Vitals:   10/24/20 0321 10/24/20 1353 10/24/20 1936 10/25/20 0326  BP: 129/75 (!) 115/91 107/63 118/63  Pulse: 64 79 66 63  Resp: 18 18 15 18   Temp: 97.9 F (36.6 C) 97.7 F (36.5 C) 98.2  F (36.8 C) 97.8 F (36.6 C)  TempSrc: Axillary Oral Oral Oral  SpO2: 100% 93% 98% 100%  Weight:      Height:       SpO2: 100 % O2 Flow Rate (L/min): 2 L/min FiO2 (%): (!) 2 %   Intake/Output Summary (Last 24 hours) at 10/25/2020 0840 Last data filed at 10/24/2020 2134 Gross per 24 hour  Intake 600 ml  Output 325 ml  Net 275 ml   Filed Weights   10/22/20 1244  Weight: 59 kg    Exam: General exam: In no acute distress. Respiratory system: Good air movement and clear to auscultation. Cardiovascular system: S1 & S2 heard, RRR. No JVD.  Gastrointestinal system: Abdomen is nondistended, soft and nontender.  Extremities: No pedal edema. Skin: No rashes, lesions or ulcers  Data Reviewed:    Labs: Basic Metabolic Panel: Recent Labs  Lab 10/22/20 1240 10/22/20 1240 10/22/20 2035 10/23/20 0413 10/23/20 0413 10/24/20 0408 10/25/20 0429  NA 131*  --   --  133*  --  132* 132*  K 3.9   < >  --  3.7   < > 4.4 4.0  CL 94*  --   --  99  --  95* 95*  CO2 27  --   --  24  --  26 27  GLUCOSE 100*  --   --  112*  --  142* 140*  BUN 21  --   --  19  --  15 20  CREATININE 1.03  --  0.92 0.69  0.63  --  0.80 0.88  CALCIUM 8.2*  --   --  8.1*  --  8.3* 8.2*   < > = values in this interval not displayed.   GFR Estimated Creatinine Clearance: 43.8 mL/min (by C-G formula based on SCr of 0.88 mg/dL). Liver Function Tests: Recent Labs  Lab 10/23/20 0413 10/24/20 0408 10/25/20 0429  AST 55* 71* 97*  ALT 66* 82* 124*  ALKPHOS 65 67 64  BILITOT 0.5 0.6 0.6  PROT 5.6* 5.7* 5.3*  ALBUMIN 3.1* 3.2* 3.0*   No results for input(s): LIPASE, AMYLASE in the last 168 hours. No results for input(s): AMMONIA in the last 168 hours. Coagulation profile No results for input(s): INR, PROTIME in the last 168 hours. COVID-19 Labs  Recent Labs    10/22/20 1240 10/22/20 1240 10/22/20 2035 10/23/20 0413 10/24/20 0400 10/24/20 0408 10/25/20 0429 10/25/20 0737  DDIMER 3.04*   < >  --  2.90*  --  3.02* 3.08*  --   FERRITIN  --   --  387* 343*  --  365*  --   --   LDH  --   --  205*  --   --   --   --   --   CRP 4.7*  --   --   --  1.9*  --   --  1.0*   < > = values in this interval not displayed.    Lab Results  Component Value Date   SARSCOV2NAA POSITIVE (A) 10/22/2020   [place] NEGATIVE 09/16/2020   [place] NEGATIVE 09/16/2020   [place] Not Detected 06/08/2020    CBC: Recent Labs  Lab 10/22/20 1240 10/22/20 2035 10/23/20 0413  WBC 3.8* 2.9* 2.1*  NEUTROABS 2.3  --   --   HGB 13.6 13.3 12.5*  HCT 40.8  40.8 37.5*  MCV 94.7 97.4 94.7  PLT 131* 123* 128*   Cardiac Enzymes: No results  for input(s): CKTOTAL, CKMB, CKMBINDEX, TROPONINI in the last 168 hours. BNP (last 3 results) No results for input(s): PROBNP in the last 8760 hours. CBG: No results for input(s): GLUCAP in the last 168 hours. D-Dimer: Recent Labs    10/24/20 0408 10/25/20 0429  DDIMER 3.02* 3.08*   Hgb A1c: No results for input(s): HGBA1C in the last 72 hours. Lipid Profile: No results for input(s): CHOL, HDL, LDLCALC, TRIG, CHOLHDL, LDLDIRECT in the last 72 hours. Thyroid function studies: No results for input(s): TSH, T4TOTAL, T3FREE, THYROIDAB in the last 72 hours.  Invalid input(s): FREET3 Anemia work up: Recent Labs    10/23/20 0413 10/24/20 0408  FERRITIN 343* 365*   Sepsis Labs: Recent Labs  Lab 10/22/20 1240 10/22/20 2035 10/23/20 0413  PROCALCITON  --  <0.10  --   WBC 3.8* 2.9* 2.1*   Microbiology Recent Results (from the past 240 hour(s))  Resp Panel by RT-PCR (Flu A&B, Covid) Nasopharyngeal Swab     Status: Abnormal   Collection Time: 10/22/20  2:21 PM   Specimen: Nasopharyngeal Swab; Nasopharyngeal(NP) swabs in vial transport medium  Result Value Ref Range Status   SARS Coronavirus 2 by RT PCR POSITIVE (A) NEGATIVE Final    Comment: RESULT CALLED TO, READ BACK BY AND VERIFIED WITH: REED C RN [id number] PHILLIPS C (NOTE) SARS-CoV-2 target nucleic acids are DETECTED.  The SARS-CoV-2 RNA is generally detectable in upper respiratory specimens during the acute phase of infection. Positive results are indicative of the presence of the identified virus, but do not rule out bacterial infection or co-infection with other pathogens not detected by the test. Clinical correlation with patient history and other diagnostic information is necessary to determine patient infection status. The expected result is Negative.  Fact Sheet for Patients: EntrepreneurPulse.com.au  Fact  Sheet for Healthcare Providers: IncredibleEmployment.be  This test is not yet approved or cleared by the [place] FDA and  has been authorized for detection and/or diagnosis of SARS-CoV-2 by FDA under an Emergency Use Authorization (EUA).  This EUA will remain in effect (meaning this test can be  used) for the duration of  the COVID-19 declaration under Section 564(b)(1) of the Act, 21 U.S.C. section 360bbb-3(b)(1), unless the authorization is terminated or revoked sooner.     Influenza A by PCR NEGATIVE NEGATIVE Final   Influenza B by PCR NEGATIVE NEGATIVE Final    Comment: (NOTE) The Xpert Xpress SARS-CoV-2/FLU/RSV plus assay is intended as an aid in the diagnosis of influenza from Nasopharyngeal swab specimens and should not be used as a sole basis for treatment. Nasal washings and aspirates are unacceptable for Xpert Xpress SARS-CoV-2/FLU/RSV testing.  Fact Sheet for Patients: EntrepreneurPulse.com.au  Fact Sheet for Healthcare Providers: IncredibleEmployment.be  This test is not yet approved or cleared by the [place] FDA and has been authorized for detection and/or diagnosis of SARS-CoV-2 by FDA under an Emergency Use Authorization (EUA). This EUA will remain in effect (meaning this test can be used) for the duration of the COVID-19 declaration under Section 564(b)(1) of the Act, 21 U.S.C. section 360bbb-3(b)(1), unless the authorization is terminated or revoked.  Performed at [facility], [address] [place], [place] [address]      Medications:   . vitamin C  1,000 mg Oral TID  . dorzolamide-timolol  1 drop Both Eyes BID  . enoxaparin (LOVENOX) injection  40 mg Subcutaneous Q24H  . FLUoxetine  20 mg Oral Daily  . fluticasone  2 spray  Each Nare Daily  . furosemide  20 mg Oral Daily  . latanoprost  1 drop Right Eye QHS  . loratadine  10 mg Oral Daily  . losartan  12.5 mg Oral Daily   . melatonin  6 mg Oral QHS  . methylPREDNISolone (SOLU-MEDROL) injection  40 mg Intravenous Q12H  . metoprolol succinate  25 mg Oral Q breakfast  . pantoprazole  40 mg Oral Daily  . simvastatin  10 mg Oral QHS  . sodium chloride flush  3 mL Intravenous Q12H  . tamsulosin  0.4 mg Oral QPC supper  . zinc sulfate  220 mg Oral Daily   Continuous Infusions: . sodium chloride 250 mL (10/22/20 1641)  . sodium chloride    . remdesivir 100 mg in NS 100 mL 100 mg (10/24/20 0944)      LOS: 3 days   Charlynne Cousins  [facility]  10/25/2020, 8:40 AM

## 2020-10-26 DIAGNOSIS — I2510 Atherosclerotic heart disease of native coronary artery without angina pectoris: Secondary | ICD-10-CM

## 2020-10-26 DIAGNOSIS — E785 Hyperlipidemia, unspecified: Secondary | ICD-10-CM

## 2020-10-26 DIAGNOSIS — K219 Gastro-esophageal reflux disease without esophagitis: Secondary | ICD-10-CM

## 2020-10-26 LAB — COMPREHENSIVE METABOLIC PANEL
ALT: 102 U/L — ABNORMAL HIGH (ref 0–44)
AST: 55 U/L — ABNORMAL HIGH (ref 15–41)
Albumin: 3 g/dL — ABNORMAL LOW (ref 3.5–5.0)
Alkaline Phosphatase: 59 U/L (ref 38–126)
Anion gap: 9 (ref 5–15)
BUN: 19 mg/dL (ref 8–23)
CO2: 27 mmol/L (ref 22–32)
Calcium: 8.1 mg/dL — ABNORMAL LOW (ref 8.9–10.3)
Chloride: 95 mmol/L — ABNORMAL LOW (ref 98–111)
Creatinine, Ser: 0.86 mg/dL (ref 0.61–1.24)
GFR, Estimated: >60 mL/min (ref >60)
Glucose, Bld: 140 mg/dL — ABNORMAL HIGH (ref 70–99)
Potassium: 4.4 mmol/L (ref 3.5–5.1)
Sodium: 131 mmol/L — ABNORMAL LOW (ref 135–145)
Total Bilirubin: 0.6 mg/dL (ref 0.3–1.2)
Total Protein: 5 g/dL — ABNORMAL LOW (ref 6.5–8.1)

## 2020-10-26 LAB — C-REACTIVE PROTEIN: CRP: 0.7 mg/dL (ref <1.0)

## 2020-10-26 MED ORDER — DEXAMETHASONE SODIUM PHOSPHATE 10 MG/ML IJ SOLN
6.0000 mg | Freq: Every day | INTRAMUSCULAR | Status: DC
  Administered 2020-10-27: 6 mg via INTRAVENOUS
  Filled 2020-10-26: qty 1

## 2020-10-26 MED ORDER — DEXAMETHASONE 6 MG PO TABS: 6.0000 mg | ORAL_TABLET | Freq: Every day | ORAL | 0 refills | Status: AC

## 2020-10-26 NOTE — NC FL2 (Signed)
[place] MEDICAID FL2 LEVEL OF CARE SCREENING TOOL     IDENTIFICATION  Patient Name: Christopher Hogan Birthdate: 06-20-1925 Sex: male Admission Date (Current Location): 10/22/2020  [facility] and [place] Number:  Herbalist and Address:  [facility],  [address] [place], [place]      Provider Number: [id number]  Attending Physician Name and Address:  Patrecia Pour, MD  Relative Name and Phone Number:  Arris, Meyn (Spouse) [phone]    Current Level of Care: Hospital Recommended Level of Care: [place] Prior Approval Number:    Date Approved/Denied:   PASRR Number: [id number]  Discharge Plan: SNF    Current Diagnoses: Patient Active Problem List   Diagnosis Date Noted  . Acute respiratory failure due to COVID-19 ([place]) 10/22/2020  . Respiratory failure with hypoxia ([place]) 09/16/2020  . Chronic bilateral low back pain without sciatica 09/03/2020  . Pulmonary fibrosis ([place]) 10/29/2019  . Epidermoid cyst of skin of chest 09/29/2018  . Left inguinal hernia 06/30/2018  . At risk for pulmonary fibrosis 06/25/2018  . Ischemic cardiomyopathy 06/23/2018  . Insomnia 02/22/2018  . PCP notes >>>>>>>>>>>>>>>>>>>> 07/17/2016  . Vertigo 06/20/2015  . Chronic systolic CHF (congestive heart failure) ([place]) 12/17/2013  . Eczema 07/09/2013  . Annual physical exam 12/31/2011  . Inguinal hernia 12/31/2011  . Glaucoma and macular degeneration 12/31/2011  . DEGENERATIVE JOINT DISEASE 03/17/2010  . GERD 07/21/2008  . Rhinitis 04/20/2008  . ERECTILE DYSFUNCTION 12/09/2007  . Coronary atherosclerosis 06/09/2007  . Dyslipidemia 05/06/2007  . Essential hypertension 04/30/2007    Orientation RESPIRATION BLADDER Height & Weight     Self, Time, Situation, Place  Normal Continent Weight: 59 kg Height:  5\' 7"  (170.2 cm)  BEHAVIORAL SYMPTOMS/MOOD NEUROLOGICAL BOWEL NUTRITION STATUS   (none)  (none) Continent Diet (see d/c summary)   AMBULATORY STATUS COMMUNICATION OF NEEDS Skin   Extensive Assist Verbally Normal                       Personal Care Assistance Level of Assistance  Bathing, Feeding, Dressing Bathing Assistance: Maximum assistance Feeding assistance: Independent Dressing Assistance: Limited assistance     Functional Limitations Info  Sight, Hearing, Speech Sight Info: Adequate Hearing Info: Adequate Speech Info: Adequate    SPECIAL CARE FACTORS FREQUENCY                       Contractures Contractures Info: Not present    Additional Factors Info  Code Status, Allergies Code Status Info: Full Allergies Info: Ace Inhibitors, Aspirin, Spiroalactone           Current Medications (10/26/2020):  This is the current hospital active medication list Current Facility-Administered Medications  Medication Dose Route Frequency Provider Last Rate Last Admin  . 0.9 %  sodium chloride infusion   Intravenous PRN Wyvonnia Dusky, MD 10 mL/hr at 10/22/20 1641 250 mL at 10/22/20 1641  . 0.9 %  sodium chloride infusion  250 mL Intravenous PRN Orma Flaming, MD      . acetaminophen (TYLENOL) tablet 650 mg  650 mg Oral Q6H PRN Orma Flaming, MD      . albuterol (VENTOLIN HFA) 108 (90 Base) MCG/ACT inhaler 2 puff  2 puff Inhalation Q6H PRN Wyvonnia Dusky, MD      . ascorbic acid (VITAMIN C) tablet 1,000 mg  1,000 mg Oral TID Orma Flaming, MD   1,000 mg at 10/26/20 0905  . dorzolamide-timolol (COSOPT)  22.3-6.8 MG/ML ophthalmic solution 1 drop  1 drop Both Eyes BID Patrecia Pour, MD   1 drop at 10/26/20 0906  . enoxaparin (LOVENOX) injection 40 mg  40 mg Subcutaneous Q24H Orma Flaming, MD   40 mg at 10/25/20 2044  . FLUoxetine (PROZAC) capsule 20 mg  20 mg Oral Daily Orma Flaming, MD   20 mg at 10/26/20 0905  . fluticasone (FLONASE) 50 MCG/ACT nasal spray 2 spray  2 spray Each Nare Daily Charlynne Cousins, MD   2 spray at 10/26/20 0906  . furosemide (LASIX) tablet 20 mg  20 mg Oral  Daily Wyvonnia Dusky, MD   20 mg at 10/26/20 0905  . guaiFENesin-dextromethorphan (ROBITUSSIN DM) 100-10 MG/5ML syrup 10 mL  10 mL Oral Q4H PRN Orma Flaming, MD      . latanoprost (XALATAN) 0.005 % ophthalmic solution 1 drop  1 drop Right Eye QHS Wyvonnia Dusky, MD   1 drop at 10/25/20 2045  . loratadine (CLARITIN) tablet 10 mg  10 mg Oral Daily Wyvonnia Dusky, MD   10 mg at 10/26/20 0904  . losartan (COZAAR) tablet 12.5 mg  12.5 mg Oral Daily Wyvonnia Dusky, MD   12.5 mg at 10/26/20 0905  . melatonin tablet 6 mg  6 mg Oral QHS Orma Flaming, MD   6 mg at 10/25/20 2044  . methylPREDNISolone sodium succinate (SOLU-MEDROL) 40 mg/mL injection 40 mg  40 mg Intravenous Q12H Orma Flaming, MD   40 mg at 10/26/20 0903  . metoprolol succinate (TOPROL-XL) 24 hr tablet 25 mg  25 mg Oral Q breakfast Orma Flaming, MD   25 mg at 10/26/20 0904  . pantoprazole (PROTONIX) EC tablet 40 mg  40 mg Oral Daily Wyvonnia Dusky, MD   40 mg at 10/26/20 0906  . senna-docusate (Senokot-S) tablet 1 tablet  1 tablet Oral BID PRN Wyvonnia Dusky, MD      . simvastatin (ZOCOR) tablet 10 mg  10 mg Oral QHS Wyvonnia Dusky, MD   10 mg at 10/25/20 2044  . sodium chloride flush (NS) 0.9 % injection 3 mL  3 mL Intravenous Q12H Orma Flaming, MD   3 mL at 10/26/20 0911  . sodium chloride flush (NS) 0.9 % injection 3 mL  3 mL Intravenous PRN Orma Flaming, MD      . tamsulosin [facility]) capsule 0.4 mg  0.4 mg Oral QPC supper Wyvonnia Dusky, MD   0.4 mg at 10/25/20 1732  . zinc sulfate capsule 220 mg  220 mg Oral Daily Orma Flaming, MD   220 mg at 10/26/20 1610     Discharge Medications: Please see discharge summary for a list of discharge medications.  Relevant Imaging Results:  Relevant Lab Results:   Additional Information [address] [place],

## 2020-10-26 NOTE — Plan of Care (Signed)

## 2020-10-26 NOTE — TOC Progression Note (Signed)
Transition of Care [facility]) - Progression Note    Patient Details  Name: Christopher Hogan MRN: [id number] Date of Birth: [birth year]  Transition of Care [facility]) CM/SW [place], [place] Phone Number: 10/26/2020, 12:23 PM  Clinical Narrative:   Based on report from MD, called wife of [name] to talk about dispositional plan.  She is recovering from a hip fracture, uses a walker for ambulation, is COVID negative, and feels ill equipped to handle his care if he returns home.  Furthermore, his son, who is generally a good support, is also COVID positive and symptomatic, feeling poorly.  She requests a referral to short term rehab.  Patient concurs. Bed search and insurance authorization I request initiated.  TOC will continue to follow during the course of hospitalization.     Expected Discharge Plan: [place] Barriers to Discharge: SNF Pending bed offer  Expected Discharge Plan and Services Expected Discharge Plan: [place]   Discharge Planning Services: CM Consult Post Acute Care Choice: [place] Living arrangements for the past 2 months: Apartment Expected Discharge Date: 10/26/20                                     Social Determinants of Health (SDOH) Interventions    Readmission Risk Interventions No flowsheet data found.

## 2020-10-26 NOTE — Discharge Summary (Addendum)
Physician Discharge Summary  Christopher Hogan XBJ:[id number] DOB: [birth year] DOA: 10/22/2020  PCP: Colon Branch, MD  Admit date: 10/22/2020 Discharge date: 10/27/2020  Admitted From: [facility]  Disposition: SNF   Recommendations for Outpatient Follow-up:  1. Follow up with PCP in 1-2 weeks 2. Recheck CBC and CMP at follow up 3. Follow up with pulmonary clinic after return to ILF.  Home Health: Per SNF Equipment/Devices: 2L O2 Discharge Condition: Stable CODE STATUS: Full Diet recommendation: Heart healthy  Brief/Interim Summary: Christopher Hogan a 86 y.o.malewith a history of chronic HFrEF (LVEF 35% Oct 2021), HTN, pulmonary fibrosis, HLD, and recent admission for hypoxic respiratory failure with presumed viral etiology discharged 10/29 on 2L O2. He never really felt better and in fact began feeling worse on 11/30 so went to the ED where he was hypoxic requiring 2L O2 with +SARS-CoV-2 PCR testing. CT angio was negative for PE but showed bilateral infiltrates with interstitial fibrotic changes.He was started on IV remdesivir and steroids, admitted to the hospital. Hypoxia is stable, inflammatory marker has normalized, though he remains unsteady and weaker than baseline. He has no assistance currently available at home at [facility], so SNF disposition is requested.   Discharge Diagnoses:  Principal Problem:   Acute respiratory failure due to COVID-19 [facility]) Active Problems:   Dyslipidemia   Essential hypertension   Coronary atherosclerosis   GERD   Chronic systolic CHF (congestive heart failure) (HCC)   Pulmonary fibrosis (HCC)  Acute hypoxemic respiratory failure due to covid-19 pneumonia on pulmonary fibrosis and recent hypoxic respiratory failure: Symptoms began in earnest 11/30. SARS-CoV-2 PCR positive on 12/4. - Continue supplemental oxygen with exertion which the patient requires. Recommend pulmonary follow up after discharge. May be on oxygen chronically. - Completed remdesivir (12/4 -  12/8) - Continue steroids with oral decadron for total of 10 days (prescribed on 12/8)   - Encourage OOB, IS, FV, and awake proning if able - Continue airborne, contact precautions for 21 days from positive testing.  LFT elevation: Subsiding beginning 12/8, continues improvement. Abd exam benign. Most consistent with covid effect. - Avoid hepatotoxins as able.   Thrombocytopenia: Mild, likely also related to covid and improving.  - Suggest recheck at follow up  Essential hypertension: - Continue BB, ARB, lasix.   Chronic systolic heart failure: Appears euvolemic.  - Continue metop succinate, ARB, lasix  BPH:  - Continue tamsulosin  Dyslipidemia: - Continue statin  Glaucoma: Stable - Continue gtt's  Discharge Instructions Discharge Instructions    MyChart COVID-19 home monitoring program   Complete by: Oct 26, 2020    Is the patient willing to use the [place] for home monitoring?: Yes   Diet - low sodium heart healthy   Complete by: As directed    Discharge instructions   Complete by: As directed    You are being discharged from the hospital after treatment for covid-19 infection. You are felt to be stable enough to no longer require inpatient monitoring, testing, and treatment, though you will need to follow the recommendations below: - Continue taking decadron (steroid) for 5 more days (sent to your pharmacy).  - Per CDC guidelines, you will need to remain in isolation for 21 days from your first positive covid test. - Follow up with your doctor in the next week via telehealth or seek medical attention right away if your symptoms get [place] are still encouraged to get a booster covid vaccination between 21 days (after isolation period ends)  and 90 days (before immunity is thought to wear off).  Directions for you at home:  Wear a facemask You should wear a facemask that covers your nose and mouth when you are in the same room with other people  and when you visit a healthcare provider. People who live with or visit you should also wear a facemask while they are in the same room with you.  Separate yourself from other people in your home As much as possible, you should stay in a different room from other people in your home. Also, you should use a separate bathroom, if available.  Avoid sharing household items You should not share dishes, drinking glasses, cups, eating utensils, towels, bedding, or other items with other people in your home. After using these items, you should wash them thoroughly with soap and water.  Cover your coughs and sneezes Cover your mouth and nose with a tissue when you cough or sneeze, or you can cough or sneeze into your sleeve. Throw used tissues in a lined trash can, and immediately wash your hands with soap and water for at least 20 seconds or use an alcohol-based hand rub.  Wash your [facility] your hands often and thoroughly with soap and water for at least 20 seconds. You can use an alcohol-based hand sanitizer if soap and water are not available and if your hands are not visibly dirty. Avoid touching your eyes, nose, and mouth with unwashed hands.  Directions for those who live with, or provide care at home for you:  Limit the number of people who have contact with the patient If possible, have only one caregiver for the patient. Other household members should stay in another home or place of residence. If this is not possible, they should stay in another room, or be separated from the patient as much as possible. Use a separate bathroom, if available. Restrict visitors who do not have an essential need to be in the home.  Ensure good ventilation Make sure that shared spaces in the home have good air flow, such as from an air conditioner or an opened window, weather permitting.  Wash your hands often Wash your hands often and thoroughly with soap and water for at least 20 seconds. You  can use an alcohol based hand sanitizer if soap and water are not available and if your hands are not visibly dirty. Avoid touching your eyes, nose, and mouth with unwashed hands. Use disposable paper towels to dry your hands. If not available, use dedicated cloth towels and replace them when they become wet.  Wear a facemask and gloves Wear a disposable facemask at all times in the room and gloves when you touch or have contact with the patient's blood, body fluids, and/or secretions or excretions, such as sweat, saliva, sputum, nasal mucus, vomit, urine, or feces.  Ensure the mask fits over your nose and mouth tightly, and do not touch it during use. Throw out disposable facemasks and gloves after using them. Do not reuse. Wash your hands immediately after removing your facemask and gloves. If your personal clothing becomes contaminated, carefully remove clothing and launder. Wash your hands after handling contaminated clothing. Place all used disposable facemasks, gloves, and other waste in a lined container before disposing them with other household waste. Remove gloves and wash your hands immediately after handling these items.  Do not share dishes, glasses, or other household items with the patient Avoid sharing household items. You should not share dishes, drinking  glasses, cups, eating utensils, towels, bedding, or other items with a patient who is confirmed to have, or being evaluated for, COVID-19 infection. After the person uses these items, you should wash them thoroughly with soap and water.  Wash laundry thoroughly Immediately remove and wash clothes or bedding that have blood, body fluids, and/or secretions or excretions, such as sweat, saliva, sputum, nasal mucus, vomit, urine, or feces, on them. Wear gloves when handling laundry from the patient. Read and follow directions on labels of laundry or clothing items and detergent. In general, wash and dry with the warmest temperatures  recommended on the label.  Clean all areas the individual has used often Clean all touchable surfaces, such as counters, tabletops, doorknobs, bathroom fixtures, toilets, phones, keyboards, tablets, and bedside tables, every day. Also, clean any surfaces that may have blood, body fluids, and/or secretions or excretions on them. Wear gloves when cleaning surfaces the patient has come in contact with. Use a diluted bleach solution (e.g., dilute bleach with 1 part bleach and 10 parts water) or a household disinfectant with a label that says EPA-registered for coronaviruses. To make a bleach solution at home, add 1 tablespoon of bleach to 1 quart (4 cups) of water. For a larger supply, add  cup of bleach to 1 gallon (16 cups) of water. Read labels of cleaning products and follow recommendations provided on product labels. Labels contain instructions for safe and effective use of the cleaning product including precautions you should take when applying the product, such as wearing gloves or eye protection and making sure you have good ventilation during use of the product. Remove gloves and wash hands immediately after cleaning.  Monitor yourself for signs and symptoms of illness Caregivers and household members are considered close contacts, should monitor their health, and will be asked to limit movement outside of the home to the extent possible. Follow the monitoring steps for close contacts listed on the symptom monitoring form.  If you have additional questions, contact your local health department or call the epidemiologist on call at [phone] (available 24/7). This guidance is subject to change. For the most up-to-date guidance from [facility], please refer to their website: YouBlogs.pl     Allergies as of 10/27/2020      Reactions   Ace Inhibitors Cough   Aspirin Nausea Only, Other (See Comments)   Full strength only    Spironolactone Rash      Medication List    TAKE these medications   acetaminophen 500 MG tablet Commonly known as: TYLENOL Take 1,000 mg by mouth every 6 (six) hours as needed for mild pain.   albuterol 108 (90 Base) MCG/ACT inhaler Commonly known as: VENTOLIN HFA Inhale 2 puffs into the lungs every 6 (six) hours as needed for wheezing or shortness of breath.   azelastine 0.1 % nasal spray Commonly known as: ASTELIN Place 1 spray into both nostrils 2 (two) times daily as needed for rhinitis. Use in each nostril as directed   dexamethasone 6 MG tablet Commonly known as: Decadron Take 1 tablet (6 mg total) by mouth daily for 5 days.   dorzolamidel-timolol 22.3-6.8 MG/ML Soln ophthalmic solution Commonly known as: COSOPT Place 1 drop into both eyes 2 (two) times daily.   fenofibrate 48 MG tablet Commonly known as: TRICOR Take 48 mg by mouth daily.   fluticasone 50 MCG/ACT nasal spray Commonly known as: FLONASE Place 1 spray into both nostrils daily. What changed: when to take this   furosemide 20 MG  tablet Commonly known as: LASIX Take 1 tablet (20 mg total) by mouth daily.   HYDROcodone-acetaminophen 5-325 MG tablet Commonly known as: Norco 0.5-1 tablet po bid prn pain What changed:   how much to take  how to take this  when to take this  reasons to take this  additional instructions   latanoprost 0.005 % ophthalmic solution Commonly known as: XALATAN Place 1 drop into the right eye at bedtime.   loratadine 10 MG tablet Commonly known as: CLARITIN Take 1 tablet (10 mg total) by mouth daily.   losartan 25 MG tablet Commonly known as: COZAAR Take 0.5 tablets (12.5 mg total) by mouth daily.   melatonin 3 MG Tabs tablet Take 3 mg by mouth at bedtime as needed (sleep).   metoprolol succinate 50 MG 24 hr tablet Commonly known as: TOPROL-XL Take 0.5 tablets (25 mg total) by mouth daily. Take with or immediately following a meal.   multivitamin with  minerals Tabs tablet Take 1 tablet by mouth daily.   nitroGLYCERIN 0.4 MG SL tablet Commonly known as: NITROSTAT Place 1 tablet (0.4 mg total) under the tongue every 5 (five) minutes as needed for chest pain.   pantoprazole 40 MG tablet Commonly known as: PROTONIX Take 1 tablet (40 mg total) by mouth daily.   PreserVision AREDS 2 Caps Take 1 capsule by mouth 2 (two) times daily.   senna-docusate 8.6-50 MG tablet Commonly known as: Senokot-S Take 1 tablet by mouth 2 (two) times daily as needed for moderate constipation.   simvastatin 10 MG tablet Commonly known as: ZOCOR Take 1 tablet (10 mg total) by mouth at bedtime.   tamsulosin 0.4 MG Caps capsule Commonly known as: FLOMAX Take 1 capsule (0.4 mg total) by mouth daily after supper.            Durable Medical Equipment  (From admission, onward)         Start     Ordered   10/26/20 1106  For home use only DME oxygen  Once       Question Answer Comment  Length of Need 6 Months   Mode or (Route) Nasal cannula   Liters per Minute 2   Frequency Continuous (stationary and portable oxygen unit needed)   Oxygen delivery system Gas      10/26/20 1105          Contact information for follow-up providers    Colon Branch, MD. Schedule an appointment as soon as possible for a visit.   Specialty: Internal Medicine Contact information: [address] [place] [address] [phone]            Contact information for after-discharge care    Destination    [facility] .   Service: Skilled Nursing Contact information: [address] [place] [address] [phone]                 Allergies  Allergen Reactions  . Ace Inhibitors Cough  . Aspirin Nausea Only and Other (See Comments)    Full strength only  . Spironolactone Rash    Consultations:  None  Procedures/Studies: CT Angio Chest PE W and/or Wo Contrast  Result Date: 10/22/2020 CLINICAL  DATA:  Hospitalized for multifocal pneumonia 1 month ago. Returned today with productive cough and shortness of breath. EXAM: CT ANGIOGRAPHY CHEST WITH CONTRAST TECHNIQUE: Multidetector CT imaging of the chest was performed using the standard protocol during bolus administration of intravenous contrast.  Multiplanar CT image reconstructions and MIPs were obtained to evaluate the vascular anatomy. CONTRAST:  28mL OMNIPAQUE IOHEXOL 350 MG/ML SOLN COMPARISON:  09/16/2020 FINDINGS: Cardiovascular: Study somewhat degraded by respiratory motion. Pulmonary arteries are well opacified. There is no evidence of a pulmonary embolism. Heart is mildly enlarged. Left coronary artery calcifications. No pericardial effusion. Great vessels are normal in caliber. There are aortic atherosclerotic calcifications. Mediastinum/Nodes: No enlarged mediastinal, hilar, or axillary lymph nodes. Thyroid gland, trachea, and esophagus demonstrate no significant findings. Lungs/Pleura: Bilateral ground-glass lung opacities are superimposed on chronic interstitial thickening. Mild lower lobe bronchiectasis. No lung mass or suspicious nodule. No pleural effusion or pneumothorax. Upper Abdomen: No acute abnormality. Musculoskeletal: No fracture or acute finding. No osteoblastic or osteolytic lesions. Review of the MIP images confirms the above findings. IMPRESSION: 1. No evidence of a pulmonary embolism. 2. Ground-glass lung opacities superimposed on chronic interstitial fibrotic changes. Suspect multifocal pneumonia versus is inflammation related to interstitial lung disease. 3. Aortic atherosclerosis. Aortic Atherosclerosis (ICD10-I70.0). Electronically Signed   By: Lajean Manes M.D.   On: 10/22/2020 15:48   DG Chest Portable 1 View  Result Date: 10/22/2020 CLINICAL DATA:  Shortness of breath EXAM: PORTABLE CHEST 1 VIEW COMPARISON:  09/16/2020 FINDINGS: Cardiac shadow is enlarged. Aortic calcifications are noted. Bibasilar fibrotic changes are  again noted stable from the prior exam. No focal infiltrate or effusion is seen. No bony abnormality is noted. IMPRESSION: No acute abnormality noted. Electronically Signed   By: Inez Catalina M.D.   On: 10/22/2020 13:56    Subjective: Feels better, no hypoxia or dyspnea at rest, mild dyspnea which improves with supplemental oxygen on exertion. No chest pain or other issues.  Discharge Exam: Vitals:   10/27/20 0354 10/27/20 0418  BP: 132/71   Pulse: 62   Resp: 18   Temp: 97.7 F (36.5 C)   SpO2: (!) 88% 94%   General: Pt is alert, awake, not in acute distress Cardiovascular: RRR, S1/S2 +, no rubs, no gallops Respiratory: Crackles bilaterally with good air movement and no accessory muscle use. Abdominal: Soft, NT, ND, bowel sounds + Extremities: No edema, no cyanosis  Labs: BNP (last 3 results) Recent Labs    09/16/20 1216 10/22/20 1240  BNP 389.3* 54.0   Basic Metabolic Panel: Recent Labs  Lab 10/23/20 0413 10/24/20 0408 10/25/20 0429 10/26/20 0406 10/27/20 0403  NA 133* 132* 132* 131* 132*  K 3.7 4.4 4.0 4.4 4.1  CL 99 95* 95* 95* 94*  CO2 24 26 27 27 27   GLUCOSE 112* 142* 140* 140* 98  BUN 19 15 20 19  26*  CREATININE 0.69  0.63 0.80 0.88 0.86 1.10  CALCIUM 8.1* 8.3* 8.2* 8.1* 8.2*   Liver Function Tests: Recent Labs  Lab 10/23/20 0413 10/24/20 0408 10/25/20 0429 10/26/20 0406 10/27/20 0403  AST 55* 71* 97* 55* 37  ALT 66* 82* 124* 102* 83*  ALKPHOS 65 67 64 59 57  BILITOT 0.5 0.6 0.6 0.6 0.6  PROT 5.6* 5.7* 5.3* 5.0* 5.2*  ALBUMIN 3.1* 3.2* 3.0* 3.0* 3.1*   No results for input(s): LIPASE, AMYLASE in the last 168 hours. No results for input(s): AMMONIA in the last 168 hours. CBC: Recent Labs  Lab 10/22/20 1240 10/22/20 2035 10/23/20 0413 10/27/20 0403  WBC 3.8* 2.9* 2.1* 6.5  NEUTROABS 2.3  --   --   --   HGB 13.6 13.3 12.5* 13.5  HCT 40.8 40.8 37.5* 41.7  MCV 94.7 97.4 94.7 96.3  PLT 131* 123* 128*  141*   Cardiac Enzymes: No results  for input(s): CKTOTAL, CKMB, CKMBINDEX, TROPONINI in the last 168 hours. BNP: Invalid input(s): POCBNP CBG: No results for input(s): GLUCAP in the last 168 hours. D-Dimer Recent Labs    10/25/20 0429  DDIMER 3.08*   Hgb A1c No results for input(s): HGBA1C in the last 72 hours. Lipid Profile No results for input(s): CHOL, HDL, LDLCALC, TRIG, CHOLHDL, LDLDIRECT in the last 72 hours. Thyroid function studies No results for input(s): TSH, T4TOTAL, T3FREE, THYROIDAB in the last 72 hours.  Invalid input(s): FREET3 Anemia work up No results for input(s): VITAMINB12, FOLATE, FERRITIN, TIBC, IRON, RETICCTPCT in the last 72 hours. Urinalysis    Component Value Date/Time   COLORURINE YELLOW 09/16/2020 1207   APPEARANCEUR CLOUDY (A) 09/16/2020 1207   LABSPEC 1.016 09/16/2020 1207   PHURINE 8.0 09/16/2020 [place] 09/16/2020 1207   [place] 09/16/2020 [place] 09/16/2020 [place] 09/16/2020 [place] 09/16/2020 1207   NITRITE NEGATIVE 09/16/2020 [place] 09/16/2020 1207    Microbiology Recent Results (from the past 240 hour(s))  Resp Panel by RT-PCR (Flu A&B, Covid) Nasopharyngeal Swab     Status: Abnormal   Collection Time: 10/22/20  2:21 PM   Specimen: Nasopharyngeal Swab; Nasopharyngeal(NP) swabs in vial transport medium  Result Value Ref Range Status   SARS Coronavirus 2 by RT PCR POSITIVE (A) NEGATIVE Final    Comment: RESULT CALLED TO, READ BACK BY AND VERIFIED WITH: REED C RN [id number] PHILLIPS C (NOTE) SARS-CoV-2 target nucleic acids are DETECTED.  The SARS-CoV-2 RNA is generally detectable in upper respiratory specimens during the acute phase of infection. Positive results are indicative of the presence of the identified virus, but do not rule out bacterial infection or co-infection with other pathogens not detected by the test. Clinical correlation with patient history  and other diagnostic information is necessary to determine patient infection status. The expected result is Negative.  Fact Sheet for Patients: EntrepreneurPulse.com.au  Fact Sheet for Healthcare Providers: IncredibleEmployment.be  This test is not yet approved or cleared by the [place] FDA and  has been authorized for detection and/or diagnosis of SARS-CoV-2 by FDA under an Emergency Use Authorization (EUA).  This EUA will remain in effect (meaning this test can be  used) for the duration of  the COVID-19 declaration under Section 564(b)(1) of the Act, 21 U.S.C. section 360bbb-3(b)(1), unless the authorization is terminated or revoked sooner.     Influenza A by PCR NEGATIVE NEGATIVE Final   Influenza B by PCR NEGATIVE NEGATIVE Final    Comment: (NOTE) The Xpert Xpress SARS-CoV-2/FLU/RSV plus assay is intended as an aid in the diagnosis of influenza from Nasopharyngeal swab specimens and should not be used as a sole basis for treatment. Nasal washings and aspirates are unacceptable for Xpert Xpress SARS-CoV-2/FLU/RSV testing.  Fact Sheet for Patients: EntrepreneurPulse.com.au  Fact Sheet for Healthcare Providers: IncredibleEmployment.be  This test is not yet approved or cleared by the [place] FDA and has been authorized for detection and/or diagnosis of SARS-CoV-2 by FDA under an Emergency Use Authorization (EUA). This EUA will remain in effect (meaning this test can be used) for the duration of the COVID-19 declaration under Section 564(b)(1) of the Act, 21 U.S.C. section 360bbb-3(b)(1), unless the authorization is terminated or revoked.  Performed at [facility], [address] [place], [place] [address]     Time  coordinating discharge: Approximately 40 minutes  Patrecia Pour, MD  [facility] 10/27/2020, 12:03 PM

## 2020-10-26 NOTE — Progress Notes (Signed)
PROGRESS NOTE  Christopher Hogan  IRS:[id number] DOB: 1925/02/07 DOA: 10/22/2020 PCP: Colon Branch, MD   Brief Narrative: Christopher Hogan a 86 y.o.malewith a history of chronic HFrEF (LVEF 35% Oct 2021), HTN, pulmonary fibrosis, HLD, and recent admission for hypoxic respiratory failure with presumed viral etiology discharged 10/29 on 2L O2. He never really felt better and in fact began feeling worse on 11/30 so went to the ED where he was hypoxic requiring 2L O2 with +SARS-CoV-2 PCR testing. CT angio was negative for PE but showed bilateral infiltrates with interstitial fibrotic changes.He was started on IV remdesivir and steroids, admitted to the hospital. Hypoxia is stable, inflammatory marker has normalized, though he remains unsteady and weaker than baseline. He has no assistance currently available at home at [facility], so SNF disposition is requested.   Assessment & Plan: Principal Problem:   Acute respiratory failure due to COVID-19 [facility]) Active Problems:   Dyslipidemia   Essential hypertension   Coronary atherosclerosis   GERD   Chronic systolic CHF (congestive heart failure) (HCC)   Pulmonary fibrosis (HCC)  Acute hypoxemic respiratory failure due to covid-19 pneumonia on pulmonary fibrosis and recent hypoxic respiratory failure: Symptoms began in earnest 11/30. SARS-CoV-2 PCR positive on 12/4. - Continue supplemental oxygen with exertion which the patient requires. Recommend pulmonary follow up after discharge. - Complete remdesivir (12/4 - 12/8) - Continue steroids   - Encourage OOB, IS, FV, and awake proning if able - Continue airborne, contact precautions for 21 days from positive testing.  LFT elevation: Subsiding beginning 12/8. Abd exam benign. Most consistent with covid effect. - Avoid hepatotoxins as able.   Thrombocytopenia: Mild, likely also related to covid.  - Monitor.   Essential hypertension: - Continue BB, ARB, lasix.   Chronic systolic heart failure:  Appears euvolemic.  - Continue metop succinate, ARB, lasix  BPH:  - Continue tamsulosin  Dyslipidemia: - Continue statin  Glaucoma: Stable - Continue gtt's  DVT prophylaxis: Lovenox Code Status: Full Family Communication: Wife by phone Disposition Plan:  Status is: Inpatient  Remains inpatient appropriate because:Ongoing diagnostic testing needed not appropriate for outpatient work up and Unsafe d/c plan   Dispo: The patient is from: Home / ILF              Anticipated d/c is to: SNF              Anticipated d/c date is: 1 day              Patient currently is not medically stable to d/c.  Consultants:   None  Procedures:   None  Antimicrobials:  Remdesivir   Subjective: Remains weaker than usual, requiring assistance with ambulation. With PT yesterday he was unsteady newly requiring RW and SpO2 dropped to low 80%'s with 2L O2. No chest pain or other new issues.   Objective: Vitals:   10/25/20 1409 10/25/20 1945 10/26/20 0340 10/26/20 0900  BP: 128/71 (!) 101/59 108/66 109/62  Pulse: 63 63 62 68  Resp: 18 18 18    Temp: 97.9 F (36.6 C) 98.4 F (36.9 C) 98.2 F (36.8 C)   TempSrc: Oral Oral Oral   SpO2: 92% 96% 95% 97%  Weight:      Height:        Intake/Output Summary (Last 24 hours) at 10/26/2020 1316 Last data filed at 10/25/2020 1700 Gross per 24 hour  Intake 120 ml  Output --  Net 120 ml   Filed Weights   10/22/20 1244  Weight: 59 kg    Gen: Elderly male in no distress Pulm: Non-labored breathing. Clear to auscultation bilaterally.  CV: Regular rate and rhythm. No murmur, rub, or gallop. No JVD, no pedal edema. GI: Abdomen soft, non-tender, non-distended, with normoactive bowel sounds. No organomegaly or masses felt. Ext: Warm, no deformities Skin: No rashes, lesions or ulcers on visualized skin Neuro: Alert and oriented. No focal neurological deficits. Psych: Judgement and insight appear normal. Speaks slowly with appropriate train of  thought. Mood & affect appropriate.   Data Reviewed: I have personally reviewed following labs and imaging studies  CBC: Recent Labs  Lab 10/22/20 1240 10/22/20 2035 10/23/20 0413  WBC 3.8* 2.9* 2.1*  NEUTROABS 2.3  --   --   HGB 13.6 13.3 12.5*  HCT 40.8 40.8 37.5*  MCV 94.7 97.4 94.7  PLT 131* 123* 659*   Basic Metabolic Panel: Recent Labs  Lab 10/22/20 1240 10/22/20 1240 10/22/20 2035 10/23/20 0413 10/24/20 0408 10/25/20 0429 10/26/20 0406  NA 131*  --   --  133* 132* 132* 131*  K 3.9  --   --  3.7 4.4 4.0 4.4  CL 94*  --   --  99 95* 95* 95*  CO2 27  --   --  24 26 27 27   GLUCOSE 100*  --   --  112* 142* 140* 140*  BUN 21  --   --  19 15 20 19   CREATININE 1.03   < > 0.92 0.69  0.63 0.80 0.88 0.86  CALCIUM 8.2*  --   --  8.1* 8.3* 8.2* 8.1*   < > = values in this interval not displayed.   GFR: Estimated Creatinine Clearance: 44.8 mL/min (by C-G formula based on SCr of 0.86 mg/dL). Liver Function Tests: Recent Labs  Lab 10/23/20 0413 10/24/20 0408 10/25/20 0429 10/26/20 0406  AST 55* 71* 97* 55*  ALT 66* 82* 124* 102*  ALKPHOS 65 67 64 59  BILITOT 0.5 0.6 0.6 0.6  PROT 5.6* 5.7* 5.3* 5.0*  ALBUMIN 3.1* 3.2* 3.0* 3.0*   No results for input(s): LIPASE, AMYLASE in the last 168 hours. No results for input(s): AMMONIA in the last 168 hours. Coagulation Profile: No results for input(s): INR, PROTIME in the last 168 hours. Cardiac Enzymes: No results for input(s): CKTOTAL, CKMB, CKMBINDEX, TROPONINI in the last 168 hours. BNP (last 3 results) No results for input(s): PROBNP in the last 8760 hours. HbA1C: No results for input(s): HGBA1C in the last 72 hours. CBG: No results for input(s): GLUCAP in the last 168 hours. Lipid Profile: No results for input(s): CHOL, HDL, LDLCALC, TRIG, CHOLHDL, LDLDIRECT in the last 72 hours. Thyroid Function Tests: No results for input(s): TSH, T4TOTAL, FREET4, T3FREE, THYROIDAB in the last 72 hours. Anemia Panel: Recent  Labs    10/24/20 0408  FERRITIN 365*   Urine analysis:    Component Value Date/Time   COLORURINE YELLOW 09/16/2020 1207   APPEARANCEUR CLOUDY (A) 09/16/2020 1207   LABSPEC 1.016 09/16/2020 1207   PHURINE 8.0 09/16/2020 [place] 09/16/2020 1207   [place] 09/16/2020 [place] 09/16/2020 [place] 09/16/2020 [place] 09/16/2020 1207   NITRITE NEGATIVE 09/16/2020 1207   LEUKOCYTESUR NEGATIVE 09/16/2020 1207   Recent Results (from the past 240 hour(s))  Resp Panel by RT-PCR (Flu A&B, Covid) Nasopharyngeal Swab     Status: Abnormal   Collection Time: 10/22/20  2:21 PM  Specimen: Nasopharyngeal Swab; Nasopharyngeal(NP) swabs in vial transport medium  Result Value Ref Range Status   SARS Coronavirus 2 by RT PCR POSITIVE (A) NEGATIVE Final    Comment: RESULT CALLED TO, READ BACK BY AND VERIFIED WITH: REED C RN [id number] PHILLIPS C (NOTE) SARS-CoV-2 target nucleic acids are DETECTED.  The SARS-CoV-2 RNA is generally detectable in upper respiratory specimens during the acute phase of infection. Positive results are indicative of the presence of the identified virus, but do not rule out bacterial infection or co-infection with other pathogens not detected by the test. Clinical correlation with patient history and other diagnostic information is necessary to determine patient infection status. The expected result is Negative.  Fact Sheet for Patients: EntrepreneurPulse.com.au  Fact Sheet for Healthcare Providers: IncredibleEmployment.be  This test is not yet approved or cleared by the [place] FDA and  has been authorized for detection and/or diagnosis of SARS-CoV-2 by FDA under an Emergency Use Authorization (EUA).  This EUA will remain in effect (meaning this test can be  used) for the duration of  the COVID-19 declaration under Section 564(b)(1) of the Act,  21 U.S.C. section 360bbb-3(b)(1), unless the authorization is terminated or revoked sooner.     Influenza A by PCR NEGATIVE NEGATIVE Final   Influenza B by PCR NEGATIVE NEGATIVE Final    Comment: (NOTE) The Xpert Xpress SARS-CoV-2/FLU/RSV plus assay is intended as an aid in the diagnosis of influenza from Nasopharyngeal swab specimens and should not be used as a sole basis for treatment. Nasal washings and aspirates are unacceptable for Xpert Xpress SARS-CoV-2/FLU/RSV testing.  Fact Sheet for Patients: EntrepreneurPulse.com.au  Fact Sheet for Healthcare Providers: IncredibleEmployment.be  This test is not yet approved or cleared by the [place] FDA and has been authorized for detection and/or diagnosis of SARS-CoV-2 by FDA under an Emergency Use Authorization (EUA). This EUA will remain in effect (meaning this test can be used) for the duration of the COVID-19 declaration under Section 564(b)(1) of the Act, 21 U.S.C. section 360bbb-3(b)(1), unless the authorization is terminated or revoked.  Performed at [facility], [address] [place], [place] [address]       Radiology Studies: No results found.  Scheduled Meds: . vitamin C  1,000 mg Oral TID  . dorzolamide-timolol  1 drop Both Eyes BID  . enoxaparin (LOVENOX) injection  40 mg Subcutaneous Q24H  . FLUoxetine  20 mg Oral Daily  . fluticasone  2 spray Each Nare Daily  . furosemide  20 mg Oral Daily  . latanoprost  1 drop Right Eye QHS  . loratadine  10 mg Oral Daily  . losartan  12.5 mg Oral Daily  . melatonin  6 mg Oral QHS  . methylPREDNISolone (SOLU-MEDROL) injection  40 mg Intravenous Q12H  . metoprolol succinate  25 mg Oral Q breakfast  . pantoprazole  40 mg Oral Daily  . simvastatin  10 mg Oral QHS  . sodium chloride flush  3 mL Intravenous Q12H  . tamsulosin  0.4 mg Oral QPC supper  . zinc sulfate  220 mg Oral Daily   Continuous Infusions: . sodium  chloride 250 mL (10/22/20 1641)  . sodium chloride       LOS: 4 days   Time spent: 25 minutes.  Patrecia Pour, MD [facility] [email or web address] 10/26/2020, 1:16 PM

## 2020-10-27 ENCOUNTER — Institutional Professional Consult (permissible substitution): Payer: Medicare Other | Admitting: Pulmonary Disease

## 2020-10-27 DIAGNOSIS — J8410 Pulmonary fibrosis, unspecified: Secondary | ICD-10-CM | POA: Diagnosis not present

## 2020-10-27 DIAGNOSIS — J1282 Pneumonia due to coronavirus disease 2019: Secondary | ICD-10-CM | POA: Diagnosis not present

## 2020-10-27 DIAGNOSIS — M545 Low back pain, unspecified: Secondary | ICD-10-CM | POA: Diagnosis not present

## 2020-10-27 DIAGNOSIS — R5381 Other malaise: Secondary | ICD-10-CM | POA: Diagnosis not present

## 2020-10-27 DIAGNOSIS — U071 COVID-19: Secondary | ICD-10-CM | POA: Diagnosis not present

## 2020-10-27 DIAGNOSIS — E785 Hyperlipidemia, unspecified: Secondary | ICD-10-CM | POA: Diagnosis not present

## 2020-10-27 DIAGNOSIS — H8110 Benign paroxysmal vertigo, unspecified ear: Secondary | ICD-10-CM | POA: Diagnosis not present

## 2020-10-27 DIAGNOSIS — I5022 Chronic systolic (congestive) heart failure: Secondary | ICD-10-CM | POA: Diagnosis not present

## 2020-10-27 DIAGNOSIS — K219 Gastro-esophageal reflux disease without esophagitis: Secondary | ICD-10-CM | POA: Diagnosis not present

## 2020-10-27 DIAGNOSIS — I714 Abdominal aortic aneurysm, without rupture: Secondary | ICD-10-CM | POA: Diagnosis not present

## 2020-10-27 DIAGNOSIS — I2510 Atherosclerotic heart disease of native coronary artery without angina pectoris: Secondary | ICD-10-CM | POA: Diagnosis not present

## 2020-10-27 DIAGNOSIS — J9601 Acute respiratory failure with hypoxia: Secondary | ICD-10-CM | POA: Diagnosis not present

## 2020-10-27 DIAGNOSIS — I255 Ischemic cardiomyopathy: Secondary | ICD-10-CM | POA: Diagnosis not present

## 2020-10-27 DIAGNOSIS — M2580 Other specified joint disorders, unspecified joint: Secondary | ICD-10-CM | POA: Diagnosis not present

## 2020-10-27 DIAGNOSIS — J9600 Acute respiratory failure, unspecified whether with hypoxia or hypercapnia: Secondary | ICD-10-CM | POA: Diagnosis not present

## 2020-10-27 LAB — COMPREHENSIVE METABOLIC PANEL
ALT: 83 U/L — ABNORMAL HIGH (ref 0–44)
AST: 37 U/L (ref 15–41)
Albumin: 3.1 g/dL — ABNORMAL LOW (ref 3.5–5.0)
Alkaline Phosphatase: 57 U/L (ref 38–126)
Anion gap: 11 (ref 5–15)
BUN: 26 mg/dL — ABNORMAL HIGH (ref 8–23)
CO2: 27 mmol/L (ref 22–32)
Calcium: 8.2 mg/dL — ABNORMAL LOW (ref 8.9–10.3)
Chloride: 94 mmol/L — ABNORMAL LOW (ref 98–111)
Creatinine, Ser: 1.1 mg/dL (ref 0.61–1.24)
GFR, Estimated: >60 mL/min (ref >60)
Glucose, Bld: 98 mg/dL (ref 70–99)
Potassium: 4.1 mmol/L (ref 3.5–5.1)
Sodium: 132 mmol/L — ABNORMAL LOW (ref 135–145)
Total Bilirubin: 0.6 mg/dL (ref 0.3–1.2)
Total Protein: 5.2 g/dL — ABNORMAL LOW (ref 6.5–8.1)

## 2020-10-27 LAB — CBC
HCT: 41.7 % (ref 39.0–52.0)
Hemoglobin: 13.5 g/dL (ref 13.0–17.0)
MCH: 31.2 pg (ref 26.0–34.0)
MCHC: 32.4 g/dL (ref 30.0–36.0)
MCV: 96.3 fL (ref 80.0–100.0)
Platelets: 141 10*3/uL — ABNORMAL LOW (ref 150–400)
RBC: 4.33 MIL/uL (ref 4.22–5.81)
RDW: 14.2 % (ref 11.5–15.5)
WBC: 6.5 10*3/uL (ref 4.0–10.5)
nRBC: 0 % (ref 0.0–0.2)

## 2020-10-27 NOTE — Plan of Care (Signed)
  Problem: Education: Goal: Knowledge of General Education information will improve Description: Including pain rating scale, medication(s)/side effects and non-pharmacologic comfort measures Outcome: Progressing   Problem: Health Behavior/Discharge Planning: Goal: Ability to manage health-related needs will improve Outcome: Progressing   Problem: Clinical Measurements: Goal: Will remain free from infection Outcome: Progressing Goal: Respiratory complications will improve Outcome: Progressing   Problem: Activity: Goal: Risk for activity intolerance will decrease Outcome: Progressing   

## 2020-10-27 NOTE — TOC Transition Note (Signed)
Transition of Care [facility]) - CM/SW Discharge Note   Patient Details  Name: Christopher Hogan MRN: [id number] Date of Birth: 18-Feb-1925  Transition of Care [facility]) CM/SW Contact:  Trish Mage, LCSW Phone Number: 10/27/2020, 11:49 AM   Clinical Narrative:   Received call from [facility] at [facility], who, after reviewing chart, made a bed offer for short term rehab.  This is a facility on the same grounds as his apartment.  [employer] approved 5 days, 12/9-12/13. Reference #: [id number]. Reviewer is Economist. Fax# [phone]. PTAR arranged.  Nursing, please call report to [phone], room 109. TOC sign off.    Final next level of care: Skilled Nursing Facility Barriers to Discharge: Barriers Resolved   Patient Goals and CMS Choice     Choice offered to / list presented to : [facility]  Discharge Placement                       Discharge Plan and Services   Discharge Planning Services: CM Consult Post Acute Care Choice: [place]                               Social Determinants of Health (SDOH) Interventions     Readmission Risk Interventions No flowsheet data found.

## 2020-10-27 NOTE — Plan of Care (Signed)
  Problem: Education: Goal: Knowledge of General Education information will improve Description: Including pain rating scale, medication(s)/side effects and non-pharmacologic comfort measures 10/27/2020 0459 by Joselyn Glassman, RN Outcome: Progressing   Problem: Health Behavior/Discharge Planning: Goal: Ability to manage health-related needs will improve Outcome: Progressing   Problem: Clinical Measurements: Goal: Ability to maintain clinical measurements within normal limits will improve Outcome: Progressing Goal: Will remain free from infection Outcome: Progressing Goal: Diagnostic test results will improve Outcome: Progressing Goal: Respiratory complications will improve Outcome: Progressing Goal: Cardiovascular complication will be avoided Outcome: Progressing   Problem: Activity: Goal: Risk for activity intolerance will decrease Outcome: Progressing   Problem: Nutrition: Goal: Adequate nutrition will be maintained Outcome: Progressing   Problem: Coping: Goal: Level of anxiety will decrease Outcome: Progressing   Problem: Elimination: Goal: Will not experience complications related to bowel motility Outcome: Progressing Goal: Will not experience complications related to urinary retention Outcome: Progressing   Problem: Pain Managment: Goal: General experience of comfort will improve Outcome: Progressing   Problem: Safety: Goal: Ability to remain free from injury will improve Outcome: Progressing   Problem: Skin Integrity: Goal: Risk for impaired skin integrity will decrease Outcome: Progressing   Problem: Education: Goal: Knowledge of risk factors and measures for prevention of condition will improve Outcome: Progressing   Problem: Coping: Goal: Psychosocial and spiritual needs will be supported Outcome: Progressing   Problem: Respiratory: Goal: Will maintain a patent airway Outcome: Progressing Goal: Complications related to the disease process,  condition or treatment will be avoided or minimized Outcome: Progressing

## 2020-10-27 NOTE — Plan of Care (Signed)
  Problem: Education: Goal: Knowledge of General Education information will improve Description: Including pain rating scale, medication(s)/side effects and non-pharmacologic comfort measures 10/27/2020 1323 by Mohammed Kindle, RN Outcome: Adequate for Discharge 10/27/2020 1054 by Mohammed Kindle, RN Outcome: Progressing   Problem: Health Behavior/Discharge Planning: Goal: Ability to manage health-related needs will improve 10/27/2020 1323 by Mohammed Kindle, RN Outcome: Adequate for Discharge 10/27/2020 1054 by Mohammed Kindle, RN Outcome: Progressing   Problem: Clinical Measurements: Goal: Ability to maintain clinical measurements within normal limits will improve Outcome: Adequate for Discharge Goal: Will remain free from infection 10/27/2020 1323 by Mohammed Kindle, RN Outcome: Adequate for Discharge 10/27/2020 1054 by Mohammed Kindle, RN Outcome: Progressing Goal: Diagnostic test results will improve Outcome: Adequate for Discharge Goal: Respiratory complications will improve 10/27/2020 1323 by Mohammed Kindle, RN Outcome: Adequate for Discharge 10/27/2020 1054 by Mohammed Kindle, RN Outcome: Progressing Goal: Cardiovascular complication will be avoided Outcome: Adequate for Discharge   Problem: Activity: Goal: Risk for activity intolerance will decrease 10/27/2020 1323 by Mohammed Kindle, RN Outcome: Adequate for Discharge 10/27/2020 1054 by Mohammed Kindle, RN Outcome: Progressing   Problem: Nutrition: Goal: Adequate nutrition will be maintained Outcome: Adequate for Discharge   Problem: Coping: Goal: Level of anxiety will decrease Outcome: Adequate for Discharge   Problem: Elimination: Goal: Will not experience complications related to bowel motility Outcome: Adequate for Discharge Goal: Will not experience complications related to urinary retention Outcome: Adequate for Discharge   Problem: Pain Managment: Goal:  General experience of comfort will improve Outcome: Adequate for Discharge   Problem: Safety: Goal: Ability to remain free from injury will improve Outcome: Adequate for Discharge   Problem: Skin Integrity: Goal: Risk for impaired skin integrity will decrease Outcome: Adequate for Discharge   Problem: Education: Goal: Knowledge of risk factors and measures for prevention of condition will improve Outcome: Adequate for Discharge   Problem: Coping: Goal: Psychosocial and spiritual needs will be supported Outcome: Adequate for Discharge   Problem: Respiratory: Goal: Will maintain a patent airway Outcome: Adequate for Discharge Goal: Complications related to the disease process, condition or treatment will be avoided or minimized Outcome: Adequate for Discharge

## 2020-10-27 NOTE — Progress Notes (Addendum)
RN called report to [facility] [phone]. Nurse Supervisor Dawn was able to receive report.

## 2020-10-28 DIAGNOSIS — J8410 Pulmonary fibrosis, unspecified: Secondary | ICD-10-CM | POA: Diagnosis not present

## 2020-10-28 DIAGNOSIS — R5381 Other malaise: Secondary | ICD-10-CM | POA: Diagnosis not present

## 2020-10-28 DIAGNOSIS — U071 COVID-19: Secondary | ICD-10-CM | POA: Diagnosis not present

## 2020-10-28 DIAGNOSIS — J9600 Acute respiratory failure, unspecified whether with hypoxia or hypercapnia: Secondary | ICD-10-CM | POA: Diagnosis not present

## 2020-10-31 ENCOUNTER — Encounter (INDEPENDENT_AMBULATORY_CARE_PROVIDER_SITE_OTHER): Payer: Medicare Other | Admitting: Ophthalmology

## 2020-11-01 ENCOUNTER — Institutional Professional Consult (permissible substitution): Payer: Medicare Other | Admitting: Internal Medicine

## 2020-11-09 DIAGNOSIS — I714 Abdominal aortic aneurysm, without rupture: Secondary | ICD-10-CM | POA: Diagnosis not present

## 2020-11-09 DIAGNOSIS — J9600 Acute respiratory failure, unspecified whether with hypoxia or hypercapnia: Secondary | ICD-10-CM | POA: Diagnosis not present

## 2020-11-09 DIAGNOSIS — R5381 Other malaise: Secondary | ICD-10-CM | POA: Diagnosis not present

## 2020-11-09 DIAGNOSIS — U071 COVID-19: Secondary | ICD-10-CM | POA: Diagnosis not present

## 2020-11-11 DIAGNOSIS — D649 Anemia, unspecified: Secondary | ICD-10-CM | POA: Diagnosis not present

## 2020-11-11 DIAGNOSIS — E871 Hypo-osmolality and hyponatremia: Secondary | ICD-10-CM | POA: Diagnosis not present

## 2020-11-11 DIAGNOSIS — J189 Pneumonia, unspecified organism: Secondary | ICD-10-CM | POA: Diagnosis not present

## 2020-11-11 DIAGNOSIS — I2510 Atherosclerotic heart disease of native coronary artery without angina pectoris: Secondary | ICD-10-CM | POA: Diagnosis not present

## 2020-11-11 DIAGNOSIS — I7100 Dissection of unspecified site of aorta: Secondary | ICD-10-CM | POA: Diagnosis not present

## 2020-11-11 DIAGNOSIS — N400 Enlarged prostate without lower urinary tract symptoms: Secondary | ICD-10-CM | POA: Diagnosis not present

## 2020-11-11 DIAGNOSIS — H3530 Unspecified macular degeneration: Secondary | ICD-10-CM | POA: Diagnosis not present

## 2020-11-11 DIAGNOSIS — M1990 Unspecified osteoarthritis, unspecified site: Secondary | ICD-10-CM | POA: Diagnosis not present

## 2020-11-11 DIAGNOSIS — I110 Hypertensive heart disease with heart failure: Secondary | ICD-10-CM | POA: Diagnosis not present

## 2020-11-11 DIAGNOSIS — J9601 Acute respiratory failure with hypoxia: Secondary | ICD-10-CM | POA: Diagnosis not present

## 2020-11-11 DIAGNOSIS — I252 Old myocardial infarction: Secondary | ICD-10-CM | POA: Diagnosis not present

## 2020-11-11 DIAGNOSIS — I5022 Chronic systolic (congestive) heart failure: Secondary | ICD-10-CM | POA: Diagnosis not present

## 2020-11-11 DIAGNOSIS — I351 Nonrheumatic aortic (valve) insufficiency: Secondary | ICD-10-CM | POA: Diagnosis not present

## 2020-11-11 DIAGNOSIS — I700 Atherosclerosis of aorta: Secondary | ICD-10-CM | POA: Diagnosis not present

## 2020-11-11 DIAGNOSIS — K219 Gastro-esophageal reflux disease without esophagitis: Secondary | ICD-10-CM | POA: Diagnosis not present

## 2020-11-11 DIAGNOSIS — M5430 Sciatica, unspecified side: Secondary | ICD-10-CM | POA: Diagnosis not present

## 2020-11-15 ENCOUNTER — Telehealth: Payer: Self-pay

## 2020-11-15 ENCOUNTER — Encounter: Payer: Self-pay | Admitting: Internal Medicine

## 2020-11-15 ENCOUNTER — Ambulatory Visit: Payer: Medicare Other | Admitting: Internal Medicine

## 2020-11-15 ENCOUNTER — Other Ambulatory Visit: Payer: Self-pay

## 2020-11-15 VITALS — BP 126/68 | HR 95 | Temp 98.0°F | Resp 20 | Ht 67.0 in | Wt 134.4 lb

## 2020-11-15 DIAGNOSIS — J9600 Acute respiratory failure, unspecified whether with hypoxia or hypercapnia: Secondary | ICD-10-CM | POA: Diagnosis not present

## 2020-11-15 DIAGNOSIS — M5442 Lumbago with sciatica, left side: Secondary | ICD-10-CM

## 2020-11-15 DIAGNOSIS — I5022 Chronic systolic (congestive) heart failure: Secondary | ICD-10-CM

## 2020-11-15 DIAGNOSIS — I10 Essential (primary) hypertension: Secondary | ICD-10-CM | POA: Diagnosis not present

## 2020-11-15 DIAGNOSIS — U071 COVID-19: Secondary | ICD-10-CM | POA: Diagnosis not present

## 2020-11-15 DIAGNOSIS — G8929 Other chronic pain: Secondary | ICD-10-CM

## 2020-11-15 LAB — CBC WITH DIFFERENTIAL/PLATELET
Basophils Absolute: 0.1 10*3/uL (ref 0.0–0.1)
Basophils Relative: 1 % (ref 0.0–3.0)
Eosinophils Absolute: 0.2 10*3/uL (ref 0.0–0.7)
Eosinophils Relative: 2.9 % (ref 0.0–5.0)
HCT: 37.3 % — ABNORMAL LOW (ref 39.0–52.0)
Hemoglobin: 12.6 g/dL — ABNORMAL LOW (ref 13.0–17.0)
Lymphocytes Relative: 15.2 % (ref 12.0–46.0)
Lymphs Abs: 0.8 10*3/uL (ref 0.7–4.0)
MCHC: 33.7 g/dL (ref 30.0–36.0)
MCV: 94.2 fl (ref 78.0–100.0)
Monocytes Absolute: 0.5 10*3/uL (ref 0.1–1.0)
Monocytes Relative: 9.7 % (ref 3.0–12.0)
Neutro Abs: 4 10*3/uL (ref 1.4–7.7)
Neutrophils Relative %: 71.2 % (ref 43.0–77.0)
Platelets: 177 10*3/uL (ref 150.0–400.0)
RBC: 3.96 Mil/uL — ABNORMAL LOW (ref 4.22–5.81)
RDW: 16.6 % — ABNORMAL HIGH (ref 11.5–15.5)
WBC: 5.6 10*3/uL (ref 4.0–10.5)

## 2020-11-15 LAB — COMPREHENSIVE METABOLIC PANEL
ALT: 12 U/L (ref 0–53)
AST: 15 U/L (ref 0–37)
Albumin: 3.8 g/dL (ref 3.5–5.2)
Alkaline Phosphatase: 57 U/L (ref 39–117)
BUN: 15 mg/dL (ref 6–23)
CO2: 30 mEq/L (ref 19–32)
Calcium: 8.5 mg/dL (ref 8.4–10.5)
Chloride: 98 mEq/L (ref 96–112)
Creatinine, Ser: 0.92 mg/dL (ref 0.40–1.50)
GFR: 71.9 mL/min (ref >60.00)
Glucose, Bld: 110 mg/dL — ABNORMAL HIGH (ref 70–99)
Potassium: 4 mEq/L (ref 3.5–5.1)
Sodium: 134 mEq/L — ABNORMAL LOW (ref 135–145)
Total Bilirubin: 0.6 mg/dL (ref 0.2–1.2)
Total Protein: 5.9 g/dL — ABNORMAL LOW (ref 6.0–8.3)

## 2020-11-15 MED ORDER — PANTOPRAZOLE SODIUM 40 MG PO TBEC: 40.0000 mg | DELAYED_RELEASE_TABLET | Freq: Every day | ORAL | 3 refills | Status: DC

## 2020-11-15 MED ORDER — HYDROCODONE-ACETAMINOPHEN 5-325 MG PO TABS: ORAL_TABLET | ORAL | 0 refills | Status: AC

## 2020-11-15 NOTE — Telephone Encounter (Signed)
PA approved.   Effective from 11/15/2020 through 11/15/2021.

## 2020-11-15 NOTE — Assessment & Plan Note (Signed)
Respiratory failure with hypoxia due to Covid: Was admitted to the hospital, subsequently went to a SNF, he is home since 11/10/2020. Still weak, still DOE but given what he went through I think his stable/improving. Plan: Continue oxygen at home, referred to pulmonary for a follow-up, CMP, CBC. CAD, CHF, HTN: No chest pain, no edema, continue with Lasix, losartan, metoprolol, simvastatin. Left back pain: Continue to be an issue for the patient, he is not a candidate for any major intervention, needs to control the pain for the sake of his QOL. He is aware that hydrocodone increase the risk of falls and he is willing to accept the risk. RF sent. RTC 3 months

## 2020-11-15 NOTE — Telephone Encounter (Signed)
PA initiated via Covermymeds; KEY: [id number]. Awaiting determination.

## 2020-11-15 NOTE — Patient Instructions (Addendum)
Call the pulmonary clinic to obtain an appointment with them: Number is [phone]  GO TO THE LAB : Get the blood work     GO TO THE FRONT DESK, PLEASE SCHEDULE YOUR APPOINTMENTS Come back for a check up in 3 months

## 2020-11-15 NOTE — Progress Notes (Signed)
Subjective:    Patient ID: Christopher Hogan, male    DOB: [birth year], 86 y.o.   MRN: [id number]  DOS:  11/15/2020 Type of visit - description: Hospital follow-up  Admitted to hospital 10/22/2020, discharged 5 days later to SNF.  DX: Acute respiratory failure due to Covid, he was hypoxemic, treated with remdesivir, prescribed 10 days of oral steroids, needed airborne precautions x21 days.  Was recommended to see pulmonary at some point.  LFTs: Were elevated upon admission but trending down.  Thrombocytopenia felt to be related to Covid.   Wt Readings from Last 3 Encounters:  11/15/20 134 lb 6 oz (61 kg)  10/22/20 130 lb (59 kg)  10/11/20 137 lb 3.2 oz (62.2 kg)    Review of Systems Since he left the hospital, went to SNF, and was discharged home on December 23. Appetite is good. No fever chills. No chest pain. Continue with DOE, on oxygen at home. No nausea, vomiting, diarrhea. Continue with left back pain including the left buttock.  Past Medical History:  Diagnosis Date  . AAA (abdominal aortic aneurysm) (HCC)    repaired 02/2002, renal ultrasound, Sept 2010: normal abdominal aorta, normal renal arteries   . Age-related macular degeneration, wet, left eye (HCC)   . Allergic rhinitis   . Arthritis   . At risk for pulmonary fibrosis 06/25/2018  . CAD (coronary artery disease)    stable stress test 7/11  . CHF (congestive heart failure) (HCC)   . Chronic systolic CHF (congestive heart failure) (HCC) 12/17/2013  . Coronary atherosclerosis 06/09/2007   H/O inferior wall MI- PTCA- 1989 Last stress test 2/17- inferior scar, no ischemia    . DEGENERATIVE JOINT DISEASE 03/17/2010   Qualifier: Diagnosis of  By: [name]    . Dyslipidemia 05/06/2007   LDL 59 Jan 2019     . Eczema 07/09/2013  . ED (erectile dysfunction)   . Epidermoid cyst of skin of chest 09/29/2018  . ERECTILE DYSFUNCTION 12/09/2007   Qualifier: Diagnosis of  By: Drue Novel MD, Nolon Rod.   . Essential hypertension  04/30/2007   Qualifier: Diagnosis of  By: Drue Novel MD, Nolon Rod.    . GERD 07/21/2008   Qualifier: Diagnosis of  By: Drue Novel MD, Nolon Rod.   . GERD (gastroesophageal reflux disease)   . Glaucoma and macular degeneration 12/31/2011  . Hyperlipidemia   . Hypertension   . Inguinal hernia 12/31/2011  . Insomnia 02/22/2018  . Ischemic cardiomyopathy 06/23/2018   Last EF 30-44% by Myoview Feb 2017  . Left inguinal hernia 06/30/2018  . Myocardial infarction (HCC) 1989   interior wall infarction  . PCP notes >>>>>>>>>>>>>>>>>>>> 07/17/2016  . Pneumonia 2019  . Pre-operative cardiovascular examination 12/31/2011  . Rhinitis 04/20/2008   Qualifier: Diagnosis of  By: Drue Novel MD, Nolon Rod.   . Vertigo 06/20/2015    Past Surgical History:  Procedure Laterality Date  . AAA repair  2003  . CARDIAC CATHETERIZATION    . CHOLECYSTECTOMY    . INGUINAL HERNIA REPAIR Right 1985  . INGUINAL HERNIA REPAIR Left 06/30/2018   open; w/mesh  . INGUINAL HERNIA REPAIR Left 06/30/2018   Procedure: OPEN LEFT INGUINAL HERNIA REPAIR;  Surgeon: Claud Kelp, MD;  Location: [facility] OR;  Service: General;  Laterality: Left;  . INSERTION OF MESH Left 06/30/2018   Procedure: INSERTION OF MESH;  Surgeon: Claud Kelp, MD;  Location: [facility] OR;  Service: General;  Laterality: Left;  Marland Kitchen MASS EXCISION N/A 09/29/2018   Procedure: EXCISION OF 3  CM MASS ANTERIOR CHEST WALL;  Surgeon: Claud Kelp, MD;  Location: [facility];  Service: General;  Laterality: N/A;  . PTCA  1989, 1992    Allergies as of 11/15/2020      Reactions   Ace Inhibitors Cough   Aspirin Nausea Only, Other (See Comments)   Full strength only   Spironolactone Rash      Medication List       Accurate as of November 15, 2020  8:15 PM. If you have any questions, ask your nurse or doctor.        acetaminophen 500 MG tablet Commonly known as: TYLENOL Take 1,000 mg by mouth every 6 (six) hours as needed for mild pain.   albuterol 108 (90 Base) MCG/ACT  inhaler Commonly known as: VENTOLIN HFA Inhale 2 puffs into the lungs every 6 (six) hours as needed for wheezing or shortness of breath.   azelastine 0.1 % nasal spray Commonly known as: ASTELIN Place 1 spray into both nostrils 2 (two) times daily as needed for rhinitis. Use in each nostril as directed   dorzolamidel-timolol 22.3-6.8 MG/ML Soln ophthalmic solution Commonly known as: COSOPT Place 1 drop into both eyes 2 (two) times daily.   fenofibrate 48 MG tablet Commonly known as: TRICOR Take 48 mg by mouth daily.   fluticasone 50 MCG/ACT nasal spray Commonly known as: FLONASE Place 1 spray into both nostrils daily. What changed: when to take this   furosemide 20 MG tablet Commonly known as: LASIX Take 1 tablet (20 mg total) by mouth daily.   HYDROcodone-acetaminophen 5-325 MG tablet Commonly known as: Norco 0.5-1 tablet po bid prn pain   latanoprost 0.005 % ophthalmic solution Commonly known as: XALATAN Place 1 drop into the right eye at bedtime.   loratadine 10 MG tablet Commonly known as: CLARITIN Take 1 tablet (10 mg total) by mouth daily.   losartan 25 MG tablet Commonly known as: COZAAR Take 0.5 tablets (12.5 mg total) by mouth daily.   melatonin 3 MG Tabs tablet Take 3 mg by mouth at bedtime as needed (sleep).   metoprolol succinate 50 MG 24 hr tablet Commonly known as: TOPROL-XL Take 0.5 tablets (25 mg total) by mouth daily. Take with or immediately following a meal.   multivitamin with minerals Tabs tablet Take 1 tablet by mouth daily.   nitroGLYCERIN 0.4 MG SL tablet Commonly known as: NITROSTAT Place 1 tablet (0.4 mg total) under the tongue every 5 (five) minutes as needed for chest pain.   pantoprazole 40 MG tablet Commonly known as: PROTONIX Take 1 tablet (40 mg total) by mouth daily.   PreserVision AREDS 2 Caps Take 1 capsule by mouth 2 (two) times daily.   senna-docusate 8.6-50 MG tablet Commonly known as: Senokot-S Take 1 tablet by  mouth 2 (two) times daily as needed for moderate constipation.   simvastatin 10 MG tablet Commonly known as: ZOCOR Take 1 tablet (10 mg total) by mouth at bedtime.   tamsulosin 0.4 MG Caps capsule Commonly known as: FLOMAX Take 1 capsule (0.4 mg total) by mouth daily after supper.          Objective:   Physical Exam BP 126/68 (BP Location: Right Arm, Patient Position: Sitting, Cuff Size: Small)   Pulse 95   Temp 98 F (36.7 C) (Oral)   Resp 20   Ht 5\' 7"  (1.702 m)   Wt 134 lb 6 oz (61 kg)   SpO2 91%   BMI 21.05 kg/m  General:  Well developed, NAD, BMI noted.  HEENT:  Normocephalic . Face symmetric, atraumatic Lungs:  Decreased breath sounds, + dry crackles at bases Normal respiratory effort, no intercostal retractions, no accessory muscle use. Heart: RRR,  no murmur.  Abdomen:  Not distended, soft, non-tender. No rebound or rigidity.   Skin: Not pale. Not jaundice Lower extremities: no pretibial edema bilaterally  Neurologic:  alert & oriented X3.  Speech normal, gait assisted by walker. Psych--  Cognition and judgment appear intact.  Cooperative with normal attention span and concentration.  Behavior appropriate. No anxious or depressed appearing.     Assessment      Assessment HTN Hyperlipidemia GERD Insomnia-- used to use elavil, re start  prn All rhinitis CV: --CAD, MI 1989, stress test (-) 2011 ,lexiscan no ischemia  08-2016  --CHF --AAA, s/p repair 2003, [place] 2010 ok Opht: glaucoma, macular degeneration  [facility]  Question of Pulmonary fibrosis (on clinical grounds, see note from 10/2019) Barium swallow on 05/13/2020: Rx chin tuck, small sips Back pain  admited 08-2020, resp Failure, d/c on home O2   PLAN: Respiratory failure with hypoxia due to Covid: Was admitted to the hospital, subsequently went to a SNF, he is home since 11/10/2020. Still weak, still DOE but given what he went through I think his stable/improving. Plan:  Continue oxygen at home, referred to pulmonary for a follow-up, CMP, CBC. CAD, CHF, HTN: No chest pain, no edema, continue with Lasix, losartan, metoprolol, simvastatin. Left back pain: Continue to be an issue for the patient, he is not a candidate for any major intervention, needs to control the pain for the sake of his QOL. He is aware that hydrocodone increase the risk of falls and he is willing to accept the risk. RF sent. RTC 3 months  This visit occurred during the SARS-CoV-2 public health emergency.  Safety protocols were in place, including screening questions prior to the visit, additional usage of staff PPE, and extensive cleaning of exam room while observing appropriate contact time as indicated for disinfecting solutions.

## 2020-11-21 ENCOUNTER — Telehealth: Payer: Self-pay | Admitting: Cardiology

## 2020-11-21 ENCOUNTER — Telehealth: Payer: Self-pay | Admitting: Internal Medicine

## 2020-11-21 DIAGNOSIS — H409 Unspecified glaucoma: Secondary | ICD-10-CM | POA: Diagnosis not present

## 2020-11-21 DIAGNOSIS — M5430 Sciatica, unspecified side: Secondary | ICD-10-CM | POA: Diagnosis not present

## 2020-11-21 DIAGNOSIS — U071 COVID-19: Secondary | ICD-10-CM | POA: Diagnosis not present

## 2020-11-21 DIAGNOSIS — I2510 Atherosclerotic heart disease of native coronary artery without angina pectoris: Secondary | ICD-10-CM | POA: Diagnosis not present

## 2020-11-21 DIAGNOSIS — H353220 Exudative age-related macular degeneration, left eye, stage unspecified: Secondary | ICD-10-CM | POA: Diagnosis not present

## 2020-11-21 DIAGNOSIS — I351 Nonrheumatic aortic (valve) insufficiency: Secondary | ICD-10-CM | POA: Diagnosis not present

## 2020-11-21 DIAGNOSIS — I110 Hypertensive heart disease with heart failure: Secondary | ICD-10-CM | POA: Diagnosis not present

## 2020-11-21 DIAGNOSIS — M1990 Unspecified osteoarthritis, unspecified site: Secondary | ICD-10-CM | POA: Diagnosis not present

## 2020-11-21 DIAGNOSIS — I252 Old myocardial infarction: Secondary | ICD-10-CM | POA: Diagnosis not present

## 2020-11-21 DIAGNOSIS — J8410 Pulmonary fibrosis, unspecified: Secondary | ICD-10-CM | POA: Diagnosis not present

## 2020-11-21 DIAGNOSIS — J9601 Acute respiratory failure with hypoxia: Secondary | ICD-10-CM | POA: Diagnosis not present

## 2020-11-21 DIAGNOSIS — I5022 Chronic systolic (congestive) heart failure: Secondary | ICD-10-CM | POA: Diagnosis not present

## 2020-11-21 DIAGNOSIS — J1282 Pneumonia due to coronavirus disease 2019: Secondary | ICD-10-CM | POA: Diagnosis not present

## 2020-11-21 DIAGNOSIS — I700 Atherosclerosis of aorta: Secondary | ICD-10-CM | POA: Diagnosis not present

## 2020-11-21 DIAGNOSIS — D649 Anemia, unspecified: Secondary | ICD-10-CM | POA: Diagnosis not present

## 2020-11-21 DIAGNOSIS — I7100 Dissection of unspecified site of aorta: Secondary | ICD-10-CM | POA: Diagnosis not present

## 2020-11-21 NOTE — Telephone Encounter (Signed)
Dr. Delton See, pt was discontinued off of losartan by PCP Dr. Drue Novel on 10/21/20, as copied below. He was then admitted to the hospital on 10/22/20 with COVID-19, where he was restarted on losartan at discharge from the hospital on 12/9.  Wife is now calling to report his BPs and HR as mentioned in this message, and notes his readings are lower than normal. Wife voiced no other cardiac complaints for the pt. Wife is inquiring if he should continue taking his losartan, or should this regimen be stopped. Please advise.  Will follow-up with pts wife accordingly thereafter.       Wanda Plump, MD     10/21/20 4:31 PM Note BP at the cardiology office 10/11/2020: 120/74. Losartan was added Subsequently BP was low, cardiology recommended to recommended to stop Imdur. I recommended to decrease metoprolol to half tablet  Now patient has developed a low-grade temp, BP 93/53, some cough and sneezing. Please advise patient wife: I cannot tell exactly what is going on without seeing the patient, recommend to stop losartan completely, that may help with blood pressure. However low-grade fever, low blood pressure, respiratory symptoms may indicate a infection such as pneumonia thus I rec ER eval. If unwilling to go, recommend : Drink plenty fluids and take some Tylenol but if no improvement: seek medical attention

## 2020-11-21 NOTE — Telephone Encounter (Signed)
As long as he is not having fever, chills, burning when he urinates, blood in the urine and his blood pressure is okay, okay to continue Lasix.

## 2020-11-21 NOTE — Telephone Encounter (Signed)
I would discontinue losartan

## 2020-11-21 NOTE — Telephone Encounter (Signed)
Patient wife calling back water pill husband takes.     furosemide (LASIX) 20 MG tablet [[id number]]     He is frequently going to the bathroom  Is that okay or should he cut back on medication?  Please advise

## 2020-11-21 NOTE — Telephone Encounter (Signed)
Spoke with the pts wife and informed her that per Dr. Delton See, she recommends that the pt stop taking losartan. Advised the pts wife to continue monitoring his BP/HR, and she notices elevation, to give our office or Dr. Leta Jungling office a call, and inform us of this. Wife verbalized understanding and agrees with this plan. Wife was more than gracious for all the assistance provided.

## 2020-11-21 NOTE — Telephone Encounter (Signed)
Please advise 

## 2020-11-21 NOTE — Telephone Encounter (Signed)
Pt c/o medication issue:  1. Name of Medication: losartan (COZAAR) 25 MG tablet  2. How are you currently taking this medication (dosage and times per day)? As prescribed.  3. Are you having a reaction (difficulty breathing--STAT)? No.  4. What is your medication issue? Patients wife states that he was taken off this medication on 10/21/2020 and he was admitted into the hospital on 10/22/2020. She states that when he was discharged from the hospital she realized that the patient has been put back on this medication and wants to know why. Please advise.  Pt c/o BP issue: STAT if pt c/o blurred vision, one-sided weakness or slurred speech  1. What are your last 5 BP readings?   11/19/2020 90/60  11/20/2020 112/60  11/21/2020 101/59 HR 79  2. Are you having any other symptoms (ex. Dizziness, headache, blurred vision, passed out)? No.  3. What is your BP issue? Patients wife states that she notices that his BP is lower than normal.

## 2020-11-22 DIAGNOSIS — J9691 Respiratory failure, unspecified with hypoxia: Secondary | ICD-10-CM | POA: Diagnosis not present

## 2020-11-22 DIAGNOSIS — I5022 Chronic systolic (congestive) heart failure: Secondary | ICD-10-CM | POA: Diagnosis not present

## 2020-11-22 DIAGNOSIS — J8410 Pulmonary fibrosis, unspecified: Secondary | ICD-10-CM | POA: Diagnosis not present

## 2020-11-22 NOTE — Telephone Encounter (Signed)
Spoke w/ Christopher Hogan- informed of recommendations. Christopher Hogan verbalized understanding.

## 2020-11-24 ENCOUNTER — Telehealth: Payer: Self-pay

## 2020-11-24 NOTE — Telephone Encounter (Signed)
Plan of care signed and faxed back to [facility] at [phone]. Form sent for scanning.  

## 2020-11-25 ENCOUNTER — Telehealth: Payer: Self-pay

## 2020-11-25 NOTE — Telephone Encounter (Signed)
Physician orders received from [facility]. Forms signed and faxed back to [phone]. Forms sent for scanning.

## 2020-11-29 ENCOUNTER — Ambulatory Visit: Payer: Medicare Other | Admitting: Internal Medicine

## 2020-12-02 ENCOUNTER — Encounter (INDEPENDENT_AMBULATORY_CARE_PROVIDER_SITE_OTHER): Payer: Medicare Other | Admitting: Ophthalmology

## 2020-12-02 ENCOUNTER — Telehealth: Payer: Self-pay

## 2020-12-02 ENCOUNTER — Other Ambulatory Visit: Payer: Self-pay

## 2020-12-02 DIAGNOSIS — N529 Male erectile dysfunction, unspecified: Secondary | ICD-10-CM

## 2020-12-02 DIAGNOSIS — H353231 Exudative age-related macular degeneration, bilateral, with active choroidal neovascularization: Secondary | ICD-10-CM | POA: Diagnosis not present

## 2020-12-02 DIAGNOSIS — H43813 Vitreous degeneration, bilateral: Secondary | ICD-10-CM | POA: Diagnosis not present

## 2020-12-02 DIAGNOSIS — H35033 Hypertensive retinopathy, bilateral: Secondary | ICD-10-CM

## 2020-12-02 DIAGNOSIS — N400 Enlarged prostate without lower urinary tract symptoms: Secondary | ICD-10-CM

## 2020-12-02 DIAGNOSIS — M1990 Unspecified osteoarthritis, unspecified site: Secondary | ICD-10-CM

## 2020-12-02 DIAGNOSIS — I351 Nonrheumatic aortic (valve) insufficiency: Secondary | ICD-10-CM

## 2020-12-02 DIAGNOSIS — H409 Unspecified glaucoma: Secondary | ICD-10-CM

## 2020-12-02 DIAGNOSIS — I110 Hypertensive heart disease with heart failure: Secondary | ICD-10-CM | POA: Diagnosis not present

## 2020-12-02 DIAGNOSIS — M5430 Sciatica, unspecified side: Secondary | ICD-10-CM

## 2020-12-02 DIAGNOSIS — I2510 Atherosclerotic heart disease of native coronary artery without angina pectoris: Secondary | ICD-10-CM

## 2020-12-02 DIAGNOSIS — H353220 Exudative age-related macular degeneration, left eye, stage unspecified: Secondary | ICD-10-CM

## 2020-12-02 DIAGNOSIS — I700 Atherosclerosis of aorta: Secondary | ICD-10-CM

## 2020-12-02 DIAGNOSIS — J9601 Acute respiratory failure with hypoxia: Secondary | ICD-10-CM | POA: Diagnosis not present

## 2020-12-02 DIAGNOSIS — E871 Hypo-osmolality and hyponatremia: Secondary | ICD-10-CM

## 2020-12-02 DIAGNOSIS — G4700 Insomnia, unspecified: Secondary | ICD-10-CM

## 2020-12-02 DIAGNOSIS — K219 Gastro-esophageal reflux disease without esophagitis: Secondary | ICD-10-CM

## 2020-12-02 DIAGNOSIS — J8410 Pulmonary fibrosis, unspecified: Secondary | ICD-10-CM

## 2020-12-02 DIAGNOSIS — Z87891 Personal history of nicotine dependence: Secondary | ICD-10-CM

## 2020-12-02 DIAGNOSIS — I252 Old myocardial infarction: Secondary | ICD-10-CM

## 2020-12-02 DIAGNOSIS — I5022 Chronic systolic (congestive) heart failure: Secondary | ICD-10-CM

## 2020-12-02 DIAGNOSIS — J1282 Pneumonia due to coronavirus disease 2019: Secondary | ICD-10-CM | POA: Diagnosis not present

## 2020-12-02 DIAGNOSIS — I10 Essential (primary) hypertension: Secondary | ICD-10-CM

## 2020-12-02 DIAGNOSIS — I255 Ischemic cardiomyopathy: Secondary | ICD-10-CM

## 2020-12-02 DIAGNOSIS — U071 COVID-19: Secondary | ICD-10-CM | POA: Diagnosis not present

## 2020-12-02 DIAGNOSIS — R42 Dizziness and giddiness: Secondary | ICD-10-CM

## 2020-12-02 DIAGNOSIS — I7100 Dissection of unspecified site of aorta: Secondary | ICD-10-CM

## 2020-12-02 DIAGNOSIS — Z9981 Dependence on supplemental oxygen: Secondary | ICD-10-CM

## 2020-12-02 DIAGNOSIS — D649 Anemia, unspecified: Secondary | ICD-10-CM

## 2020-12-02 DIAGNOSIS — L309 Dermatitis, unspecified: Secondary | ICD-10-CM

## 2020-12-02 DIAGNOSIS — L720 Epidermal cyst: Secondary | ICD-10-CM

## 2020-12-02 DIAGNOSIS — E785 Hyperlipidemia, unspecified: Secondary | ICD-10-CM

## 2020-12-02 DIAGNOSIS — Z9181 History of falling: Secondary | ICD-10-CM

## 2020-12-02 NOTE — Telephone Encounter (Signed)
Plan of care signed and faxed back to [facility] at [phone]. Form sent for scanning.

## 2020-12-12 ENCOUNTER — Institutional Professional Consult (permissible substitution): Payer: Medicare Other | Admitting: Pulmonary Disease

## 2020-12-13 ENCOUNTER — Ambulatory Visit: Payer: Medicare Other | Admitting: Pulmonary Disease

## 2020-12-13 ENCOUNTER — Other Ambulatory Visit: Payer: Self-pay

## 2020-12-13 ENCOUNTER — Encounter: Payer: Self-pay | Admitting: Pulmonary Disease

## 2020-12-13 VITALS — BP 116/70 | HR 78 | Temp 97.2°F | Ht 67.0 in | Wt 136.5 lb

## 2020-12-13 DIAGNOSIS — U071 COVID-19: Secondary | ICD-10-CM | POA: Diagnosis not present

## 2020-12-13 DIAGNOSIS — J9600 Acute respiratory failure, unspecified whether with hypoxia or hypercapnia: Secondary | ICD-10-CM

## 2020-12-13 DIAGNOSIS — R0982 Postnasal drip: Secondary | ICD-10-CM

## 2020-12-13 DIAGNOSIS — J310 Chronic rhinitis: Secondary | ICD-10-CM

## 2020-12-13 MED ORDER — IPRATROPIUM BROMIDE 0.03 % NA SOLN: 2.0000 | Freq: Two times a day (BID) | NASAL | 12 refills | Status: DC

## 2020-12-13 NOTE — Progress Notes (Signed)
Synopsis: Referred in January 2022 for post-covid pneumonia  Subjective:   PATIENT ID: Christopher Hogan GENDER: male DOB: [birth year], MRN: [id number]   HPI  Chief Complaint  Patient presents with  . Consult    Referred by Dr. Larose Kells for history for COVID since 10/22/20. Has been dealing with SOB ever since. Was in the hospital back in early December for [place].    Christopher Hogan is a 87 year old male, former smoker who is referred to pulmonary clinic for shortness of breath after hospitalization for covid 19 pneumonia.   He was admitted 10/22/20 to 10/27/20 for acute hypoxemic respiratory failure. He did have some shortness of breath prior to covid but did not have issues with his daily activities. He was sent home on 2L of supplemental oxygen which he uses as needed. He complains of cough, sinus drainage and difficulty clearing the sputum. He is using flonase and azelastine. He has a flutter valve and incentive spiromter at home.   Past Medical History:  Diagnosis Date  . AAA (abdominal aortic aneurysm) ([place])    repaired 02/2002, renal ultrasound, Sept 2010: normal abdominal aorta, normal renal arteries   . Age-related macular degeneration, wet, left eye ([place])   . Allergic rhinitis   . Arthritis   . At risk for pulmonary fibrosis 06/25/2018  . CAD (coronary artery disease)    stable stress test 7/11  . CHF (congestive heart failure) ([place])   . Chronic systolic CHF (congestive heart failure) ([place]) 12/17/2013  . Coronary atherosclerosis 06/09/2007   H/O inferior wall MI- [facility]- 1989 Last stress test 2/17- inferior scar, no ischemia    . DEGENERATIVE JOINT DISEASE 03/17/2010   Qualifier: Diagnosis of  By: Dawson Bills    . Dyslipidemia 05/06/2007   LDL 59 Jan 2019     . Eczema 07/09/2013  . ED (erectile dysfunction)   . Epidermoid cyst of skin of chest 09/29/2018  . ERECTILE DYSFUNCTION 12/09/2007   Qualifier: Diagnosis of  By: Larose Kells MD, [place] hypertension 04/30/2007    Qualifier: Diagnosis of  By: Larose Kells MD, [place] GERD 07/21/2008   Qualifier: Diagnosis of  By: Larose Kells MD, [place] GERD (gastroesophageal reflux disease)   . Glaucoma and macular degeneration 12/31/2011  . Hyperlipidemia   . Hypertension   . Inguinal hernia 12/31/2011  . Insomnia 02/22/2018  . Ischemic cardiomyopathy 06/23/2018   Last EF 30-44% by My[facility] Feb 2017  . Left inguinal hernia 06/30/2018  . Myocardial infarction ([place]) 1989   interior wall infarction  . PCP notes >>>>>>>>>>>>>>>>>>>> 07/17/2016  . Pneumonia 2019  . Pre-operative cardiovascular examination 12/31/2011  . Rhinitis 04/20/2008   Qualifier: Diagnosis of  By: Larose Kells MD, [place] Vertigo 06/20/2015     Family History  Problem Relation Age of Onset  . Lung cancer Father   . CAD Other        mother?  . Kidney Stones Son   . Prostate cancer Neg Hx   . Diabetes Neg Hx   . Colon cancer Neg Hx      Social History   Socioeconomic History  . Marital status: Married    Spouse name: Not on file  . Number of children: 3  . Years of education: Not on file  . Highest education level: Not on file  Occupational History  . Occupation: retired     Fish farm manager: RETIRED  Tobacco Use  . Smoking  status: Former Research scientist (life sciences)  . Smokeless tobacco: Never Used  . Tobacco comment: quit in 1953  Vaping Use  . Vaping Use: Never used  Substance and Sexual Activity  . Alcohol use: No  . Drug use: No  . Sexual activity: Not Currently  Other Topics Concern  . Not on file  Social History Narrative   WWII veteran   Independent on his ADL     Lives w/ wife   Social Determinants of Health   Financial Resource Strain: Not on file  Food Insecurity: Not on file  Transportation Needs: Not on file  Physical Activity: Not on file  Stress: Not on file  Social Connections: Not on file  Intimate Partner Violence: Not on file     Allergies  Allergen Reactions  . Ace Inhibitors Cough  . Aspirin Nausea Only and Other (See Comments)    Full  strength only  . Spironolactone Rash     Outpatient Medications Prior to Visit  Medication Sig Dispense Refill  . acetaminophen (TYLENOL) 500 MG tablet Take 1,000 mg by mouth every 6 (six) hours as needed for mild pain.    Marland Kitchen albuterol (VENTOLIN HFA) 108 (90 Base) MCG/ACT inhaler Inhale 2 puffs into the lungs every 6 (six) hours as needed for wheezing or shortness of breath. 8 g 1  . azelastine (ASTELIN) 0.1 % nasal spray Place 1 spray into both nostrils 2 (two) times daily as needed for rhinitis. Use in each nostril as directed    . Dorzolamide HCl-Timolol Mal PF 22.3-6.8 MG/ML SOLN Place 1 drop into both eyes 2 (two) times daily.     . fenofibrate (TRICOR) 48 MG tablet Take 48 mg by mouth daily.    . fluticasone (FLONASE) 50 MCG/ACT nasal spray Place 1 spray into both nostrils daily. (Patient taking differently: Place 1 spray into both nostrils 2 (two) times daily.) 16 g 1  . HYDROcodone-acetaminophen (NORCO) 5-325 MG tablet 0.5-1 tablet po bid prn pain 30 tablet 0  . latanoprost (XALATAN) 0.005 % ophthalmic solution Place 1 drop into the right eye at bedtime.    Marland Kitchen loratadine (CLARITIN) 10 MG tablet Take 1 tablet (10 mg total) by mouth daily. 30 tablet 0  . melatonin 3 MG TABS tablet Take 3 mg by mouth at bedtime as needed (sleep).    . Multiple Vitamin (MULTIVITAMIN WITH MINERALS) TABS tablet Take 1 tablet by mouth daily.    . Multiple Vitamins-Minerals (PRESERVISION AREDS 2) CAPS Take 1 capsule by mouth 2 (two) times daily.    . nitroGLYCERIN (NITROSTAT) 0.4 MG SL tablet Place 1 tablet (0.4 mg total) under the tongue every 5 (five) minutes as needed for chest pain. 25 tablet 3  . pantoprazole (PROTONIX) 40 MG tablet Take 1 tablet (40 mg total) by mouth daily. 90 tablet 3  . senna-docusate (SENOKOT-S) 8.6-50 MG tablet Take 1 tablet by mouth 2 (two) times daily as needed for moderate constipation.    . simvastatin (ZOCOR) 10 MG tablet Take 1 tablet (10 mg total) by mouth at bedtime. 90 tablet 3   . tamsulosin (FLOMAX) 0.4 MG CAPS capsule Take 1 capsule (0.4 mg total) by mouth daily after supper. 90 capsule 3  . furosemide (LASIX) 20 MG tablet Take 1 tablet (20 mg total) by mouth daily. 30 tablet 0  . metoprolol succinate (TOPROL-XL) 50 MG 24 hr tablet Take 0.5 tablets (25 mg total) by mouth daily. Take with or immediately following a meal.     No facility-administered medications  prior to visit.    Review of Systems  Constitutional: Negative for chills, fever, malaise/fatigue and weight loss.  HENT: Positive for congestion. Negative for sinus pain and sore throat.   Eyes: Negative.   Respiratory: Positive for cough and shortness of breath. Negative for hemoptysis, sputum production and wheezing.   Cardiovascular: Negative for chest pain, palpitations, orthopnea, claudication and leg swelling.  Gastrointestinal: Negative for abdominal pain, heartburn, nausea and vomiting.  Genitourinary: Negative.   Musculoskeletal: Negative for joint pain and myalgias.  Skin: Negative for rash.  Neurological: Negative for weakness.  Endo/Heme/Allergies: Negative.   Psychiatric/Behavioral: Negative.       Objective:   Vitals:   12/13/20 1211  BP: 116/70  Pulse: 78  Temp: (!) 97.2 F (36.2 C)  TempSrc: Temporal  SpO2: 95%  Weight: 136 lb 8 oz (61.9 kg)  Height: 5\' 7"  (1.702 m)     Physical Exam Constitutional:      General: He is not in acute distress.    Appearance: He is not ill-appearing.  HENT:     Head: Normocephalic and atraumatic.  Eyes:     Extraocular Movements: Extraocular movements intact.     Conjunctiva/sclera: Conjunctivae normal.     Pupils: Pupils are equal, round, and reactive to light.  Cardiovascular:     Rate and Rhythm: Normal rate and regular rhythm.     Pulses: Normal pulses.     Heart sounds: Normal heart sounds. No murmur heard.   Abdominal:     General: Bowel sounds are normal.     Palpations: Abdomen is soft.  Musculoskeletal:     Right  lower leg: No edema.     Left lower leg: No edema.  Lymphadenopathy:     Cervical: No cervical adenopathy.  Skin:    General: Skin is warm and dry.  Neurological:     General: No focal deficit present.     Mental Status: He is alert.  Psychiatric:        Mood and Affect: Mood normal.        Behavior: Behavior normal.        Thought Content: Thought content normal.        Judgment: Judgment normal.    CBC    Component Value Date/Time   WBC 5.6 11/15/2020 1141   RBC 3.96 (L) 11/15/2020 1141   HGB 12.6 (L) 11/15/2020 1141   HCT 37.3 (L) 11/15/2020 1141   PLT 177.0 11/15/2020 1141   MCV 94.2 11/15/2020 1141   MCH 31.2 10/27/2020 0403   MCHC 33.7 11/15/2020 1141   RDW 16.6 (H) 11/15/2020 1141   LYMPHSABS 0.8 11/15/2020 1141   MONOABS 0.5 11/15/2020 1141   EOSABS 0.2 11/15/2020 1141   BASOSABS 0.1 11/15/2020 1141   BMP Latest Ref Rng & Units 11/15/2020 10/27/2020 10/26/2020  Glucose 70 - 99 mg/dL 110(H) 98 140(H)  BUN 6 - 23 mg/dL 15 26(H) 19  Creatinine 0.40 - 1.50 mg/dL 0.92 1.10 0.86  Sodium 135 - 145 mEq/L 134(L) 132(L) 131(L)  Potassium 3.5 - 5.1 mEq/L 4.0 4.1 4.4  Chloride 96 - 112 mEq/L 98 94(L) 95(L)  CO2 19 - 32 mEq/L 30 27 27   Calcium 8.4 - 10.5 mg/dL 8.5 8.2(L) 8.1(L)    Chest imaging: CTA Chest 10/22/20 1. No evidence of a pulmonary embolism. 2. Ground-glass lung opacities superimposed on chronic interstitial fibrotic changes. Suspect multifocal pneumonia versus is inflammation related to interstitial lung disease. 3. Aortic atherosclerosis.  PFT: No flowsheet data found.  Echo 09/17/20: 1. Left ventricular ejection fraction, by estimation, is 35 to 40%. The  left ventricle has moderately decreased function. The left ventricle  demonstrates regional wall motion abnormalities. There is hypokinesis of the left  ventricular, entire inferolateral wall. There is hypokinesis of the left  ventricular, basal-mid lateral wall.  2. Right ventricular systolic  function is normal. The right ventricular  size is normal.  3. The mitral valve is normal in structure. Trivial mitral valve  regurgitation. No evidence of mitral stenosis.  4. The aortic valve has an indeterminant number of cusps. Aortic valve  regurgitation is mild. Mild to moderate aortic valve  sclerosis/calcification is present, without any evidence of aortic  stenosis.  5. Aortic dilatation noted. There is mild dilatation of the aortic root,  measuring 40 mm. There is mild dilatation of the ascending aorta,  measuring 37 mm.  6. The inferior vena cava is normal in size with greater than 50%  respiratory variability, suggesting right atrial pressure of 3 mmHg.     Assessment & Plan:   Acute respiratory failure due to COVID-19 (HCC)  Post-nasal drip - Plan: ipratropium (ATROVENT) 0.03 % nasal spray  Chronic rhinitis  Discussion: Christopher Hogan is a 87 year old male, former smoker who is referred to pulmonary clinic for shortness of breath after hospitalization for covid 19 pneumonia.   His dyspnea is secondary to the covid 19 pneumonia and he potentially had a chronic compenent prior to the infection which is possibly related to his smoking history. We will consider repeat chest imaging and PFTs about 6 months after his initial infection.   On ambulatory O2 monitoring he dropped to 88% and had to stop walking due to dyspnea. He recovered immediately to 95% with rest. He is to continue to work with physical therapy at home.   He is to continue as needed albuterol for the shortness of breath.   He is to start ipratropium nasal spray at bedtime to help with sinus drainage and cough.   Follow up in 1 month.  Freda Jackson, MD [facility] Office: [phone]   See Amion for Pager Details      Current Outpatient Medications:  .  acetaminophen (TYLENOL) 500 MG tablet, Take 1,000 mg by mouth every 6 (six) hours as needed for mild pain., Disp: ,  Rfl:  .  albuterol (VENTOLIN HFA) 108 (90 Base) MCG/ACT inhaler, Inhale 2 puffs into the lungs every 6 (six) hours as needed for wheezing or shortness of breath., Disp: 8 g, Rfl: 1 .  azelastine (ASTELIN) 0.1 % nasal spray, Place 1 spray into both nostrils 2 (two) times daily as needed for rhinitis. Use in each nostril as directed, Disp: , Rfl:  .  Dorzolamide HCl-Timolol Mal PF 22.3-6.8 MG/ML SOLN, Place 1 drop into both eyes 2 (two) times daily. , Disp: , Rfl:  .  fenofibrate (TRICOR) 48 MG tablet, Take 48 mg by mouth daily., Disp: , Rfl:  .  fluticasone (FLONASE) 50 MCG/ACT nasal spray, Place 1 spray into both nostrils daily. (Patient taking differently: Place 1 spray into both nostrils 2 (two) times daily.), Disp: 16 g, Rfl: 1 .  HYDROcodone-acetaminophen (NORCO) 5-325 MG tablet, 0.5-1 tablet po bid prn pain, Disp: 30 tablet, Rfl: 0 .  ipratropium (ATROVENT) 0.03 % nasal spray, Place 2 sprays into both nostrils every 12 (twelve) hours., Disp: 30 mL, Rfl: 12 .  latanoprost (XALATAN) 0.005 % ophthalmic solution, Place 1 drop into the right eye at  bedtime., Disp: , Rfl:  .  loratadine (CLARITIN) 10 MG tablet, Take 1 tablet (10 mg total) by mouth daily., Disp: 30 tablet, Rfl: 0 .  melatonin 3 MG TABS tablet, Take 3 mg by mouth at bedtime as needed (sleep)., Disp: , Rfl:  .  Multiple Vitamin (MULTIVITAMIN WITH MINERALS) TABS tablet, Take 1 tablet by mouth daily., Disp: , Rfl:  .  Multiple Vitamins-Minerals (PRESERVISION AREDS 2) CAPS, Take 1 capsule by mouth 2 (two) times daily., Disp: , Rfl:  .  nitroGLYCERIN (NITROSTAT) 0.4 MG SL tablet, Place 1 tablet (0.4 mg total) under the tongue every 5 (five) minutes as needed for chest pain., Disp: 25 tablet, Rfl: 3 .  pantoprazole (PROTONIX) 40 MG tablet, Take 1 tablet (40 mg total) by mouth daily., Disp: 90 tablet, Rfl: 3 .  senna-docusate (SENOKOT-S) 8.6-50 MG tablet, Take 1 tablet by mouth 2 (two) times daily as needed for moderate constipation., Disp: ,  Rfl:  .  simvastatin (ZOCOR) 10 MG tablet, Take 1 tablet (10 mg total) by mouth at bedtime., Disp: 90 tablet, Rfl: 3 .  tamsulosin (FLOMAX) 0.4 MG CAPS capsule, Take 1 capsule (0.4 mg total) by mouth daily after supper., Disp: 90 capsule, Rfl: 3 .  furosemide (LASIX) 20 MG tablet, Take 1 tablet (20 mg total) by mouth daily., Disp: 30 tablet, Rfl: 3 .  metoprolol succinate (TOPROL-XL) 50 MG 24 hr tablet, Take 0.5 tablets (25 mg total) by mouth daily. Take with or immediately following a meal., Disp: 45 tablet, Rfl: 1

## 2020-12-13 NOTE — Patient Instructions (Addendum)
Start ipratropium nasal spray, 1 spray per nostril at bedtime. If you notice relief from sinus drainage and cough, then you can use as needed in the morning as well.   Continue albuterol inhaler use 1-2 puffs every 4-6 hours as needed for cough, shortness of breath, wheezing or chest tightness.  Continue flonase and azelastine nasal sprays.   Continue to work with home physical therapy

## 2020-12-19 ENCOUNTER — Telehealth: Payer: Self-pay | Admitting: Internal Medicine

## 2020-12-19 MED ORDER — METOPROLOL SUCCINATE ER 50 MG PO TB24: 25.0000 mg | ORAL_TABLET | Freq: Every day | ORAL | 1 refills | Status: DC

## 2020-12-19 NOTE — Telephone Encounter (Signed)
Rx mailed.

## 2020-12-19 NOTE — Telephone Encounter (Signed)
Patient would like a prescription sent to her address for the Metoprolol 25 MG they would like to bring the writing prescription to their next [place] appointment visit on 12/29/2020

## 2020-12-21 DIAGNOSIS — I252 Old myocardial infarction: Secondary | ICD-10-CM | POA: Diagnosis not present

## 2020-12-21 DIAGNOSIS — I351 Nonrheumatic aortic (valve) insufficiency: Secondary | ICD-10-CM | POA: Diagnosis not present

## 2020-12-21 DIAGNOSIS — I7100 Dissection of unspecified site of aorta: Secondary | ICD-10-CM | POA: Diagnosis not present

## 2020-12-21 DIAGNOSIS — M5430 Sciatica, unspecified side: Secondary | ICD-10-CM | POA: Diagnosis not present

## 2020-12-21 DIAGNOSIS — H353220 Exudative age-related macular degeneration, left eye, stage unspecified: Secondary | ICD-10-CM | POA: Diagnosis not present

## 2020-12-21 DIAGNOSIS — M1990 Unspecified osteoarthritis, unspecified site: Secondary | ICD-10-CM | POA: Diagnosis not present

## 2020-12-21 DIAGNOSIS — D649 Anemia, unspecified: Secondary | ICD-10-CM | POA: Diagnosis not present

## 2020-12-21 DIAGNOSIS — J1282 Pneumonia due to coronavirus disease 2019: Secondary | ICD-10-CM | POA: Diagnosis not present

## 2020-12-21 DIAGNOSIS — H409 Unspecified glaucoma: Secondary | ICD-10-CM | POA: Diagnosis not present

## 2020-12-21 DIAGNOSIS — J9601 Acute respiratory failure with hypoxia: Secondary | ICD-10-CM | POA: Diagnosis not present

## 2020-12-21 DIAGNOSIS — J8410 Pulmonary fibrosis, unspecified: Secondary | ICD-10-CM | POA: Diagnosis not present

## 2020-12-21 DIAGNOSIS — I700 Atherosclerosis of aorta: Secondary | ICD-10-CM | POA: Diagnosis not present

## 2020-12-21 DIAGNOSIS — I5022 Chronic systolic (congestive) heart failure: Secondary | ICD-10-CM | POA: Diagnosis not present

## 2020-12-21 DIAGNOSIS — I110 Hypertensive heart disease with heart failure: Secondary | ICD-10-CM | POA: Diagnosis not present

## 2020-12-21 DIAGNOSIS — U071 COVID-19: Secondary | ICD-10-CM | POA: Diagnosis not present

## 2020-12-21 DIAGNOSIS — I2510 Atherosclerotic heart disease of native coronary artery without angina pectoris: Secondary | ICD-10-CM | POA: Diagnosis not present

## 2020-12-22 ENCOUNTER — Telehealth: Payer: Self-pay | Admitting: Internal Medicine

## 2020-12-22 ENCOUNTER — Other Ambulatory Visit: Payer: Self-pay

## 2020-12-22 MED ORDER — FUROSEMIDE 20 MG PO TABS: 20.0000 mg | ORAL_TABLET | Freq: Every day | ORAL | 3 refills | Status: DC

## 2020-12-22 NOTE — Progress Notes (Signed)
I sent a refill

## 2020-12-22 NOTE — Progress Notes (Signed)
Pt calling for refill for furosemide, it seems that it was given to him at the hospital on 09/20/20. Is ok to refill?

## 2020-12-22 NOTE — Telephone Encounter (Signed)
Medication: furosemide (LASIX) 20 MG tablet    Has the patient contacted their pharmacy? No. (If no, request that the patient contact the pharmacy for the refill.) (If yes, when and what did the pharmacy advise?)  Preferred Pharmacy (with phone number or street name):  [id number] - [place], [place] - [address], [address] AT [place] Phone:  [phone]  Fax:  [phone]       Agent: Please be advised that RX refills may take up to 3 business days. We ask that you follow-up with your pharmacy.

## 2020-12-23 ENCOUNTER — Telehealth: Payer: Self-pay | Admitting: Internal Medicine

## 2020-12-23 DIAGNOSIS — I5022 Chronic systolic (congestive) heart failure: Secondary | ICD-10-CM | POA: Diagnosis not present

## 2020-12-23 DIAGNOSIS — J9691 Respiratory failure, unspecified with hypoxia: Secondary | ICD-10-CM | POA: Diagnosis not present

## 2020-12-23 DIAGNOSIS — J8410 Pulmonary fibrosis, unspecified: Secondary | ICD-10-CM | POA: Diagnosis not present

## 2020-12-23 NOTE — Telephone Encounter (Signed)
Patient  reported a fall no injury

## 2020-12-23 NOTE — Telephone Encounter (Signed)
Gerald Stabs would like to continue PT with  Patient and needs verbal orders   Frequency :  1*2   Marcelle Overlie  Call back [phone]

## 2020-12-23 NOTE — Telephone Encounter (Signed)
Called Gerald Stabs at [facility] to ok verbal orders for PT. -JMA

## 2020-12-23 NOTE — Telephone Encounter (Signed)
Noted  

## 2020-12-29 ENCOUNTER — Encounter: Payer: Self-pay | Admitting: Pulmonary Disease

## 2021-01-02 ENCOUNTER — Encounter (INDEPENDENT_AMBULATORY_CARE_PROVIDER_SITE_OTHER): Payer: Medicare Other | Admitting: Ophthalmology

## 2021-01-04 ENCOUNTER — Ambulatory Visit: Payer: Medicare Other | Admitting: Internal Medicine

## 2021-01-09 NOTE — Progress Notes (Unsigned)
Cardiology Office Note    Date:  01/11/2021   ID:  [name], DOB [birth year], MRN [id number]   PCP:  Colon Branch, [place]  Cardiologist:  Ena Dawley, MD  Advanced Practice Provider:  No care team member to display Electrophysiologist:  None   :[id number]}   No chief complaint on file.   History of Present Illness:  Christopher Hogan is a 87 y.o. male with history of CAD status post remote inferior MI in 1989 treated with PTCA, Myoview 2017 inferior scar no ischemia, ischemic cardiomyopathy, hypertension, HLD, AAA repair 2003.  Patient was hospitalized with acute resp failure secondary to  CAP 08/2020 TTE EF 35 to 40% with R WMA with hypokinesis of the inferolateral wall and basal mid lateral wall.  Recent trouble with hypotension and Imdur and  Losartan stopped.  Patient comes in accompanied by his wife. BP usually 110/69 at home.Bronchopneumonia treated at [facility] with Doxycycline yest. He was wheezing and short of breath and hasn't improved.DOE is chronic and unchanged. Uses O2 at night. He complains of quick pain in his esophagus associated with dizziness. Lasts a few seconds. Occurs 2-3 times a week, no worse with activity. Can't describe well. No chest tightness, no trouble walking in here today. Usually watches salt but ate Chinese Sunday. Has a lot of gas that has worsened in the past few months.         Past Medical History:  Diagnosis Date  . AAA (abdominal aortic aneurysm) ([place])    repaired 02/2002, renal ultrasound, Sept 2010: normal abdominal aorta, normal renal arteries   . Age-related macular degeneration, wet, left eye ([place])   . Allergic rhinitis   . Arthritis   . At risk for pulmonary fibrosis 06/25/2018  . CAD (coronary artery disease)    stable stress test 7/11  . CHF (congestive heart failure) ([place])   . Chronic systolic CHF (congestive heart failure) ([place]) 12/17/2013  . Coronary atherosclerosis 06/09/2007   H/O  inferior wall MI- PTCA- 1989 Last stress test 2/17- inferior scar, no ischemia    . DEGENERATIVE JOINT DISEASE 03/17/2010   Qualifier: Diagnosis of  By: Dawson Bills    . Dyslipidemia 05/06/2007   LDL 59 Jan 2019     . Eczema 07/09/2013  . ED (erectile dysfunction)   . Epidermoid cyst of skin of chest 09/29/2018  . ERECTILE DYSFUNCTION 12/09/2007   Qualifier: Diagnosis of  By: Larose Kells MD, [place] hypertension 04/30/2007   Qualifier: Diagnosis of  By: Larose Kells MD, [place] GERD 07/21/2008   Qualifier: Diagnosis of  By: Larose Kells MD, [place] GERD (gastroesophageal reflux disease)   . Glaucoma and macular degeneration 12/31/2011  . Hyperlipidemia   . Hypertension   . Inguinal hernia 12/31/2011  . Insomnia 02/22/2018  . Ischemic cardiomyopathy 06/23/2018   Last EF 30-44% by Myoview Feb 2017  . Left inguinal hernia 06/30/2018  . Myocardial infarction ([place]) 1989   interior wall infarction  . PCP notes >>>>>>>>>>>>>>>>>>>> 07/17/2016  . Pneumonia 2019  . Pre-operative cardiovascular examination 12/31/2011  . Rhinitis 04/20/2008   Qualifier: Diagnosis of  By: Larose Kells MD, [place] Vertigo 06/20/2015    Past Surgical History:  Procedure Laterality Date  . AAA repair  2003  . CARDIAC CATHETERIZATION    . CHOLECYSTECTOMY    . INGUINAL HERNIA REPAIR Right 1985  . INGUINAL HERNIA REPAIR  Left 06/30/2018   open; w/mesh  . INGUINAL HERNIA REPAIR Left 06/30/2018   Procedure: OPEN LEFT INGUINAL HERNIA REPAIR;  Surgeon: Fanny Skates, MD;  Location: [place];  Service: General;  Laterality: Left;  . INSERTION OF MESH Left 06/30/2018   Procedure: INSERTION OF MESH;  Surgeon: Fanny Skates, MD;  Location: [place];  Service: General;  Laterality: Left;  Marland Kitchen MASS EXCISION N/A 09/29/2018   Procedure: EXCISION OF 3 CM MASS ANTERIOR CHEST WALL;  Surgeon: Fanny Skates, MD;  Location: [place];  Service: General;  Laterality: N/A;  . PTCA  1989, 1992    Current Medications: Current Meds   Medication Sig  . acetaminophen (TYLENOL) 500 MG tablet Take 1,000 mg by mouth every 6 (six) hours as needed for mild pain.  Marland Kitchen albuterol (VENTOLIN HFA) 108 (90 Base) MCG/ACT inhaler Inhale 2 puffs into the lungs every 6 (six) hours as needed for wheezing or shortness of breath.  Marland Kitchen azelastine (ASTELIN) 0.1 % nasal spray Place 1 spray into both nostrils 2 (two) times daily as needed for rhinitis. Use in each nostril as directed  . Dorzolamide HCl-Timolol Mal PF 22.3-6.8 MG/ML SOLN Place 1 drop into both eyes 2 (two) times daily.   . fenofibrate (TRICOR) 48 MG tablet Take 48 mg by mouth daily.  . fluticasone (FLONASE) 50 MCG/ACT nasal spray Place 1 spray into both nostrils daily. (Patient taking differently: Place 1 spray into both nostrils 2 (two) times daily.)  . furosemide (LASIX) 20 MG tablet Take 1 tablet (20 mg total) by mouth daily.  Marland Kitchen HYDROcodone-acetaminophen (NORCO) 5-325 MG tablet 0.5-1 tablet po bid prn pain  . ipratropium (ATROVENT) 0.03 % nasal spray Place 2 sprays into both nostrils every 12 (twelve) hours.  Marland Kitchen latanoprost (XALATAN) 0.005 % ophthalmic solution Place 1 drop into the right eye at bedtime.  Marland Kitchen loratadine (CLARITIN) 10 MG tablet Take 1 tablet (10 mg total) by mouth daily.  . melatonin 3 MG TABS tablet Take 3 mg by mouth at bedtime as needed (sleep).  . metoprolol succinate (TOPROL-XL) 50 MG 24 hr tablet Take 0.5 tablets (25 mg total) by mouth daily. Take with or immediately following a meal.  . Multiple Vitamin (MULTIVITAMIN WITH MINERALS) TABS tablet Take 1 tablet by mouth daily.  . Multiple Vitamins-Minerals (PRESERVISION AREDS 2) CAPS Take 1 capsule by mouth 2 (two) times daily.  . nitroGLYCERIN (NITROSTAT) 0.4 MG SL tablet Place 1 tablet (0.4 mg total) under the tongue every 5 (five) minutes as needed for chest pain.  . pantoprazole (PROTONIX) 40 MG tablet Take 1 tablet (40 mg total) by mouth daily.  Marland Kitchen senna-docusate (SENOKOT-S) 8.6-50 MG tablet Take 1 tablet by mouth 2  (two) times daily as needed for moderate constipation.  . simvastatin (ZOCOR) 10 MG tablet Take 1 tablet (10 mg total) by mouth at bedtime.  . tamsulosin (FLOMAX) 0.4 MG CAPS capsule Take 1 capsule (0.4 mg total) by mouth daily after supper.     Allergies:   Ace inhibitors, Aspirin, and Spironolactone   Social History   Socioeconomic History  . Marital status: Married    Spouse name: Not on file  . Number of children: 3  . Years of education: Not on file  . Highest education level: Not on file  Occupational History  . Occupation: retired     Fish farm manager: RETIRED  Tobacco Use  . Smoking status: Former Research scientist (life sciences)  . Smokeless tobacco: Never Used  . Tobacco comment: quit in [place]  Use  . Vaping Use: Never used  Substance and Sexual Activity  . Alcohol use: No  . Drug use: No  . Sexual activity: Not Currently  Other Topics Concern  . Not on file  Social History Narrative   WWII veteran   Independent on his ADL     Lives w/ wife   Social Determinants of Health   Financial Resource Strain: Not on file  Food Insecurity: Not on file  Transportation Needs: Not on file  Physical Activity: Not on file  Stress: Not on file  Social Connections: Not on file     Family History:  The patient's family history includes CAD in an other family member; Kidney Stones in his son; Lung cancer in his father.   ROS:   Please see the history of present illness.    ROS All other systems reviewed and are negative.   PHYSICAL EXAM:   VS:  BP (!) 144/66   Pulse (!) 47   Ht 5\' 7"  (1.702 m)   Wt 133 lb (60.3 kg)   SpO2 90%   BMI 20.83 kg/m   Physical Exam  GEN: Thin, elderly in no acute distress  Neck: no JVD, carotid bruits, or masses Cardiac:RRR; no murmurs, rubs, or gallops  Respiratory:  clear to auscultation bilaterally, normal work of breathing GI: soft, nontender, nondistended, + BS Ext: without cyanosis, clubbing, or edema, Good distal pulses bilaterally Neuro:  Alert and  Oriented x 3 Psych: euthymic mood, full affect  Wt Readings from Last 3 Encounters:  01/11/21 133 lb (60.3 kg)  12/13/20 136 lb 8 oz (61.9 kg)  11/15/20 134 lb 6 oz (61 kg)      Studies/Labs Reviewed:   EKG:  EKG is not ordered today.  T  Recent Labs: 09/18/2020: TSH 1.535 09/20/2020: Magnesium 2.0 10/22/2020: B Natriuretic Peptide 94.4 11/15/2020: ALT 12; BUN 15; Creatinine, Ser 0.92; Hemoglobin 12.6; Platelets 177.0; Potassium 4.0; Sodium 134   Lipid Panel    Component Value Date/Time   CHOL 109 10/28/2019 1056   TRIG 152.0 (H) 10/28/2019 1056   HDL 38.80 (L) 10/28/2019 1056   CHOLHDL 3 10/28/2019 1056   VLDL 30.4 10/28/2019 1056   LDLCALC 39 10/28/2019 1056   LDLDIRECT 57.3 07/29/2014 0820    Additional studies/ records that were reviewed today include:   Echo 09/17/2020 1. Left ventricular ejection fraction, by estimation, is 35 to 40%. The  left ventricle has moderately decreased function. The left ventricle  demonstrates regional wall motion abnormalities (see scoring  diagram/findings for description). Left ventricular   diastolic parameters are indeterminate. There is hypokinesis of the left  ventricular, entire inferolateral wall. There is hypokinesis of the left  ventricular, basal-mid lateral wall.   2. Right ventricular systolic function is normal. The right ventricular  size is normal.   3. The mitral valve is normal in structure. Trivial mitral valve  regurgitation. No evidence of mitral stenosis.   4. The aortic valve has an indeterminant number of cusps. Aortic valve  regurgitation is mild. Mild to moderate aortic valve  sclerosis/calcification is present, without any evidence of aortic  stenosis.   5. Aortic dilatation noted. There is mild dilatation of the aortic root,  measuring 40 mm. There is mild dilatation of the ascending aorta,  measuring 37 mm.   6. The inferior vena cava is normal in size with greater than 50%  respiratory variability,  suggesting right atrial pressure of 3 mmHg.   7. Recommend limited echo with  definity for better assessmen of wall  motion as anterior wall not well visualized in apical and parasternal  short axis views.     Risk Assessment/Calculations:         ASSESSMENT:    1. Coronary artery disease involving native coronary artery of native heart without angina pectoris   2. Chronic systolic CHF (congestive heart failure) ([place])   3. Essential hypertension      PLAN:  In order of problems listed above:  CAD status post remote inferior MI 1989 treated with PTCA, having some chest pain difficult for him to describe and not exertional related. Also has a lot of gas. Will try to add back low dose Imdur 15 mg daily. Wants to f/u in [facility] since they live over there.  Chronic systolic CHF Echo 88/5027 EF 35 to 40% with grade 2 DD Imdur and losartan stopped because of hypotension, compensated  Hypertension but recent hypotension, still having dizziness when stands up. Will make lasix prn weight gain 2-3 lbs overnight, 5 lbs in a week, leg edema.   Shared Decision Making/Informed Consent        Medication Adjustments/Labs and Tests Ordered: Current medicines are reviewed at length with the patient today.  Concerns regarding medicines are outlined above.  Medication changes, Labs and Tests ordered today are listed in the Patient Instructions below. There are no Patient Instructions on file for this visit.   Christopher Boast, PA-C  01/11/2021 1:45 PM    [facility] [address], [place], [place]  [address] Phone: [phone]; Fax: [phone]

## 2021-01-11 ENCOUNTER — Other Ambulatory Visit: Payer: Self-pay

## 2021-01-11 ENCOUNTER — Encounter: Payer: Self-pay | Admitting: Physician Assistant

## 2021-01-11 ENCOUNTER — Ambulatory Visit: Payer: Medicare Other | Admitting: Physician Assistant

## 2021-01-11 VITALS — BP 144/66 | HR 47 | Ht 67.0 in | Wt 133.0 lb

## 2021-01-11 DIAGNOSIS — I2510 Atherosclerotic heart disease of native coronary artery without angina pectoris: Secondary | ICD-10-CM | POA: Diagnosis not present

## 2021-01-11 DIAGNOSIS — I10 Essential (primary) hypertension: Secondary | ICD-10-CM | POA: Diagnosis not present

## 2021-01-11 DIAGNOSIS — I5022 Chronic systolic (congestive) heart failure: Secondary | ICD-10-CM | POA: Diagnosis not present

## 2021-01-11 MED ORDER — ISOSORBIDE MONONITRATE ER 30 MG PO TB24: 15.0000 mg | ORAL_TABLET | Freq: Every day | ORAL | 3 refills | Status: DC

## 2021-01-11 MED ORDER — FUROSEMIDE 20 MG PO TABS
20.0000 mg | ORAL_TABLET | ORAL | 3 refills | Status: DC | PRN
Start: 2021-01-11 — End: 2021-02-14

## 2021-01-11 NOTE — Patient Instructions (Signed)
Medication Instructions:  Your physician has recommended you make the following change in your medication:   CHANGE: Furosemide 20mg  as needed for leg swelling, weight gain of 2-3lbs overnight or 5lbs in 1 week. START: Isosorbide 15mg  daily   *If you need a refill on your cardiac medications before your next appointment, please call your pharmacy*   Lab Work: None today If you have labs (blood work) drawn today and your tests are completely normal, you will receive your results only by: Marland Kitchen MyChart Message (if you have MyChart) OR . A paper copy in the mail If you have any lab test that is abnormal or we need to change your treatment, we will call you to review the results.   Follow-Up: At [facility], you and your health needs are our priority.  As part of our continuing mission to provide you with exceptional heart care, we have created designated Provider Care Teams.  These Care Teams include your primary Cardiologist (physician) and Advanced Practice Providers (APPs -  Physician Assistants and Nurse Practitioners) who all work together to provide you with the care you need, when you need it.  Your next appointment:   01/27/2021  The format for your next appointment:   In Person  Provider:   Jenne Campus, MD

## 2021-01-12 ENCOUNTER — Encounter (INDEPENDENT_AMBULATORY_CARE_PROVIDER_SITE_OTHER): Payer: Medicare Other | Admitting: Ophthalmology

## 2021-01-12 DIAGNOSIS — H43813 Vitreous degeneration, bilateral: Secondary | ICD-10-CM | POA: Diagnosis not present

## 2021-01-12 DIAGNOSIS — H353231 Exudative age-related macular degeneration, bilateral, with active choroidal neovascularization: Secondary | ICD-10-CM | POA: Diagnosis not present

## 2021-01-12 DIAGNOSIS — I10 Essential (primary) hypertension: Secondary | ICD-10-CM | POA: Diagnosis not present

## 2021-01-12 DIAGNOSIS — H35033 Hypertensive retinopathy, bilateral: Secondary | ICD-10-CM | POA: Diagnosis not present

## 2021-01-17 ENCOUNTER — Ambulatory Visit: Payer: Medicare Other | Admitting: Pulmonary Disease

## 2021-01-23 DIAGNOSIS — I714 Abdominal aortic aneurysm, without rupture, unspecified: Secondary | ICD-10-CM | POA: Insufficient documentation

## 2021-01-23 DIAGNOSIS — M1990 Unspecified osteoarthritis, unspecified site: Secondary | ICD-10-CM | POA: Insufficient documentation

## 2021-01-23 DIAGNOSIS — I252 Old myocardial infarction: Secondary | ICD-10-CM | POA: Insufficient documentation

## 2021-01-23 DIAGNOSIS — H353220 Exudative age-related macular degeneration, left eye, stage unspecified: Secondary | ICD-10-CM | POA: Insufficient documentation

## 2021-01-23 DIAGNOSIS — N529 Male erectile dysfunction, unspecified: Secondary | ICD-10-CM | POA: Insufficient documentation

## 2021-01-23 DIAGNOSIS — I509 Heart failure, unspecified: Secondary | ICD-10-CM | POA: Insufficient documentation

## 2021-01-23 DIAGNOSIS — K219 Gastro-esophageal reflux disease without esophagitis: Secondary | ICD-10-CM | POA: Insufficient documentation

## 2021-01-23 DIAGNOSIS — H9190 Unspecified hearing loss, unspecified ear: Secondary | ICD-10-CM

## 2021-01-23 DIAGNOSIS — E785 Hyperlipidemia, unspecified: Secondary | ICD-10-CM | POA: Insufficient documentation

## 2021-01-23 DIAGNOSIS — H401190 Primary open-angle glaucoma, unspecified eye, stage unspecified: Secondary | ICD-10-CM

## 2021-01-23 DIAGNOSIS — I10 Essential (primary) hypertension: Secondary | ICD-10-CM | POA: Insufficient documentation

## 2021-01-23 DIAGNOSIS — I2510 Atherosclerotic heart disease of native coronary artery without angina pectoris: Secondary | ICD-10-CM | POA: Insufficient documentation

## 2021-01-23 HISTORY — DX: Old myocardial infarction: I25.2

## 2021-01-23 HISTORY — DX: Primary open-angle glaucoma, unspecified eye, stage unspecified: H40.1190

## 2021-01-23 HISTORY — DX: Unspecified hearing loss, unspecified ear: H91.90

## 2021-01-27 ENCOUNTER — Other Ambulatory Visit: Payer: Self-pay

## 2021-01-27 ENCOUNTER — Ambulatory Visit (HOSPITAL_BASED_OUTPATIENT_CLINIC_OR_DEPARTMENT_OTHER)
Admission: RE | Admit: 2021-01-27 | Discharge: 2021-01-27 | Disposition: A | Discharge status: Home / Self Care | Payer: Medicare Other | Source: Ambulatory Visit | Attending: Cardiology | Admitting: Cardiology

## 2021-01-27 ENCOUNTER — Ambulatory Visit: Payer: Medicare Other | Admitting: Cardiology

## 2021-01-27 ENCOUNTER — Encounter: Payer: Self-pay | Admitting: Cardiology

## 2021-01-27 VITALS — BP 96/50 | HR 64 | Ht 68.0 in | Wt 135.0 lb

## 2021-01-27 DIAGNOSIS — E785 Hyperlipidemia, unspecified: Secondary | ICD-10-CM

## 2021-01-27 DIAGNOSIS — I5022 Chronic systolic (congestive) heart failure: Secondary | ICD-10-CM | POA: Diagnosis not present

## 2021-01-27 DIAGNOSIS — I517 Cardiomegaly: Secondary | ICD-10-CM | POA: Diagnosis not present

## 2021-01-27 DIAGNOSIS — I10 Essential (primary) hypertension: Secondary | ICD-10-CM | POA: Diagnosis not present

## 2021-01-27 DIAGNOSIS — J90 Pleural effusion, not elsewhere classified: Secondary | ICD-10-CM | POA: Diagnosis not present

## 2021-01-27 DIAGNOSIS — R0602 Shortness of breath: Secondary | ICD-10-CM | POA: Diagnosis not present

## 2021-01-27 DIAGNOSIS — J8410 Pulmonary fibrosis, unspecified: Secondary | ICD-10-CM | POA: Diagnosis not present

## 2021-01-27 DIAGNOSIS — R0600 Dyspnea, unspecified: Secondary | ICD-10-CM

## 2021-01-27 DIAGNOSIS — R0609 Other forms of dyspnea: Secondary | ICD-10-CM

## 2021-01-27 DIAGNOSIS — I255 Ischemic cardiomyopathy: Secondary | ICD-10-CM | POA: Diagnosis not present

## 2021-01-27 NOTE — Patient Instructions (Signed)
Medication Instructions:  Your physician recommends that you continue on your current medications as directed. Please refer to the Current Medication list given to you today.    *If you need a refill on your cardiac medications before your next appointment, please call your pharmacy*   Lab Work: Your physician recommends that you return for lab work today: bmp pro bnp  If you have labs (blood work) drawn today and your tests are completely normal, you will receive your results only by: Marland Kitchen MyChart Message (if you have MyChart) OR . A paper copy in the mail If you have any lab test that is abnormal or we need to change your treatment, we will call you to review the results.   Testing/Procedures: A chest x-ray takes a picture of the organs and structures inside the chest, including the heart, lungs, and blood vessels. This test can show several things, including, whether the heart is enlarges; whether fluid is building up in the lungs; and whether pacemaker / defibrillator leads are still in place.    Follow-Up: At [facility], you and your health needs are our priority.  As part of our continuing mission to provide you with exceptional heart care, we have created designated Provider Care Teams.  These Care Teams include your primary Cardiologist (physician) and Advanced Practice Providers (APPs -  Physician Assistants and Nurse Practitioners) who all work together to provide you with the care you need, when you need it.  We recommend signing up for the patient portal called "MyChart".  Sign up information is provided on this After Visit Summary.  MyChart is used to connect with patients for Virtual Visits (Telemedicine).  Patients are able to view lab/test results, encounter notes, upcoming appointments, etc.  Non-urgent messages can be sent to your provider as well.   To learn more about what you can do with MyChart, go to [email or web address].    Your next appointment:   6  week(s)  The format for your next appointment:   In Person  Provider:   Jenne Campus, MD   Other Instructions

## 2021-01-27 NOTE — Progress Notes (Signed)
Cardiology Office Note:    Date:  01/27/2021   ID:  Christopher Hogan, DOB Jan 13, 1925, MRN [id number]  PCP:  Colon Branch, MD  Cardiologist:  Jenne Campus, MD    Referring MD: Colon Branch, MD   Chief Complaint  Patient presents with  . Follow-up    History of Present Illness:    Christopher Hogan is a 87 y.o. male a live gentleman with past medical history significant for coronary artery disease, in the 90s he suffered from inferior wall myocardial infarction.  It left him with diminished ejection fraction neighborhood of 35 to 40%, also recently suffered from pneumonia required antibiotic therapy after that COVID-19 infection.  His past medical history also significant for abdominal aortic aneurysm , Essential hypertension. He comes to our office and would like to be establish as a patient.  Overall doing relatively poorly he complained of being weak tired exhausted as well as some shortness of breath.  On top of that he complained of having some esophageal spasm.  He was given small dose of long-acting nitroglycerin only 50 mg daily that seems to be helping.  He does not do much she is majority of time sitting and watching TV.  He comes to our office with his wife.  Past Medical History:  Diagnosis Date  . AAA (abdominal aortic aneurysm) ([place])    repaired 02/2002, renal ultrasound, Sept 2010: normal abdominal aorta, normal renal arteries   . Age-related macular degeneration, wet, left eye ([place])   . Allergic rhinitis   . Arthritis   . At risk for pulmonary fibrosis 06/25/2018  . CAD (coronary artery disease)    stable stress test 7/11  . CHF (congestive heart failure) ([place])   . Chronic systolic CHF (congestive heart failure) ([place]) 12/17/2013  . Coronary atherosclerosis 06/09/2007   H/O inferior wall MI- PTCA- 1989 Last stress test 2/17- inferior scar, no ischemia    . DEGENERATIVE JOINT DISEASE 03/17/2010   Qualifier: Diagnosis of  By: Dawson Bills    . Dyslipidemia 05/06/2007   LDL  59 Jan 2019     . Eczema 07/09/2013  . ED (erectile dysfunction)   . Epidermoid cyst of skin of chest 09/29/2018  . ERECTILE DYSFUNCTION 12/09/2007   Qualifier: Diagnosis of  By: Larose Kells MD, [place] hypertension 04/30/2007   Qualifier: Diagnosis of  By: Larose Kells MD, [place] GERD 07/21/2008   Qualifier: Diagnosis of  By: Larose Kells MD, [place] GERD (gastroesophageal reflux disease)   . Glaucoma and macular degeneration 12/31/2011  . Hyperlipidemia   . Hypertension   . Inguinal hernia 12/31/2011  . Insomnia 02/22/2018  . Ischemic cardiomyopathy 06/23/2018   Last EF 30-44% by Myoview Feb 2017  . Left inguinal hernia 06/30/2018  . Myocardial infarction ([place]) 1989   interior wall infarction  . PCP notes >>>>>>>>>>>>>>>>>>>> 07/17/2016  . Pneumonia 2019  . Pre-operative cardiovascular examination 12/31/2011  . Rhinitis 04/20/2008   Qualifier: Diagnosis of  By: Larose Kells MD, [place] Vertigo 06/20/2015    Past Surgical History:  Procedure Laterality Date  . AAA repair  2003  . CARDIAC CATHETERIZATION    . CHOLECYSTECTOMY    . INGUINAL HERNIA REPAIR Right 1985  . INGUINAL HERNIA REPAIR Left 06/30/2018   open; w/mesh  . INGUINAL HERNIA REPAIR Left 06/30/2018   Procedure: OPEN LEFT INGUINAL HERNIA REPAIR;  Surgeon: Fanny Skates, MD;  Location: [facility];  Service:  General;  Laterality: Left;  . INSERTION OF MESH Left 06/30/2018   Procedure: INSERTION OF MESH;  Surgeon: Fanny Skates, MD;  Location: [facility];  Service: General;  Laterality: Left;  Marland Kitchen MASS EXCISION N/A 09/29/2018   Procedure: EXCISION OF 3 CM MASS ANTERIOR CHEST WALL;  Surgeon: Fanny Skates, MD;  Location: [facility];  Service: General;  Laterality: N/A;  . PTCA  1989, 1992    Current Medications: Current Meds  Medication Sig  . acetaminophen (TYLENOL) 500 MG tablet Take 1,000 mg by mouth every 6 (six) hours as needed for mild pain.  Marland Kitchen albuterol (VENTOLIN HFA) 108 (90 Base) MCG/ACT inhaler Inhale 2 puffs into the  lungs every 6 (six) hours as needed for wheezing or shortness of breath.  Marland Kitchen azelastine (ASTELIN) 0.1 % nasal spray Place 1 spray into both nostrils 2 (two) times daily as needed for rhinitis. Use in each nostril as directed  . Dorzolamide HCl-Timolol Mal PF 22.3-6.8 MG/ML SOLN Place 1 drop into both eyes 2 (two) times daily.   . fenofibrate (TRICOR) 48 MG tablet Take 48 mg by mouth daily.  . fluticasone (FLONASE) 50 MCG/ACT nasal spray Place 1 spray into both nostrils daily. (Patient taking differently: Place 1 spray into both nostrils 2 (two) times daily.)  . furosemide (LASIX) 20 MG tablet Take 1 tablet (20 mg total) by mouth as needed for edema.  Marland Kitchen HYDROcodone-acetaminophen (NORCO) 5-325 MG tablet 0.5-1 tablet po bid prn pain  . ipratropium (ATROVENT) 0.03 % nasal spray Place 2 sprays into both nostrils every 12 (twelve) hours.  . isosorbide mononitrate (IMDUR) 30 MG 24 hr tablet Take 0.5 tablets (15 mg total) by mouth daily.  Marland Kitchen latanoprost (XALATAN) 0.005 % ophthalmic solution Place 1 drop into the right eye at bedtime.  Marland Kitchen loratadine (CLARITIN) 10 MG tablet Take 1 tablet (10 mg total) by mouth daily. (Patient taking differently: Take 10 mg by mouth daily as needed for allergies.)  . melatonin 3 MG TABS tablet Take 3 mg by mouth at bedtime as needed (sleep).  . Multiple Vitamin (MULTIVITAMIN WITH MINERALS) TABS tablet Take 1 tablet by mouth daily.  . Multiple Vitamins-Minerals (PRESERVISION AREDS 2) CAPS Take 1 capsule by mouth 2 (two) times daily.  . nitroGLYCERIN (NITROSTAT) 0.4 MG SL tablet Place 1 tablet (0.4 mg total) under the tongue every 5 (five) minutes as needed for chest pain.  . pantoprazole (PROTONIX) 40 MG tablet Take 1 tablet (40 mg total) by mouth daily.  Marland Kitchen senna-docusate (SENOKOT-S) 8.6-50 MG tablet Take 1 tablet by mouth 2 (two) times daily as needed for moderate constipation.  . simvastatin (ZOCOR) 10 MG tablet Take 1 tablet (10 mg total) by mouth at bedtime.  . tamsulosin  (FLOMAX) 0.4 MG CAPS capsule Take 1 capsule (0.4 mg total) by mouth daily after supper.     Allergies:   Ace inhibitors, Aspirin, and Spironolactone   Social History   Socioeconomic History  . Marital status: Married    Spouse name: Not on file  . Number of children: 3  . Years of education: Not on file  . Highest education level: Not on file  Occupational History  . Occupation: retired     Fish farm manager: RETIRED  Tobacco Use  . Smoking status: Former Research scientist (life sciences)  . Smokeless tobacco: Never Used  . Tobacco comment: quit in 1953  Vaping Use  . Vaping Use: Never used  Substance and Sexual Activity  . Alcohol use: No  . Drug use: No  .  Sexual activity: Not Currently  Other Topics Concern  . Not on file  Social History Narrative   WWII veteran   Independent on his ADL     Lives w/ wife   Social Determinants of Health   Financial Resource Strain: Not on file  Food Insecurity: Not on file  Transportation Needs: Not on file  Physical Activity: Not on file  Stress: Not on file  Social Connections: Not on file     Family History: The patient's family history includes CAD in an other family member; Kidney Stones in his son; Lung cancer in his father. There is no history of Prostate cancer, Diabetes, or Colon cancer. ROS:   Please see the history of present illness.    All 14 point review of systems negative except as described per history of present illness  EKGs/Labs/Other Studies Reviewed:      Recent Labs: 09/18/2020: TSH 1.535 09/20/2020: Magnesium 2.0 10/22/2020: B Natriuretic Peptide 94.4 11/15/2020: ALT 12; BUN 15; Creatinine, Ser 0.92; Hemoglobin 12.6; Platelets 177.0; Potassium 4.0; Sodium 134  Recent Lipid Panel    Component Value Date/Time   CHOL 109 10/28/2019 1056   TRIG 152.0 (H) 10/28/2019 1056   HDL 38.80 (L) 10/28/2019 1056   CHOLHDL 3 10/28/2019 1056   VLDL 30.4 10/28/2019 1056   LDLCALC 39 10/28/2019 1056   LDLDIRECT 57.3 07/29/2014 0820    Physical  Exam:    VS:  BP (!) 96/50 (BP Location: Right Arm)   Pulse 64   Ht 5\' 8"  (1.727 m)   Wt 135 lb (61.2 kg)   SpO2 95%   BMI 20.53 kg/m     Wt Readings from Last 3 Encounters:  01/27/21 135 lb (61.2 kg)  01/11/21 133 lb (60.3 kg)  12/13/20 136 lb 8 oz (61.9 kg)     GEN:  Well nourished, well developed in no acute distress HEENT: Normal NECK: No JVD; No carotid bruits LYMPHATICS: No lymphadenopathy CARDIAC: RRR, no murmurs, no rubs, no gallops RESPIRATORY:  Clear to auscultation without rales, wheezing or rhonchi  ABDOMEN: Soft, non-tender, non-distended MUSCULOSKELETAL:  No edema; No deformity  SKIN: Warm and dry LOWER EXTREMITIES: no swelling NEUROLOGIC:  Alert and oriented x 3 PSYCHIATRIC:  Normal affect   ASSESSMENT:    1. Dyspnea on exertion   2. Ischemic cardiomyopathy   3. Essential hypertension   4. Chronic systolic CHF (congestive heart failure) ([place])   5. Dyslipidemia    PLAN:    In order of problems listed above:  1. Dyspnea on exertion multifactorial recent pneumonia as well as Covid probably play some significant role here.  He did have echocardiogram which showed ejection fraction 35 to 40%.  On the physical exam I do not see grossly evidence of decompensation of his congestive heart failure but will try to get his proBNP trying to determine if there is a room for improvement today in that area.  The biggest obstacle that we do not have is probably his blood pressure being low as well as kidney dysfunction. 2. Ischemic cardiomyopathy again we will get proBNP try to determine what part of his symptomatology is related to his heart.  If there have been no indications for severe significant congestive heart failure he may benefit from pulmonary appointment.  He did have appointment with them already however he did not follow-up.  He did not feel like he needed that.  He did have chest x-ray done before which diagnosed him with pneumonia I will repeat his  chest x-ray  to see if there is resolution of the problem. 3. Dyslipidemia, he is taking simvastatin 10 mg which I will continue, his LDL is 39, his HDL is 38.  We will continue present management. 4. Overall he is a elderly fragile gentleman.  I anticipate having difficulty putting him on correct medication because of low blood pressure.  We will try to assess his heart to see how much we can improve from that point review.  And then will determine if he needs to see pulmonary.   Medication Adjustments/Labs and Tests Ordered: Current medicines are reviewed at length with the patient today.  Concerns regarding medicines are outlined above.  Orders Placed This Encounter  Procedures  . DG Chest 2 View  . Basic metabolic panel  . Pro b natriuretic peptide (BNP)  . EKG 12-Lead   Medication changes: No orders of the defined types were placed in this encounter.   Signed, Park Liter, MD, [facility] 01/27/2021 11:30 AM    [place]

## 2021-01-28 LAB — BASIC METABOLIC PANEL
BUN/Creatinine Ratio: 12 (ref 10–24)
BUN: 10 mg/dL (ref 10–36)
CO2: 25 mmol/L (ref 20–29)
Calcium: 9 mg/dL (ref 8.6–10.2)
Chloride: 100 mmol/L (ref 96–106)
Creatinine, Ser: 0.86 mg/dL (ref 0.76–1.27)
Glucose: 97 mg/dL (ref 65–99)
Potassium: 4.3 mmol/L (ref 3.5–5.2)
Sodium: 139 mmol/L (ref 134–144)
eGFR: 81 mL/min/{1.73_m2} (ref >59)

## 2021-01-28 LAB — PRO B NATRIURETIC PEPTIDE: NT-Pro BNP: 1077 pg/mL — ABNORMAL HIGH (ref 0–486)

## 2021-01-30 ENCOUNTER — Telehealth: Payer: Self-pay | Admitting: Emergency Medicine

## 2021-01-30 NOTE — Telephone Encounter (Signed)
Called patient with lab results. During call his wife asked if Dr. Agustin Cree has a recommendation on someone who can do laser surgery for the patient's pinched nerve. Will check with him.

## 2021-01-31 NOTE — Telephone Encounter (Signed)
Called patient wife informed her that Dr. Agustin Cree does not know of anyone to do this. Advised her she checked with his pcp. She understood no further questions.

## 2021-01-31 NOTE — Telephone Encounter (Signed)
Sadly, I do not know anybody.  Maybe his primary care physician can recommend somebody

## 2021-02-01 DIAGNOSIS — H401121 Primary open-angle glaucoma, left eye, mild stage: Secondary | ICD-10-CM | POA: Diagnosis not present

## 2021-02-01 DIAGNOSIS — H401113 Primary open-angle glaucoma, right eye, severe stage: Secondary | ICD-10-CM | POA: Diagnosis not present

## 2021-02-03 ENCOUNTER — Telehealth: Payer: Self-pay | Admitting: Emergency Medicine

## 2021-02-03 NOTE — Telephone Encounter (Signed)
-----   Message from Park Liter, MD sent at 02/03/2021 12:12 PM EDT ----- Chest x-ray shows some chronic changes.  There is some suspicion for ileus, please call patient and ask to make sure he does not have any GI symptoms.  If he does we may need to do abdominal x-ray.

## 2021-02-03 NOTE — Telephone Encounter (Signed)
Called patient informed his wife of results per dpr. Asked about any gi symptoms. She reports he does have problems with constipation but that's it. Will inform Dr. Agustin Cree.

## 2021-02-06 ENCOUNTER — Telehealth: Payer: Self-pay | Admitting: Internal Medicine

## 2021-02-06 MED ORDER — PANTOPRAZOLE SODIUM 40 MG PO TBEC: 40.0000 mg | DELAYED_RELEASE_TABLET | Freq: Every day | ORAL | 3 refills | Status: AC

## 2021-02-06 NOTE — Telephone Encounter (Signed)
pantoprazole (PROTONIX) 40 MG tablet  Patient's wife called requesting for a paper Script to be mailed to them so they can have refill at the [facility]. Due to it being cheaper to refill there.

## 2021-02-06 NOTE — Telephone Encounter (Signed)
Rx mailed.

## 2021-02-09 ENCOUNTER — Other Ambulatory Visit: Payer: Self-pay

## 2021-02-09 ENCOUNTER — Encounter (INDEPENDENT_AMBULATORY_CARE_PROVIDER_SITE_OTHER): Payer: Medicare Other | Admitting: Ophthalmology

## 2021-02-09 DIAGNOSIS — H35033 Hypertensive retinopathy, bilateral: Secondary | ICD-10-CM

## 2021-02-09 DIAGNOSIS — I10 Essential (primary) hypertension: Secondary | ICD-10-CM | POA: Diagnosis not present

## 2021-02-09 DIAGNOSIS — H353231 Exudative age-related macular degeneration, bilateral, with active choroidal neovascularization: Secondary | ICD-10-CM

## 2021-02-09 DIAGNOSIS — H43813 Vitreous degeneration, bilateral: Secondary | ICD-10-CM

## 2021-02-09 NOTE — Telephone Encounter (Signed)
This is probably exactly what was seen on a chest x-ray.  No need to intervene at this stage.

## 2021-02-09 NOTE — Telephone Encounter (Signed)
Called patient. Informed him that not further action is needed at this point. He understood no further questions.

## 2021-02-14 ENCOUNTER — Ambulatory Visit (INDEPENDENT_AMBULATORY_CARE_PROVIDER_SITE_OTHER): Payer: Medicare Other | Admitting: Internal Medicine

## 2021-02-14 ENCOUNTER — Encounter: Payer: Self-pay | Admitting: Internal Medicine

## 2021-02-14 ENCOUNTER — Other Ambulatory Visit: Payer: Self-pay

## 2021-02-14 VITALS — BP 146/76 | HR 93 | Temp 97.0°F | Ht 68.0 in | Wt 137.6 lb

## 2021-02-14 DIAGNOSIS — I2510 Atherosclerotic heart disease of native coronary artery without angina pectoris: Secondary | ICD-10-CM | POA: Diagnosis not present

## 2021-02-14 DIAGNOSIS — I10 Essential (primary) hypertension: Secondary | ICD-10-CM

## 2021-02-14 DIAGNOSIS — I5022 Chronic systolic (congestive) heart failure: Secondary | ICD-10-CM | POA: Diagnosis not present

## 2021-02-14 DIAGNOSIS — J9611 Chronic respiratory failure with hypoxia: Secondary | ICD-10-CM | POA: Diagnosis not present

## 2021-02-14 NOTE — Progress Notes (Signed)
Subjective:    Patient ID: Christopher Hogan, male    DOB: Apr 08, 1925, 87 y.o.   MRN: [id number]  DOS:  02/14/2021 Type of visit - description: F/U, here with his wife  Since the last office visit saw a doctor at the [facility] and cardiology. Labs and notes reviewed.  Still using oxygen at night, would like to stop it if possible. Still having pain at the back and takes occasional hydrocodone  He weights daily, has gained 3 pounds on his own scales but they have been over a period of months   BP Readings from Last 3 Encounters:  02/14/21 (!) 146/76  01/27/21 (!) 96/50  01/11/21 (!) 144/66   Wt Readings from Last 3 Encounters:  02/14/21 137 lb 9.6 oz (62.4 kg)  01/27/21 135 lb (61.2 kg)  01/11/21 133 lb (60.3 kg)       Review of Systems Denies edema. Minimal cough, no wheezing, DOE still there but improving.   Past Medical History:  Diagnosis Date  . AAA (abdominal aortic aneurysm) ([place])    repaired 02/2002, renal ultrasound, Sept 2010: normal abdominal aorta, normal renal arteries   . Age-related macular degeneration, wet, left eye ([place])   . Allergic rhinitis   . Arthritis   . At risk for pulmonary fibrosis 06/25/2018  . CAD (coronary artery disease)    stable stress test 7/11  . CHF (congestive heart failure) ([place])   . Chronic systolic CHF (congestive heart failure) ([place]) 12/17/2013  . Coronary atherosclerosis 06/09/2007   H/O inferior wall MI- PTCA- 1989 Last stress test 2/17- inferior scar, no ischemia    . DEGENERATIVE JOINT DISEASE 03/17/2010   Qualifier: Diagnosis of  By: Dawson Bills    . Dyslipidemia 05/06/2007   LDL 59 Jan 2019     . Eczema 07/09/2013  . ED (erectile dysfunction)   . Epidermoid cyst of skin of chest 09/29/2018  . ERECTILE DYSFUNCTION 12/09/2007   Qualifier: Diagnosis of  By: Larose Kells MD, [place] hypertension 04/30/2007   Qualifier: Diagnosis of  By: Larose Kells MD, [place] GERD 07/21/2008   Qualifier: Diagnosis of  By: Larose Kells MD, [place]  GERD (gastroesophageal reflux disease)   . Glaucoma and macular degeneration 12/31/2011  . Hyperlipidemia   . Hypertension   . Inguinal hernia 12/31/2011  . Insomnia 02/22/2018  . Ischemic cardiomyopathy 06/23/2018   Last EF 30-44% by Myoview Feb 2017  . Left inguinal hernia 06/30/2018  . Myocardial infarction ([place]) 1989   interior wall infarction  . PCP notes >>>>>>>>>>>>>>>>>>>> 07/17/2016  . Pneumonia 2019  . Pre-operative cardiovascular examination 12/31/2011  . Rhinitis 04/20/2008   Qualifier: Diagnosis of  By: Larose Kells MD, [place] Vertigo 06/20/2015    Past Surgical History:  Procedure Laterality Date  . AAA repair  2003  . CARDIAC CATHETERIZATION    . CHOLECYSTECTOMY    . INGUINAL HERNIA REPAIR Right 1985  . INGUINAL HERNIA REPAIR Left 06/30/2018   open; w/mesh  . INGUINAL HERNIA REPAIR Left 06/30/2018   Procedure: OPEN LEFT INGUINAL HERNIA REPAIR;  Surgeon: Fanny Skates, MD;  Location: [facility];  Service: General;  Laterality: Left;  . INSERTION OF MESH Left 06/30/2018   Procedure: INSERTION OF MESH;  Surgeon: Fanny Skates, MD;  Location: [facility];  Service: General;  Laterality: Left;  Marland Kitchen MASS EXCISION N/A 09/29/2018   Procedure: EXCISION OF 3 CM MASS ANTERIOR CHEST WALL;  Surgeon: Dalbert Batman,  Renelda Loma, MD;  Location: [facility];  Service: General;  Laterality: N/A;  . [place]    Allergies as of 02/14/2021      Reactions   Ace Inhibitors Cough   Aspirin Nausea Only, Other (See Comments)   Full strength only   Spironolactone Rash      Medication List       Accurate as of February 14, 2021 11:59 PM. If you have any questions, ask your nurse or doctor.        STOP taking these medications   furosemide 20 MG tablet Commonly known as: LASIX Stopped by: [name], MD     TAKE these medications   acetaminophen 500 MG tablet Commonly known as: TYLENOL Take 1,000 mg by mouth every 6 (six) hours as needed for mild pain.   albuterol 108 (90 Base) MCG/ACT  inhaler Commonly known as: VENTOLIN HFA Inhale 2 puffs into the lungs every 6 (six) hours as needed for wheezing or shortness of breath.   azelastine 0.1 % nasal spray Commonly known as: ASTELIN Place 1 spray into both nostrils 2 (two) times daily as needed for rhinitis. Use in each nostril as directed   dorzolamidel-timolol 22.3-6.8 MG/ML Soln ophthalmic solution Commonly known as: COSOPT Place 1 drop into both eyes 2 (two) times daily.   fenofibrate 48 MG tablet Commonly known as: TRICOR Take 48 mg by mouth daily.   fluticasone 50 MCG/ACT nasal spray Commonly known as: FLONASE Place 1 spray into both nostrils daily. What changed: when to take this   HYDROcodone-acetaminophen 5-325 MG tablet Commonly known as: Norco 0.5-1 tablet po bid prn pain   ipratropium 0.03 % nasal spray Commonly known as: ATROVENT Place 2 sprays into both nostrils every 12 (twelve) hours.   isosorbide mononitrate 30 MG 24 hr tablet Commonly known as: IMDUR Take 0.5 tablets (15 mg total) by mouth daily.   latanoprost 0.005 % ophthalmic solution Commonly known as: XALATAN Place 1 drop into the right eye at bedtime.   loratadine 10 MG tablet Commonly known as: CLARITIN Take 1 tablet (10 mg total) by mouth daily. What changed:   when to take this  reasons to take this   melatonin 3 MG Tabs tablet Take 3 mg by mouth at bedtime as needed (sleep).   multivitamin with minerals Tabs tablet Take 1 tablet by mouth daily.   nitroGLYCERIN 0.4 MG SL tablet Commonly known as: NITROSTAT Place 1 tablet (0.4 mg total) under the tongue every 5 (five) minutes as needed for chest pain.   pantoprazole 40 MG tablet Commonly known as: PROTONIX Take 1 tablet (40 mg total) by mouth daily.   PreserVision AREDS 2 Caps Take 1 capsule by mouth 2 (two) times daily.   senna-docusate 8.6-50 MG tablet Commonly known as: Senokot-S Take 1 tablet by mouth 2 (two) times daily as needed for moderate constipation.    simvastatin 10 MG tablet Commonly known as: ZOCOR Take 1 tablet (10 mg total) by mouth at bedtime.   tamsulosin 0.4 MG Caps capsule Commonly known as: FLOMAX Take 1 capsule (0.4 mg total) by mouth daily after supper.          Objective:   Physical Exam BP (!) 146/76 (BP Location: Right Arm, Patient Position: Sitting, Cuff Size: Large)   Pulse 93   Temp (!) 97 F (36.1 C) (Temporal)   Ht 5\' 8"  (1.727 m)   Wt 137 lb 9.6 oz (62.4 kg)   SpO2 96%  BMI 20.92 kg/m   General:   Well developed, NAD, BMI noted. HEENT:  Normocephalic . Face symmetric, atraumatic Lungs:  Decreased breath sounds but otherwise clear Normal respiratory effort, no intercostal retractions, no accessory muscle use. Heart: RRR,  no murmur.  Lower extremities: no pretibial edema bilaterally  Skin: Not pale. Not jaundice Neurologic:  alert & oriented X3.  Speech normal, gait at baseline Psych--  Cognition and judgment appear intact.  Cooperative with normal attention span and concentration.  Behavior appropriate. No anxious or depressed appearing.      Assessment      Assessment HTN Hyperlipidemia GERD Insomnia-- used to use elavil, re start  prn All rhinitis CV: --CAD, MI 1989, stress test (-) 2011 ,lexiscan no ischemia  08-2016  --CHF --AAA, s/p repair 2003, [place] 2010 ok Opht: glaucoma, macular degeneration  [facility]  Question of Pulmonary fibrosis (on clinical grounds, see note from 10/2019) Barium swallow on 05/13/2020: Rx chin tuck, small sips Back pain  admited 08-2020, resp Failure, d/c on home O2   PLAN: Respiratory failure with hypoxia due to Covid: Overall I think he is improving, still using oxygen at night only, would like to stop it. At home, O2 sat is approximately 97%. Upon arrival, O2 sat was 76%, I rechecked it at rest: 96%. Plan: Overnight oximetry on room air, consider stop nocturnal oxygen. Okay to change albuterol from TID to TID prn. HTN: BPs have  been low before, today 146/76.  Currently on Imdur.  Rec:  continue monitoring. CAD, CHF: Saw cardiology 01/27/2021, EF noted to be 35%, DOE felt to be multifactorial including recent Covid infection.   Was recommended to take Lasix for few days, they are considering a pulmonary referral. At this point, he does not seem to be volume overloaded, recommend to continue checking his weight.  Follow-up with cardiology as recommended. Left back pain: Mostly on Tylenol, rarely needs hydrocodone. RTC 3 to 4 months  Time spent with the patient: 31 minutes, we had a long conversation about stopping versus staying on oxygen, he really likes to stop.  I also reviewed the chart.  This visit occurred during the SARS-CoV-2 public health emergency.  Safety protocols were in place, including screening questions prior to the visit, additional usage of staff PPE, and extensive cleaning of exam room while observing appropriate contact time as indicated for disinfecting solutions.

## 2021-02-14 NOTE — Patient Instructions (Addendum)
Check the  blood pressure every other day BP GOAL is between 110/65 and  140/85. If it is consistently higher or lower, let me know  Continue checking your weight.  Continue using oxygen at night, will send a company to check your nocturnal oxygen   GO TO THE FRONT DESK, PLEASE SCHEDULE YOUR APPOINTMENTS Come back for a checkup in 4 months

## 2021-02-15 NOTE — Assessment & Plan Note (Signed)
Respiratory failure with hypoxia due to Covid: Overall I think he is improving, still using oxygen at night only, would like to stop it. At home, O2 sat is approximately 97%. Upon arrival, O2 sat was 76%, I rechecked it at rest: 96%. Plan: Overnight oximetry on room air, consider stop nocturnal oxygen. Okay to change albuterol from TID to TID prn. HTN: BPs have been low before, today 146/76.  Currently on Imdur.  Rec:  continue monitoring. CAD, CHF: Saw cardiology 01/27/2021, EF noted to be 35%, DOE felt to be multifactorial including recent Covid infection.   Was recommended to take Lasix for few days, they are considering a pulmonary referral. At this point, he does not seem to be volume overloaded, recommend to continue checking his weight.  Follow-up with cardiology as recommended. Left back pain: Mostly on Tylenol, rarely needs hydrocodone. RTC 3 to 4 months

## 2021-02-16 ENCOUNTER — Telehealth: Payer: Self-pay | Admitting: Physical Medicine and Rehabilitation

## 2021-02-16 NOTE — Telephone Encounter (Signed)
I am not a surgeon so I do not do surgery.  2.  I am not sure of anyone doing specific laser surgery at this point as the [facility] is now out of business.  3.  Can always have a consultation with a neurosurgeon to see what the options are.  Specifically minimally invasive if he is a candidate.

## 2021-02-16 NOTE — Telephone Encounter (Signed)
Pt wife called and states pt has pinched nerve and wants to know if the pt could get approved for laser surgery and know if anyone if even Dr.Newton could do it. Please give her a call back [phone]

## 2021-02-16 NOTE — Telephone Encounter (Signed)
Called patient's wife to advise. They will call us back if he wants a referral to a neurosurgeon.

## 2021-02-16 NOTE — Telephone Encounter (Signed)
Please advise 

## 2021-02-20 ENCOUNTER — Encounter: Payer: Self-pay | Admitting: Internal Medicine

## 2021-02-20 DIAGNOSIS — J9691 Respiratory failure, unspecified with hypoxia: Secondary | ICD-10-CM | POA: Diagnosis not present

## 2021-02-20 DIAGNOSIS — G4730 Sleep apnea, unspecified: Secondary | ICD-10-CM | POA: Diagnosis not present

## 2021-02-20 DIAGNOSIS — R0683 Snoring: Secondary | ICD-10-CM | POA: Diagnosis not present

## 2021-02-20 DIAGNOSIS — J8410 Pulmonary fibrosis, unspecified: Secondary | ICD-10-CM | POA: Diagnosis not present

## 2021-02-20 DIAGNOSIS — I5022 Chronic systolic (congestive) heart failure: Secondary | ICD-10-CM | POA: Diagnosis not present

## 2021-02-23 ENCOUNTER — Telehealth: Payer: Self-pay

## 2021-02-23 NOTE — Telephone Encounter (Signed)
Overnight O2 results received from [facility]. Placed in PCP red folder.

## 2021-02-24 NOTE — Telephone Encounter (Signed)
LMOM informing of results and to continue oxygen overnight/while sleeping. Informed to call if questions/concerns. Overnight oximetry sent for scanning.

## 2021-02-24 NOTE — Telephone Encounter (Signed)
Results reviewed, still has episode where oxygen at night is less than 88%, on no oxygen supplements.  Recommend to continue oxygen at night

## 2021-02-28 NOTE — Telephone Encounter (Signed)
Spoke w/ [name]- informed of results- she verbalized understanding.

## 2021-03-07 ENCOUNTER — Telehealth: Payer: Self-pay | Admitting: Cardiology

## 2021-03-07 MED ORDER — ISOSORBIDE MONONITRATE ER 30 MG PO TB24: 15.0000 mg | ORAL_TABLET | Freq: Every day | ORAL | 3 refills | Status: AC

## 2021-03-07 NOTE — Telephone Encounter (Signed)
*  STAT* If patient is at the pharmacy, call can be transferred to refill team.   1. Which medications need to be refilled? (please list name of each medication and dose if known)  isosorbide mononitrate (IMDUR) 30 MG 24 hr tablet  2. Which pharmacy/location (including street and city if local pharmacy) is medication to be sent to?  [id number] - [place], [place] - [address], [address] AT [facility] [address]  3. Do they need a 30 day or 90 day supply? 30 day supply

## 2021-03-07 NOTE — Telephone Encounter (Signed)
Refill sent in per request.  

## 2021-03-13 DIAGNOSIS — I714 Abdominal aortic aneurysm, without rupture, unspecified: Secondary | ICD-10-CM | POA: Insufficient documentation

## 2021-03-15 ENCOUNTER — Other Ambulatory Visit: Payer: Self-pay

## 2021-03-15 DIAGNOSIS — I10 Essential (primary) hypertension: Secondary | ICD-10-CM | POA: Insufficient documentation

## 2021-03-16 ENCOUNTER — Encounter (INDEPENDENT_AMBULATORY_CARE_PROVIDER_SITE_OTHER): Payer: Medicare Other | Admitting: Ophthalmology

## 2021-03-16 ENCOUNTER — Other Ambulatory Visit: Payer: Self-pay

## 2021-03-16 ENCOUNTER — Ambulatory Visit: Payer: Medicare Other | Admitting: Cardiology

## 2021-03-16 DIAGNOSIS — H35033 Hypertensive retinopathy, bilateral: Secondary | ICD-10-CM | POA: Diagnosis not present

## 2021-03-16 DIAGNOSIS — I10 Essential (primary) hypertension: Secondary | ICD-10-CM | POA: Diagnosis not present

## 2021-03-16 DIAGNOSIS — H43813 Vitreous degeneration, bilateral: Secondary | ICD-10-CM

## 2021-03-16 DIAGNOSIS — H353231 Exudative age-related macular degeneration, bilateral, with active choroidal neovascularization: Secondary | ICD-10-CM

## 2021-03-17 ENCOUNTER — Encounter: Payer: Self-pay | Admitting: Cardiology

## 2021-03-17 ENCOUNTER — Ambulatory Visit: Payer: Medicare Other | Admitting: Cardiology

## 2021-03-17 VITALS — BP 138/70 | HR 75 | Ht 67.0 in | Wt 135.0 lb

## 2021-03-17 DIAGNOSIS — I5022 Chronic systolic (congestive) heart failure: Secondary | ICD-10-CM

## 2021-03-17 DIAGNOSIS — I714 Abdominal aortic aneurysm, without rupture, unspecified: Secondary | ICD-10-CM

## 2021-03-17 DIAGNOSIS — I255 Ischemic cardiomyopathy: Secondary | ICD-10-CM | POA: Diagnosis not present

## 2021-03-17 DIAGNOSIS — I10 Essential (primary) hypertension: Secondary | ICD-10-CM | POA: Diagnosis not present

## 2021-03-17 MED ORDER — LOSARTAN POTASSIUM 25 MG PO TABS: 25.0000 mg | ORAL_TABLET | Freq: Every day | ORAL | 1 refills | Status: AC

## 2021-03-17 NOTE — Patient Instructions (Signed)
Medication Instructions:  Your physician has recommended you make the following change in your medication:  START: Losartan 25 mg daily   *If you need a refill on your cardiac medications before your next appointment, please call your pharmacy*   Lab Work: Your physician recommends that you return for lab work 1 week: BMP   If you have labs (blood work) drawn today and your tests are completely normal, you will receive your results only by: Marland Kitchen MyChart Message (if you have MyChart) OR . A paper copy in the mail If you have any lab test that is abnormal or we need to change your treatment, we will call you to review the results.   Testing/Procedures: None   Follow-Up: At [facility], you and your health needs are our priority.  As part of our continuing mission to provide you with exceptional heart care, we have created designated Provider Care Teams.  These Care Teams include your primary Cardiologist (physician) and Advanced Practice Providers (APPs -  Physician Assistants and Nurse Practitioners) who all work together to provide you with the care you need, when you need it.  We recommend signing up for the patient portal called "MyChart".  Sign up information is provided on this After Visit Summary.  MyChart is used to connect with patients for Virtual Visits (Telemedicine).  Patients are able to view lab/test results, encounter notes, upcoming appointments, etc.  Non-urgent messages can be sent to your provider as well.   To learn more about what you can do with MyChart, go to [email or web address].    Your next appointment:   3 month(s)  The format for your next appointment:   In Person  Provider:   Jenne Campus, MD   Other Instructions  Losartan Tablets What is this medicine? LOSARTAN (loe SAR tan) is an angiotensin II receptor blocker, also known as an ARB. It treats high blood pressure. It can slow kidney damage in some patients. It may also be used to lower the  risk of stroke. This medicine may be used for other purposes; ask your health care provider or pharmacist if you have questions. COMMON BRAND NAME(S): Cozaar What should I tell my health care provider before I take this medicine? They need to know if you have any of these conditions:  heart failure  kidney disease  liver disease  an unusual or allergic reaction to losartan, other medicines, foods, dyes, or preservatives  pregnant or trying to get pregnant  breast-feeding How should I use this medicine? Take this medicine by mouth. Take it as directed on the prescription label at the same time every day. You can take it with or without food. If it upsets your stomach, take it with food. Keep taking it unless your health care provider tells you to stop. Talk to your health care provider about the use of this medicine in children. While it may be prescribed for children as young as 6 for selected conditions, precautions do apply. Overdosage: If you think you have taken too much of this medicine contact a poison control center or emergency room at once. NOTE: This medicine is only for you. Do not share this medicine with others. What if I miss a dose? If you miss a dose, take it as soon as you can. If it is almost time for your next dose, take only that dose. Do not take double or extra doses. What may interact with this medicine?  aliskiren  ACE inhibitors, like enalapril or lisinopril  diuretics, especially amiloride, eplerenone, spironolactone, or triamterene  lithium  NSAIDs, medicines for pain and inflammation, like ibuprofen or naproxen  potassium salts or potassium supplements This list may not describe all possible interactions. Give your health care provider a list of all the medicines, herbs, non-prescription drugs, or dietary supplements you use. Also tell them if you smoke, drink alcohol, or use illegal drugs. Some items may interact with your medicine. What should I  watch for while using this medicine? Visit your health care provider for regular check ups. Check your blood pressure as directed. Ask your health care provider what your blood pressure should be. Also, find out when you should contact him or her. Do not treat yourself for coughs, colds, or pain while you are using this medicine without asking your health care provider for advice. Some medicines may increase your blood pressure. Women should inform their health care provider if they wish to become pregnant or think they might be pregnant. There is a potential for serious side effects to an unborn child. Talk to your health care provider for more information. You may get drowsy or dizzy. Do not drive, use machinery, or do anything that needs mental alertness until you know how this medicine affects you. Do not stand or sit up quickly, especially if you are an older patient. This reduces the risk of dizzy or fainting spells. Alcohol can make you more drowsy and dizzy. Avoid alcoholic drinks. Avoid salt substitutes unless you are told otherwise by your health care provider. What side effects may I notice from receiving this medicine? Side effects that you should report to your doctor or health care professional as soon as possible:  allergic reactions (skin rash, itching or hives, swelling of the hands, feet, face, lips, throat, or tongue)  breathing problems  high potassium levels (chest pain; or fast, irregular heartbeat; muscle weakness)  kidney injury (trouble passing urine or change in the amount of urine)  low blood pressure (dizziness; feeling faint or lightheaded, falls; unusually weak or tired) Side effects that usually do not require medical attention (report to your doctor or health care professional if they continue or are bothersome):  cough  headache  nasal congestion or stuffiness  nausea or stomach pain This list may not describe all possible side effects. Call your doctor for  medical advice about side effects. You may report side effects to FDA at 1-800-FDA-1088. Where should I keep my medicine? Keep out of the reach of children and pets. Store at room temperature between 20 and 25 degrees C (68 and 77 degrees F). Protect from light. Keep the container tightly closed. Get rid of any unused medicine after the expiration date. To get rid of medicines that are no longer needed or have expired:  Take the medicine to a medicine take-back program. Check with your pharmacy or law enforcement to find a location.  If you cannot return the medicine, check the label or package insert to see if the medicine should be thrown out in the garbage or flushed down the toilet. If you are not sure, ask your health care provider. If it is safe to put in the trash, empty the medicine out of the container. Mix the medicine with cat litter, dirt, coffee grounds, or other unwanted substance. Seal the mixture in a bag or container. Put it in the trash. NOTE: This sheet is a summary. It may not cover all possible information. If you have questions about this medicine, talk to your doctor,  pharmacist, or health care provider.  2021 Elsevier/Gold Standard (2020-01-14 16:16:09)

## 2021-03-17 NOTE — Progress Notes (Signed)
Cardiology Office Note:    Date:  03/17/2021   ID:  RAGAN REALE, DOB 1925-09-30, MRN [id number]  PCP:  Colon Branch, MD  Cardiologist:  Jenne Campus, MD    Referring MD: Colon Branch, MD   Chief Complaint  Patient presents with  . Follow-up  Doing better  History of Present Illness:    Christopher Hogan is a 87 y.o. male with past medical history significant for coronary artery disease.  He 90s he did suffer from inferior wall myocardial infarction.  He is baseline left ventricle ejection fraction is -35 to 40%.  Also couple months ago he suffered from COVID-19 infection required multiple antibiotic when I did see him last time he was still in the process of recovering from COVID-19 infection.  At that time he was complaining of having shortness of breath and fatigue.  He gradually improving but very mildly.  He does not use oxygen anymore except for during the night.  He try to move and walk a little bit around, have some difficulty doing this getting tired quite easily.  There is no proximal nocturnal dyspnea, there is no swelling of lower extremities no chest pain tightness squeezing pressure burning chest except 1 episode that he had few days ago.  Past Medical History:  Diagnosis Date  . AAA (abdominal aortic aneurysm) ([place])    repaired 02/2002, renal ultrasound, Sept 2010: normal abdominal aorta, normal renal arteries   . Acute respiratory failure due to COVID-19 ([place]) 10/22/2020  . Age-related macular degeneration, wet, left eye ([place])   . Allergic rhinitis   . Annual physical exam 12/31/2011  . Arthritis   . At risk for pulmonary fibrosis 06/25/2018  . CAD (coronary artery disease)    stable stress test 7/11  . CHF (congestive heart failure) ([place])   . Chronic bilateral low back pain with sciatica 09/03/2020  . Chronic systolic CHF (congestive heart failure) ([place]) 12/17/2013  . Coronary atherosclerosis 06/09/2007   H/O inferior wall MI- PTCA- 1989 Last stress test 2/17-  inferior scar, no ischemia    . DEGENERATIVE JOINT DISEASE 03/17/2010   Qualifier: Diagnosis of  By: Dawson Bills    . Dyslipidemia 05/06/2007   LDL 59 Jan 2019     . Eczema 07/09/2013  . ED (erectile dysfunction)   . Epidermoid cyst of skin of chest 09/29/2018  . ERECTILE DYSFUNCTION 12/09/2007   Qualifier: Diagnosis of  By: Larose Kells MD, [place] hypertension 04/30/2007   Qualifier: Diagnosis of  By: Larose Kells MD, [place] GERD 07/21/2008   Qualifier: Diagnosis of  By: Larose Kells MD, [place] GERD (gastroesophageal reflux disease)   . Glaucoma and macular degeneration 12/31/2011  . Hearing loss 01/23/2021  . History of myocardial infarction 01/23/2021   Dec 21, 2016 Entered By: Windy Fast T Comment: 1989  . Hyperlipidemia   . Hypertension   . Inguinal hernia 12/31/2011  . Insomnia 02/22/2018  . Ischemic cardiomyopathy 06/23/2018   Last EF 30-44% by Myoview Feb 2017  . Left inguinal hernia 06/30/2018  . Myocardial infarction ([place]) 1989   interior wall infarction  . PCP notes >>>>>>>>>>>>>>>>>>>> 07/17/2016  . Pneumonia 2019  . Pre-operative cardiovascular examination 12/31/2011  . Primary open angle glaucoma 01/23/2021  . Pulmonary fibrosis ([place]) 10/29/2019  . Respiratory failure with hypoxia ([place]) 09/16/2020  . Rhinitis 04/20/2008   Qualifier: Diagnosis of  By: Larose Kells MD, [place] Vertigo 06/20/2015  Past Surgical History:  Procedure Laterality Date  . AAA repair  2003  . CARDIAC CATHETERIZATION    . CHOLECYSTECTOMY    . INGUINAL HERNIA REPAIR Right 1985  . INGUINAL HERNIA REPAIR Left 06/30/2018   open; w/mesh  . INGUINAL HERNIA REPAIR Left 06/30/2018   Procedure: OPEN LEFT INGUINAL HERNIA REPAIR;  Surgeon: Fanny Skates, MD;  Location: [facility];  Service: General;  Laterality: Left;  . INSERTION OF MESH Left 06/30/2018   Procedure: INSERTION OF MESH;  Surgeon: Fanny Skates, MD;  Location: [facility];  Service: General;  Laterality: Left;  Marland Kitchen MASS EXCISION N/A 09/29/2018   Procedure:  EXCISION OF 3 CM MASS ANTERIOR CHEST WALL;  Surgeon: Fanny Skates, MD;  Location: [facility];  Service: General;  Laterality: N/A;  . PTCA  1989, 1992    Current Medications: Current Meds  Medication Sig  . acetaminophen (TYLENOL) 500 MG tablet Take 1,000 mg by mouth every 6 (six) hours as needed for mild pain.  Marland Kitchen albuterol (VENTOLIN HFA) 108 (90 Base) MCG/ACT inhaler Inhale 2 puffs into the lungs every 6 (six) hours as needed for wheezing or shortness of breath.  Marland Kitchen azelastine (ASTELIN) 0.1 % nasal spray Place 1 spray into both nostrils 2 (two) times daily as needed for rhinitis. Use in each nostril as directed  . Dorzolamide HCl-Timolol Mal PF 22.3-6.8 MG/ML SOLN Place 1 drop into both eyes 2 (two) times daily.   . fenofibrate (TRICOR) 48 MG tablet Take 48 mg by mouth daily.  Marland Kitchen HYDROcodone-acetaminophen (NORCO) 5-325 MG tablet 0.5-1 tablet po bid prn pain (Patient taking differently: Take 1 tablet by mouth 2 (two) times daily as needed for moderate pain or severe pain. 0.5-1 tablet po bid prn pain)  . isosorbide mononitrate (IMDUR) 30 MG 24 hr tablet Take 0.5 tablets (15 mg total) by mouth daily.  Marland Kitchen latanoprost (XALATAN) 0.005 % ophthalmic solution Place 1 drop into the right eye at bedtime.  Marland Kitchen loratadine (CLARITIN) 10 MG tablet Take 1 tablet (10 mg total) by mouth daily. (Patient taking differently: Take 10 mg by mouth daily as needed for allergies.)  . losartan (COZAAR) 25 MG tablet Take 1 tablet (25 mg total) by mouth daily.  . melatonin 3 MG TABS tablet Take 3 mg by mouth at bedtime as needed (sleep).  . Multiple Vitamins-Minerals (PRESERVISION AREDS 2) CAPS Take 1 capsule by mouth 2 (two) times daily.  . nitroGLYCERIN (NITROSTAT) 0.4 MG SL tablet Place 1 tablet (0.4 mg total) under the tongue every 5 (five) minutes as needed for chest pain.  . pantoprazole (PROTONIX) 40 MG tablet Take 1 tablet (40 mg total) by mouth daily.  Marland Kitchen senna-docusate (SENOKOT-S) 8.6-50 MG tablet  Take 1 tablet by mouth 2 (two) times daily as needed for moderate constipation.  . simvastatin (ZOCOR) 10 MG tablet Take 1 tablet (10 mg total) by mouth at bedtime.  . tamsulosin (FLOMAX) 0.4 MG CAPS capsule Take 1 capsule (0.4 mg total) by mouth daily after supper.  . [DISCONTINUED] amLODipine (NORVASC) 2.5 MG tablet Take 2.5 mg by mouth daily.  . [DISCONTINUED] fluticasone (FLONASE) 50 MCG/ACT nasal spray PLACE 1 SPRAY INTO BOTH NOSTRILS DAILY. (Patient taking differently: Place 1 spray into both nostrils 2 (two) times daily.)     Allergies:   Ace inhibitors, Aspirin, and Spironolactone   Social History   Socioeconomic History  . Marital status: Married    Spouse name: Not on file  . Number of children: 3  . Years  of education: Not on file  . Highest education level: Not on file  Occupational History  . Occupation: retired     Fish farm manager: RETIRED  Tobacco Use  . Smoking status: Former Research scientist (life sciences)  . Smokeless tobacco: Never Used  . Tobacco comment: quit in 1953  Vaping Use  . Vaping Use: Never used  Substance and Sexual Activity  . Alcohol use: No  . Drug use: No  . Sexual activity: Not Currently  Other Topics Concern  . Not on file  Social History Narrative   WWII veteran   Independent on his ADL     Lives w/ wife   Social Determinants of Health   Financial Resource Strain: Not on file  Food Insecurity: Not on file  Transportation Needs: Not on file  Physical Activity: Not on file  Stress: Not on file  Social Connections: Not on file     Family History: The patient's family history includes CAD in an other family member; Kidney Stones in his son; Lung cancer in his father. There is no history of Prostate cancer, Diabetes, or Colon cancer. ROS:   Please see the history of present illness.    All 14 point review of systems negative except as described per history of present illness  EKGs/Labs/Other Studies Reviewed:      Recent Labs: 09/18/2020: TSH  1.535 09/20/2020: Magnesium 2.0 10/22/2020: B Natriuretic Peptide 94.4 11/15/2020: ALT 12; Hemoglobin 12.6; Platelets 177.0 01/27/2021: BUN 10; Creatinine, Ser 0.86; NT-Pro BNP 1,077; Potassium 4.3; Sodium 139  Recent Lipid Panel    Component Value Date/Time   CHOL 109 10/28/2019 1056   TRIG 152.0 (H) 10/28/2019 1056   HDL 38.80 (L) 10/28/2019 1056   CHOLHDL 3 10/28/2019 1056   VLDL 30.4 10/28/2019 1056   LDLCALC 39 10/28/2019 1056   LDLDIRECT 57.3 07/29/2014 0820    Physical Exam:    VS:  BP 138/70 (BP Location: Right Arm, Patient Position: Sitting)   Pulse 75   Ht 5\' 7"  (1.702 m)   Wt 135 lb (61.2 kg)   SpO2 95%   BMI 21.14 kg/m     Wt Readings from Last 3 Encounters:  03/17/21 135 lb (61.2 kg)  02/14/21 137 lb 9.6 oz (62.4 kg)  01/27/21 135 lb (61.2 kg)     GEN:  Well nourished, well developed in no acute distress HEENT: Normal NECK: No JVD; No carotid bruits LYMPHATICS: No lymphadenopathy CARDIAC: RRR, no murmurs, no rubs, no gallops RESPIRATORY:  Clear to auscultation without rales, wheezing or rhonchi  ABDOMEN: Soft, non-tender, non-distended MUSCULOSKELETAL:  No edema; No deformity  SKIN: Warm and dry LOWER EXTREMITIES: no swelling NEUROLOGIC:  Alert and oriented x 3 PSYCHIATRIC:  Normal affect   ASSESSMENT:    1. Ischemic cardiomyopathy   2. Essential hypertension   3. Chronic systolic CHF (congestive heart failure) (HCC)   4. Abdominal aortic aneurysm (AAA) without rupture (HCC)    PLAN:    In order of problems listed above:  1. Ischemic cardiomyopathy.  I will try to reintroduce ARB.  His blood pressure seems to be better controlled.  I will initiate losartan 25 mg daily, Chem-7 need to be checked next week.  We will continue with beta-blockade. 2. Essential hypertension blood pressure seems to be reasonably controlled right now.  Previously we had a problem hypertension now his blood pressure is improving.  There is an opportunity to start appropriate  therapy for his cardiomyopathy again losartan will be started. 3. Chronic systolic congestive heart  failure ejection fraction 35 to 40%.  Hemodynamically he is compensated.  Plan as outlined above. 4. Abdominal aortic aneurysm.  Noted. 5. Overall he is a fragile gentleman.  We will try to put him back on ARB however I warned him if he develop fatigue tiredness he did to discontinue that medication.   Medication Adjustments/Labs and Tests Ordered: Current medicines are reviewed at length with the patient today.  Concerns regarding medicines are outlined above.  Orders Placed This Encounter  Procedures  . Basic metabolic panel   Medication changes:  Meds ordered this encounter  Medications  . losartan (COZAAR) 25 MG tablet    Sig: Take 1 tablet (25 mg total) by mouth daily.    Dispense:  90 tablet    Refill:  1    Signed, Park Liter, MD, [facility] 03/17/2021 10:19 AM    [place]

## 2021-03-21 DIAGNOSIS — M7062 Trochanteric bursitis, left hip: Secondary | ICD-10-CM | POA: Diagnosis not present

## 2021-03-21 DIAGNOSIS — M5416 Radiculopathy, lumbar region: Secondary | ICD-10-CM | POA: Diagnosis not present

## 2021-03-24 DIAGNOSIS — I714 Abdominal aortic aneurysm, without rupture: Secondary | ICD-10-CM | POA: Diagnosis not present

## 2021-03-24 DIAGNOSIS — I10 Essential (primary) hypertension: Secondary | ICD-10-CM | POA: Diagnosis not present

## 2021-03-24 DIAGNOSIS — I5022 Chronic systolic (congestive) heart failure: Secondary | ICD-10-CM | POA: Diagnosis not present

## 2021-03-24 DIAGNOSIS — I255 Ischemic cardiomyopathy: Secondary | ICD-10-CM | POA: Diagnosis not present

## 2021-03-25 LAB — BASIC METABOLIC PANEL
BUN/Creatinine Ratio: 13 (ref 10–24)
BUN: 10 mg/dL (ref 10–36)
CO2: 24 mmol/L (ref 20–29)
Calcium: 8.8 mg/dL (ref 8.6–10.2)
Chloride: 99 mmol/L (ref 96–106)
Creatinine, Ser: 0.79 mg/dL (ref 0.76–1.27)
Glucose: 85 mg/dL (ref 65–99)
Potassium: 3.9 mmol/L (ref 3.5–5.2)
Sodium: 139 mmol/L (ref 134–144)
eGFR: 83 mL/min/{1.73_m2} (ref >59)

## 2021-03-29 DIAGNOSIS — R0902 Hypoxemia: Secondary | ICD-10-CM | POA: Diagnosis not present

## 2021-03-29 DIAGNOSIS — M79604 Pain in right leg: Secondary | ICD-10-CM | POA: Diagnosis not present

## 2021-03-29 DIAGNOSIS — R0602 Shortness of breath: Secondary | ICD-10-CM | POA: Diagnosis not present

## 2021-03-29 DIAGNOSIS — I517 Cardiomegaly: Secondary | ICD-10-CM | POA: Diagnosis not present

## 2021-03-29 DIAGNOSIS — M25551 Pain in right hip: Secondary | ICD-10-CM | POA: Diagnosis not present

## 2021-03-29 DIAGNOSIS — J189 Pneumonia, unspecified organism: Secondary | ICD-10-CM | POA: Diagnosis not present

## 2021-03-30 DIAGNOSIS — I517 Cardiomegaly: Secondary | ICD-10-CM | POA: Diagnosis not present

## 2021-03-30 DIAGNOSIS — I252 Old myocardial infarction: Secondary | ICD-10-CM | POA: Diagnosis not present

## 2021-03-30 DIAGNOSIS — I491 Atrial premature depolarization: Secondary | ICD-10-CM | POA: Diagnosis not present

## 2021-03-31 DIAGNOSIS — R21 Rash and other nonspecific skin eruption: Secondary | ICD-10-CM | POA: Diagnosis not present

## 2021-03-31 DIAGNOSIS — I255 Ischemic cardiomyopathy: Secondary | ICD-10-CM | POA: Diagnosis not present

## 2021-03-31 DIAGNOSIS — R0902 Hypoxemia: Secondary | ICD-10-CM | POA: Diagnosis not present

## 2021-03-31 DIAGNOSIS — R0602 Shortness of breath: Secondary | ICD-10-CM | POA: Diagnosis not present

## 2021-03-31 DIAGNOSIS — I517 Cardiomegaly: Secondary | ICD-10-CM | POA: Diagnosis not present

## 2021-03-31 DIAGNOSIS — R000 Tachycardia, unspecified: Secondary | ICD-10-CM | POA: Diagnosis not present

## 2021-04-01 DIAGNOSIS — I255 Ischemic cardiomyopathy: Secondary | ICD-10-CM | POA: Diagnosis not present

## 2021-04-01 DIAGNOSIS — I119 Hypertensive heart disease without heart failure: Secondary | ICD-10-CM | POA: Diagnosis not present

## 2021-04-01 DIAGNOSIS — I21A1 Myocardial infarction type 2: Secondary | ICD-10-CM | POA: Diagnosis not present

## 2021-04-01 DIAGNOSIS — I358 Other nonrheumatic aortic valve disorders: Secondary | ICD-10-CM | POA: Diagnosis not present

## 2021-04-02 DIAGNOSIS — I119 Hypertensive heart disease without heart failure: Secondary | ICD-10-CM | POA: Diagnosis not present

## 2021-04-02 DIAGNOSIS — I358 Other nonrheumatic aortic valve disorders: Secondary | ICD-10-CM | POA: Diagnosis not present

## 2021-04-02 DIAGNOSIS — R0902 Hypoxemia: Secondary | ICD-10-CM | POA: Diagnosis not present

## 2021-04-02 DIAGNOSIS — I255 Ischemic cardiomyopathy: Secondary | ICD-10-CM | POA: Diagnosis not present

## 2021-04-02 DIAGNOSIS — Z9889 Other specified postprocedural states: Secondary | ICD-10-CM | POA: Diagnosis not present

## 2021-04-02 DIAGNOSIS — I21A1 Myocardial infarction type 2: Secondary | ICD-10-CM | POA: Diagnosis not present

## 2021-04-02 DIAGNOSIS — Z471 Aftercare following joint replacement surgery: Secondary | ICD-10-CM | POA: Diagnosis not present

## 2021-04-02 DIAGNOSIS — Z96641 Presence of right artificial hip joint: Secondary | ICD-10-CM | POA: Diagnosis not present

## 2021-04-03 DIAGNOSIS — I517 Cardiomegaly: Secondary | ICD-10-CM | POA: Diagnosis not present

## 2021-04-03 DIAGNOSIS — I959 Hypotension, unspecified: Secondary | ICD-10-CM | POA: Diagnosis not present

## 2021-04-03 DIAGNOSIS — R0902 Hypoxemia: Secondary | ICD-10-CM | POA: Diagnosis not present

## 2021-04-03 DIAGNOSIS — I2699 Other pulmonary embolism without acute cor pulmonale: Secondary | ICD-10-CM | POA: Diagnosis not present

## 2021-04-03 DIAGNOSIS — R0602 Shortness of breath: Secondary | ICD-10-CM | POA: Diagnosis not present

## 2021-04-12 ENCOUNTER — Encounter (INDEPENDENT_AMBULATORY_CARE_PROVIDER_SITE_OTHER): Payer: Medicare Other | Admitting: Ophthalmology

## 2021-04-18 ENCOUNTER — Telehealth: Payer: Self-pay

## 2021-04-18 NOTE — Telephone Encounter (Signed)
Received notification of Pt's death from [facility]- Pt passed away on May 09, 2021 at 1:25pm.

## 2021-04-19 NOTE — Telephone Encounter (Signed)
Called the patient's wife, unable to reach her, left a message with my condolences

## 2021-04-19 DEATH — deceased

## 2021-06-05 IMAGING — DX DG CHEST 1V PORT
1 series · 1 of 1 positions shown · non-contrast
Comparison: 09/16/2020

CLINICAL DATA: Shortness of breath

EXAM:
PORTABLE CHEST 1 VIEW

[chest ap]
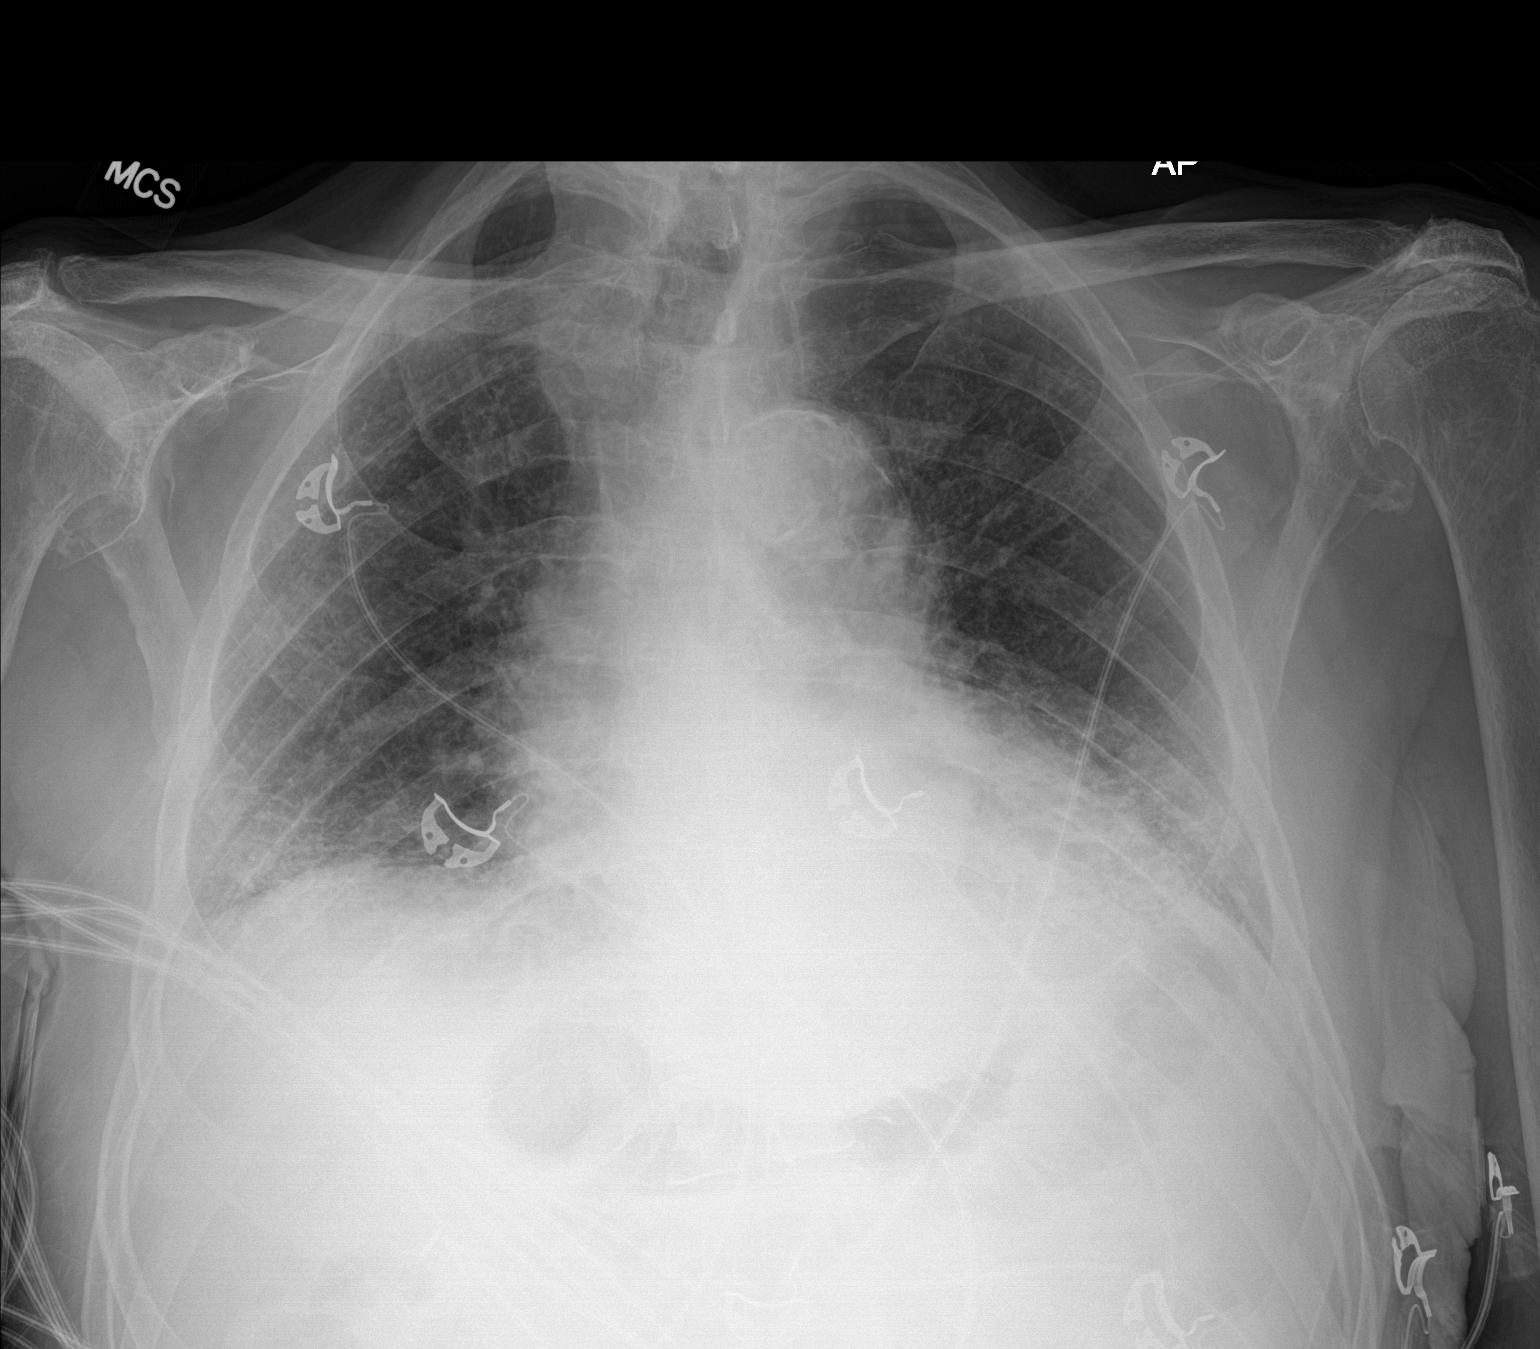

[1 of 1 positions shown; findings below may reference images not displayed]

FINDINGS: Cardiac shadow is enlarged. Aortic calcifications are noted.
Bibasilar fibrotic changes are again noted stable from the prior
exam. No focal infiltrate or effusion is seen. No bony abnormality
is noted.
IMPRESSION: No acute abnormality noted.

## 2021-06-08 ENCOUNTER — Ambulatory Visit: Payer: Medicare Other | Admitting: Cardiology

## 2021-06-16 ENCOUNTER — Ambulatory Visit: Payer: Medicare Other | Admitting: Internal Medicine

## 2021-11-30 ENCOUNTER — Other Ambulatory Visit (HOSPITAL_COMMUNITY): Payer: Self-pay
